# Patient Record
Sex: Female | Born: 1960 | Race: White | Hispanic: No | Marital: Married | Smoking: Never smoker
Health system: Southern US, Community
[De-identification: ages and names within clinical notes are randomized; demographics above are authoritative.]

## PROBLEM LIST (undated history)

## (undated) DIAGNOSIS — C801 Malignant (primary) neoplasm, unspecified: Secondary | ICD-10-CM

## (undated) DIAGNOSIS — H35349 Macular cyst, hole, or pseudohole, unspecified eye: Secondary | ICD-10-CM

## (undated) DIAGNOSIS — Z9889 Other specified postprocedural states: Secondary | ICD-10-CM

## (undated) DIAGNOSIS — E039 Hypothyroidism, unspecified: Secondary | ICD-10-CM

## (undated) DIAGNOSIS — K8689 Other specified diseases of pancreas: Secondary | ICD-10-CM

## (undated) DIAGNOSIS — R112 Nausea with vomiting, unspecified: Secondary | ICD-10-CM

## (undated) DIAGNOSIS — H269 Unspecified cataract: Secondary | ICD-10-CM

## (undated) DIAGNOSIS — Z800 Family history of malignant neoplasm of digestive organs: Secondary | ICD-10-CM

## (undated) DIAGNOSIS — K5790 Diverticulosis of intestine, part unspecified, without perforation or abscess without bleeding: Secondary | ICD-10-CM

## (undated) HISTORY — DX: Family history of malignant neoplasm of digestive organs: Z80.0

## (undated) HISTORY — DX: Macular cyst, hole, or pseudohole, unspecified eye: H35.349

## (undated) HISTORY — PX: CATARACT EXTRACTION, BILATERAL: SHX1313

## (undated) HISTORY — PX: TONSILLECTOMY: SHX5217

## (undated) HISTORY — DX: Hypothyroidism, unspecified: E03.9

## (undated) HISTORY — PX: EYE SURGERY: SHX253

## (undated) HISTORY — PX: COLONOSCOPY: SHX174

## (undated) HISTORY — DX: Diverticulosis of intestine, part unspecified, without perforation or abscess without bleeding: K57.90

## (undated) HISTORY — PX: OTHER SURGICAL HISTORY: SHX169

## (undated) HISTORY — DX: Unspecified cataract: H26.9

## (undated) HISTORY — PX: TONSILLECTOMY: SUR1361

---

## 1997-11-25 ENCOUNTER — Ambulatory Visit (HOSPITAL_COMMUNITY): Admission: RE | Admit: 1997-11-25 | Discharge: 1997-11-25 | Payer: Self-pay | Admitting: *Deleted

## 2001-08-15 ENCOUNTER — Encounter: Payer: Self-pay | Admitting: Family Medicine

## 2001-08-15 ENCOUNTER — Encounter: Admission: RE | Admit: 2001-08-15 | Discharge: 2001-08-15 | Payer: Self-pay | Admitting: Family Medicine

## 2001-12-07 DIAGNOSIS — K5790 Diverticulosis of intestine, part unspecified, without perforation or abscess without bleeding: Secondary | ICD-10-CM

## 2001-12-07 HISTORY — DX: Diverticulosis of intestine, part unspecified, without perforation or abscess without bleeding: K57.90

## 2002-08-16 ENCOUNTER — Encounter: Payer: Self-pay | Admitting: Family Medicine

## 2002-08-16 ENCOUNTER — Encounter: Admission: RE | Admit: 2002-08-16 | Discharge: 2002-08-16 | Payer: Self-pay | Admitting: Family Medicine

## 2005-10-12 ENCOUNTER — Other Ambulatory Visit: Admission: RE | Admit: 2005-10-12 | Discharge: 2005-10-12 | Payer: Self-pay | Admitting: *Deleted

## 2005-10-24 ENCOUNTER — Ambulatory Visit (HOSPITAL_COMMUNITY): Admission: RE | Admit: 2005-10-24 | Discharge: 2005-10-24 | Payer: Self-pay | Admitting: Family Medicine

## 2005-11-23 ENCOUNTER — Ambulatory Visit (HOSPITAL_COMMUNITY): Admission: RE | Admit: 2005-11-23 | Discharge: 2005-11-23 | Payer: Self-pay | Admitting: Family Medicine

## 2006-10-16 ENCOUNTER — Other Ambulatory Visit: Admission: RE | Admit: 2006-10-16 | Discharge: 2006-10-16 | Payer: Self-pay | Admitting: Family Medicine

## 2006-11-09 ENCOUNTER — Encounter: Admission: RE | Admit: 2006-11-09 | Discharge: 2006-11-09 | Payer: Self-pay | Admitting: Family Medicine

## 2007-10-01 DIAGNOSIS — E039 Hypothyroidism, unspecified: Secondary | ICD-10-CM

## 2007-10-01 HISTORY — DX: Hypothyroidism, unspecified: E03.9

## 2007-10-18 ENCOUNTER — Other Ambulatory Visit: Admission: RE | Admit: 2007-10-18 | Discharge: 2007-10-18 | Payer: Self-pay | Admitting: Gynecology

## 2007-11-12 ENCOUNTER — Encounter: Admission: RE | Admit: 2007-11-12 | Discharge: 2007-11-12 | Payer: Self-pay | Admitting: Gynecology

## 2008-04-22 ENCOUNTER — Ambulatory Visit: Payer: Self-pay | Admitting: Gynecology

## 2008-06-03 ENCOUNTER — Ambulatory Visit: Payer: Self-pay | Admitting: Gynecology

## 2008-06-11 ENCOUNTER — Ambulatory Visit (HOSPITAL_COMMUNITY): Admission: RE | Admit: 2008-06-11 | Discharge: 2008-06-11 | Payer: Self-pay | Admitting: Gynecology

## 2008-10-28 ENCOUNTER — Ambulatory Visit: Payer: Self-pay | Admitting: Gynecology

## 2008-10-28 ENCOUNTER — Other Ambulatory Visit: Admission: RE | Admit: 2008-10-28 | Discharge: 2008-10-28 | Payer: Self-pay | Admitting: Gynecology

## 2008-10-28 ENCOUNTER — Encounter: Payer: Self-pay | Admitting: Gynecology

## 2008-11-12 ENCOUNTER — Ambulatory Visit (HOSPITAL_COMMUNITY): Admission: RE | Admit: 2008-11-12 | Discharge: 2008-11-12 | Payer: Self-pay | Admitting: Gynecology

## 2009-04-27 ENCOUNTER — Ambulatory Visit: Payer: Self-pay | Admitting: Gynecology

## 2009-04-27 ENCOUNTER — Other Ambulatory Visit: Admission: RE | Admit: 2009-04-27 | Discharge: 2009-04-27 | Payer: Self-pay | Admitting: Gynecology

## 2009-10-29 ENCOUNTER — Other Ambulatory Visit: Admission: RE | Admit: 2009-10-29 | Discharge: 2009-10-29 | Payer: Self-pay | Admitting: Gynecology

## 2009-10-29 ENCOUNTER — Ambulatory Visit: Payer: Self-pay | Admitting: Gynecology

## 2009-11-13 ENCOUNTER — Ambulatory Visit (HOSPITAL_COMMUNITY): Admission: RE | Admit: 2009-11-13 | Discharge: 2009-11-13 | Payer: Self-pay | Admitting: Gynecology

## 2009-11-17 ENCOUNTER — Encounter: Admission: RE | Admit: 2009-11-17 | Discharge: 2009-11-17 | Payer: Self-pay | Admitting: Gynecology

## 2010-11-01 ENCOUNTER — Encounter: Payer: Self-pay | Admitting: Gynecology

## 2010-11-02 ENCOUNTER — Encounter: Payer: Self-pay | Admitting: Gynecology

## 2010-11-08 ENCOUNTER — Encounter (INDEPENDENT_AMBULATORY_CARE_PROVIDER_SITE_OTHER): Payer: BC Managed Care – PPO | Admitting: Gynecology

## 2010-11-08 ENCOUNTER — Other Ambulatory Visit: Payer: Self-pay | Admitting: Gynecology

## 2010-11-08 ENCOUNTER — Other Ambulatory Visit (HOSPITAL_COMMUNITY)
Admission: RE | Admit: 2010-11-08 | Discharge: 2010-11-08 | Disposition: A | Discharge status: Home / Self Care | Payer: BC Managed Care – PPO | Source: Ambulatory Visit | Attending: Gynecology | Admitting: Gynecology

## 2010-11-08 DIAGNOSIS — Z01419 Encounter for gynecological examination (general) (routine) without abnormal findings: Secondary | ICD-10-CM

## 2010-11-08 DIAGNOSIS — Z124 Encounter for screening for malignant neoplasm of cervix: Secondary | ICD-10-CM | POA: Insufficient documentation

## 2010-11-08 DIAGNOSIS — Z13220 Encounter for screening for lipoid disorders: Secondary | ICD-10-CM

## 2010-11-08 DIAGNOSIS — Z1231 Encounter for screening mammogram for malignant neoplasm of breast: Secondary | ICD-10-CM

## 2010-11-08 DIAGNOSIS — E039 Hypothyroidism, unspecified: Secondary | ICD-10-CM

## 2010-11-08 DIAGNOSIS — R82998 Other abnormal findings in urine: Secondary | ICD-10-CM

## 2010-11-19 ENCOUNTER — Ambulatory Visit (HOSPITAL_COMMUNITY): Payer: BC Managed Care – PPO

## 2010-11-30 ENCOUNTER — Ambulatory Visit (HOSPITAL_COMMUNITY): Payer: BC Managed Care – PPO

## 2010-12-01 ENCOUNTER — Ambulatory Visit (HOSPITAL_COMMUNITY)
Admission: RE | Admit: 2010-12-01 | Discharge: 2010-12-01 | Disposition: A | Discharge status: Home / Self Care | Payer: BC Managed Care – PPO | Source: Ambulatory Visit | Attending: Gynecology | Admitting: Gynecology

## 2010-12-01 DIAGNOSIS — Z1231 Encounter for screening mammogram for malignant neoplasm of breast: Secondary | ICD-10-CM | POA: Insufficient documentation

## 2011-07-28 ENCOUNTER — Other Ambulatory Visit: Payer: Self-pay | Admitting: Gynecology

## 2011-10-23 ENCOUNTER — Other Ambulatory Visit: Payer: Self-pay | Admitting: Gynecology

## 2011-10-31 ENCOUNTER — Other Ambulatory Visit: Payer: Self-pay | Admitting: Gynecology

## 2011-10-31 DIAGNOSIS — Z1231 Encounter for screening mammogram for malignant neoplasm of breast: Secondary | ICD-10-CM

## 2011-11-09 ENCOUNTER — Ambulatory Visit (INDEPENDENT_AMBULATORY_CARE_PROVIDER_SITE_OTHER): Payer: BC Managed Care – PPO | Admitting: Gynecology

## 2011-11-09 ENCOUNTER — Encounter: Payer: Self-pay | Admitting: Gynecology

## 2011-11-09 ENCOUNTER — Encounter: Payer: BC Managed Care – PPO | Admitting: Gynecology

## 2011-11-09 VITALS — BP 122/66 | Ht 66.0 in | Wt 140.0 lb

## 2011-11-09 DIAGNOSIS — Z309 Encounter for contraceptive management, unspecified: Secondary | ICD-10-CM

## 2011-11-09 DIAGNOSIS — E039 Hypothyroidism, unspecified: Secondary | ICD-10-CM

## 2011-11-09 DIAGNOSIS — Z01419 Encounter for gynecological examination (general) (routine) without abnormal findings: Secondary | ICD-10-CM

## 2011-11-09 LAB — CBC WITH DIFFERENTIAL/PLATELET
Basophils Absolute: 0 10*3/uL (ref 0.0–0.1)
Hemoglobin: 13.2 g/dL (ref 12.0–15.0)
Lymphocytes Relative: 28 % (ref 12–46)
MCV: 89.7 fL (ref 78.0–100.0)
Monocytes Absolute: 0.6 10*3/uL (ref 0.1–1.0)
Monocytes Relative: 9 % (ref 3–12)
Platelets: 251 10*3/uL (ref 150–400)
RBC: 4.36 MIL/uL (ref 3.87–5.11)
WBC: 6.4 10*3/uL (ref 4.0–10.5)

## 2011-11-09 LAB — FOLLICLE STIMULATING HORMONE: FSH: 22.7 m[IU]/mL

## 2011-11-09 MED ORDER — LEVOTHYROXINE SODIUM 50 MCG PO TABS: 50.0000 ug | ORAL_TABLET | Freq: Every day | ORAL | Status: DC

## 2011-11-09 MED ORDER — NORGESTIMATE-ETH ESTRADIOL 0.25-35 MG-MCG PO TABS: 1.0000 | ORAL_TABLET | Freq: Every day | ORAL | Status: DC

## 2011-11-09 NOTE — Progress Notes (Addendum)
Breanna Noble 14-Jun-1960 [id number]        51 y.o.  for annual exam.  Doing well without complaints.  Past medical history,surgical history, medications, allergies, family history and social history were all reviewed and documented in the EPIC chart. ROS:  Was performed and pertinent positives and negatives are included in the history.  Exam: Breanna Noble assistant Filed Vitals:   11/09/11 1131  BP: 122/66   General appearance  Normal Skin grossly normal Head/Neck normal with no cervical or supraclavicular adenopathy thyroid normal Lungs  clear Cardiac RR, without RMG Abdominal  soft, nontender, without masses, organomegaly or hernia Breasts  examined lying and sitting without masses, retractions, discharge or axillary adenopathy. Pelvic  Ext/BUS/vagina  normal   Cervix  normal   Uterus  anteverted, normal size, shape and contour, midline and mobile nontender   Adnexa  Without masses or tenderness    Anus and perineum  normal   Rectovaginal  normal sphincter tone without palpated masses or tenderness.    Assessment/Plan:  51 y.o. female for annual exam.    1. Contraceptive management. Patient continues to have regular menses with birth control pills. She's not having any menopausal symptoms to include no hot flashes/night sweats/sleep disturbances during her pill free week. Options for stopping oral contraceptives now using backup contraception and monitoring versus switching to a different contraception such as IUD versus continuing on oral contraceptives reviewed. Risks to include stroke, heart attack, DVT discussed. Patient wants to continue on her oral contraceptives now and I refilled her times a year. I am going to check an [facility] now, assuming normal plan discontinuing next year at age 34 and going from there. 2. Hypothyroidism. Patient's on Synthroid 50 mcg daily. We'll check TSH and refill her times a year. 3. Pap smear. Last Pap smear 2012. No Pap smear done today. Patient has no history  of abnormal Pap smears with multiple normal reports in her chart. Reviewed current screening guidelines we'll plan plan less frequent screening every 3-5 years. 4. Mammography. Patient has her mammogram scheduled next month. She'll continue with annual mammography. SBE monthly reviewed. 5. Colonoscopy. Patient turns 51 this year has never had colonoscopy. Recommended screening colonoscopy. Her husband sees Dr. Evette Cristal I recommended she call his office to arrange colonoscopy and she agrees to do so. 6. Health maintenance. Will check baseline CBC, urinalysis along with her FSH and TSH. A glucose and lipid profile were normal 2012 and I did not repeat those this year.    Breanna Lords MD, 12:11 PM 11/09/2011

## 2011-11-09 NOTE — Patient Instructions (Signed)
Follow up in one year for annual gynecologic exam. 

## 2011-11-10 LAB — URINALYSIS W MICROSCOPIC + REFLEX CULTURE
Bacteria, UA: NONE SEEN
Crystals: NONE SEEN
Glucose, UA: NEGATIVE mg/dL
Hgb urine dipstick: NEGATIVE
Ketones, ur: NEGATIVE mg/dL
Leukocytes, UA: NEGATIVE
Squamous Epithelial / LPF: NONE SEEN
pH: 7 (ref 5.0–8.0)

## 2011-12-02 ENCOUNTER — Ambulatory Visit (HOSPITAL_COMMUNITY)
Admission: RE | Admit: 2011-12-02 | Discharge: 2011-12-02 | Disposition: A | Discharge status: Home / Self Care | Payer: BC Managed Care – PPO | Source: Ambulatory Visit | Attending: Gynecology | Admitting: Gynecology

## 2011-12-02 DIAGNOSIS — Z1231 Encounter for screening mammogram for malignant neoplasm of breast: Secondary | ICD-10-CM | POA: Insufficient documentation

## 2012-06-08 ENCOUNTER — Encounter: Payer: Self-pay | Admitting: Family Medicine

## 2012-09-25 ENCOUNTER — Telehealth: Payer: Self-pay | Admitting: Family Medicine

## 2012-10-09 NOTE — Telephone Encounter (Signed)
Error

## 2012-10-17 ENCOUNTER — Other Ambulatory Visit: Payer: Self-pay | Admitting: Gynecology

## 2012-11-09 ENCOUNTER — Encounter: Payer: Self-pay | Admitting: Gynecology

## 2012-11-09 ENCOUNTER — Ambulatory Visit (INDEPENDENT_AMBULATORY_CARE_PROVIDER_SITE_OTHER): Payer: BC Managed Care – PPO | Admitting: Gynecology

## 2012-11-09 VITALS — BP 122/74 | Ht 65.0 in | Wt 137.0 lb

## 2012-11-09 DIAGNOSIS — Z01419 Encounter for gynecological examination (general) (routine) without abnormal findings: Secondary | ICD-10-CM

## 2012-11-09 DIAGNOSIS — Z309 Encounter for contraceptive management, unspecified: Secondary | ICD-10-CM

## 2012-11-09 MED ORDER — NORGESTIMATE-ETH ESTRADIOL 0.25-35 MG-MCG PO TABS: ORAL_TABLET | ORAL | Status: DC

## 2012-11-09 NOTE — Progress Notes (Signed)
Breanna Noble 1960/08/18 [id number]        52 y.o.  G2P2 for annual exam.  Doing well without complaints.  Past medical history,surgical history, medications, allergies, family history and social history were all reviewed and documented in the EPIC chart.  ROS:  Performed and pertinent positives and negatives are included in the history, assessment and plan .  Exam: Biomedical scientist Filed Vitals:   11/09/12 0942  BP: 122/74  Height: 5\' 5"  (1.651 m)  Weight: 137 lb (62.143 kg)   General appearance  Normal Skin grossly normal Head/Neck normal with no cervical or supraclavicular adenopathy thyroid normal Lungs  clear Cardiac RR, without RMG Abdominal  soft, nontender, without masses, organomegaly or hernia Breasts  examined lying and sitting without masses, retractions, discharge or axillary adenopathy. Pelvic  Ext/BUS/vagina  normal   Cervix  normal   Uterus  anteverted, normal size, shape and contour, midline and mobile nontender   Adnexa  Without masses or tenderness    Anus and perineum  normal   Rectovaginal  normal sphincter tone without palpated masses or tenderness.    Assessment/Plan:  52 y.o. G2P2 female for annual exam.   1. Contraceptive management. Patient continues on oral contraceptives with regular menses. No hot flushes night sweats or other menopausal symptoms during her pill free week. Options for management include stopping now and monitoring menses rechecking FSH. Rechecking FSH during pill free week. Continuing on BCPs one more year and then addressing next year. The risks of BCPs with advancing age to include stroke heart attack DVT discussed. She's not been followed for medical issues such as hypertension diabetes. Patient would prefer to continue for another year and then address at that time which I think is reasonable. I refilled her pills times a year. 2. Mammography due next month. Patient is to schedule. SBE monthly reviewed. 3. Pap smear 2012. No Pap smear  done today. No history of abnormal Pap smears previously. Plan repeat next year a 3 year interval. 4. Health maintenance. Patient recently saw her primary physician for an annual exam who has taken over her thyroid management. Recent TSH was normal. He also ordered routine blood work and no blood was done today. Followup in one year, sooner as needed.  Note: This document was prepared with digital dictation and possible smart phrase technology. Any transcriptional errors that result from this process are unintentional.   Dara Lords MD, 10:10 AM 11/09/2012

## 2012-11-09 NOTE — Patient Instructions (Signed)
Follow up in one year, sooner as needed. 

## 2012-11-12 ENCOUNTER — Other Ambulatory Visit: Payer: Self-pay | Admitting: Gynecology

## 2012-11-12 DIAGNOSIS — Z1231 Encounter for screening mammogram for malignant neoplasm of breast: Secondary | ICD-10-CM

## 2012-12-03 ENCOUNTER — Other Ambulatory Visit: Payer: Self-pay | Admitting: Gynecology

## 2012-12-05 ENCOUNTER — Ambulatory Visit (HOSPITAL_COMMUNITY)
Admission: RE | Admit: 2012-12-05 | Discharge: 2012-12-05 | Disposition: A | Discharge status: Home / Self Care | Payer: BC Managed Care – PPO | Source: Ambulatory Visit | Attending: Gynecology | Admitting: Gynecology

## 2012-12-05 DIAGNOSIS — Z1231 Encounter for screening mammogram for malignant neoplasm of breast: Secondary | ICD-10-CM | POA: Insufficient documentation

## 2012-12-10 ENCOUNTER — Ambulatory Visit (INDEPENDENT_AMBULATORY_CARE_PROVIDER_SITE_OTHER): Payer: BC Managed Care – PPO | Admitting: Family Medicine

## 2012-12-10 ENCOUNTER — Encounter: Payer: Self-pay | Admitting: Family Medicine

## 2012-12-10 VITALS — BP 115/65 | HR 56 | Temp 97.9°F | Ht 65.5 in | Wt 137.6 lb

## 2012-12-10 DIAGNOSIS — K5790 Diverticulosis of intestine, part unspecified, without perforation or abscess without bleeding: Secondary | ICD-10-CM | POA: Insufficient documentation

## 2012-12-10 DIAGNOSIS — E039 Hypothyroidism, unspecified: Secondary | ICD-10-CM

## 2012-12-10 DIAGNOSIS — Z0000 Encounter for general adult medical examination without abnormal findings: Secondary | ICD-10-CM

## 2012-12-10 DIAGNOSIS — K5730 Diverticulosis of large intestine without perforation or abscess without bleeding: Secondary | ICD-10-CM

## 2012-12-10 DIAGNOSIS — Z119 Encounter for screening for infectious and parasitic diseases, unspecified: Secondary | ICD-10-CM | POA: Insufficient documentation

## 2012-12-10 NOTE — Patient Instructions (Addendum)
HEALTH MAINTENANCE Immunizations: Tetanus-Diphtheria Booster [phone] Pertusis Booster [phone] Flu Shot Due: every Fall Pneumonia Vaccine: usually at 51 years of age unless there are certain risk situations. Herpes Zoster/Shingles Vaccine due: usually at 51 years of age HPV XBJ:YNWG age 89 to 37 years in males and females.  Healthy Life Habits: Exercise Goal: 5-6 days/week; start gradually(ie 30 minutes/3days per week) Nutrition: Balanced healthy meals including Vegetables and Fruits. Consider  Reading the following books: 1) Eat to Live by Dr Ottis Stain; 2) Prevent and Reverse Heart Disease by Dr Suzzette Righter.  Vitamins:okay Aspirin:81 mg Stop Tobacco Use:try to avoid second hand  Smoke. Seat Belt Use:+++ recommended Sunscreen Use:+++ recommended Osteoporosis Prevention: 1) Exercise 2) Calcium/Vitamin D requirements:he [employer] of the [employer] recommends:    Calcium:  800 mg/day for children 45-16 years of age          51 mg/day for children 87-61 years of age          51 mg/day for adults 71-44 years of age          51 mg/day for everyone more than 51 years of age     Vitamin D: 800 IU per day or as prescribed if you are deficient.  Recommended Screening Tests: Colon Cancer Screening: done 2013 next is 2023 Blood work: today Cholesterol Screening:today            HIV: n/a                   Hepatitis C(people born 1945-1965): today  Mammogram: last Wednesday DEXA/Bone Density: at 56 years with the Gynecologist GYN Exam:with Dr Audie Box Monthly Self Breast Exam:++++  Eye Exam: every 1 to 2 years recommended Dental Health: at least every 6 months  Others:    Living Will/Healthcare Power of Attorney: should have this in order with your personal estate planning

## 2012-12-10 NOTE — Progress Notes (Signed)
Patient ID: Breanna Noble, female   DOB: 02-06-1961, 52 y.o.   MRN: [id number] SUBJECTIVE: CC: Chief Complaint  Patient presents with  . Annual Exam    cpx no pap  never had cxr but around people who have smoked     HPI: Second hand smoke. Gyn Dr Audie Box in [place]. Had her GYN exam. Mammogram last Wednesday.  Past Medical History  Diagnosis Date  . Hypothyroid 10/2007  . Diverticulosis 12/07/2001    found on colonoscopy   Past Surgical History  Procedure Laterality Date  . Tonsillectomy     History   Social History  . Marital Status: Married    Spouse Name: N/A    Number of Children: N/A  . Years of Education: N/A   Occupational History  . Not on file.   Social History Main Topics  . Smoking status: Never Smoker   . Smokeless tobacco: Not on file  . Alcohol Use: No  . Drug Use: No  . Sexually Active: Yes    Birth Control/ Protection: Pill   Other Topics Concern  . Not on file   Social History Narrative  . No narrative on file   Family History  Problem Relation Age of Onset  . Hypertension Father   . Heart disease Father   . Cancer Maternal Grandmother     PANCREATIC  . Heart disease Maternal Grandfather   . Diabetes Paternal Grandfather   . Cancer Paternal Uncle     COLON CANCER IN HIS 50'S   Current Outpatient Prescriptions on File Prior to Visit  Medication Sig Dispense Refill  . Ascorbic Acid (VITAMIN C PO) Take by mouth. 1000MG  A DAY      . aspirin 81 MG tablet Take 81 mg by mouth daily.        . fish oil-omega-3 fatty acids 1000 MG capsule Take 2 g by mouth daily.      Marland Kitchen levothyroxine (SYNTHROID, LEVOTHROID) 50 MCG tablet Take 1 tablet (50 mcg total) by mouth daily.  90 tablet  4  . norgestimate-ethinyl estradiol (SPRINTEC 28) 0.25-35 MG-MCG tablet TAKE ONE TABLET BY MOUTH EVERY DAY  1 Package  11  . Cholecalciferol (VITAMIN D PO) Take by mouth. 2000 UNITS A DAY      . Multiple Vitamin (MULTIVITAMIN PO) Take by mouth.         No current  facility-administered medications on file prior to visit.   No Known Allergies Immunization History  Administered Date(s) Administered  . Tdap 01/29/2011   Prior to Admission medications   Medication Sig Start Date End Date Taking? Authorizing Provider  Ascorbic Acid (VITAMIN C PO) Take by mouth. 1000MG  A DAY   Yes Historical Provider, MD  aspirin 81 MG tablet Take 81 mg by mouth daily.     Yes Historical Provider, MD  fish oil-omega-3 fatty acids 1000 MG capsule Take 2 g by mouth daily.   Yes Historical Provider, MD  levothyroxine (SYNTHROID, LEVOTHROID) 50 MCG tablet Take 1 tablet (50 mcg total) by mouth daily. 11/09/11  Yes Dara Lords, MD  norgestimate-ethinyl estradiol (SPRINTEC 28) 0.25-35 MG-MCG tablet TAKE ONE TABLET BY MOUTH EVERY DAY 11/09/12  Yes Dara Lords, MD  Cholecalciferol (VITAMIN D PO) Take by mouth. 2000 UNITS A DAY    Historical Provider, MD  Multiple Vitamin (MULTIVITAMIN PO) Take by mouth.      Historical Provider, MD     ROS: As above in the HPI. All other systems are stable or  negative.  OBJECTIVE: APPEARANCE:  Patient in no acute distress.The patient appeared well nourished and normally developed. Acyanotic. Waist: VITAL SIGNS:BP 115/65  Pulse 56  Temp(Src) 97.9 F (36.6 C) (Other (Comment))  Ht 5' 5.5" (1.664 m)  Wt 137 lb 9.6 oz (62.415 kg)  BMI 22.54 kg/m2  LMP 11/23/2012  WF looks well  SKIN: warm and  Dry without overt rashes, tattoos and scars  HEAD and Neck: without JVD, Head and scalp: normal Eyes:No scleral icterus. Fundi normal, eye movements normal. Ears: Auricle normal, canal normal, Tympanic membranes normal, insufflation normal. Nose: normal Throat: normal Neck & thyroid: normal  CHEST & LUNGS: Chest wall: normal Lungs: Clear  CVS: Reveals the PMI to be normally located. Regular rhythm, First and Second Heart sounds are normal,  absence of murmurs, rubs or gallops. Peripheral vasculature: Radial pulses:  normal Dorsal pedis pulses: normal Posterior pulses: normal  ABDOMEN:  Appearance: normal Benign, no organomegaly, no masses, no Abdominal Aortic enlargement. No Guarding , no rebound. No Bruits. Bowel sounds: normal  RECTAL: N/A GU: N/A  EXTREMETIES: nonedematous. Both Femoral and Pedal pulses are normal.  MUSCULOSKELETAL:  Spine: normal Joints: intact  NEUROLOGIC: oriented to time,place and person; nonfocal. Strength is normal Sensory is normal Reflexes are normal Cranial Nerves are normal.  ASSESSMENT: Annual physical exam - Plan: CMP14+EGFR, Lipid panel, CBC With differential/Platelet  Hypothyroid - Plan: Thyroid Panel With TSH  Diverticulosis - history of  Screening examination for infectious disease - Plan: Hepatitis C antibody  In good state of health  PLAN:       HEALTH MAINTENANCE Immunizations: Tetanus-Diphtheria Booster [id number] Pertusis Booster [id number] Flu Shot Due: every Fall Pneumonia Vaccine: usually at 51 years of age unless there are certain risk situations. Herpes Zoster/Shingles Vaccine due: usually at 51 years of age HPV XBJ:YNWG age 22 to 62 years in males and females.  Healthy Life Habits: Exercise Goal: 5-6 days/week; start gradually(ie 30 minutes/3days per week) Nutrition: Balanced healthy meals including Vegetables and Fruits. Consider  Reading the following books: 1) Eat to Live by Dr Ottis Stain; 2) Prevent and Reverse Heart Disease by Dr Suzzette Righter.  Vitamins:okay Aspirin:81 mg Stop Tobacco Use:try to avoid second hand  Smoke. Seat Belt Use:+++ recommended Sunscreen Use:+++ recommended Osteoporosis Prevention: 1) Exercise 2) Calcium/Vitamin D requirements:he [employer] of the [employer] recommends:    Calcium:  800 mg/day for children 39-80 years of age          51 mg/day for children 62-66 years of age          51 mg/day for adults 44-39 years of age          51 mg/day for everyone  more than 51 years of age     Vitamin D: 800 IU per day or as prescribed if you are deficient.  Recommended Screening Tests: Colon Cancer Screening: done 2013 next is 2023 Blood work: today Cholesterol Screening:today            HIV: n/a                   Hepatitis C(people born 1945-1965): today  Mammogram: last Wednesday DEXA/Bone Density: at 56 years with the Gynecologist GYN Exam:with Dr Audie Box Monthly Self Breast Exam:++++  Eye Exam: every 1 to 2 years recommended Dental Health: at least every 6 months  Others:    Living Will/Healthcare Power of Attorney: should have this in order with your personal estate planning   Orders  Placed This Encounter  Procedures  . CMP14+EGFR  . Thyroid Panel With TSH  . Lipid panel  . CBC With differential/Platelet  . Hepatitis C antibody   Return in about 6 months (around 06/12/2013) for recheck thyroid.  Juliany Daughety P. [place] Charon, M.D.

## 2012-12-11 LAB — CBC WITH DIFFERENTIAL
Basophils Absolute: 0 10*3/uL (ref 0.0–0.2)
Basos: 0 % (ref 0–3)
Eos: 1 % (ref 0–5)
Eosinophils Absolute: 0.1 10*3/uL (ref 0.0–0.4)
HCT: 41.1 % (ref 34.0–46.6)
Hemoglobin: 14 g/dL (ref 11.1–15.9)
Immature Grans (Abs): 0 10*3/uL (ref 0.0–0.1)
Immature Granulocytes: 0 % (ref 0–2)
Lymphocytes Absolute: 2.1 10*3/uL (ref 0.7–3.1)
Lymphs: 31 % (ref 14–46)
MCH: 30.4 pg (ref 26.6–33.0)
MCHC: 34.1 g/dL (ref 31.5–35.7)
MCV: 89 fL (ref 79–97)
Monocytes Absolute: 0.6 10*3/uL (ref 0.1–0.9)
Monocytes: 8 % (ref 4–12)
Neutrophils Absolute: 4.2 10*3/uL (ref 1.4–7.0)
Neutrophils Relative %: 60 % (ref 40–74)
Platelets: 278 10*3/uL (ref 150–379)
RBC: 4.6 x10E6/uL (ref 3.77–5.28)
RDW: 14 % (ref 12.3–15.4)
WBC: 7 10*3/uL (ref 3.4–10.8)

## 2012-12-11 LAB — LIPID PANEL
Chol/HDL Ratio: 2.9 ratio units (ref 0.0–4.4)
Cholesterol, Total: 177 mg/dL (ref 100–199)
HDL: 62 mg/dL (ref >39)
LDL Calculated: 86 mg/dL (ref 0–99)
Triglycerides: 147 mg/dL (ref 0–149)
VLDL Cholesterol Cal: 29 mg/dL (ref 5–40)

## 2012-12-11 LAB — CMP14+EGFR
ALT: 18 IU/L (ref 0–32)
AST: 19 IU/L (ref 0–40)
Albumin/Globulin Ratio: 1.9 (ref 1.1–2.5)
Albumin: 4.5 g/dL (ref 3.5–5.5)
Alkaline Phosphatase: 41 IU/L (ref 39–117)
BUN/Creatinine Ratio: 9 (ref 9–23)
BUN: 6 mg/dL (ref 6–24)
CO2: 25 mmol/L (ref 18–29)
Calcium: 9.3 mg/dL (ref 8.7–10.2)
Chloride: 99 mmol/L (ref 97–108)
Creatinine, Ser: 0.66 mg/dL (ref 0.57–1.00)
GFR calc Af Amer: 118 mL/min/1.73
GFR calc non Af Amer: 103 mL/min/1.73
Globulin, Total: 2.4 g/dL (ref 1.5–4.5)
Glucose: 89 mg/dL (ref 65–99)
Potassium: 4.4 mmol/L (ref 3.5–5.2)
Sodium: 139 mmol/L (ref 134–144)
Total Bilirubin: 0.4 mg/dL (ref 0.0–1.2)
Total Protein: 6.9 g/dL (ref 6.0–8.5)

## 2012-12-11 LAB — THYROID PANEL WITH TSH
Free Thyroxine Index: 2.6 (ref 1.2–4.9)
T3 Uptake Ratio: 22 % — ABNORMAL LOW (ref 24–39)
T4, Total: 12 ug/dL (ref 4.5–12.0)
TSH: 3.47 u[IU]/mL (ref 0.450–4.500)

## 2012-12-11 LAB — HEPATITIS C ANTIBODY: Hep C Virus Ab: <0.1 s/co ratio (ref 0.0–0.9)

## 2012-12-12 NOTE — Progress Notes (Signed)
Quick Note:  Call patient. Labs normal. No change in plan. ______ 

## 2012-12-13 ENCOUNTER — Encounter: Payer: BC Managed Care – PPO | Admitting: Family Medicine

## 2013-01-01 ENCOUNTER — Other Ambulatory Visit: Payer: Self-pay | Admitting: Family Medicine

## 2013-01-01 DIAGNOSIS — E039 Hypothyroidism, unspecified: Secondary | ICD-10-CM

## 2013-01-01 MED ORDER — LEVOTHYROXINE SODIUM 50 MCG PO TABS: 50.0000 ug | ORAL_TABLET | Freq: Every day | ORAL | Status: DC

## 2013-03-07 ENCOUNTER — Other Ambulatory Visit: Payer: Self-pay

## 2013-06-13 ENCOUNTER — Encounter: Payer: Self-pay | Admitting: Family Medicine

## 2013-06-13 ENCOUNTER — Ambulatory Visit (INDEPENDENT_AMBULATORY_CARE_PROVIDER_SITE_OTHER): Payer: BC Managed Care – PPO | Admitting: Family Medicine

## 2013-06-13 VITALS — BP 98/64 | HR 67 | Temp 98.3°F | Ht 65.5 in | Wt 134.8 lb

## 2013-06-13 DIAGNOSIS — H6980 Other specified disorders of Eustachian tube, unspecified ear: Secondary | ICD-10-CM

## 2013-06-13 DIAGNOSIS — E039 Hypothyroidism, unspecified: Secondary | ICD-10-CM

## 2013-06-13 DIAGNOSIS — H6990 Unspecified Eustachian tube disorder, unspecified ear: Secondary | ICD-10-CM

## 2013-06-13 MED ORDER — LEVOTHYROXINE SODIUM 50 MCG PO TABS: 50.0000 ug | ORAL_TABLET | Freq: Every day | ORAL | Status: AC

## 2013-06-13 NOTE — Progress Notes (Signed)
Patient ID: Breanna Noble, female   DOB: 14-Jun-1960, 53 y.o.   MRN: [id number] SUBJECTIVE: CC: Chief Complaint  Patient presents with  . Follow-up    no complaints ck right ear refill thyroid med wants 90 day supply    HPI: Here to recheck her thyroid levels. Doing fine and feeling fine and is vegetarian. The right ear drum pops at times and is stuffed up.   Past Medical History  Diagnosis Date  . Hypothyroid 10/2007  . Diverticulosis 12/07/2001    found on colonoscopy   Past Surgical History  Procedure Laterality Date  . Tonsillectomy     History   Social History  . Marital Status: Married    Spouse Name: N/A    Number of Children: N/A  . Years of Education: N/A   Occupational History  . Not on file.   Social History Main Topics  . Smoking status: Never Smoker   . Smokeless tobacco: Not on file  . Alcohol Use: No  . Drug Use: No  . Sexual Activity: Yes    Birth Control/ Protection: Pill   Other Topics Concern  . Not on file   Social History Narrative  . No narrative on file   Family History  Problem Relation Age of Onset  . Hypertension Father   . Heart disease Father   . Cancer Maternal Grandmother     PANCREATIC  . Heart disease Maternal Grandfather   . Diabetes Paternal Grandfather   . Cancer Paternal Uncle     COLON CANCER IN HIS 50'S   Current Outpatient Prescriptions on File Prior to Visit  Medication Sig Dispense Refill  . Ascorbic Acid (VITAMIN C PO) Take by mouth. 1000MG  A DAY      . aspirin 81 MG tablet Take 81 mg by mouth daily.        . Cholecalciferol (VITAMIN D PO) Take by mouth. 2000 UNITS A DAY      . fish oil-omega-3 fatty acids 1000 MG capsule Take 2 g by mouth daily.      . Multiple Vitamin (MULTIVITAMIN PO) Take by mouth.        . norgestimate-ethinyl estradiol (SPRINTEC 28) 0.25-35 MG-MCG tablet TAKE ONE TABLET BY MOUTH EVERY DAY  1 Package  11   No current facility-administered medications on file prior to visit.   No Known  Allergies Immunization History  Administered Date(s) Administered  . Tdap 01/29/2011   Prior to Admission medications   Medication Sig Start Date End Date Taking? Authorizing Provider  Ascorbic Acid (VITAMIN C PO) Take by mouth. 1000MG  A DAY   Yes Historical Provider, MD  aspirin 81 MG tablet Take 81 mg by mouth daily.     Yes Historical Provider, MD  Cholecalciferol (VITAMIN D PO) Take by mouth. 2000 UNITS A DAY   Yes Historical Provider, MD  fish oil-omega-3 fatty acids 1000 MG capsule Take 2 g by mouth daily.   Yes Historical Provider, MD  levothyroxine (SYNTHROID, LEVOTHROID) 50 MCG tablet Take 1 tablet (50 mcg total) by mouth daily. 01/01/13  Yes Vernie Shanks, MD  Multiple Vitamin (MULTIVITAMIN PO) Take by mouth.     Yes Historical Provider, MD  norgestimate-ethinyl estradiol ([place] 28) 0.25-35 MG-MCG tablet TAKE ONE TABLET BY MOUTH EVERY DAY 11/09/12  Yes Timothy P Fontaine, MD     ROS: As above in the HPI. All other systems are stable or negative.  OBJECTIVE: APPEARANCE:  Patient in no acute distress.The patient appeared well nourished  and normally developed. Acyanotic. Waist: VITAL SIGNS:BP 98/64  Pulse 67  Temp(Src) 98.3 F (36.8 C) (Oral)  Ht 5' 5.5" (1.664 m)  Wt 134 lb 12.8 oz (61.145 kg)  BMI 22.08 kg/m2  LMP 06/07/2013  WF looks well  SKIN: warm and  Dry without overt rashes, tattoos and scars  HEAD and Neck: without JVD, Head and scalp: normal Eyes:No scleral icterus. Fundi normal, eye movements normal. Ears: Auricle normal, canal normal, Tympanic membranes normal, insufflation poor on the right. Nose: normal Throat: normal Neck & thyroid: normal  CHEST & LUNGS: Chest wall: normal Lungs: Clear  CVS: Reveals the PMI to be normally located. Regular rhythm, First and Second Heart sounds are normal,  absence of murmurs, rubs or gallops. Peripheral vasculature: Radial pulses: normal Dorsal pedis pulses: normal Posterior pulses: normal  ABDOMEN:   Appearance: normal Benign, no organomegaly, no masses, no Abdominal Aortic enlargement. No Guarding , no rebound. No Bruits. Bowel sounds: normal  RECTAL: N/A GU: N/A  EXTREMETIES: nonedematous.  MUSCULOSKELETAL:  Spine: normal Joints: intact  NEUROLOGIC: oriented to time,place and person; nonfocal. Strength is normal Sensory is normal Reflexes are normal Cranial Nerves are normal.  ASSESSMENT:  Hypothyroid - Plan: levothyroxine (SYNTHROID, LEVOTHROID) 50 MCG tablet, TSH  ETD (eustachian tube dysfunction)  PLAN: Handout on ET dysfunction. Try chewing gum, etc.to relieve ear pressure.  Orders Placed This Encounter  Procedures  . TSH   Meds ordered this encounter  Medications  . levothyroxine (SYNTHROID, LEVOTHROID) 50 MCG tablet    Sig: Take 1 tablet (50 mcg total) by mouth daily.    Dispense:  90 tablet    Refill:  4   Medications Discontinued During This Encounter  Medication Reason  . levothyroxine (SYNTHROID, LEVOTHROID) 50 MCG tablet Reorder   Return in about 6 months (around 12/11/2013) for Recheck medical problems.  Alsha Meland P. Jacelyn Grip, M.D.

## 2013-06-13 NOTE — Patient Instructions (Signed)
Barotitis Media  Barotitis media is inflammation of your middle ear. This occurs when the auditory tube (eustachian tube) leading from the back of your nose (nasopharynx) to your eardrum is blocked. This blockage may result from a cold, environmental allergies, or an upper respiratory infection. Unresolved barotitis media may lead to damage or hearing loss (barotrauma), which may become permanent.  HOME CARE INSTRUCTIONS   · Use medicines as recommended by your health care provider. Over-the-counter medicines will help unblock the canal and can help during times of air travel.  · Do not put anything into your ears to clean or unplug them. Eardrops will not be helpful.  · Do not swim, dive, or fly until your health care provider says it is all right to do so. If these activities are necessary, chewing gum with frequent, forceful swallowing may help. It is also helpful to hold your nose and gently blow to pop your ears for equalizing pressure changes. This forces air into the eustachian tube.  · Only take over-the-counter or prescription medicines for pain, discomfort, or fever as directed by your health care provider.  · A decongestant may be helpful in decongesting the middle ear and make pressure equalization easier.  SEEK MEDICAL CARE IF:  · You experience a serious form of dizziness in which you feel as if the room is spinning and you feel nauseated (vertigo).  · Your symptoms only involve one ear.  SEEK IMMEDIATE MEDICAL CARE IF:   · You develop a severe headache, dizziness, or severe ear pain.  · You have bloody or pus-like drainage from your ears.  · You develop a fever.  · Your problems do not improve or become worse.  MAKE SURE YOU:   · Understand these instructions.  · Will watch your condition.  · Will get help right away if you are not doing well or get worse.  Document Released: 04/15/2000 Document Revised: 02/06/2013 Document Reviewed: 11/13/2012  [employer]® Patient Information ©2014 [employer].

## 2013-06-14 LAB — TSH: TSH: 1.74 u[IU]/mL (ref 0.450–4.500)

## 2013-10-21 ENCOUNTER — Other Ambulatory Visit: Payer: Self-pay | Admitting: Gynecology

## 2013-11-07 ENCOUNTER — Other Ambulatory Visit: Payer: Self-pay | Admitting: Gynecology

## 2013-11-07 DIAGNOSIS — Z1231 Encounter for screening mammogram for malignant neoplasm of breast: Secondary | ICD-10-CM

## 2013-11-18 ENCOUNTER — Ambulatory Visit (INDEPENDENT_AMBULATORY_CARE_PROVIDER_SITE_OTHER): Payer: BC Managed Care – PPO | Admitting: Gynecology

## 2013-11-18 ENCOUNTER — Other Ambulatory Visit (HOSPITAL_COMMUNITY)
Admission: RE | Admit: 2013-11-18 | Discharge: 2013-11-18 | Disposition: A | Discharge status: Home / Self Care | Payer: BC Managed Care – PPO | Source: Ambulatory Visit | Attending: Gynecology | Admitting: Gynecology

## 2013-11-18 ENCOUNTER — Encounter: Payer: Self-pay | Admitting: Gynecology

## 2013-11-18 VITALS — BP 122/76 | Ht 65.75 in | Wt 138.2 lb

## 2013-11-18 DIAGNOSIS — Z3009 Encounter for other general counseling and advice on contraception: Secondary | ICD-10-CM

## 2013-11-18 DIAGNOSIS — Z01419 Encounter for gynecological examination (general) (routine) without abnormal findings: Secondary | ICD-10-CM | POA: Insufficient documentation

## 2013-11-18 DIAGNOSIS — Z1151 Encounter for screening for human papillomavirus (HPV): Secondary | ICD-10-CM | POA: Insufficient documentation

## 2013-11-18 NOTE — Progress Notes (Signed)
Breanna Noble Jun 04, 1960 [id number]        53 y.o.  G2P2 for annual exam.  Several issues noted below.  Past medical history,surgical history, problem list, medications, allergies, family history and social history were all reviewed and documented as reviewed in the EPIC chart.  ROS:  12 system ROS performed with pertinent positives and negatives included in the history, assessment and plan.   Additional significant findings :  None   Exam: Programmer, multimedia Vitals:   11/18/13 0918  BP: 122/76  Height: 5' 5.75" (1.67 m)  Weight: 138 lb 3.2 oz (62.687 kg)   General appearance:  Normal affect, orientation and appearance. Skin: Grossly normal HEENT: Without gross lesions.  No cervical or supraclavicular adenopathy. Thyroid normal.  Lungs:  Clear without wheezing, rales or rhonchi Cardiac: RR, without RMG Abdominal:  Soft, nontender, without masses, guarding, rebound, organomegaly or hernia Breasts:  Examined lying and sitting without masses, retractions, discharge or axillary adenopathy. Pelvic:  Ext/BUS/vagina normal  Cervix normal, Pap/HPV  Uterus anteverted, normal size, shape and contour, midline and mobile nontender   Adnexa  Without masses or tenderness    Anus and perineum  Normal   Rectovaginal  Normal sphincter tone without palpated masses or tenderness.    Assessment/Plan:  53 y.o. G2P2 female for annual exam with regular menses, oral contraceptives.   1. Contraceptive management. Recommended that patient stop her birth control pills now and return in one to 2 months to check an [facility]. Need for backup contraception stressed. If [place] is elevated then we'll continue with backup contraception and keep menstrual history/symptom calendar and will follow. If unacceptable symptoms or irregular bleeding then will represent for evaluation. If [place] is normal then will reinitiate BCPs for another year. Risks associated with BCPs again reviewed to include stroke heart attack DVT.  Possible increased risk with advancing age discussed. Not being followed for any vascular disease. Never smoked. 2. Pap smear 2012. Pap/HPV today. No history of abnormal Pap smears previously. Plan repeat in 3-5 year interval per current screening guidelines. 3. Mammography scheduled next month. SBE monthly reviewed. 4. Colonoscopy 2013. Repeat at their recommended interval. 5. Health maintenance. No routine blood work done as she reports this done through her primary physician's office. Followup one year, sooner as needed.   Note: This document was prepared with digital dictation and possible smart phrase technology. Any transcriptional errors that result from this process are unintentional.   [name], 9:38 AM 11/18/2013

## 2013-11-18 NOTE — Patient Instructions (Signed)
Stop the birth control pills now. Use backup contraception. Return in one to 2 months for blood work to see if we are not into menopause.  You may obtain a copy of any labs that were done today by logging onto MyChart as outlined in the instructions provided with your AVS (after visit summary). The office will not call with normal lab results but certainly if there are any significant abnormalities then we will contact you.   Health Maintenance, Female A healthy lifestyle and preventative care can promote health and wellness.  Maintain regular health, dental, and eye exams.  Eat a healthy diet. Foods like vegetables, fruits, whole grains, low-fat dairy products, and lean protein foods contain the nutrients you need without too many calories. Decrease your intake of foods high in solid fats, added sugars, and salt. Get information about a proper diet from your caregiver, if necessary.  Regular physical exercise is one of the most important things you can do for your health. Most adults should get at least 150 minutes of moderate-intensity exercise (any activity that increases your heart rate and causes you to sweat) each week. In addition, most adults need muscle-strengthening exercises on 2 or more days a week.   Maintain a healthy weight. The body mass index (BMI) is a screening tool to identify possible weight problems. It provides an estimate of body fat based on height and weight. Your caregiver can help determine your BMI, and can help you achieve or maintain a healthy weight. For adults 20 years and older:  A BMI below 18.5 is considered underweight.  A BMI of 18.5 to 24.9 is normal.  A BMI of 25 to 29.9 is considered overweight.  A BMI of 30 and above is considered obese.  Maintain normal blood lipids and cholesterol by exercising and minimizing your intake of saturated fat. Eat a balanced diet with plenty of fruits and vegetables. Blood tests for lipids and cholesterol should begin at  age 9 and be repeated every 5 years. If your lipid or cholesterol levels are high, you are over 50, or you are a high risk for heart disease, you may need your cholesterol levels checked more frequently.Ongoing high lipid and cholesterol levels should be treated with medicines if diet and exercise are not effective.  If you smoke, find out from your caregiver how to quit. If you do not use tobacco, do not start.  Lung cancer screening is recommended for adults aged 54 80 years who are at high risk for developing lung cancer because of a history of smoking. Yearly low-dose computed tomography (CT) is recommended for people who have at least a 30-pack-year history of smoking and are a current smoker or have quit within the past 15 years. A pack year of smoking is smoking an average of 1 pack of cigarettes a day for 1 year (for example: 1 pack a day for 30 years or 2 packs a day for 15 years). Yearly screening should continue until the smoker has stopped smoking for at least 15 years. Yearly screening should also be stopped for people who develop a health problem that would prevent them from having lung cancer treatment.  If you are pregnant, do not drink alcohol. If you are breastfeeding, be very cautious about drinking alcohol. If you are not pregnant and choose to drink alcohol, do not exceed 1 drink per day. One drink is considered to be 12 ounces (355 mL) of beer, 5 ounces (148 mL) of wine, or 1.5 ounces (  44 mL) of liquor.  Avoid use of street drugs. Do not share needles with anyone. Ask for help if you need support or instructions about stopping the use of drugs.  High blood pressure causes heart disease and increases the risk of stroke. Blood pressure should be checked at least every 1 to 2 years. Ongoing high blood pressure should be treated with medicines, if weight loss and exercise are not effective.  If you are 86 to 52 years old, ask your caregiver if you should take aspirin to prevent  strokes.  Diabetes screening involves taking a blood sample to check your fasting blood sugar level. This should be done once every 3 years, after age 57, if you are within normal weight and without risk factors for diabetes. Testing should be considered at a younger age or be carried out more frequently if you are overweight and have at least 1 risk factor for diabetes.  Breast cancer screening is essential preventative care for women. You should practice "breast self-awareness." This means understanding the normal appearance and feel of your breasts and may include breast self-examination. Any changes detected, no matter how small, should be reported to a caregiver. Women in their 18s and 30s should have a clinical breast exam (CBE) by a caregiver as part of a regular health exam every 1 to 3 years. After age 69, women should have a CBE every year. Starting at age 80, women should consider having a mammogram (breast X-ray) every year. Women who have a family history of breast cancer should talk to their caregiver about genetic screening. Women at a high risk of breast cancer should talk to their caregiver about having an MRI and a mammogram every year.  Breast cancer gene (BRCA)-related cancer risk assessment is recommended for women who have family members with BRCA-related cancers. BRCA-related cancers include breast, ovarian, tubal, and peritoneal cancers. Having family members with these cancers may be associated with an increased risk for harmful changes (mutations) in the breast cancer genes BRCA1 and BRCA2. Results of the assessment will determine the need for genetic counseling and BRCA1 and BRCA2 testing.  The Pap test is a screening test for cervical cancer. Women should have a Pap test starting at age 37. Between ages 20 and 18, Pap tests should be repeated every 2 years. Beginning at age 78, you should have a Pap test every 3 years as long as the past 3 Pap tests have been normal. If you had a  hysterectomy for a problem that was not cancer or a condition that could lead to cancer, then you no longer need Pap tests. If you are between ages 81 and 40, and you have had normal Pap tests going back 10 years, you no longer need Pap tests. If you have had past treatment for cervical cancer or a condition that could lead to cancer, you need Pap tests and screening for cancer for at least 20 years after your treatment. If Pap tests have been discontinued, risk factors (such as a new sexual partner) need to be reassessed to determine if screening should be resumed. Some women have medical problems that increase the chance of getting cervical cancer. In these cases, your caregiver may recommend more frequent screening and Pap tests.  The human papillomavirus (HPV) test is an additional test that may be used for cervical cancer screening. The HPV test looks for the virus that can cause the cell changes on the cervix. The cells collected during the Pap test can  be tested for HPV. The HPV test could be used to screen women aged 81 years and older, and should be used in women of any age who have unclear Pap test results. After the age of 79, women should have HPV testing at the same frequency as a Pap test.  Colorectal cancer can be detected and often prevented. Most routine colorectal cancer screening begins at the age of 28 and continues through age 26. However, your caregiver may recommend screening at an earlier age if you have risk factors for colon cancer. On a yearly basis, your caregiver may provide home test kits to check for hidden blood in the stool. Use of a small camera at the end of a tube, to directly examine the colon (sigmoidoscopy or colonoscopy), can detect the earliest forms of colorectal cancer. Talk to your caregiver about this at age 16, when routine screening begins. Direct examination of the colon should be repeated every 5 to 10 years through age 67, unless early forms of pre-cancerous  polyps or small growths are found.  Hepatitis C blood testing is recommended for all people born from [birth year] through 1965 and any individual with known risks for hepatitis C.  Practice safe sex. Use condoms and avoid high-risk sexual practices to reduce the spread of sexually transmitted infections (STIs). Sexually active women aged 30 and younger should be checked for Chlamydia, which is a common sexually transmitted infection. Older women with new or multiple partners should also be tested for Chlamydia. Testing for other STIs is recommended if you are sexually active and at increased risk.  Osteoporosis is a disease in which the bones lose minerals and strength with aging. This can result in serious bone fractures. The risk of osteoporosis can be identified using a bone density scan. Women ages 32 and over and women at risk for fractures or osteoporosis should discuss screening with their caregivers. Ask your caregiver whether you should be taking a calcium supplement or vitamin D to reduce the rate of osteoporosis.  Menopause can be associated with physical symptoms and risks. Hormone replacement therapy is available to decrease symptoms and risks. You should talk to your caregiver about whether hormone replacement therapy is right for you.  Use sunscreen. Apply sunscreen liberally and repeatedly throughout the day. You should seek shade when your shadow is shorter than you. Protect yourself by wearing long sleeves, pants, a wide-brimmed hat, and sunglasses year round, whenever you are outdoors.  Notify your caregiver of new moles or changes in moles, especially if there is a change in shape or color. Also notify your caregiver if a mole is larger than the size of a pencil eraser.  Stay current with your immunizations. Document Released: 11/01/2010 Document Revised: 08/13/2012 Document Reviewed: 11/01/2010 [facility] Patient Information 2014 [place].

## 2013-11-19 LAB — URINALYSIS W MICROSCOPIC + REFLEX CULTURE
BILIRUBIN URINE: NEGATIVE
Bacteria, UA: NONE SEEN
Casts: NONE SEEN
Crystals: NONE SEEN
Glucose, UA: NEGATIVE mg/dL
Hgb urine dipstick: NEGATIVE
Ketones, ur: NEGATIVE mg/dL
NITRITE: NEGATIVE
PROTEIN: NEGATIVE mg/dL
SQUAMOUS EPITHELIAL / LPF: NONE SEEN
UROBILINOGEN UA: 0.2 mg/dL (ref 0.0–1.0)
pH: 7.5 (ref 5.0–8.0)

## 2013-11-19 LAB — CYTOLOGY - PAP

## 2013-11-20 ENCOUNTER — Other Ambulatory Visit: Payer: Self-pay | Admitting: Gynecology

## 2013-11-20 MED ORDER — AMPICILLIN 500 MG PO CAPS: 500.0000 mg | ORAL_CAPSULE | Freq: Four times a day (QID) | ORAL | Status: DC

## 2013-11-21 LAB — URINE CULTURE: Colony Count: 60000

## 2013-12-06 ENCOUNTER — Ambulatory Visit (HOSPITAL_COMMUNITY)
Admission: RE | Admit: 2013-12-06 | Discharge: 2013-12-06 | Disposition: A | Discharge status: Home / Self Care | Payer: BC Managed Care – PPO | Source: Ambulatory Visit | Attending: Gynecology | Admitting: Gynecology

## 2013-12-06 DIAGNOSIS — Z1231 Encounter for screening mammogram for malignant neoplasm of breast: Secondary | ICD-10-CM | POA: Insufficient documentation

## 2013-12-23 ENCOUNTER — Other Ambulatory Visit: Payer: BC Managed Care – PPO

## 2013-12-23 DIAGNOSIS — Z3009 Encounter for other general counseling and advice on contraception: Secondary | ICD-10-CM

## 2013-12-24 ENCOUNTER — Telehealth: Payer: Self-pay

## 2013-12-24 LAB — FOLLICLE STIMULATING HORMONE: FSH: 112.1 m[IU]/mL

## 2013-12-24 NOTE — Telephone Encounter (Signed)
Patient wanted you to know that when she stopped the pill a month ago she had a 12 day period at end of pack.  She has not had any bleeding since and has passed when she thought her next period would be due.  She just wanted to be sure that you thought that bleeding was okay in light of finding out today that she is menopausal.

## 2013-12-24 NOTE — Telephone Encounter (Signed)
Message copied by Ramond Craver on Tue Dec 24, 2013 11:22 AM ------      Message from: Anastasio Auerbach      Created: Tue Dec 24, 2013 10:07 AM       Tell patient that her [facility] is elevated consistent with menopause. Normal up to 23. Her's is 112. Recommend keep menstrual calendar and report any bleeding. I would at least for now use backup contraception although pregnancy highly unlikely. ------

## 2013-12-24 NOTE — Telephone Encounter (Signed)
Patient advised.

## 2013-12-24 NOTE — Telephone Encounter (Signed)
That's okay since it was at the end of her pills. I would just keep a menstrual calendar for now we'll see what she does. If she has any bleeding just let me know going forward

## 2014-03-03 ENCOUNTER — Encounter: Payer: Self-pay | Admitting: Gynecology

## 2014-10-31 ENCOUNTER — Other Ambulatory Visit: Payer: Self-pay | Admitting: Gynecology

## 2014-10-31 DIAGNOSIS — Z1231 Encounter for screening mammogram for malignant neoplasm of breast: Secondary | ICD-10-CM

## 2014-11-26 ENCOUNTER — Encounter: Payer: Self-pay | Admitting: Gynecology

## 2014-11-26 ENCOUNTER — Ambulatory Visit (INDEPENDENT_AMBULATORY_CARE_PROVIDER_SITE_OTHER): Payer: BLUE CROSS/BLUE SHIELD | Admitting: Gynecology

## 2014-11-26 VITALS — BP 124/80 | Ht 66.0 in | Wt 146.0 lb

## 2014-11-26 DIAGNOSIS — Z01419 Encounter for gynecological examination (general) (routine) without abnormal findings: Secondary | ICD-10-CM | POA: Diagnosis not present

## 2014-11-26 NOTE — Progress Notes (Signed)
Breanna Noble March 20, 1961 [id number]        54 y.o.  G2P2 for annual exam.  Doing well without complaints.  Past medical history,surgical history, problem list, medications, allergies, family history and social history were all reviewed and documented as reviewed in the EPIC chart.  ROS:  Performed with pertinent positives and negatives included in the history, assessment and plan.   Additional significant findings :  none   Exam: Kim Counsellor Vitals:   11/26/14 0816  BP: 124/80  Height: 5\' 6"  (1.676 m)  Weight: 146 lb (66.225 kg)   General appearance:  Normal affect, orientation and appearance. Skin: Grossly normal HEENT: Without gross lesions.  No cervical or supraclavicular adenopathy. Thyroid normal.  Lungs:  Clear without wheezing, rales or rhonchi Cardiac: RR, without RMG Abdominal:  Soft, nontender, without masses, guarding, rebound, organomegaly or hernia Breasts:  Examined lying and sitting without masses, retractions, discharge or axillary adenopathy. Pelvic:  Ext/BUS/vagina normal  Cervix normal  Uterus anteverted, normal size, shape and contour, midline and mobile nontender   Adnexa  Without masses or tenderness    Anus and perineum  Normal   Rectovaginal  Normal sphincter tone without palpated masses or tenderness.    Assessment/Plan:  54 y.o. G2P2 female for annual exam.   1. Postmenopausal.  One year without menses. [place] 112 last year.  Having mild hot flushes but otherwise doing well. Not interested in HRT. Continue to monitor and report any vaginal bleeding. 2. Mammogram coming due in August and I reminded her to schedule this. SBE monthly reviewed. 3. Pap smear/HPV negative 2015. No Pap smear done today. No history of abnormal Pap smears previously.  Plan repeat at 3-5 year interval per current screening guidelines. 4. Colonoscopy 2014. Repeat at their recommended interval. 5. Health maintenance. No routine blood work done as this is done at her primary  physician's office. Follow up in one year, sooner as needed.   [name], 8:38 AM 11/26/2014

## 2014-11-26 NOTE — Patient Instructions (Signed)
You may obtain a copy of any labs that were done today by logging onto MyChart as outlined in the instructions provided with your AVS (after visit summary). The office will not call with normal lab results but certainly if there are any significant abnormalities then we will contact you.   Health Maintenance, Female A healthy lifestyle and preventative care can promote health and wellness.  Maintain regular health, dental, and eye exams.  Eat a healthy diet. Foods like vegetables, fruits, whole grains, low-fat dairy products, and lean protein foods contain the nutrients you need without too many calories. Decrease your intake of foods high in solid fats, added sugars, and salt. Get information about a proper diet from your caregiver, if necessary.  Regular physical exercise is one of the most important things you can do for your health. Most adults should get at least 150 minutes of moderate-intensity exercise (any activity that increases your heart rate and causes you to sweat) each week. In addition, most adults need muscle-strengthening exercises on 2 or more days a week.   Maintain a healthy weight. The body mass index (BMI) is a screening tool to identify possible weight problems. It provides an estimate of body fat based on height and weight. Your caregiver can help determine your BMI, and can help you achieve or maintain a healthy weight. For adults 20 years and older:  A BMI below 18.5 is considered underweight.  A BMI of 18.5 to 24.9 is normal.  A BMI of 25 to 29.9 is considered overweight.  A BMI of 30 and above is considered obese.  Maintain normal blood lipids and cholesterol by exercising and minimizing your intake of saturated fat. Eat a balanced diet with plenty of fruits and vegetables. Blood tests for lipids and cholesterol should begin at age 47 and be repeated every 5 years. If your lipid or cholesterol levels are high, you are over 50, or you are a high risk for heart  disease, you may need your cholesterol levels checked more frequently.Ongoing high lipid and cholesterol levels should be treated with medicines if diet and exercise are not effective.  If you smoke, find out from your caregiver how to quit. If you do not use tobacco, do not start.  Lung cancer screening is recommended for adults aged 33 80 years who are at high risk for developing lung cancer because of a history of smoking. Yearly low-dose computed tomography (CT) is recommended for people who have at least a 30-pack-year history of smoking and are a current smoker or have quit within the past 15 years. A pack year of smoking is smoking an average of 1 pack of cigarettes a day for 1 year (for example: 1 pack a day for 30 years or 2 packs a day for 15 years). Yearly screening should continue until the smoker has stopped smoking for at least 15 years. Yearly screening should also be stopped for people who develop a health problem that would prevent them from having lung cancer treatment.  If you are pregnant, do not drink alcohol. If you are breastfeeding, be very cautious about drinking alcohol. If you are not pregnant and choose to drink alcohol, do not exceed 1 drink per day. One drink is considered to be 12 ounces (355 mL) of beer, 5 ounces (148 mL) of wine, or 1.5 ounces (44 mL) of liquor.  Avoid use of street drugs. Do not share needles with anyone. Ask for help if you need support or instructions about stopping  the use of drugs.  High blood pressure causes heart disease and increases the risk of stroke. Blood pressure should be checked at least every 1 to 2 years. Ongoing high blood pressure should be treated with medicines, if weight loss and exercise are not effective.  If you are 59 to 53 years old, ask your caregiver if you should take aspirin to prevent strokes.  Diabetes screening involves taking a blood sample to check your fasting blood sugar level. This should be done once every 3  years, after age 91, if you are within normal weight and without risk factors for diabetes. Testing should be considered at a younger age or be carried out more frequently if you are overweight and have at least 1 risk factor for diabetes.  Breast cancer screening is essential preventative care for women. You should practice "breast self-awareness." This means understanding the normal appearance and feel of your breasts and may include breast self-examination. Any changes detected, no matter how small, should be reported to a caregiver. Women in their 66s and 30s should have a clinical breast exam (CBE) by a caregiver as part of a regular health exam every 1 to 3 years. After age 101, women should have a CBE every year. Starting at age 100, women should consider having a mammogram (breast X-ray) every year. Women who have a family history of breast cancer should talk to their caregiver about genetic screening. Women at a high risk of breast cancer should talk to their caregiver about having an MRI and a mammogram every year.  Breast cancer gene (BRCA)-related cancer risk assessment is recommended for women who have family members with BRCA-related cancers. BRCA-related cancers include breast, ovarian, tubal, and peritoneal cancers. Having family members with these cancers may be associated with an increased risk for harmful changes (mutations) in the breast cancer genes BRCA1 and BRCA2. Results of the assessment will determine the need for genetic counseling and BRCA1 and BRCA2 testing.  The Pap test is a screening test for cervical cancer. Women should have a Pap test starting at age 57. Between ages 25 and 35, Pap tests should be repeated every 2 years. Beginning at age 37, you should have a Pap test every 3 years as long as the past 3 Pap tests have been normal. If you had a hysterectomy for a problem that was not cancer or a condition that could lead to cancer, then you no longer need Pap tests. If you are  between ages 50 and 76, and you have had normal Pap tests going back 10 years, you no longer need Pap tests. If you have had past treatment for cervical cancer or a condition that could lead to cancer, you need Pap tests and screening for cancer for at least 20 years after your treatment. If Pap tests have been discontinued, risk factors (such as a new sexual partner) need to be reassessed to determine if screening should be resumed. Some women have medical problems that increase the chance of getting cervical cancer. In these cases, your caregiver may recommend more frequent screening and Pap tests.  The human papillomavirus (HPV) test is an additional test that may be used for cervical cancer screening. The HPV test looks for the virus that can cause the cell changes on the cervix. The cells collected during the Pap test can be tested for HPV. The HPV test could be used to screen women aged 44 years and older, and should be used in women of any age  who have unclear Pap test results. After the age of 69, women should have HPV testing at the same frequency as a Pap test.  Colorectal cancer can be detected and often prevented. Most routine colorectal cancer screening begins at the age of 39 and continues through age 77. However, your caregiver may recommend screening at an earlier age if you have risk factors for colon cancer. On a yearly basis, your caregiver may provide home test kits to check for hidden blood in the stool. Use of a small camera at the end of a tube, to directly examine the colon (sigmoidoscopy or colonoscopy), can detect the earliest forms of colorectal cancer. Talk to your caregiver about this at age 37, when routine screening begins. Direct examination of the colon should be repeated every 5 to 10 years through age 58, unless early forms of pre-cancerous polyps or small growths are found.  Hepatitis C blood testing is recommended for all people born from [birth year] through 1964 and any  individual with known risks for hepatitis C.  Practice safe sex. Use condoms and avoid high-risk sexual practices to reduce the spread of sexually transmitted infections (STIs). Sexually active women aged 53 and younger should be checked for Chlamydia, which is a common sexually transmitted infection. Older women with new or multiple partners should also be tested for Chlamydia. Testing for other STIs is recommended if you are sexually active and at increased risk.  Osteoporosis is a disease in which the bones lose minerals and strength with aging. This can result in serious bone fractures. The risk of osteoporosis can be identified using a bone density scan. Women ages 44 and over and women at risk for fractures or osteoporosis should discuss screening with their caregivers. Ask your caregiver whether you should be taking a calcium supplement or vitamin D to reduce the rate of osteoporosis.  Menopause can be associated with physical symptoms and risks. Hormone replacement therapy is available to decrease symptoms and risks. You should talk to your caregiver about whether hormone replacement therapy is right for you.  Use sunscreen. Apply sunscreen liberally and repeatedly throughout the day. You should seek shade when your shadow is shorter than you. Protect yourself by wearing long sleeves, pants, a wide-brimmed hat, and sunglasses year round, whenever you are outdoors.  Notify your caregiver of new moles or changes in moles, especially if there is a change in shape or color. Also notify your caregiver if a mole is larger than the size of a pencil eraser.  Stay current with your immunizations. Document Released: 11/01/2010 Document Revised: 08/13/2012 Document Reviewed: 11/01/2010 [facility] Patient Information 2014 [place].

## 2014-11-27 LAB — URINALYSIS W MICROSCOPIC + REFLEX CULTURE
BACTERIA UA: NONE SEEN [HPF]
BILIRUBIN URINE: NEGATIVE
Casts: NONE SEEN [LPF]
Crystals: NONE SEEN [HPF]
GLUCOSE, UA: NEGATIVE
HGB URINE DIPSTICK: NEGATIVE
KETONES UR: NEGATIVE
LEUKOCYTES UA: NEGATIVE
Nitrite: NEGATIVE
PROTEIN: NEGATIVE
RBC / HPF: NONE SEEN RBC/HPF (ref <=2)
SPECIFIC GRAVITY, URINE: 1.005 (ref 1.001–1.035)
SQUAMOUS EPITHELIAL / LPF: NONE SEEN [HPF] (ref <=5)
WBC, UA: NONE SEEN WBC/HPF (ref <=5)
Yeast: NONE SEEN [HPF]
pH: 7 (ref 5.0–8.0)

## 2014-12-08 ENCOUNTER — Ambulatory Visit (HOSPITAL_COMMUNITY)
Admission: RE | Admit: 2014-12-08 | Discharge: 2014-12-08 | Disposition: A | Discharge status: Home / Self Care | Payer: BLUE CROSS/BLUE SHIELD | Source: Ambulatory Visit | Attending: Gynecology | Admitting: Gynecology

## 2014-12-08 DIAGNOSIS — Z1231 Encounter for screening mammogram for malignant neoplasm of breast: Secondary | ICD-10-CM | POA: Insufficient documentation

## 2015-11-10 ENCOUNTER — Other Ambulatory Visit: Payer: Self-pay | Admitting: Gynecology

## 2015-11-10 DIAGNOSIS — Z1231 Encounter for screening mammogram for malignant neoplasm of breast: Secondary | ICD-10-CM

## 2016-01-19 ENCOUNTER — Ambulatory Visit: Payer: BLUE CROSS/BLUE SHIELD

## 2016-01-21 ENCOUNTER — Ambulatory Visit (INDEPENDENT_AMBULATORY_CARE_PROVIDER_SITE_OTHER): Payer: BLUE CROSS/BLUE SHIELD | Admitting: Gynecology

## 2016-01-21 ENCOUNTER — Ambulatory Visit
Admission: RE | Admit: 2016-01-21 | Discharge: 2016-01-21 | Disposition: A | Discharge status: Home / Self Care | Payer: BLUE CROSS/BLUE SHIELD | Source: Ambulatory Visit | Attending: Gynecology | Admitting: Gynecology

## 2016-01-21 ENCOUNTER — Ambulatory Visit: Payer: BLUE CROSS/BLUE SHIELD

## 2016-01-21 ENCOUNTER — Encounter: Payer: Self-pay | Admitting: Gynecology

## 2016-01-21 VITALS — BP 116/76 | Ht 65.0 in | Wt 145.0 lb

## 2016-01-21 DIAGNOSIS — Z1231 Encounter for screening mammogram for malignant neoplasm of breast: Secondary | ICD-10-CM | POA: Diagnosis not present

## 2016-01-21 DIAGNOSIS — Z01419 Encounter for gynecological examination (general) (routine) without abnormal findings: Secondary | ICD-10-CM | POA: Diagnosis not present

## 2016-01-21 DIAGNOSIS — Z23 Encounter for immunization: Secondary | ICD-10-CM | POA: Diagnosis not present

## 2016-01-21 DIAGNOSIS — N952 Postmenopausal atrophic vaginitis: Secondary | ICD-10-CM | POA: Diagnosis not present

## 2016-01-21 DIAGNOSIS — Z789 Other specified health status: Secondary | ICD-10-CM | POA: Diagnosis not present

## 2016-01-21 DIAGNOSIS — Z0000 Encounter for general adult medical examination without abnormal findings: Secondary | ICD-10-CM | POA: Diagnosis not present

## 2016-01-21 DIAGNOSIS — E039 Hypothyroidism, unspecified: Secondary | ICD-10-CM | POA: Diagnosis not present

## 2016-01-21 DIAGNOSIS — E611 Iron deficiency: Secondary | ICD-10-CM | POA: Diagnosis not present

## 2016-01-21 NOTE — Patient Instructions (Signed)

## 2016-01-21 NOTE — Progress Notes (Signed)
    Breanna Noble 1961-03-31 [id number]        55 y.o.  G2P2  for annual exam.  Doing well without complaints  Past medical history,surgical history, problem list, medications, allergies, family history and social history were all reviewed and documented as reviewed in the EPIC chart.  ROS:  Performed with pertinent positives and negatives included in the history, assessment and plan.   Additional significant findings :  None   Exam: Caryn Bee assistant Vitals:   01/21/16 1019  BP: 116/76  Weight: 145 lb (65.8 kg)  Height: 5\' 5"  (1.651 m)   Body mass index is 24.13 kg/m.  General appearance:  Normal affect, orientation and appearance. Skin: Grossly normal HEENT: Without gross lesions.  No cervical or supraclavicular adenopathy. Thyroid normal.  Lungs:  Clear without wheezing, rales or rhonchi Cardiac: RR, without RMG Abdominal:  Soft, nontender, without masses, guarding, rebound, organomegaly or hernia Breasts:  Examined lying and sitting without masses, retractions, discharge or axillary adenopathy. Pelvic:  Ext, BUS, Vagina with atrophic changes  Cervix with atrophic changes  Uterus anteverted, normal size, shape and contour, midline and mobile nontender   Adnexa without masses or tenderness    Anus and perineum normal   Rectovaginal normal sphincter tone without palpated masses or tenderness.    Assessment/Plan:  55 y.o. G2P2 female for annual exam.   1. Postmenopausal/atrophic genital changes. Doing well without significant hot flushes, night sweats, vaginal dryness or any vaginal bleeding. Continue to monitor report any issues or bleeding. 2. Mammography 01/2016. Continue with annual mammography when due. SBE monthly reviewed. 3. Pap smear/HPV 2015 negative. No Pap smear done today. No history of abnormal Pap smears previously. Plan repeat Pap smear at 5 year interval per current screening guidelines area 4. Colonoscopy 2014. Repeat at their recommended  interval. 5. Health maintenance. No routine lab work done as patient does this elsewhere. Follow up 1 year, sooner as needed.   [name], 10:48 AM 01/21/2016

## 2016-07-21 DIAGNOSIS — E039 Hypothyroidism, unspecified: Secondary | ICD-10-CM | POA: Diagnosis not present

## 2016-07-21 DIAGNOSIS — E611 Iron deficiency: Secondary | ICD-10-CM | POA: Diagnosis not present

## 2016-07-21 DIAGNOSIS — Z789 Other specified health status: Secondary | ICD-10-CM | POA: Diagnosis not present

## 2016-07-21 DIAGNOSIS — Z52008 Unspecified donor, other blood: Secondary | ICD-10-CM | POA: Diagnosis not present

## 2016-08-05 ENCOUNTER — Ambulatory Visit (INDEPENDENT_AMBULATORY_CARE_PROVIDER_SITE_OTHER): Payer: BLUE CROSS/BLUE SHIELD | Admitting: Gynecology

## 2016-08-05 ENCOUNTER — Encounter: Payer: Self-pay | Admitting: Gynecology

## 2016-08-05 VITALS — BP 116/76

## 2016-08-05 DIAGNOSIS — R102 Pelvic and perineal pain: Secondary | ICD-10-CM | POA: Diagnosis not present

## 2016-08-05 LAB — URINALYSIS W MICROSCOPIC + REFLEX CULTURE
BACTERIA UA: NONE SEEN [HPF]
Bilirubin Urine: NEGATIVE
Casts: NONE SEEN [LPF]
Crystals: NONE SEEN [HPF]
Glucose, UA: NEGATIVE
Hgb urine dipstick: NEGATIVE
Ketones, ur: NEGATIVE
LEUKOCYTES UA: NEGATIVE
Nitrite: NEGATIVE
PROTEIN: NEGATIVE
RBC / HPF: NONE SEEN RBC/HPF (ref <=2)
WBC UA: NONE SEEN WBC/HPF (ref <=5)
YEAST: NONE SEEN [HPF]
pH: 7 (ref 5.0–8.0)

## 2016-08-05 NOTE — Addendum Note (Signed)
Addended by: Nelva Nay on: 08/05/2016 10:06 AM   Modules accepted: Orders

## 2016-08-05 NOTE — Progress Notes (Signed)
    BURNETT LIEBER 1961-03-20 [id number]        56 y.o.  G2P2 presents with 2 weeks of pelvic cramping. Patient feels like her menses are going to begin. No bleeding. No breast tenderness. LMP 3 years ago. No constipation diarrhea. No urinary symptoms such as frequency dysuria or urgency low back pain fever or chills.  Past medical history,surgical history, problem list, medications, allergies, family history and social history were all reviewed and documented in the EPIC chart.  Directed ROS with pertinent positives and negatives documented in the history of present illness/assessment and plan.  Exam: Caryn Bee assistant Vitals:   08/05/16 0951  BP: 116/76   General appearance:  Normal Spine straight without CVA tenderness Abdomen soft nontender without masses guarding rebound Pelvic external BUS vagina with atrophic changes. Cervix with atrophic changes. Uterus normal size midline mobile nontender. Adnexa without masses or tenderness.  Assessment/Plan:  56 y.o. G2P2 with 2 weeks of pelvic cramping feeling like her menses is going to begin. Physical exam is normal. Urinalysis is negative. No moliminal symptoms such as breast tenderness. Discussed differential to include GYN versus non-GYN. We'll start with pelvic ultrasound to rule out nonpalpable abnormalities. We'll monitor pain for now. Assuming symptoms resolved and ultrasound negative them will follow. If otherwise then will triage based upon results.    [name], 9:59 AM 08/05/2016

## 2016-08-05 NOTE — Patient Instructions (Signed)
Follow up for ultrasound as scheduled 

## 2016-08-11 ENCOUNTER — Encounter: Payer: Self-pay | Admitting: Gynecology

## 2016-08-11 ENCOUNTER — Ambulatory Visit (INDEPENDENT_AMBULATORY_CARE_PROVIDER_SITE_OTHER): Payer: BLUE CROSS/BLUE SHIELD

## 2016-08-11 ENCOUNTER — Other Ambulatory Visit: Payer: Self-pay | Admitting: Gynecology

## 2016-08-11 ENCOUNTER — Ambulatory Visit (INDEPENDENT_AMBULATORY_CARE_PROVIDER_SITE_OTHER): Payer: BLUE CROSS/BLUE SHIELD | Admitting: Gynecology

## 2016-08-11 VITALS — BP 120/76

## 2016-08-11 DIAGNOSIS — N838 Other noninflammatory disorders of ovary, fallopian tube and broad ligament: Secondary | ICD-10-CM

## 2016-08-11 DIAGNOSIS — N83201 Unspecified ovarian cyst, right side: Secondary | ICD-10-CM | POA: Diagnosis not present

## 2016-08-11 DIAGNOSIS — R102 Pelvic and perineal pain: Secondary | ICD-10-CM | POA: Diagnosis not present

## 2016-08-11 DIAGNOSIS — N839 Noninflammatory disorder of ovary, fallopian tube and broad ligament, unspecified: Secondary | ICD-10-CM

## 2016-08-11 NOTE — Patient Instructions (Signed)
Follow up for the utrasound in 3 months

## 2016-08-11 NOTE — Progress Notes (Addendum)
    Breanna Noble Jun 07, 1960 [id number]        56 y.o.  G2P2 presents for ultrasound. Has a several week history of pelvic cramping feeling like her menses is about to begin. No bleeding or other moliminal symptoms such as breast tenderness. Last menstrual period 3 years ago. No constipation diarrhea nausea vomiting fever or chills. No urinary symptoms such as frequency dysuria or urgency. She does note that they had been on a vegan type diet but she has "fallen off the wagon" several times.  Past medical history,surgical history, problem list, medications, allergies, family history and social history were all reviewed and documented in the EPIC chart.  Directed ROS with pertinent positives and negatives documented in the history of present illness/assessment and plan.  Exam: Vitals:   08/11/16 1430  BP: 120/76   General appearance:  Normal  Ultrasound transvaginal shows uterus normal size and echotexture. Endometrial echo 2.9 mm. Right ovary with thick walled small cyst 9 x 12 x 10 mm with peripheral flow. Small calcification 6 x 6 mm adjacent to this. A separate small cyst 11 x 14 mm. Left ovary normal. Cul-de-sac negative  Assessment/Plan:  56 y.o. G2P2 with several week history of pelvic cramping. I think the ultrasound overall was normal. I suspected her cramping is more GI and may be related to introducing different foods into her diet. She does have 2 small cysts in the right ovary with a small calcification. I reviewed this with her in the differential to include physiologic/benign versus malignant. Will check baseline CA-125 for her reassurance and I did recommend repeat of the ultrasound in 3 months just to make sure this is a stable finding and she agrees with this plan.  Greater than 50% of my time was spent in direct face to face counseling and coordination of care with the patient.     [name], 2:45 PM 08/11/2016

## 2016-08-12 LAB — CA 125: CA 125: 8 U/mL (ref <35)

## 2016-11-10 ENCOUNTER — Other Ambulatory Visit: Payer: BLUE CROSS/BLUE SHIELD

## 2016-11-10 ENCOUNTER — Ambulatory Visit: Payer: BLUE CROSS/BLUE SHIELD | Admitting: Gynecology

## 2016-12-01 ENCOUNTER — Ambulatory Visit (INDEPENDENT_AMBULATORY_CARE_PROVIDER_SITE_OTHER): Payer: BLUE CROSS/BLUE SHIELD

## 2016-12-01 ENCOUNTER — Encounter: Payer: Self-pay | Admitting: Gynecology

## 2016-12-01 ENCOUNTER — Other Ambulatory Visit: Payer: Self-pay | Admitting: Gynecology

## 2016-12-01 ENCOUNTER — Ambulatory Visit (INDEPENDENT_AMBULATORY_CARE_PROVIDER_SITE_OTHER): Payer: BLUE CROSS/BLUE SHIELD | Admitting: Gynecology

## 2016-12-01 VITALS — BP 118/78

## 2016-12-01 DIAGNOSIS — N839 Noninflammatory disorder of ovary, fallopian tube and broad ligament, unspecified: Secondary | ICD-10-CM

## 2016-12-01 DIAGNOSIS — N83201 Unspecified ovarian cyst, right side: Secondary | ICD-10-CM | POA: Diagnosis not present

## 2016-12-01 DIAGNOSIS — N838 Other noninflammatory disorders of ovary, fallopian tube and broad ligament: Secondary | ICD-10-CM

## 2016-12-01 NOTE — Patient Instructions (Signed)
Follow up ultrasound January 2019

## 2016-12-01 NOTE — Progress Notes (Signed)
    Breanna Noble 1960-11-26 [id number]        56 y.o.  G2P2 presents for follow up ultrasound.  Had ultrasound in April 2018 due to pelvic cramping. Showed a thick-walled cyst 9 x 12 x 10 mm with peripheral flow. Small 6 x 6 mm calcification adjacent to this. Separate 11 x 14 mm cyst. CA-125 was 8. Was recommended to repeat her ultrasound now  Past medical history,surgical history, problem list, medications, allergies, family history and social history were all reviewed and documented in the EPIC chart.  Directed ROS with pertinent positives and negatives documented in the history of present illness/assessment and plan.  Exam: Vitals:   12/01/16 1542  BP: 118/78   General appearance:  Normal  Ultrasound shows uterus normal size and echotexture. Endometrial echo 2.8 mm. Right ovary with irregular echo-free cyst 17 x 15 x 16 mm with negative color-flow. Cystic solid area 16 x 13 x 14 mm with calcification 8 mm it wall with peripheral flow. Left ovary normal. Cul-de-sac negative.  Assessment/Plan:  56 y.o. G2P2 with cystic changes right ovary. Does not appear to have significant change since prior ultrasound approaching 4 months ago. In review of her records ultrasound 2014 showed a cystic right ovarian area with solid peripheral mass. In general everything overall looks stable. Reviewed with patient in detail the issues of cannot rule out malignancy and options to include laparoscopic oophorectomy/BSO now versus continued observation. She's having no symptoms such as cramping or discomfort. I reviewed what is involved with laparoscopic surgery to include multiple port sites, BSO and subsequent recovery. At this point she feels more comfortable just monitoring. I did recommend follow up ultrasound January 2019. If continues to be stable then will follow. If there is any evidence of enlargement that I recommended laparoscopic BSO.  Patient also is planning follow up annual exam in September and will  follow up at that time.  Greater than 50% of my time was spent in direct face to face counseling and coordination of care with the patient.     [name], 4:15 PM 12/01/2016

## 2016-12-02 ENCOUNTER — Other Ambulatory Visit: Payer: Self-pay | Admitting: Gynecology

## 2016-12-02 DIAGNOSIS — Z1231 Encounter for screening mammogram for malignant neoplasm of breast: Secondary | ICD-10-CM

## 2017-01-23 ENCOUNTER — Encounter: Payer: Self-pay | Admitting: Gynecology

## 2017-01-23 ENCOUNTER — Ambulatory Visit (INDEPENDENT_AMBULATORY_CARE_PROVIDER_SITE_OTHER): Payer: BLUE CROSS/BLUE SHIELD | Admitting: Gynecology

## 2017-01-23 ENCOUNTER — Encounter: Payer: BLUE CROSS/BLUE SHIELD | Admitting: Gynecology

## 2017-01-23 ENCOUNTER — Ambulatory Visit
Admission: RE | Admit: 2017-01-23 | Discharge: 2017-01-23 | Disposition: A | Discharge status: Home / Self Care | Payer: BLUE CROSS/BLUE SHIELD | Source: Ambulatory Visit | Attending: Gynecology | Admitting: Gynecology

## 2017-01-23 VITALS — BP 118/76 | Ht 66.0 in | Wt 140.0 lb

## 2017-01-23 DIAGNOSIS — Z01411 Encounter for gynecological examination (general) (routine) with abnormal findings: Secondary | ICD-10-CM

## 2017-01-23 DIAGNOSIS — N952 Postmenopausal atrophic vaginitis: Secondary | ICD-10-CM

## 2017-01-23 DIAGNOSIS — N83201 Unspecified ovarian cyst, right side: Secondary | ICD-10-CM | POA: Diagnosis not present

## 2017-01-23 DIAGNOSIS — Z1231 Encounter for screening mammogram for malignant neoplasm of breast: Secondary | ICD-10-CM

## 2017-01-23 NOTE — Patient Instructions (Signed)
Follow up in January for ultrasound as scheduled.  Follow up in one year for annual exam

## 2017-01-23 NOTE — Progress Notes (Signed)
    Breanna Noble 19-Apr-1961 [id number]        56 y.o.  G2P2 for annual gynecologic exam.  Doing well without complaints. Being followed for right ovarian cyst as noted below.  Past medical history,surgical history, problem list, medications, allergies, family history and social history were all reviewed and documented as reviewed in the EPIC chart.  ROS:  Performed with pertinent positives and negatives included in the history, assessment and plan.   Additional significant findings :  None   Exam: Caryn Bee assistant Vitals:   01/23/17 1211  BP: 118/76  Weight: 140 lb (63.5 kg)  Height: 5\' 6"  (1.676 m)   Body mass index is 22.6 kg/m.  General appearance:  Normal affect, orientation and appearance. Skin: Grossly normal HEENT: Without gross lesions.  No cervical or supraclavicular adenopathy. Thyroid normal.  Lungs:  Clear without wheezing, rales or rhonchi Cardiac: RR, without RMG Abdominal:  Soft, nontender, without masses, guarding, rebound, organomegaly or hernia Breasts:  Examined lying and sitting without masses, retractions, discharge or axillary adenopathy. Pelvic:  Ext, BUS, Vagina: With atrophic changes  Cervix: With atrophic changes  Uterus: Anteverted, normal size, shape and contour, midline and mobile nontender   Adnexa: Without masses or tenderness    Anus and perineum: Normal   Rectovaginal: Normal sphincter tone without palpated masses or tenderness.    Assessment/Plan:  56 y.o. G2P2 female for annual gynecologic exam.   1. Right ovarian cyst. Ultrasound April 2018 due to pelvic cramping showed a thick-walled cyst 9 x 12 x 10 mm with peripheral flow. Small 6 x 6 mm calcification adjacent noted. Separate 11 x 14 cyst. CA-125 8. Follow up ultrasound August showed right ovary with echo-free cyst 17 x 15 x 16 mm with negative color-flow. Cystic solid area 16 x 13 x 14 mm with calcification 8 mm in wall with peripheral flow. Did not appear to have significant change  from prior study. Endometrial echo noted to be 2.8 mm also. Options for management to include interventional with laparoscopy to consider BSO versus ongoing observation with repeat ultrasound discussed. Patient having no symptoms attributable to this and no family history of ovarian cancer and preferred observation. She has a follow up ultrasound scheduled in January. She'll follow up for this. Exam today shows no evidence of adnexal pathology on palpation. 2. Mammography today. Breast exam normal. 3. Pap smear/HPV 2015. No Pap smear done today. No history of significant abnormal Pap smears. Plan follow up Pap smear at 5 year interval per current screening guidelines. 4. Colonoscopy 2014. Repeat at their recommended interval. 5. Bone density. Will plan further into menopause. 6. Health maintenance. No routine lab work done as patient does this elsewhere. Follow up January for repeat ultrasound as scheduled otherwise annual exam in one year.   [name], 12:49 PM 01/23/2017

## 2017-01-24 DIAGNOSIS — E611 Iron deficiency: Secondary | ICD-10-CM | POA: Diagnosis not present

## 2017-01-24 DIAGNOSIS — Z13220 Encounter for screening for lipoid disorders: Secondary | ICD-10-CM | POA: Diagnosis not present

## 2017-01-24 DIAGNOSIS — Z23 Encounter for immunization: Secondary | ICD-10-CM | POA: Diagnosis not present

## 2017-01-24 DIAGNOSIS — E039 Hypothyroidism, unspecified: Secondary | ICD-10-CM | POA: Diagnosis not present

## 2017-01-24 DIAGNOSIS — Z0000 Encounter for general adult medical examination without abnormal findings: Secondary | ICD-10-CM | POA: Diagnosis not present

## 2017-03-10 DIAGNOSIS — H33321 Round hole, right eye: Secondary | ICD-10-CM | POA: Diagnosis not present

## 2017-03-10 DIAGNOSIS — H35341 Macular cyst, hole, or pseudohole, right eye: Secondary | ICD-10-CM | POA: Diagnosis not present

## 2017-03-10 DIAGNOSIS — H43813 Vitreous degeneration, bilateral: Secondary | ICD-10-CM | POA: Diagnosis not present

## 2017-03-10 DIAGNOSIS — H35412 Lattice degeneration of retina, left eye: Secondary | ICD-10-CM | POA: Diagnosis not present

## 2017-03-31 DIAGNOSIS — H35341 Macular cyst, hole, or pseudohole, right eye: Secondary | ICD-10-CM | POA: Diagnosis not present

## 2017-05-04 DIAGNOSIS — H35341 Macular cyst, hole, or pseudohole, right eye: Secondary | ICD-10-CM | POA: Diagnosis not present

## 2017-05-05 DIAGNOSIS — H35341 Macular cyst, hole, or pseudohole, right eye: Secondary | ICD-10-CM | POA: Diagnosis not present

## 2017-05-08 ENCOUNTER — Ambulatory Visit: Payer: BLUE CROSS/BLUE SHIELD | Admitting: Gynecology

## 2017-05-08 ENCOUNTER — Other Ambulatory Visit: Payer: BLUE CROSS/BLUE SHIELD

## 2017-05-12 DIAGNOSIS — H35341 Macular cyst, hole, or pseudohole, right eye: Secondary | ICD-10-CM | POA: Diagnosis not present

## 2017-06-02 DIAGNOSIS — H35341 Macular cyst, hole, or pseudohole, right eye: Secondary | ICD-10-CM | POA: Diagnosis not present

## 2017-06-14 ENCOUNTER — Other Ambulatory Visit: Payer: Self-pay | Admitting: Gynecology

## 2017-06-14 ENCOUNTER — Ambulatory Visit (INDEPENDENT_AMBULATORY_CARE_PROVIDER_SITE_OTHER): Payer: BLUE CROSS/BLUE SHIELD

## 2017-06-14 ENCOUNTER — Ambulatory Visit: Payer: BLUE CROSS/BLUE SHIELD | Admitting: Gynecology

## 2017-06-14 ENCOUNTER — Encounter: Payer: Self-pay | Admitting: Gynecology

## 2017-06-14 VITALS — BP 116/74

## 2017-06-14 DIAGNOSIS — N83201 Unspecified ovarian cyst, right side: Secondary | ICD-10-CM | POA: Diagnosis not present

## 2017-06-14 DIAGNOSIS — R350 Frequency of micturition: Secondary | ICD-10-CM

## 2017-06-14 DIAGNOSIS — N838 Other noninflammatory disorders of ovary, fallopian tube and broad ligament: Secondary | ICD-10-CM

## 2017-06-14 DIAGNOSIS — N839 Noninflammatory disorder of ovary, fallopian tube and broad ligament, unspecified: Secondary | ICD-10-CM | POA: Diagnosis not present

## 2017-06-14 NOTE — Patient Instructions (Signed)
Follow-up in August for annual exam and we will repeat the ultrasound at that time.

## 2017-06-14 NOTE — Addendum Note (Signed)
Addended by: Nelva Nay on: 06/14/2017 04:08 PM   Modules accepted: Orders

## 2017-06-14 NOTE — Progress Notes (Signed)
    Breanna Noble [birth year] [id number]        57 y.o.  G2P2 presents for follow-up ultrasound.  History of right small peripherally vascular ovarian cyst that would been following her for approaching 1 year.  Ultrasound initially ordered for pelvic cramping which has resolved.  Past medical history,surgical history, problem list, medications, allergies, family history and social history were all reviewed and documented in the EPIC chart.  Directed ROS with pertinent positives and negatives documented in the history of present illness/assessment and plan.  Exam: Vitals:   06/14/17 1444  BP: 116/74   General appearance:  Normal  Ultrasound shows uterus normal size and echotexture.  Endometrial echo 1.3 mm.  Left ovary normal.  Right ovary with echo-free 13 x 11 mm thin-walled cyst.  Separate echogenic cystic solid area 14 x 10 x 13 mm with increased peripheral flow.  Cul-de-sac negative.  Assessment/Plan:  57 y.o. G2P2 with persistent small right peripherally vascular cyst.  Stable from prior ultrasounds.  The previous CA 125 8.  Patient without discomfort or other symptoms.  Options for management were reviewed with the patient to include continued observation with follow-up ultrasound in the fall when she is due for her annual exam versus intervention now with laparoscopic salpingo-oophorectomy.  Disclaimer that I cannot guarantee this is not a malignancy discussed although unlikely given the stability of the cyst and negative CA 125.  Patient is comfortable with observation and will follow-up in the fall for her annual exam and repeat ultrasound.   Greater than 50% of my time was spent in direct face to face counseling and coordination of care with the patient.     [name], 3:06 PM 06/14/2017

## 2017-06-18 LAB — URINALYSIS, COMPLETE W/RFL CULTURE
BILIRUBIN URINE: NEGATIVE
Bacteria, UA: NONE SEEN /HPF
GLUCOSE, UA: NEGATIVE
Hgb urine dipstick: NEGATIVE
Hyaline Cast: NONE SEEN /LPF
Ketones, ur: NEGATIVE
LEUKOCYTE ESTERASE: NEGATIVE
NITRITES URINE, INITIAL: NEGATIVE
PROTEIN: NEGATIVE
RBC / HPF: NONE SEEN /HPF (ref 0–2)
Specific Gravity, Urine: 1.01 (ref 1.001–1.03)
WBC, UA: NONE SEEN /HPF (ref 0–5)
pH: 7 (ref 5.0–8.0)

## 2017-06-18 LAB — NO CULTURE INDICATED

## 2017-07-24 DIAGNOSIS — E039 Hypothyroidism, unspecified: Secondary | ICD-10-CM | POA: Diagnosis not present

## 2017-07-24 DIAGNOSIS — E611 Iron deficiency: Secondary | ICD-10-CM | POA: Diagnosis not present

## 2017-08-15 DIAGNOSIS — H25013 Cortical age-related cataract, bilateral: Secondary | ICD-10-CM | POA: Diagnosis not present

## 2017-08-15 DIAGNOSIS — H2513 Age-related nuclear cataract, bilateral: Secondary | ICD-10-CM | POA: Diagnosis not present

## 2017-08-15 DIAGNOSIS — H25043 Posterior subcapsular polar age-related cataract, bilateral: Secondary | ICD-10-CM | POA: Diagnosis not present

## 2017-08-15 DIAGNOSIS — H02839 Dermatochalasis of unspecified eye, unspecified eyelid: Secondary | ICD-10-CM | POA: Diagnosis not present

## 2017-08-15 DIAGNOSIS — H2511 Age-related nuclear cataract, right eye: Secondary | ICD-10-CM | POA: Diagnosis not present

## 2017-09-15 DIAGNOSIS — H2512 Age-related nuclear cataract, left eye: Secondary | ICD-10-CM | POA: Diagnosis not present

## 2017-09-15 DIAGNOSIS — H2511 Age-related nuclear cataract, right eye: Secondary | ICD-10-CM | POA: Diagnosis not present

## 2017-09-29 DIAGNOSIS — H2512 Age-related nuclear cataract, left eye: Secondary | ICD-10-CM | POA: Diagnosis not present

## 2017-10-31 DIAGNOSIS — H35341 Macular cyst, hole, or pseudohole, right eye: Secondary | ICD-10-CM | POA: Diagnosis not present

## 2017-11-22 ENCOUNTER — Other Ambulatory Visit: Payer: Self-pay | Admitting: *Deleted

## 2017-11-22 DIAGNOSIS — N83201 Unspecified ovarian cyst, right side: Secondary | ICD-10-CM

## 2017-11-24 ENCOUNTER — Other Ambulatory Visit: Payer: Self-pay | Admitting: Gynecology

## 2017-11-24 DIAGNOSIS — Z1231 Encounter for screening mammogram for malignant neoplasm of breast: Secondary | ICD-10-CM

## 2018-01-24 ENCOUNTER — Other Ambulatory Visit: Payer: Self-pay | Admitting: Gynecology

## 2018-01-24 ENCOUNTER — Encounter: Payer: Self-pay | Admitting: Gynecology

## 2018-01-24 ENCOUNTER — Ambulatory Visit: Payer: BLUE CROSS/BLUE SHIELD | Admitting: Gynecology

## 2018-01-24 ENCOUNTER — Encounter: Payer: BLUE CROSS/BLUE SHIELD | Admitting: Gynecology

## 2018-01-24 ENCOUNTER — Ambulatory Visit (INDEPENDENT_AMBULATORY_CARE_PROVIDER_SITE_OTHER): Payer: BLUE CROSS/BLUE SHIELD

## 2018-01-24 ENCOUNTER — Ambulatory Visit
Admission: RE | Admit: 2018-01-24 | Discharge: 2018-01-24 | Disposition: A | Discharge status: Home / Self Care | Payer: BLUE CROSS/BLUE SHIELD | Source: Ambulatory Visit | Attending: Gynecology | Admitting: Gynecology

## 2018-01-24 VITALS — BP 116/74 | Ht 66.0 in | Wt 142.0 lb

## 2018-01-24 DIAGNOSIS — Z1151 Encounter for screening for human papillomavirus (HPV): Secondary | ICD-10-CM

## 2018-01-24 DIAGNOSIS — N83201 Unspecified ovarian cyst, right side: Secondary | ICD-10-CM

## 2018-01-24 DIAGNOSIS — N952 Postmenopausal atrophic vaginitis: Secondary | ICD-10-CM | POA: Diagnosis not present

## 2018-01-24 DIAGNOSIS — N838 Other noninflammatory disorders of ovary, fallopian tube and broad ligament: Secondary | ICD-10-CM

## 2018-01-24 DIAGNOSIS — Z01419 Encounter for gynecological examination (general) (routine) without abnormal findings: Secondary | ICD-10-CM

## 2018-01-24 DIAGNOSIS — Z1231 Encounter for screening mammogram for malignant neoplasm of breast: Secondary | ICD-10-CM

## 2018-01-24 DIAGNOSIS — N839 Noninflammatory disorder of ovary, fallopian tube and broad ligament, unspecified: Secondary | ICD-10-CM

## 2018-01-24 NOTE — Progress Notes (Signed)
    Breanna Noble 30-Dec-1960 [id number]        57 y.o.  G2P2 for annual gynecologic exam.  Also for follow-up ultrasound.  Being followed for a vascular small cystic solid area on her right ovary initially discovered April 2018 for ultrasound for pelvic cramping.  Initially measured 16 x 13 x 14 mm with small calcification.  Follow-up ultrasounds have been stable.  Past medical history,surgical history, problem list, medications, allergies, family history and social history were all reviewed and documented as reviewed in the EPIC chart.  ROS:  Performed with pertinent positives and negatives included in the history, assessment and plan.   Additional significant findings : None   Exam: Caryn Bee assistant Vitals:   01/24/18 1102  BP: 116/74  Weight: 142 lb (64.4 kg)  Height: 5\' 6"  (1.676 m)   Body mass index is 22.92 kg/m.  General appearance:  Normal affect, orientation and appearance. Skin: Grossly normal HEENT: Without gross lesions.  No cervical or supraclavicular adenopathy. Thyroid normal.  Lungs:  Clear without wheezing, rales or rhonchi Cardiac: RR, without RMG Abdominal:  Soft, nontender, without masses, guarding, rebound, organomegaly or hernia Breasts:  Examined lying and sitting without masses, retractions, discharge or axillary adenopathy. Pelvic:  Ext, BUS, Vagina: With atrophic changes  Cervix: With atrophic changes.  Pap smear/HPV  Uterus: Anteverted, normal size, shape and contour, midline and mobile nontender   Adnexa: Without masses or tenderness    Anus and perineum: Normal   Rectovaginal: Normal sphincter tone without palpated masses or tenderness.   Ultrasound transvaginal shows uterus normal size and echotexture.  Endometrial echo 3.2 mm.  Left ovary normal.  Right ovary with continued presence of solid cystic area 15 x 13 x 15 mm with 4 mm calcification.  Positive peripheral flow.  Small 16 x 13 mm simple cyst.  Separate 7 x 7 mm cyst with small echogenic  foci.  Cul-de-sac negative  Assessment/Plan:  57 y.o. G2P2 female for annual gynecologic exam.   1. Postmenopausal/atrophic genital changes.  No significant menopausal symptoms or any vaginal bleeding. 2. 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  Remains unchanged from initial measurement.  We again discussed the differential up to and including cancer.  Patient understands I cannot guarantee this is not cancer but unlikely given the stability over 1-1/2 years observation.  Options for laparoscopic oophorectomy versus continued observation discussed.  Patient prefers observation at this time understanding the issues.  Will go ahead and recheck a CA 125 today and plan follow-up ultrasound in 6 months.  We discussed at some point we will have to decide when to stop following ultrasonographically if it remains stable.  If she changes her mind as far as surgery she will call. 3. Mammography 12/2017.  Continue with annual mammography next year.  Breast exam normal today. 4. Pap smear/HPV 10/2013.  Pap smear/HPV today.  We will continue screening per current screening guidelines with Pap smear/HPV every 5 years. 5. Colonoscopy 2014.  Repeat at their recommended interval. 6. Bone density never.  Will plan further into the menopause. 7. Health maintenance.  No routine lab work done as patient reports this done elsewhere.  Follow-up in 6 months for ultrasound.  Follow-up in 1 year for annual exam.   [name], 11:23 AM 01/24/2018

## 2018-01-24 NOTE — Addendum Note (Signed)
Addended by: Nelva Nay on: 01/24/2018 11:45 AM   Modules accepted: Orders

## 2018-01-24 NOTE — Patient Instructions (Addendum)
Follow-up for the scheduled ultrasound in 6 months.  Office will call you with the blood test results drawn today.

## 2018-01-25 LAB — PAP IG AND HPV HIGH-RISK: HPV DNA HIGH RISK: NOT DETECTED

## 2018-01-26 ENCOUNTER — Encounter: Payer: Self-pay | Admitting: Gynecology

## 2018-01-26 DIAGNOSIS — Z13220 Encounter for screening for lipoid disorders: Secondary | ICD-10-CM | POA: Diagnosis not present

## 2018-01-26 DIAGNOSIS — E039 Hypothyroidism, unspecified: Secondary | ICD-10-CM | POA: Diagnosis not present

## 2018-01-26 DIAGNOSIS — E611 Iron deficiency: Secondary | ICD-10-CM | POA: Diagnosis not present

## 2018-01-26 DIAGNOSIS — Z52008 Unspecified donor, other blood: Secondary | ICD-10-CM | POA: Diagnosis not present

## 2018-01-26 DIAGNOSIS — Z23 Encounter for immunization: Secondary | ICD-10-CM | POA: Diagnosis not present

## 2018-01-26 DIAGNOSIS — Z0000 Encounter for general adult medical examination without abnormal findings: Secondary | ICD-10-CM | POA: Diagnosis not present

## 2018-01-26 DIAGNOSIS — Z789 Other specified health status: Secondary | ICD-10-CM | POA: Diagnosis not present

## 2018-01-26 LAB — CA 125: CA 125: 10 U/mL (ref <35)

## 2018-06-01 DIAGNOSIS — H35431 Paving stone degeneration of retina, right eye: Secondary | ICD-10-CM | POA: Diagnosis not present

## 2018-06-01 DIAGNOSIS — H35412 Lattice degeneration of retina, left eye: Secondary | ICD-10-CM | POA: Diagnosis not present

## 2018-06-01 DIAGNOSIS — H43812 Vitreous degeneration, left eye: Secondary | ICD-10-CM | POA: Diagnosis not present

## 2018-07-11 ENCOUNTER — Other Ambulatory Visit: Payer: Self-pay

## 2018-07-11 ENCOUNTER — Encounter: Payer: Self-pay | Admitting: Gynecology

## 2018-07-11 ENCOUNTER — Ambulatory Visit (INDEPENDENT_AMBULATORY_CARE_PROVIDER_SITE_OTHER): Payer: BLUE CROSS/BLUE SHIELD

## 2018-07-11 ENCOUNTER — Ambulatory Visit: Payer: BLUE CROSS/BLUE SHIELD | Admitting: Gynecology

## 2018-07-11 VITALS — BP 118/76

## 2018-07-11 DIAGNOSIS — N83201 Unspecified ovarian cyst, right side: Secondary | ICD-10-CM

## 2018-07-11 NOTE — Progress Notes (Signed)
    Breanna Noble May 01, 1961 [id number]        58 y.o.  G2P2 presents for follow-up ultrasound.  Has a history of persistent right ovarian mass with positive flow that is been followed for a number of years unchanged.  Most recently in September had a CA 125 of 10 and an ultrasound showing a right ovarian cystic solid area at 15 mm with a 4 mm calcification.  Positive peripheral flow.  Several other small cystic changes.  Left ovary was normal.  Is having no pain or other symptoms.  Past medical history,surgical history, problem list, medications, allergies, family history and social history were all reviewed and documented in the EPIC chart.  Directed ROS with pertinent positives and negatives documented in the history of present illness/assessment and plan.  Exam: Vitals:   07/11/18 1425  BP: 118/76   General appearance:  Normal  Ultrasound transvaginal shows uterus normal size and echotexture.  Endometrial echo 0.7 mm.  Left ovary normal.  Right ovary with continued presence of a solid cystic mass 15 x 16 x 14 mm with increased color flow Doppler.  6 mm calcification lateral to the mass noted.  Small echo-free thin-walled cyst 20 x 19 mm and separate 8 mm cyst.  Cul-de-sac negative.    Assessment/Plan:  58 y.o. G2P2 with persistent right ovarian cystic solid mass.  Right ovarian volume was 5.7 cc today.  Right ovarian volume in 2019 was 7.9 cc.  We again discussed the issues of missed pathology to include ovarian cancer and the options for laparoscopic BSO now versus continued observation.  The risks versus benefits of both choices were reviewed.  The patient at this point prefers expectant management and will follow-up in the fall when she is due for her annual exam.  We will discuss whether we want to continue with ultrasounds at that time recognizing that she is been followed for several years and this is remained unchanged.    Breanna Auerbach MD, 2:59 PM 07/11/2018

## 2018-07-11 NOTE — Patient Instructions (Addendum)
Follow-up this coming fall for annual exam.  We will decide about ultrasound at that time.

## 2018-07-25 ENCOUNTER — Other Ambulatory Visit: Payer: BLUE CROSS/BLUE SHIELD

## 2018-07-25 ENCOUNTER — Ambulatory Visit: Payer: BLUE CROSS/BLUE SHIELD | Admitting: Gynecology

## 2019-01-03 ENCOUNTER — Other Ambulatory Visit: Payer: Self-pay | Admitting: Gynecology

## 2019-01-03 DIAGNOSIS — Z1231 Encounter for screening mammogram for malignant neoplasm of breast: Secondary | ICD-10-CM

## 2019-01-29 ENCOUNTER — Encounter: Payer: Self-pay | Admitting: Gynecology

## 2019-01-31 DIAGNOSIS — E039 Hypothyroidism, unspecified: Secondary | ICD-10-CM | POA: Diagnosis not present

## 2019-01-31 DIAGNOSIS — E611 Iron deficiency: Secondary | ICD-10-CM | POA: Diagnosis not present

## 2019-01-31 DIAGNOSIS — Z789 Other specified health status: Secondary | ICD-10-CM | POA: Diagnosis not present

## 2019-01-31 DIAGNOSIS — Z52008 Unspecified donor, other blood: Secondary | ICD-10-CM | POA: Diagnosis not present

## 2019-02-11 ENCOUNTER — Other Ambulatory Visit: Payer: Self-pay

## 2019-02-11 ENCOUNTER — Encounter: Payer: Self-pay | Admitting: Gynecology

## 2019-02-11 ENCOUNTER — Ambulatory Visit (INDEPENDENT_AMBULATORY_CARE_PROVIDER_SITE_OTHER): Payer: BC Managed Care – PPO | Admitting: Gynecology

## 2019-02-11 VITALS — BP 116/76 | Ht 66.0 in | Wt 145.0 lb

## 2019-02-11 DIAGNOSIS — Z01419 Encounter for gynecological examination (general) (routine) without abnormal findings: Secondary | ICD-10-CM | POA: Diagnosis not present

## 2019-02-11 DIAGNOSIS — N952 Postmenopausal atrophic vaginitis: Secondary | ICD-10-CM

## 2019-02-11 DIAGNOSIS — Z23 Encounter for immunization: Secondary | ICD-10-CM | POA: Diagnosis not present

## 2019-02-11 DIAGNOSIS — N83201 Unspecified ovarian cyst, right side: Secondary | ICD-10-CM

## 2019-02-11 NOTE — Progress Notes (Signed)
    Breanna Noble 1960/09/22 [id number]        58 y.o.  G2P2 for annual gynecologic exam.  Without gynecologic complaints.  History of a right ovarian cystic area as discussed below.  Past medical history,surgical history, problem list, medications, allergies, family history and social history were all reviewed and documented as reviewed in the EPIC chart.  ROS:  Performed with pertinent positives and negatives included in the history, assessment and plan.   Additional significant findings : None   Exam: Caryn Bee assistant Vitals:   02/11/19 1448  BP: 116/76  Weight: 145 lb (65.8 kg)  Height: 5\' 6"  (1.676 m)   Body mass index is 23.4 kg/m.  General appearance:  Normal affect, orientation and appearance. Skin: Grossly normal HEENT: Without gross lesions.  No cervical or supraclavicular adenopathy. Thyroid normal.  Lungs:  Clear without wheezing, rales or rhonchi Cardiac: RR, without RMG Abdominal:  Soft, nontender, without masses, guarding, rebound, organomegaly or hernia Breasts:  Examined lying and sitting without masses, retractions, discharge or axillary adenopathy. Pelvic:  Ext, BUS, Vagina: With atrophic changes  Cervix: With atrophic changes  Uterus: Anteverted, normal size, shape and contour, midline and mobile nontender   Adnexa: Without masses or tenderness    Anus and perineum: Normal   Rectovaginal: Normal sphincter tone without palpated masses or tenderness.    Assessment/Plan:  58 y.o. G2P2 female for annual gynecologic exam.   1. Postmenopausal.  No significant menopausal symptoms or any vaginal bleeding. 2. 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  Has remained unchanged in the 15 mm range over several years observation.  Normal CA 125 in the past in the 8-10 range.  Exam today is normal.  No symptoms.  Options for management to include expectant management, ultrasound, laparoscopic oophorectomy all discussed.  At this  point the patient is very comfortable with no further intervention understanding and accepting the risks of missed pathology to include ovarian cancer. 3. Mammography scheduled next month.  Breast exam normal today. 4. Pap smear/HPV 2019.  No Pap smear done today.  No history of significant abnormal Pap smears.  Plan repeat Pap smear/HPV at 5-year interval per current screening guidelines. 5. Colonoscopy 2014.  Repeat at their recommended interval. 6. DEXA never.  Will plan at age 20. 91. Health maintenance.  No routine lab work done as patient does this elsewhere.  Follow-up 1 year, sooner as needed.   [name], 3:07 PM 02/11/2019

## 2019-02-11 NOTE — Patient Instructions (Signed)
Follow-up in 1 year for annual exam, sooner if any issues. 

## 2019-02-18 ENCOUNTER — Ambulatory Visit: Payer: BLUE CROSS/BLUE SHIELD

## 2019-03-13 ENCOUNTER — Ambulatory Visit
Admission: RE | Admit: 2019-03-13 | Discharge: 2019-03-13 | Disposition: A | Discharge status: Home / Self Care | Payer: BLUE CROSS/BLUE SHIELD | Source: Ambulatory Visit | Attending: Gynecology | Admitting: Gynecology

## 2019-03-13 ENCOUNTER — Other Ambulatory Visit: Payer: Self-pay

## 2019-03-13 DIAGNOSIS — Z1231 Encounter for screening mammogram for malignant neoplasm of breast: Secondary | ICD-10-CM

## 2019-08-31 DIAGNOSIS — C801 Malignant (primary) neoplasm, unspecified: Secondary | ICD-10-CM

## 2019-08-31 HISTORY — DX: Malignant (primary) neoplasm, unspecified: C80.1

## 2019-09-30 DIAGNOSIS — R17 Unspecified jaundice: Secondary | ICD-10-CM | POA: Diagnosis not present

## 2019-10-02 ENCOUNTER — Other Ambulatory Visit (HOSPITAL_BASED_OUTPATIENT_CLINIC_OR_DEPARTMENT_OTHER): Payer: Self-pay | Admitting: Family Medicine

## 2019-10-02 DIAGNOSIS — R17 Unspecified jaundice: Secondary | ICD-10-CM

## 2019-10-02 DIAGNOSIS — R7309 Other abnormal glucose: Secondary | ICD-10-CM | POA: Diagnosis not present

## 2019-10-03 ENCOUNTER — Other Ambulatory Visit: Payer: Self-pay

## 2019-10-03 ENCOUNTER — Ambulatory Visit (HOSPITAL_BASED_OUTPATIENT_CLINIC_OR_DEPARTMENT_OTHER)
Admission: RE | Admit: 2019-10-03 | Discharge: 2019-10-03 | Disposition: A | Discharge status: Home / Self Care | Payer: BC Managed Care – PPO | Source: Ambulatory Visit | Attending: Family Medicine | Admitting: Family Medicine

## 2019-10-03 DIAGNOSIS — K828 Other specified diseases of gallbladder: Secondary | ICD-10-CM | POA: Diagnosis not present

## 2019-10-03 DIAGNOSIS — R17 Unspecified jaundice: Secondary | ICD-10-CM | POA: Diagnosis not present

## 2019-10-04 ENCOUNTER — Other Ambulatory Visit: Payer: Self-pay | Admitting: Family Medicine

## 2019-10-04 DIAGNOSIS — K8689 Other specified diseases of pancreas: Secondary | ICD-10-CM

## 2019-10-07 ENCOUNTER — Other Ambulatory Visit: Payer: Self-pay

## 2019-10-07 ENCOUNTER — Ambulatory Visit: Payer: BC Managed Care – PPO

## 2019-10-07 DIAGNOSIS — K869 Disease of pancreas, unspecified: Secondary | ICD-10-CM | POA: Diagnosis not present

## 2019-10-07 DIAGNOSIS — K8689 Other specified diseases of pancreas: Secondary | ICD-10-CM

## 2019-10-07 MED ORDER — GADOBUTROL 1 MMOL/ML IV SOLN
6.5000 mL | Freq: Once | INTRAVENOUS | Status: AC | PRN
  Administered 2019-10-07: 6.5 mL via INTRAVENOUS

## 2019-10-08 ENCOUNTER — Other Ambulatory Visit (HOSPITAL_COMMUNITY)
Admission: RE | Admit: 2019-10-08 | Discharge: 2019-10-08 | Disposition: A | Discharge status: Home / Self Care | Payer: BC Managed Care – PPO | Source: Ambulatory Visit | Attending: Gastroenterology | Admitting: Gastroenterology

## 2019-10-08 ENCOUNTER — Other Ambulatory Visit: Payer: Self-pay | Admitting: Gastroenterology

## 2019-10-08 DIAGNOSIS — Z01812 Encounter for preprocedural laboratory examination: Secondary | ICD-10-CM | POA: Insufficient documentation

## 2019-10-08 DIAGNOSIS — Z20822 Contact with and (suspected) exposure to covid-19: Secondary | ICD-10-CM | POA: Insufficient documentation

## 2019-10-08 LAB — SARS CORONAVIRUS 2 (TAT 6-24 HRS): SARS Coronavirus 2: NEGATIVE

## 2019-10-09 ENCOUNTER — Encounter (HOSPITAL_COMMUNITY): Payer: Self-pay | Admitting: Gastroenterology

## 2019-10-09 ENCOUNTER — Other Ambulatory Visit: Payer: Self-pay

## 2019-10-09 NOTE — Progress Notes (Signed)
Pt denies SOB, chest pain, and being under the care of a cardiologist. Pt stated that PCP is Dr. Ulanda Edison. Pt denies having a stress test, echo and cardiac cath. Pt made aware to stop taking  Aspirin (unless otherwise advised by surgeon), vitamins, fish oil and herbal medications. Do not take any NSAIDs ie: Ibuprofen, Advil, Naproxen (Aleve), Motrin, BC and Goody Powder. Pt reminded to quarantine. Pt verbalized understanding of all pre-op instructions.

## 2019-10-10 ENCOUNTER — Encounter (HOSPITAL_COMMUNITY): Payer: Self-pay | Admitting: Gastroenterology

## 2019-10-10 ENCOUNTER — Ambulatory Visit (HOSPITAL_COMMUNITY): Payer: BC Managed Care – PPO | Admitting: Anesthesiology

## 2019-10-10 ENCOUNTER — Encounter (HOSPITAL_COMMUNITY)
Admission: RE | Disposition: A | Discharge status: Home / Self Care | Payer: Self-pay | Source: Home / Self Care | Attending: Gastroenterology

## 2019-10-10 ENCOUNTER — Ambulatory Visit (HOSPITAL_COMMUNITY)
Admission: RE | Admit: 2019-10-10 | Discharge: 2019-10-10 | Disposition: A | Discharge status: Home / Self Care | Payer: BC Managed Care – PPO | Attending: Gastroenterology | Admitting: Gastroenterology

## 2019-10-10 ENCOUNTER — Ambulatory Visit (HOSPITAL_COMMUNITY): Payer: BC Managed Care – PPO

## 2019-10-10 ENCOUNTER — Other Ambulatory Visit: Payer: Self-pay

## 2019-10-10 DIAGNOSIS — C250 Malignant neoplasm of head of pancreas: Secondary | ICD-10-CM | POA: Insufficient documentation

## 2019-10-10 DIAGNOSIS — Z833 Family history of diabetes mellitus: Secondary | ICD-10-CM | POA: Insufficient documentation

## 2019-10-10 DIAGNOSIS — R634 Abnormal weight loss: Secondary | ICD-10-CM | POA: Diagnosis not present

## 2019-10-10 DIAGNOSIS — Z8249 Family history of ischemic heart disease and other diseases of the circulatory system: Secondary | ICD-10-CM | POA: Insufficient documentation

## 2019-10-10 DIAGNOSIS — K838 Other specified diseases of biliary tract: Secondary | ICD-10-CM | POA: Diagnosis not present

## 2019-10-10 DIAGNOSIS — Z800 Family history of malignant neoplasm of digestive organs: Secondary | ICD-10-CM | POA: Diagnosis not present

## 2019-10-10 DIAGNOSIS — Z9842 Cataract extraction status, left eye: Secondary | ICD-10-CM | POA: Insufficient documentation

## 2019-10-10 DIAGNOSIS — Z808 Family history of malignant neoplasm of other organs or systems: Secondary | ICD-10-CM | POA: Insufficient documentation

## 2019-10-10 DIAGNOSIS — E039 Hypothyroidism, unspecified: Secondary | ICD-10-CM | POA: Insufficient documentation

## 2019-10-10 DIAGNOSIS — I899 Noninfective disorder of lymphatic vessels and lymph nodes, unspecified: Secondary | ICD-10-CM | POA: Diagnosis not present

## 2019-10-10 DIAGNOSIS — K831 Obstruction of bile duct: Secondary | ICD-10-CM | POA: Insufficient documentation

## 2019-10-10 DIAGNOSIS — Z79899 Other long term (current) drug therapy: Secondary | ICD-10-CM | POA: Diagnosis not present

## 2019-10-10 DIAGNOSIS — R748 Abnormal levels of other serum enzymes: Secondary | ICD-10-CM | POA: Diagnosis not present

## 2019-10-10 DIAGNOSIS — K8689 Other specified diseases of pancreas: Secondary | ICD-10-CM | POA: Diagnosis not present

## 2019-10-10 DIAGNOSIS — Z9841 Cataract extraction status, right eye: Secondary | ICD-10-CM | POA: Diagnosis not present

## 2019-10-10 HISTORY — DX: Other specified postprocedural states: Z98.890

## 2019-10-10 HISTORY — PX: SPHINCTEROTOMY: SHX5544

## 2019-10-10 HISTORY — PX: EUS: SHX5427

## 2019-10-10 HISTORY — PX: PANCREATIC STENT PLACEMENT: SHX5539

## 2019-10-10 HISTORY — PX: BILIARY STENT PLACEMENT: SHX5538

## 2019-10-10 HISTORY — PX: ERCP: SHX5425

## 2019-10-10 HISTORY — DX: Other specified diseases of pancreas: K86.89

## 2019-10-10 HISTORY — DX: Other specified postprocedural states: R11.2

## 2019-10-10 HISTORY — PX: FINE NEEDLE ASPIRATION: SHX5430

## 2019-10-10 HISTORY — PX: ESOPHAGOGASTRODUODENOSCOPY: SHX5428

## 2019-10-10 SURGERY — UPPER ENDOSCOPIC ULTRASOUND (EUS) LINEAR
Anesthesia: Monitor Anesthesia Care

## 2019-10-10 MED ORDER — MIDAZOLAM HCL 5 MG/5ML IJ SOLN
INTRAMUSCULAR | Status: DC | PRN
  Administered 2019-10-10: 2 mg via INTRAVENOUS

## 2019-10-10 MED ORDER — SODIUM CHLORIDE 0.9 % IV SOLN
INTRAVENOUS | Status: DC | PRN
  Administered 2019-10-10: 40 mL

## 2019-10-10 MED ORDER — CIPROFLOXACIN IN D5W 400 MG/200ML IV SOLN
INTRAVENOUS | Status: AC
  Filled 2019-10-10: qty 200

## 2019-10-10 MED ORDER — PROPOFOL 10 MG/ML IV BOLUS
INTRAVENOUS | Status: DC | PRN
  Administered 2019-10-10: 120 mg via INTRAVENOUS

## 2019-10-10 MED ORDER — INDOMETHACIN 50 MG RE SUPP
RECTAL | Status: DC | PRN
  Administered 2019-10-10: 100 mg via RECTAL

## 2019-10-10 MED ORDER — FENTANYL CITRATE (PF) 100 MCG/2ML IJ SOLN
INTRAMUSCULAR | Status: DC | PRN
  Administered 2019-10-10: 100 ug via INTRAVENOUS
  Administered 2019-10-10 (×2): 50 ug via INTRAVENOUS

## 2019-10-10 MED ORDER — LACTATED RINGERS IV SOLN
INTRAVENOUS | Status: DC
  Administered 2019-10-10: 1000 mL via INTRAVENOUS

## 2019-10-10 MED ORDER — INDOMETHACIN 50 MG RE SUPP
RECTAL | Status: AC
  Filled 2019-10-10: qty 2

## 2019-10-10 MED ORDER — ONDANSETRON 4 MG PO TBDP
4.0000 mg | ORAL_TABLET | Freq: Once | ORAL | Status: AC
  Administered 2019-10-10: 4 mg via ORAL
  Filled 2019-10-10 (×2): qty 1

## 2019-10-10 MED ORDER — ONDANSETRON HCL 4 MG/2ML IJ SOLN
INTRAMUSCULAR | Status: DC | PRN
  Administered 2019-10-10: 4 mg via INTRAVENOUS

## 2019-10-10 MED ORDER — DEXAMETHASONE SODIUM PHOSPHATE 10 MG/ML IJ SOLN
INTRAMUSCULAR | Status: DC | PRN
Start: 2019-10-10 — End: 2019-10-10
  Administered 2019-10-10: 10 mg via INTRAVENOUS

## 2019-10-10 MED ORDER — ROCURONIUM BROMIDE 10 MG/ML (PF) SYRINGE
PREFILLED_SYRINGE | INTRAVENOUS | Status: DC | PRN
  Administered 2019-10-10: 20 mg via INTRAVENOUS
  Administered 2019-10-10: 50 mg via INTRAVENOUS

## 2019-10-10 MED ORDER — CIPROFLOXACIN IN D5W 400 MG/200ML IV SOLN
400.0000 mg | Freq: Once | INTRAVENOUS | Status: AC
  Administered 2019-10-10: 400 mg via INTRAVENOUS

## 2019-10-10 MED ORDER — GLUCAGON HCL RDNA (DIAGNOSTIC) 1 MG IJ SOLR
INTRAMUSCULAR | Status: AC
  Filled 2019-10-10: qty 1

## 2019-10-10 MED ORDER — LIDOCAINE 2% (20 MG/ML) 5 ML SYRINGE
INTRAMUSCULAR | Status: DC | PRN
  Administered 2019-10-10: 60 mg via INTRAVENOUS

## 2019-10-10 MED ORDER — PHENYLEPHRINE 40 MCG/ML (10ML) SYRINGE FOR IV PUSH (FOR BLOOD PRESSURE SUPPORT)
PREFILLED_SYRINGE | INTRAVENOUS | Status: DC | PRN
  Administered 2019-10-10 (×2): 80 ug via INTRAVENOUS

## 2019-10-10 MED ORDER — SUGAMMADEX SODIUM 200 MG/2ML IV SOLN
INTRAVENOUS | Status: DC | PRN
Start: 2019-10-10 — End: 2019-10-10
  Administered 2019-10-10: 100 mg via INTRAVENOUS

## 2019-10-10 NOTE — Transfer of Care (Signed)
Immediate Anesthesia Transfer of Care Note  Patient: Breanna Noble  Procedure(s) Performed: UPPER ENDOSCOPIC ULTRASOUND (EUS) LINEAR (N/A ) ENDOSCOPIC RETROGRADE CHOLANGIOPANCREATOGRAPHY (ERCP) (N/A ) ESOPHAGOGASTRODUODENOSCOPY (EGD) (N/A ) FINE NEEDLE ASPIRATION (FNA) LINEAR PANCREATIC STENT PLACEMENT SPHINCTEROTOMY BILIARY STENT PLACEMENT  Patient Location: Endoscopy Unit  Anesthesia Type:General  Level of Consciousness: awake, alert  and oriented  Airway & Oxygen Therapy: Patient Spontanous Breathing and Patient connected to face mask oxygen  Post-op Assessment: Report given to RN and Post -op Vital signs reviewed and stable  Post vital signs: Reviewed and stable  Last Vitals:  Vitals Value Taken Time  BP 140/71 10/10/19 1614  Temp    Pulse 73 10/10/19 1615  Resp 18 10/10/19 1615  SpO2 100 % 10/10/19 1615  Vitals shown include unvalidated device data.  Last Pain:  Vitals:   10/10/19 1215  TempSrc: Oral  PainSc: 0-No pain         Complications: No complications documented.

## 2019-10-10 NOTE — Anesthesia Preprocedure Evaluation (Addendum)
Anesthesia Evaluation  Patient identified by MRN, date of birth, ID band Patient awake    Reviewed: Allergy & Precautions, NPO status , Patient's Chart, lab work & pertinent test results  History of Anesthesia Complications (+) PONV and history of anesthetic complications  Airway Mallampati: II  TM Distance: >3 FB Neck ROM: Full    Dental no notable dental hx. (+) Teeth Intact   Pulmonary neg pulmonary ROS,    Pulmonary exam normal breath sounds clear to auscultation       Cardiovascular negative cardio ROS Normal cardiovascular exam Rhythm:Regular Rate:Normal     Neuro/Psych negative neurological ROS  negative psych ROS   GI/Hepatic Jaundice Abnormal X-rays Pancreatic mass   Endo/Other  Hypothyroidism   Renal/GU negative Renal ROS  negative genitourinary   Musculoskeletal negative musculoskeletal ROS (+)   Abdominal   Peds  Hematology negative hematology ROS (+)   Anesthesia Other Findings   Reproductive/Obstetrics                             Anesthesia Physical Anesthesia Plan  ASA: II  Anesthesia Plan: General   Post-op Pain Management:    Induction: Intravenous  PONV Risk Score and Plan: 4 or greater and Ondansetron, Propofol infusion, Treatment may vary due to age or medical condition and Dexamethasone  Airway Management Planned: Oral ETT  Additional Equipment:   Intra-op Plan:   Post-operative Plan: Extubation in OR  Informed Consent: I have reviewed the patients History and Physical, chart, labs and discussed the procedure including the risks, benefits and alternatives for the proposed anesthesia with the patient or authorized representative who has indicated his/her understanding and acceptance.     Dental advisory given  Plan Discussed with: CRNA and Anesthesiologist  Anesthesia Plan Comments:        Anesthesia Quick Evaluation

## 2019-10-10 NOTE — Op Note (Signed)
[facility] Patient Name: Breanna Noble Procedure Date : 10/10/2019 MRN: [id number] Attending MD: Arta Silence , MD Date of Birth: Feb 27, 1961 CSN: [id number] Age: 59 Admit Type: Outpatient Procedure:                ERCP Indications:              Abnormal MRCP, Biliary dilation on Ultrasound,                            Elevated liver enzymes Providers:                Arta Silence, MD, Benay Pillow, RN, Vista Lawman, RN, Cletis Athens, Technician, Rhae Lerner,                            CRNA Referring MD:             Dr. Yaakov Guthrie Medicines:                General Anesthesia, Cipro 400 mg IV, Indomethacin                            397 mg PR Complications:            No immediate complications. Estimated Blood Loss:     Estimated blood loss was minimal. Procedure:                Pre-Anesthesia Assessment:                           - Prior to the procedure, a History and Physical                            was performed, and patient medications and                            allergies were reviewed. The patient's tolerance of                            previous anesthesia was also reviewed. The risks                            and benefits of the procedure and the sedation                            options and risks were discussed with the patient.                            All questions were answered, and informed consent                            was obtained. Prior Anticoagulants: The patient has                            taken  no previous anticoagulant or antiplatelet                            agents. ASA Grade Assessment: II - A patient with                            mild systemic disease. After reviewing the risks                            and benefits, the patient was deemed in                            satisfactory condition to undergo the procedure.                           - Prior to the procedure, a History and Physical                             was performed, and patient medications and                            allergies were reviewed. The patient's tolerance of                            previous anesthesia was also reviewed. The risks                            and benefits of the procedure and the sedation                            options and risks were discussed with the patient.                            All questions were answered, and informed consent                            was obtained. Prior Anticoagulants: The patient has                            taken no previous anticoagulant or antiplatelet                            agents. ASA Grade Assessment: II - A patient with                            mild systemic disease. After reviewing the risks                            and benefits, the patient was deemed in                            satisfactory condition to undergo the procedure.  After obtaining informed consent, the scope was                            passed under direct vision. Throughout the                            procedure, the patient's blood pressure, pulse, and                            oxygen saturations were monitored continuously. The                            [id number]-[id number] ([id number] Olympus Duodensocope was                            introduced through the mouth, and used to inject                            contrast into and used to inject contrast into the                            bile duct and ventral pancreatic duct. The ERCP was                            accomplished without difficulty. The patient                            tolerated the procedure well. Scope In: Scope Out: Findings:      The scout film was normal. The major papilla was normal. Preferential       cannulation of ventral pancreatic duct was noted. A wire was passed into       the ventral pancreatic duct. Small amount of dye injected to confirm       positioning in  ventral pancreatic duct. One 5 Fr by 5 cm temporary stent       with a single external pigtail and no internal flaps was placed 5 cm       into the ventral pancreatic duct. Clear fluid flowed through the stent.       The stent was in good position. Subsequently, the biliary tree was       readily deeply cannulated. 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 To facilitate stent       access, a 4 mm biliary sphincterotomy was made with a traction       (standard) sphincterotome using blended current. There was no       post-sphincterotomy bleeding. One 10 Fr by 6 cm covered metal stent with       no external flaps and no internal flaps was placed 5.5 cm into the       common bile duct. Dark bile readily flowed through the stent. The stent       was in good position. Impression:               - The major  papilla appeared normal.                           - One temporary stent was placed into the ventral                            pancreatic duct.                           - A biliary sphincterotomy was performed.                           - One covered metal stent was placed into the                            common bile duct. Moderate Sedation:      None Recommendation:           - Avoid aspirin and nonsteroidal anti-inflammatory                            medicines for 3 days.                           - Await cytology results.                           - Watch for pancreatitis, bleeding, perforation,                            and cholangitis.                           - Observe patient's clinical course.                           - Refer to a surgeon at appointment to be scheduled.                           - Refer to an oncologist at appointment to be                            scheduled.                           - Return to GI clinic PRN.                            - Return to referring physician as previously                            scheduled. Procedure Code(s):        --- Professional ---                           [phone], Endoscopic retrograde  cholangiopancreatography (ERCP); with placement of                            endoscopic stent into biliary or pancreatic duct,                            including pre- and post-dilation and guide wire                            passage, when performed, including sphincterotomy,                            when performed, each stent                           11216, 71, Endoscopic retrograde                            cholangiopancreatography (ERCP); with placement of                            endoscopic stent into biliary or pancreatic duct,                            including pre- and post-dilation and guide wire                            passage, when performed, including sphincterotomy,                            when performed, each stent Diagnosis Code(s):        --- Professional ---                           R74.8, Abnormal levels of other serum enzymes                           K83.8, Other specified diseases of biliary tract                           R93.2, Abnormal findings on diagnostic imaging of                            liver and biliary tract CPT copyright 2019 American Medical Association. All rights reserved. The codes documented in this report are preliminary and upon coder review may  be revised to meet current compliance requirements. Arta Silence, MD 10/10/2019 4:27:18 PM This report has been signed electronically. Number of Addenda: 0

## 2019-10-10 NOTE — Discharge Instructions (Signed)

## 2019-10-10 NOTE — Anesthesia Postprocedure Evaluation (Signed)
Anesthesia Post Note  Patient: FARON WHITELOCK  Procedure(s) Performed: UPPER ENDOSCOPIC ULTRASOUND (EUS) LINEAR (N/A ) ENDOSCOPIC RETROGRADE CHOLANGIOPANCREATOGRAPHY (ERCP) (N/A ) ESOPHAGOGASTRODUODENOSCOPY (EGD) (N/A ) FINE NEEDLE ASPIRATION (FNA) LINEAR PANCREATIC [place]     Patient location during evaluation: Endoscopy Anesthesia Type: General Level of consciousness: awake and alert Pain management: pain level controlled Vital Signs Assessment: post-procedure vital signs reviewed and stable Respiratory status: spontaneous breathing, nonlabored ventilation, respiratory function stable and patient connected to nasal cannula oxygen Cardiovascular status: blood pressure returned to baseline and stable Postop Assessment: no apparent nausea or vomiting Anesthetic complications: no   No complications documented.  Last Vitals:  Vitals:   10/10/19 1643 10/10/19 1645  BP:  136/68  Pulse: 80 81  Resp: 18 20  Temp:    SpO2: 98% 100%    Last Pain:  Vitals:   10/10/19 1645  TempSrc:   PainSc: 0-No pain                 Catalina Gravel

## 2019-10-10 NOTE — H&P (Signed)
[facility] Admission Note  Chief Complaint: Obstructive jaundice  HPI: Breanna Noble is an 59 y.o. female whom we've been asked to see for obstructive jaundice.  15 lb weight loss.  Some abdominal soreness, but no abdominal pain.  Became jaundiced about 1-2 weeks ago.  Imaging shows dilated bile duct and LFTs elevated.  MRI showed suspected mass in uncinate pancreas.  Past Medical History:  Diagnosis Date   Cataracts, bilateral    Diverticulosis 12/07/2001   found on colonoscopy   Hypothyroid 10/2007   Macular hole    Pancreatic mass    PONV (postoperative nausea and vomiting)     Past Surgical History:  Procedure Laterality Date   CATARACT EXTRACTION, BILATERAL     COLONOSCOPY     Macular hole surg     TONSILLECTOMY      Medications Prior to Admission  Medication Sig Dispense Refill   Ascorbic Acid (VITAMIN C PO) Take 1,000 mg by mouth daily.      B Complex Vitamins (B-COMPLEX/B-12 PO) Take by mouth.     Calcium Carb-Cholecalciferol (CALCIUM 1000 + D PO) Take by mouth.     diphenhydrAMINE (BENADRYL) 25 MG tablet Take 25 mg by mouth at bedtime.     ferrous sulfate 325 (65 FE) MG tablet Take 325 mg by mouth daily with breakfast.     levothyroxine (SYNTHROID, LEVOTHROID) 50 MCG tablet Take 1 tablet (50 mcg total) by mouth daily. 90 tablet 4   Multiple Vitamin (MULTIVITAMIN PO) Take by mouth.       mupirocin ointment (BACTROBAN) 2 % Place 1 application into the nose daily as needed (sore in nose).      Allergies: No Known Allergies  Family History  Problem Relation Age of Onset   Hypertension Father    Heart disease Father    Cancer Maternal Grandmother        PANCREATIC   Heart disease Maternal Grandfather    Diabetes Paternal Grandfather    Cancer Maternal Uncle        Colon   Other Mother        Brain tumor benign   Breast cancer Neg Hx     Social History:  reports that she has never smoked. She has never used smokeless tobacco.  She reports that she does not drink alcohol and does not use drugs.   ROS: As per HPI, all others negative   Blood pressure (!) 149/62, temperature 98.4 F (36.9 C), temperature source Oral, resp. rate (!) 25, height 5' 5.75" (1.67 m), weight 57.2 kg, last menstrual period 11/22/2013, SpO2 100 %. General appearance: NAD HEENT:  Scleral icterus SKIN:  Jaundiced ABD:  Soft, non-tender  Results for orders placed or performed during the hospital encounter of 10/08/19 (from the past 48 hour(s))  SARS CORONAVIRUS 2 (TAT 6-24 HRS) Nasopharyngeal Nasopharyngeal Swab     Status: None   Collection Time: 10/08/19  3:11 PM   Specimen: Nasopharyngeal Swab  Result Value Ref Range   SARS Coronavirus 2 NEGATIVE NEGATIVE    Comment: (NOTE) SARS-CoV-2 target nucleic acids are NOT DETECTED. The SARS-CoV-2 RNA is generally detectable in upper and lower respiratory specimens during the acute phase of infection. Negative results do not preclude SARS-CoV-2 infection, do not rule out co-infections with other pathogens, and should not be used as the sole basis for treatment or other patient management decisions. Negative results must be combined with clinical observations, patient history, and epidemiological information. The expected result is Negative. Fact Sheet  for Patients: SugarRoll.be Fact Sheet for Healthcare Providers: https://www.woods-mathews.com/ This test is not yet approved or cleared by the [place] FDA and  has been authorized for detection and/or diagnosis of SARS-CoV-2 by FDA under an Emergency Use Authorization (EUA). This EUA will remain  in effect (meaning this test can be used) for the duration of the COVID-19 declaration under Section 56 4(b)(1) of the Act, 21 U.S.C. section 360bbb-3(b)(1), unless the authorization is terminated or revoked sooner. Performed at [facility], [place] [address] [place], [place] [address]    No  results found.  Assessment/Plan  1.  Obstructive jaundice. 2.  Pancreatic mass. 3.  Endoscopic ultrasound with suspected fine needle aspiration biopsies. 4.  Risks (bleeding, infection, bowel perforation that could require surgery, sedation-related changes in cardiopulmonary systems), benefits (identification and possible treatment of source of symptoms, exclusion of certain causes of symptoms), and alternatives (watchful waiting, radiographic imaging studies, empiric medical treatment) of upper endoscopy with ultrasound and possible fine needle aspiration biopsies (EUS +/- FNA) were explained to patient/family in detail and patient wishes to proceed. 5.  Endoscopic retrograde cholangiopancreatography with possible biliary sphincterotomy and possible biliary stent placement. 6.  Risks (up to and including bleeding, infection, perforation, pancreatitis that can be complicated by infected necrosis and death), benefits (removal of stones, alleviating blockage, decreasing risk of cholangitis or choledocholithiasis-related pancreatitis), and alternatives (watchful waiting, percutaneous transhepatic cholangiography) of ERCP were explained to patient/family in detail and patient elects to proceed.  Landry Dyke 10/10/2019, 12:57 PM

## 2019-10-10 NOTE — Op Note (Signed)
[facility] Patient Name: Breanna Noble Procedure Date : 10/10/2019 MRN: [id number] Attending MD: Arta Silence , MD Date of Birth: 09-May-1960 CSN: [id number] Age: 59 Admit Type: Outpatient Procedure:                Upper EUS Indications:              Suspected mass in pancreas on MRI, Obstruction of                            bile duct on MRI, Elevated liver enzymes Providers:                Arta Silence, MD, Benay Pillow, RN, Vista Lawman, RN, Cletis Athens, Technician, Rhae Lerner,                            CRNA Referring MD:             Dr. Yaakov Guthrie Medicines:                General Anesthesia Complications:            No immediate complications. Estimated Blood Loss:     Estimated blood loss was minimal. Procedure:                Pre-Anesthesia Assessment:                           - Prior to the procedure, a History and Physical                            was performed, and patient medications and                            allergies were reviewed. The patient's tolerance of                            previous anesthesia was also reviewed. The risks                            and benefits of the procedure and the sedation                            options and risks were discussed with the patient.                            All questions were answered, and informed consent                            was obtained. Prior Anticoagulants: The patient has                            taken no previous anticoagulant or antiplatelet  agents. ASA Grade Assessment: II - A patient with                            mild systemic disease. After reviewing the risks                            and benefits, the patient was deemed in                            satisfactory condition to undergo the procedure.                           After obtaining informed consent, the endoscope was                            passed under  direct vision. Throughout the                            procedure, the patient's blood pressure, pulse, and                            oxygen saturations were monitored continuously. The                            [id number] ([id number]) Olympus Linear EUS scope was                            introduced through the mouth, and advanced to the                            second part of duodenum. The upper EUS was                            accomplished without difficulty. The patient                            tolerated the procedure well. Scope In: Scope Out: Findings:      ENDOSONOGRAPHIC FINDING: :      An irregular mass was identified at junction of head/uncinate process of       the pancreas. The mass was hypoechoic. The mass measured approximately       56mm x 21 mm maximally in cross-sectional diameter. The endosonographic       borders were poorly-defined. An intact interface was seen between the       mass and the superior mesenteric artery, celiac trunk, portal vein and       superior mesenteric vein suggesting a lack of invasion. Fine needle       aspiration for cytology was performed. Color Doppler imaging was       utilized prior to needle puncture to confirm a lack of significant       vascular structures within the needle path. Four passes were made with       the 25 gauge needle using a transduodenal approach. A stylet was used. A       cytotechnologist was present to evaluate  the adequacy of the specimen.       Final cytology results are pending.      A few abnormal lymph nodes were visualized in the peripancreatic region.       The nodes were irregular, hypoechoic and had poorly defined margins.      There was dilation in the common bile duct which measured up to 14 mm.      Moderate hyperechoic material consistent with sludge was visualized       endosonographically in the gallbladder. Impression:               - A mass was identified at junction of                             head/uncinate process of the pancreas. This was                            staged T2 N1 Mx by endosonographic criteria. Fine                            needle aspiration performed.                           - A few abnormal lymph nodes were visualized in the                            peripancreatic region.                           - There was dilation in the common bile duct which                            measured up to 14 mm.                           - Hyperechoic material consistent with sludge was                            visualized endosonographically in the gallbladder. Moderate Sedation:      None Recommendation:           - Await cytology results.                           - Perform an ERCP today. Procedure Code(s):        --- Professional ---                           [phone], Esophagogastroduodenoscopy, flexible,                            transoral; with transendoscopic ultrasound-guided                            intramural or transmural fine needle                            aspiration/biopsy(s), (includes endoscopic  ultrasound examination limited to the esophagus,                            stomach or duodenum, and adjacent structures) Diagnosis Code(s):        --- Professional ---                           K86.89, Other specified diseases of pancreas                           I89.9, Noninfective disorder of lymphatic vessels                            and lymph nodes, unspecified                           K83.8, Other specified diseases of biliary tract                           K83.1, Obstruction of bile duct                           R74.8, Abnormal levels of other serum enzymes                           R93.3, Abnormal findings on diagnostic imaging of                            other parts of digestive tract CPT copyright 2019 American Medical Association. All rights reserved. The codes documented in this report are preliminary and  upon coder review may  be revised to meet current compliance requirements. Arta Silence, MD 10/10/2019 4:16:14 PM This report has been signed electronically. Number of Addenda: 0

## 2019-10-10 NOTE — Anesthesia Procedure Notes (Signed)
Procedure Name: Intubation Date/Time: 10/10/2019 1:20 PM Performed by: Kyung Rudd, CRNA Pre-anesthesia Checklist: Patient identified, Emergency Drugs available, Suction available and Patient being monitored Patient Re-evaluated:Patient Re-evaluated prior to induction Oxygen Delivery Method: Circle system utilized Preoxygenation: Pre-oxygenation with 100% oxygen Induction Type: IV induction Ventilation: Mask ventilation without difficulty and Oral airway inserted - appropriate to patient size Laryngoscope Size: Mac and 3 Grade View: Grade I Tube type: Oral Tube size: 7.0 mm Number of attempts: 1 Airway Equipment and Method: Stylet Placement Confirmation: ETT inserted through vocal cords under direct vision,  positive ETCO2 and breath sounds checked- equal and bilateral Secured at: 21 cm Tube secured with: Tape Dental Injury: Teeth and Oropharynx as per pre-operative assessment

## 2019-10-11 LAB — CYTOLOGY - NON PAP

## 2019-10-13 ENCOUNTER — Encounter (HOSPITAL_COMMUNITY): Payer: Self-pay | Admitting: Gastroenterology

## 2019-10-14 NOTE — Progress Notes (Signed)
[place]   [phone] [phone] Note   Patient Care Team: Vernie Shanks, MD as PCP - General (Family Medicine) Fontaine, Belinda Block, MD (Inactive) as Consulting Physician (Gynecology) Truitt Merle, MD as Consulting Physician (Oncology) Jonnie Finner, RN as Registered Nurse Arta Silence, MD as Consulting Physician (Gastroenterology)  Date of Service:  10/16/2019   CHIEF COMPLAINTS/PURPOSE OF CONSULTATION:  Newly Diagnosed Pancreatic Cancer   REFERRING PHYSICIAN:  Dr Paulita Fujita  Oncology History Overview Note  Cancer Staging Pancreatic cancer [facility]) Staging form: Exocrine Pancreas, AJCC 8th Edition - Clinical stage from 10/16/2019: Stage IIB (cT2, cN1, cM0) - Signed by Truitt Merle, MD on 10/16/2019    Pancreatic cancer ([place])  10/03/2019 Imaging   US Abdomen 10/03/19  IMPRESSION: Markedly dilated bile ducts. Dilated gallbladder with sludge but no gallstones.   2.9 cm mass in the uncinate process most likely pancreatic neoplasm causing biliary obstruction. Recommend MRI of the pancreas without with contrast for further evaluation. If the patient cannot have MRI, CT of the pancreas with contrast recommended.   10/07/2019 Imaging   MRI Abdomen 10/07/19  IMPRESSION: 1. There is a hypoenhancing mass of the pancreatic uncinate with abrupt obstruction of the central common bile duct, measuring approximately 2.9 cm, consistent with pancreatic adenocarcinoma. 2. Gross intra and extrahepatic biliary ductal dilatation, common bile duct measuring 1.4 cm. Gallbladder hydrops. 3. The pancreatic duct is nondilated. 4. Pancreatic mass lies closely adjacent to the most central portions of the superior mesenteric vein and central portal vein, due to motion artifact it is difficult to clearly discern whether there is a preserved fat plane. Multiphasic contrast enhanced pancreatic protocol CT may be less motion sensitive and  better delineate vascular structures for the purposes of surgical planning if necessary. 5. No evidence of lymphadenopathy or metastatic disease in the abdomen.   10/10/2019 Procedure   EUS by Dr Paulita Fujita 10/10/19  IMPRESSION - A mass was identified at junction of head/uncinate process of the pancreas. This was staged T2 N1 Mx by endosonographic criteria. Fine needle aspiration performed. - A few abnormal lymph nodes were visualized in the peripancreatic region. - There was dilation in the common bile duct which measured up to 14 mm. - Hyperechoic material consistent with sludge was visualized endosonographically in the gallbladder.   10/10/2019 Procedure   ERCP by Dr Paulita Fujita 10/10/19  IMPRESSION - The major papilla appeared normal. - One temporary stent was placed into the ventral pancreatic duct. - A biliary sphincterotomy was performed. - One covered metal stent was placed into the common bile duct.   10/10/2019 Initial Biopsy   FINAL MICROSCOPIC DIAGNOSIS: 10/10/19  A. PANCREAS, HEAD, FINE NEEDLE ASPIRATION:  - Malignant cells present, consistent with adenocarcinoma    10/16/2019 Initial Diagnosis   Pancreatic cancer ([place])   10/16/2019 Cancer Staging   Staging form: Exocrine Pancreas, AJCC 8th Edition - Clinical stage from 10/16/2019: Stage IIB (cT2, cN1, cM0) - Signed by Truitt Merle, MD on 10/16/2019      HISTORY OF PRESENTING ILLNESS:  Breanna Noble 59 y.o. female is a here because of newly diagnosed pancreatic cancer. The patient was referred by Dr Paulita Fujita. The patient presents to the clinic today accompanied by her husband.  She was having jaundice of urine and eyes since 1 week before Memorial day (for 3 weeks). She notes having skin itching and abdominal pain. She had mild nausea 1 week before her jaundice started but resolved after her EUS.  She notes her eating was poor around EUS but much improved now. She is not sure how much weight was lost, but not only mild looseness in her  clothes. She notes since her EUS she has black stool with bright red blood on the outside. She denies labs since her procedure.   Today she notes her jaundice has improved but still has itching. She takes benadryl at night which only gives 2 hours of relief. She denies headaches or vision change. She does note pain with deep breath in lower chest. She denies fever, cough or night sweats or LE edema. She notes her energy is increasing. She is able to do all her routine activities but slowly. She is mostly sitting or laying down more than 50% of the day. She was very active prior to diagnosis. She notes since her husband's prior heart issues they have not been eating meat overall. She is willing to add some meat in her diet and wants to keep sugar intake low. She is open to seeing dietician.   Socially she is married and lives in [place]. She has 2 adult children. She is retired and her husband works in [employer]. She never smoked, used alcohol or recreational drugs.  She has a PMHx of hypothyroidism, on synthroid. She is s/p tonsillectomy and eye surgeries. Her mother passed from benign brian tumor. Her maternal uncle had colon cancer and MGM had pancreatic cancer. Her MGF had lung cancer from smoking.    REVIEW OF SYSTEMS:    Constitutional: Denies fevers, chills or abnormal night sweats Eyes: Denies blurriness of vision, double vision or watery eyes Ears, nose, mouth, throat, and face: Denies mucositis or sore throat Respiratory: Denies cough, dyspnea or wheezes Cardiovascular: Denies palpitation, chest discomfort or lower extremity swelling Gastrointestinal:  Denies nausea, heartburn or change in bowel habits (+) Hematochezia with dark stool and red blood  Skin: Denies abnormal skin rashes (+) Jaundice of eyes and skin with itching  Lymphatics: Denies new lymphadenopathy or easy bruising Neurological:Denies numbness, tingling or new weaknesses Behavioral/Psych: Mood is stable, no new  changes  All other systems were reviewed with the patient and are negative.   MEDICAL HISTORY:  Past Medical History:  Diagnosis Date  . Cataracts, bilateral   . Diverticulosis 12/07/2001   found on colonoscopy  . Hypothyroid 10/2007  . Macular hole   . Pancreatic mass   . PONV (postoperative nausea and vomiting)     SURGICAL HISTORY: Past Surgical History:  Procedure Laterality Date  . BILIARY STENT PLACEMENT  10/10/2019   Procedure: BILIARY STENT PLACEMENT;  Surgeon: Arta Silence, MD;  Location: [place];  Service: Endoscopy;;  . CATARACT EXTRACTION, BILATERAL    . COLONOSCOPY    . ERCP N/A 10/10/2019   Procedure: ENDOSCOPIC RETROGRADE CHOLANGIOPANCREATOGRAPHY (ERCP);  Surgeon: Arta Silence, MD;  Location[facility] ENDOSCOPY;  Service: Endoscopy;  Laterality: N/A;  . ESOPHAGOGASTRODUODENOSCOPY N/A 10/10/2019   Procedure: ESOPHAGOGASTRODUODENOSCOPY (EGD);  Surgeon: Arta Silence, MD;  Location: [facility] ENDOSCOPY;  Service: Endoscopy;  Laterality: N/A;  . EUS N/A 10/10/2019   Procedure: UPPER ENDOSCOPIC ULTRASOUND (EUS) LINEAR;  Surgeon: Arta Silence, MD;  Location: [place];  Service: Endoscopy;  Laterality: N/A;  . FINE NEEDLE ASPIRATION  10/10/2019   Procedure: FINE NEEDLE ASPIRATION (FNA) LINEAR;  Surgeon: Arta Silence, MD;  Location: [place] ENDOSCOPY;  Service: Endoscopy;;  . Macular hole surg    . PANCREATIC STENT PLACEMENT  10/10/2019   Procedure: PANCREATIC STENT PLACEMENT;  Surgeon: Arta Silence, MD;  Location: [place] ENDOSCOPY;  Service: Endoscopy;;  . SPHINCTEROTOMY  10/10/2019   Procedure: SPHINCTEROTOMY;  Surgeon: Arta Silence, MD;  Location: [facility] ENDOSCOPY;  Service: Endoscopy;;  . TONSILLECTOMY      SOCIAL HISTORY: Social History   Socioeconomic History  . Marital status: Married    Spouse name: Not on file  . Number of children: 2  . Years of education: Not on file  . Highest education level: Not on file  Occupational History  . Not on file  Tobacco  Use  . Smoking status: Never Smoker  . Smokeless tobacco: Never Used  Vaping Use  . Vaping Use: Never used  Substance and Sexual Activity  . Alcohol use: No    Alcohol/week: 0.0 standard drinks  . Drug use: No  . Sexual activity: Yes    Birth control/protection: Post-menopausal    Comment: 1st intercourse 72 yo-2 partners  Other Topics Concern  . Not on file  Social History Narrative  . Not on file   Social Determinants of Health   Financial Resource Strain:   . Difficulty of Paying Living Expenses:   Food Insecurity:   . Worried About Charity fundraiser in the Last Year:   . Arboriculturist in the Last Year:   Transportation Needs:   . Film/video editor (Medical):   Marland Kitchen Lack of Transportation (Non-Medical):   Physical Activity:   . Days of Exercise per Week:   . Minutes of Exercise per Session:   Stress:   . Feeling of Stress :   Social Connections:   . Frequency of Communication with Friends and Family:   . Frequency of Social Gatherings with Friends and Family:   . Attends Religious Services:   . Active Member of Clubs or Organizations:   . Attends Archivist Meetings:   Marland Kitchen Marital Status:   Intimate Partner Violence:   . Fear of Current or Ex-Partner:   . Emotionally Abused:   Marland Kitchen Physically Abused:   . Sexually Abused:     FAMILY HISTORY: Family History  Problem Relation Age of Onset  . Hypertension Father   . Heart disease Father   . Cancer Maternal Grandmother        PANCREATIC  . Heart disease Maternal Grandfather   . Cancer Maternal Grandfather        lung cancer  . Diabetes Paternal Grandfather   . Cancer Maternal Uncle        Colon  . Other Mother        Brain tumor benign  . Breast cancer Neg Hx     ALLERGIES:  has No Known Allergies.  MEDICATIONS:  Current Outpatient Medications  Medication Sig Dispense Refill  . Ascorbic Acid (VITAMIN C PO) Take 1,000 mg by mouth daily.     . B Complex Vitamins (B-COMPLEX/B-12 PO) Take by  mouth.    . Calcium Carb-Cholecalciferol (CALCIUM 1000 + D PO) Take by mouth.    . diphenhydrAMINE (BENADRYL) 25 MG tablet Take 25 mg by mouth at bedtime.    . ferrous sulfate 325 (65 FE) MG tablet Take 325 mg by mouth daily with breakfast.    . hydrOXYzine (ATARAX/VISTARIL) 25 MG tablet Take 1 tablet (25 mg total) by mouth 3 (three) times daily as needed for itching. 60 tablet 1  . levothyroxine (SYNTHROID, LEVOTHROID) 50 MCG tablet Take 1 tablet (50 mcg total) by mouth daily. 90 tablet 4  . Multiple Vitamin (MULTIVITAMIN PO) Take by mouth.      Marland Kitchen  mupirocin ointment (BACTROBAN) 2 % Place 1 application into the nose daily as needed (sore in nose).     No current facility-administered medications for this visit.    PHYSICAL EXAMINATION: ECOG PERFORMANCE STATUS: 2 - Symptomatic, <50% confined to bed  Vitals:   10/16/19 1102  BP: 133/68  Pulse: 95  Resp: 18  Temp: 98.1 F (36.7 C)  SpO2: 100%   Filed Weights   10/16/19 1102  Weight: 131 lb 1.6 oz (59.5 kg)    GENERAL:alert, no distress and comfortable SKIN: skin color, texture, turgor are normal, no rashes or significant lesions (+) Jaundice EYES: normal, Conjunctiva are pink and non-injected (+) Jaundice of eyes   NECK: supple, thyroid normal size, non-tender, without nodularity LYMPH:  no palpable lymphadenopathy in the cervical, axillary  LUNGS: clear to auscultation and percussion with normal breathing effort HEART: regular rate and no lower extremity edema (+) Mild heart murmur  ABDOMEN:abdomen soft, non-tender and normal bowel sounds No hepatomegaly (+) Right abdominal tenderness  Musculoskeletal:no cyanosis of digits and no clubbing  NEURO: alert & oriented x 3 with fluent speech, no focal motor/sensory deficits  LABORATORY DATA:  I have reviewed the data as listed CBC Latest Ref Rng & Units 10/16/2019 12/10/2012 11/09/2011  WBC 4.0 - 10.5 K/uL 10.6(H) 7.0 6.4  Hemoglobin 12.0 - 15.0 g/dL 11.8(L) 14.0 13.2  Hematocrit 36  - 46 % 35.5(L) 41.1 39.1  Platelets 150 - 400 K/uL 439(H) 278 251    CMP Latest Ref Rng & Units 10/16/2019 12/10/2012  Glucose 70 - 99 mg/dL 118(H) 89  BUN 6 - 20 mg/dL 7 6  Creatinine 0.44 - 1.00 mg/dL 0.67 0.66  Sodium 135 - 145 mmol/L 137 139  Potassium 3.5 - 5.1 mmol/L 3.7 4.4  Chloride 98 - 111 mmol/L 100 99  CO2 22 - 32 mmol/L 28 25  Calcium 8.9 - 10.3 mg/dL 9.1 9.3  Total Protein 6.5 - 8.1 g/dL 6.4(L) 6.9  Total Bilirubin 0.3 - 1.2 mg/dL 8.8(HH) 0.4  Alkaline Phos 38 - 126 U/L 247(H) 41  AST 15 - 41 U/L 80(H) 19  ALT 0 - 44 U/L 73(H) 18     RADIOGRAPHIC STUDIES: I have personally reviewed the radiological images as listed and agreed with the findings in the report. MR ABDOMEN WWO CONTRAST  Result Date: 10/07/2019 CLINICAL DATA:  Pancreatic mass, jaundice EXAM: MRI ABDOMEN WITHOUT AND WITH CONTRAST TECHNIQUE: Multiplanar multisequence MR imaging of the abdomen was performed both before and after the administration of intravenous contrast. CONTRAST:  6.69mL GADAVIST GADOBUTROL 1 MMOL/ML IV SOLN COMPARISON:  Abdominal ultrasound, 10/03/2019 FINDINGS: Lower chest: No acute findings. Hepatobiliary: No hepatic mass or other parenchymal abnormality identified. Gross intra and extrahepatic biliary ductal dilatation, common bile duct measuring 1.4 cm. Gallbladder hydrops. Pancreas: There is a hypoenhancing mass of the pancreatic uncinate with abrupt obstruction of the central common bile duct, mass measuring approximately 2.9 x 1.8 x 2.7 cm (series 16, image 41). The pancreatic duct is nondilated. Spleen:  Within normal limits in size and appearance. Adrenals/Urinary Tract: No masses identified. No evidence of hydronephrosis. Stomach/Bowel: Visualized portions within the abdomen are unremarkable. Vascular/Lymphatic: No pathologically enlarged lymph nodes identified. No abdominal aortic aneurysm demonstrated. Pancreatic mass lies closely adjacent to the most central portions of the superior  mesenteric vein and central portal vein, due to motion artifact it is difficult to clearly discern whether there is a preserved fat plane (e.g. series 18, image 39). Other:  None. Musculoskeletal: No suspicious bone  lesions identified. IMPRESSION: 1. There is a hypoenhancing mass of the pancreatic uncinate with abrupt obstruction of the central common bile duct, measuring approximately 2.9 cm, consistent with pancreatic adenocarcinoma. 2. Gross intra and extrahepatic biliary ductal dilatation, common bile duct measuring 1.4 cm. Gallbladder hydrops. 3. The pancreatic duct is nondilated. 4. Pancreatic mass lies closely adjacent to the most central portions of the superior mesenteric vein and central portal vein, due to motion artifact it is difficult to clearly discern whether there is a preserved fat plane. Multiphasic contrast enhanced pancreatic protocol CT may be less motion sensitive and better delineate vascular structures for the purposes of surgical planning if necessary. 5. No evidence of lymphadenopathy or metastatic disease in the abdomen. These results will be called to the ordering clinician or representative by the Radiologist Assistant, and communication documented in the PACS or [employer]. Electronically Signed   By: Eddie Candle M.D.   On: 10/07/2019 16:09   US Abdomen Complete  Result Date: 10/03/2019 CLINICAL DATA:  Jaundice EXAM: ABDOMEN ULTRASOUND COMPLETE COMPARISON:  None. FINDINGS: Gallbladder: Distended gallbladder containing sludge. No gallstones or gallbladder wall thickening. Negative sonographic Murphy sign. Common bile duct: Diameter: Markedly dilated at 17 mm. Diffuse intrahepatic biliary dilatation also present. Liver: No focal liver mass lesion. Normal hepatic echogenicity. Portal vein is patent on color Doppler imaging with normal direction of blood flow towards the liver. IVC: No abnormality visualized. Pancreas: Hypoechoic mass uncinate process measuring 2.9 cm. Probable  neoplasm causing obstruction of the bile ducts. Spleen: Size and appearance within normal limits. Right Kidney: Length: 9.8 cm. Echogenicity within normal limits. No mass or hydronephrosis visualized. Left Kidney: Length: 10.8 cm. Echogenicity within normal limits. No mass or hydronephrosis visualized. Abdominal aorta: No aneurysm visualized. Other findings: None. IMPRESSION: Markedly dilated bile ducts. Dilated gallbladder with sludge but no gallstones. 2.9 cm mass in the uncinate process most likely pancreatic neoplasm causing biliary obstruction. Recommend MRI of the pancreas without with contrast for further evaluation. If the patient cannot have MRI, CT of the pancreas with contrast recommended. Electronically Signed   By: Franchot Gallo M.D.   On: 10/03/2019 16:55   DG ERCP BILIARY & PANCREATIC DUCTS  Result Date: 10/10/2019 CLINICAL DATA:  ERCP for obstructive jaundice. EXAM: ERCP TECHNIQUE: Multiple spot images obtained with the fluoroscopic device and submitted for interpretation post-procedure. FLUOROSCOPY TIME:  Fluoroscopy Time:  11 minutes and 6 seconds Radiation Exposure Index (if provided by the fluoroscopic device): 117.78 mGy Number of Acquired Spot Images: 6 COMPARISON:  MRI dated October 07, 2019 FINDINGS: There has been cannulation of the common bile duct and pancreatic duct. There was placement of a plastic pancreatic stent. There is placement of a metallic common bile duct stent. There is narrowing of the distal common bile duct corresponding to the finding seen on the patient's recent MRI. There is intrahepatic biliary ductal dilatation. IMPRESSION: Status post ERCP with stent placement as detailed above. These images were submitted for radiologic interpretation only. Please see the procedural report for the amount of contrast and the fluoroscopy time utilized. Electronically Signed   By: Constance Holster M.D.   On: 10/10/2019 19:11    ASSESSMENT & PLAN:  Breanna Noble is a 59 y.o.  caucasian female with a history of Cataracts, Diverticulosis, Hypothyroidism  1. Pancreatic Cancer, Stage IIB, T2N0 -I discussed her image findings and pathology report with patient and her husband in great detail. Her US showed bile duct and gallbladder dilation due to 2-3cm mass at  head of pancreas/Uncinate. Her MRI showed her mass is laying close to SMV but not involving it. There is no evidence of lymphadenopathy or metastatic disease in the abdomen on MRI. -She underwent EUS and ERCP on 10/10/19 and had 2 stents placed by Dr Paulita Fujita. Her pancreatic mass biopsy showed Adenocarcinoma of Pancreas. Endoscopic staging T2N1, no evidence of vascular involvement. -I recommend CT pelvis and CT chest to complete staging to evaluate for distant metastasis.  -I discussed pancreatic cancer is aggressive disease that has very high risk of recurrence. Based on her current work up her tumor is likely resectable. I discussed standard Whipple surgery is curative. I discussed this is a large surgery.  Due to the tumor location and positive nodes, I recommend neoadjuvant chemotherapy to shrink her tumor and reduce her high risk of recurrence. She plans to consult with Dr Sofie Rower on 6/22 about surgical plan. Given her age and good health I suspect she will tolerate treatment well.  -I recommend standard neoadjuvant chemo with FOLFIRINOX every 2 weeks for 8 cycles over 4 months. Will monitor her response with scan after 3 months chemo. If she responds well enough she will proceed with surgery.   --Chemotherapy consent: Side effects including but does not limited to, fatigue, nausea, vomiting, diarrhea, hair loss, neuropathy, fluid retention, renal and kidney dysfunction, neutropenic fever, needed for blood transfusion, bleeding, were discussed with patient in great detail. She agrees to proceed. -The goal of therapy is curative -Based on surgical pathology she will proceed with cancer surveillance after surgery. If she has  residual disease or her cancer is found unresectable, more chemo would be recommended to control her disease. -She will proceed with chemo education class before start of chemo. I discussed the option of PAC placement, she agrees -I will obtain new baseline labs today, she is still jaundiced. Will plan to start chemo in 2-3weeks to give time to recover.  -F/u in 2 weeks    2. Obstructive Jaundice  -She had 3 weeks of Jaundice of eyes, skin and urine. She underwent ERCP with Dr Paulita Fujita for CBD and temporary ventral pancreatic duct stents placement on 10/10/19.   -Her Jaundice has improved and has skin itching. I will call in hydroxyzine today (10/16/19) -I will obtain LFTs today (10/16/19).    3. Upper Abdominal Pain, Mild weight loss  -Presented with initial jaundice but since stent placement her pain has much improved. She does note pain with deep breath in lower chest which she related to tube use during her procedure. She notes she does not need pain medication at this time.  -Due to initial poor eating she had mild weight loss in the past few weeks. She is eating much better now.  -I encouraged her to eat high calorie, high protein diet with well cooked food. She is fine to reduce simple sugars and red meat in diet. I will send Dietician referral, she is agreeable.   4. Genetic Testing -She has maternal family history of pancreatic and colon cancer. I discussed she is eligible of genetic testing. She is interested, I will send referral.    PLAN:  -I called in hydroxyzine today  -CT abd/Pelvis with pancreatic protocol and Chest in 1-2 weeks  -Send genetic referral  -Send dietician referral.  -Chemo education class in 2 week  -Lab and f/u in 2 weeks  -Copy note to Dr Paulita Fujita and her PCP. Her TBIL is 8.8, I sent a message to Dr. Paulita Fujita to see if she needs  to be evaluated for her stent position. Will also ask Dr. Barry Dienes to repeat her lab on her visit with her next week 6/22    Orders Placed  This Encounter  Procedures  . CT Chest W Contrast    Pancreatic CT protocol    Standing Status:   Future    Standing Expiration Date:   10/15/2020    Order Specific Question:   If indicated for the ordered procedure, I authorize the administration of contrast media per Radiology protocol    Answer:   Yes    Order Specific Question:   Is patient pregnant?    Answer:   No    Order Specific Question:   Preferred imaging location?    Answer:   [facility]    Order Specific Question:   Release to patient    Answer:   Immediate    Order Specific Question:   Radiology Contrast Protocol - do NOT remove file path    Answer:   \\[email or web address]  . CT Abdomen Pelvis W Contrast    CT pancreatic protocol    Standing Status:   Future    Standing Expiration Date:   10/15/2020    Order Specific Question:   If indicated for the ordered procedure, I authorize the administration of contrast media per Radiology protocol    Answer:   Yes    Order Specific Question:   Is patient pregnant?    Answer:   No    Order Specific Question:   Preferred imaging location?    Answer:   [facility]    Order Specific Question:   Release to patient    Answer:   Immediate    Order Specific Question:   Is Oral Contrast requested for this exam?    Answer:   Yes, Per Radiology protocol    Order Specific Question:   Radiology Contrast Protocol - do NOT remove file path    Answer:   \\[email or web address]  . CBC with Differential ([place] Only)    Standing Status:   Standing    Number of Occurrences:   100    Standing Expiration Date:   10/15/2020  . CMP ([place] only)    Standing Status:   Standing    Number of Occurrences:   100    Standing Expiration Date:   10/15/2020  . Ferritin    Standing Status:   Future    Number of Occurrences:   1    Standing Expiration Date:   10/15/2020  . CA 19.9    Standing Status:   Standing    Number of  Occurrences:   100    Standing Expiration Date:   10/15/2020  . Ambulatory referral to Genetics    Referral Priority:   Routine    Referral Type:   Consultation    Referral Reason:   Specialty Services Required    Number of Visits Requested:   1    All questions were answered. The patient knows to call the clinic with any problems, questions or concerns. The total time spent in the appointment was 60 minutes.     Truitt Merle, MD 10/16/2019   I, Joslyn Devon, am acting as scribe for Truitt Merle, MD.   I have reviewed the above documentation for accuracy and completeness, and I agree with the above.

## 2019-10-14 NOTE — Progress Notes (Signed)
Spoke with patient regarding referral we received from [facility] for new diagnosis of pancreatic cancer.  I explained my role as nurse navigator.  She was given an appointment with Dr. Burr Medico for this Wednesday 6/16 at 11 am to arrive by 10:45.  She was made aware of our location and that [place] parking is available.  She knows that her husband can come with her.  She verbalized an understanding and was very appreciative of the soon appointment.

## 2019-10-16 ENCOUNTER — Encounter: Payer: Self-pay | Admitting: Hematology

## 2019-10-16 ENCOUNTER — Other Ambulatory Visit: Payer: Self-pay

## 2019-10-16 ENCOUNTER — Inpatient Hospital Stay: Payer: BC Managed Care – PPO | Attending: Hematology | Admitting: Hematology

## 2019-10-16 ENCOUNTER — Inpatient Hospital Stay: Payer: BC Managed Care – PPO

## 2019-10-16 ENCOUNTER — Encounter: Payer: Self-pay | Admitting: *Deleted

## 2019-10-16 DIAGNOSIS — L299 Pruritus, unspecified: Secondary | ICD-10-CM | POA: Diagnosis not present

## 2019-10-16 DIAGNOSIS — R1010 Upper abdominal pain, unspecified: Secondary | ICD-10-CM | POA: Insufficient documentation

## 2019-10-16 DIAGNOSIS — Z801 Family history of malignant neoplasm of trachea, bronchus and lung: Secondary | ICD-10-CM | POA: Diagnosis not present

## 2019-10-16 DIAGNOSIS — Z780 Asymptomatic menopausal state: Secondary | ICD-10-CM | POA: Insufficient documentation

## 2019-10-16 DIAGNOSIS — C250 Malignant neoplasm of head of pancreas: Secondary | ICD-10-CM

## 2019-10-16 DIAGNOSIS — C257 Malignant neoplasm of other parts of pancreas: Secondary | ICD-10-CM | POA: Diagnosis not present

## 2019-10-16 DIAGNOSIS — K831 Obstruction of bile duct: Secondary | ICD-10-CM | POA: Insufficient documentation

## 2019-10-16 DIAGNOSIS — Z800 Family history of malignant neoplasm of digestive organs: Secondary | ICD-10-CM

## 2019-10-16 DIAGNOSIS — E039 Hypothyroidism, unspecified: Secondary | ICD-10-CM | POA: Diagnosis not present

## 2019-10-16 DIAGNOSIS — Z79899 Other long term (current) drug therapy: Secondary | ICD-10-CM | POA: Insufficient documentation

## 2019-10-16 DIAGNOSIS — C259 Malignant neoplasm of pancreas, unspecified: Secondary | ICD-10-CM | POA: Insufficient documentation

## 2019-10-16 LAB — CMP (CANCER CENTER ONLY)
ALT: 73 U/L — ABNORMAL HIGH (ref 0–44)
AST: 80 U/L — ABNORMAL HIGH (ref 15–41)
Albumin: 3 g/dL — ABNORMAL LOW (ref 3.5–5.0)
Alkaline Phosphatase: 247 U/L — ABNORMAL HIGH (ref 38–126)
Anion gap: 9 (ref 5–15)
BUN: 7 mg/dL (ref 6–20)
CO2: 28 mmol/L (ref 22–32)
Calcium: 9.1 mg/dL (ref 8.9–10.3)
Chloride: 100 mmol/L (ref 98–111)
Creatinine: 0.67 mg/dL (ref 0.44–1.00)
GFR, Est AFR Am: >60 mL/min (ref >60)
GFR, Estimated: >60 mL/min (ref >60)
Glucose, Bld: 118 mg/dL — ABNORMAL HIGH (ref 70–99)
Potassium: 3.7 mmol/L (ref 3.5–5.1)
Sodium: 137 mmol/L (ref 135–145)
Total Bilirubin: 8.8 mg/dL (ref 0.3–1.2)
Total Protein: 6.4 g/dL — ABNORMAL LOW (ref 6.5–8.1)

## 2019-10-16 LAB — CBC WITH DIFFERENTIAL (CANCER CENTER ONLY)
Abs Immature Granulocytes: 0.27 10*3/uL — ABNORMAL HIGH (ref 0.00–0.07)
Basophils Absolute: 0.1 10*3/uL (ref 0.0–0.1)
Basophils Relative: 1 %
Eosinophils Absolute: 0.2 10*3/uL (ref 0.0–0.5)
Eosinophils Relative: 2 %
HCT: 35.5 % — ABNORMAL LOW (ref 36.0–46.0)
Hemoglobin: 11.8 g/dL — ABNORMAL LOW (ref 12.0–15.0)
Immature Granulocytes: 3 %
Lymphocytes Relative: 36 %
Lymphs Abs: 3.8 10*3/uL (ref 0.7–4.0)
MCH: 29.2 pg (ref 26.0–34.0)
MCHC: 33.2 g/dL (ref 30.0–36.0)
MCV: 87.9 fL (ref 80.0–100.0)
Monocytes Absolute: 1 10*3/uL (ref 0.1–1.0)
Monocytes Relative: 10 %
Neutro Abs: 5.2 10*3/uL (ref 1.7–7.7)
Neutrophils Relative %: 48 %
Platelet Count: 439 10*3/uL — ABNORMAL HIGH (ref 150–400)
RBC: 4.04 MIL/uL (ref 3.87–5.11)
RDW: 17.2 % — ABNORMAL HIGH (ref 11.5–15.5)
WBC Count: 10.6 10*3/uL — ABNORMAL HIGH (ref 4.0–10.5)
nRBC: 0 % (ref 0.0–0.2)

## 2019-10-16 LAB — FERRITIN: Ferritin: 399 ng/mL — ABNORMAL HIGH (ref 11–307)

## 2019-10-16 MED ORDER — HYDROXYZINE HCL 25 MG PO TABS: 25.0000 mg | ORAL_TABLET | Freq: Three times a day (TID) | ORAL | 1 refills | Status: DC | PRN

## 2019-10-16 NOTE — Progress Notes (Signed)
CRITICAL VALUE ALERT  Critical Value:  Total Bili 8.8  Date & Time Notied:  10/16/19 at 1330  Provider Notified: Tollie Pizza, RN with Dr. Burr Medico  Orders Received/Actions taken: RN verbalized understanding and will notify MD for further evaluation.

## 2019-10-16 NOTE — Progress Notes (Signed)
Critical Value: T. Bili 8.8 Dr. Burr Medico notified

## 2019-10-16 NOTE — Progress Notes (Signed)
Met with patient and her husband Breanna Noble today at initial medical oncology appointment with Dr. Burr Medico.  I explained my role as nurse navigator and they were both given my card with my direct contact information.  She was given handouts of nurse navigation, our [facility] and a book on Pancreatic Cancer (by [facility]).  She also was given a business card for social work.    She has an upcoming appointment with Dr. Barry Dienes on 6/21.  I escorted them to the lab for added on lab work today.  They verbalized an understanding of they will be contacted with appointments for CT chest and pelvis as well as starting neoadjuvant chemotherapy.

## 2019-10-17 ENCOUNTER — Telehealth: Payer: Self-pay | Admitting: Hematology

## 2019-10-17 ENCOUNTER — Other Ambulatory Visit: Payer: Self-pay

## 2019-10-17 DIAGNOSIS — C250 Malignant neoplasm of head of pancreas: Secondary | ICD-10-CM

## 2019-10-17 LAB — CANCER ANTIGEN 19-9: CA 19-9: 883 U/mL — ABNORMAL HIGH (ref 0–35)

## 2019-10-17 NOTE — Addendum Note (Signed)
Addended by: Truitt Merle on: 10/17/2019 05:15 PM   Modules accepted: Orders

## 2019-10-17 NOTE — Telephone Encounter (Signed)
Called to inform pt that I added a lab and genetics appt.  Spoke with pt and she is aware of the appt date and time.

## 2019-10-17 NOTE — Telephone Encounter (Signed)
Scheduled appt per 6/16 los.  Spoke with pt and she is aware of her appt date and time.

## 2019-10-21 ENCOUNTER — Telehealth: Payer: Self-pay | Admitting: Emergency Medicine

## 2019-10-21 NOTE — Telephone Encounter (Addendum)
Lab results given to patient. Lab appt made for 6/22 @ 11am before pt sees Dr.Byerly d/t pt not seeing Dr.Byerly until late afternoon on 6/22 and labs need to be completed before scan on 6/23. Pt verbalized understanding of this.   ----- Message from Truitt Merle, MD sent at 10/20/2019  7:03 AM EDT ----- Please let pt know her lab results, tbil is still very high. She is scheduled to see Dr. Barry Dienes on 6/22 and she may repeat lab. If not, please schedule her lab before her CT scan on 6/23. Please fax her lab result to Dr. Paulita Fujita. If tbil does not come down next week, she needs to see Dr.Outlaw urgently.  Thanks  Truitt Merle  10/20/2019

## 2019-10-22 ENCOUNTER — Inpatient Hospital Stay: Payer: BC Managed Care – PPO

## 2019-10-22 ENCOUNTER — Other Ambulatory Visit: Payer: Self-pay

## 2019-10-22 ENCOUNTER — Telehealth: Payer: Self-pay | Admitting: Emergency Medicine

## 2019-10-22 DIAGNOSIS — E039 Hypothyroidism, unspecified: Secondary | ICD-10-CM | POA: Diagnosis not present

## 2019-10-22 DIAGNOSIS — K831 Obstruction of bile duct: Secondary | ICD-10-CM | POA: Diagnosis not present

## 2019-10-22 DIAGNOSIS — C250 Malignant neoplasm of head of pancreas: Secondary | ICD-10-CM | POA: Diagnosis not present

## 2019-10-22 DIAGNOSIS — Z780 Asymptomatic menopausal state: Secondary | ICD-10-CM | POA: Diagnosis not present

## 2019-10-22 DIAGNOSIS — Z79899 Other long term (current) drug therapy: Secondary | ICD-10-CM | POA: Diagnosis not present

## 2019-10-22 DIAGNOSIS — Z800 Family history of malignant neoplasm of digestive organs: Secondary | ICD-10-CM | POA: Diagnosis not present

## 2019-10-22 DIAGNOSIS — C257 Malignant neoplasm of other parts of pancreas: Secondary | ICD-10-CM | POA: Diagnosis not present

## 2019-10-22 DIAGNOSIS — Z801 Family history of malignant neoplasm of trachea, bronchus and lung: Secondary | ICD-10-CM | POA: Diagnosis not present

## 2019-10-22 DIAGNOSIS — L299 Pruritus, unspecified: Secondary | ICD-10-CM | POA: Diagnosis not present

## 2019-10-22 DIAGNOSIS — R1010 Upper abdominal pain, unspecified: Secondary | ICD-10-CM | POA: Diagnosis not present

## 2019-10-22 LAB — CMP (CANCER CENTER ONLY)
ALT: 72 U/L — ABNORMAL HIGH (ref 0–44)
AST: 72 U/L — ABNORMAL HIGH (ref 15–41)
Albumin: 2.8 g/dL — ABNORMAL LOW (ref 3.5–5.0)
Alkaline Phosphatase: 219 U/L — ABNORMAL HIGH (ref 38–126)
Anion gap: 8 (ref 5–15)
BUN: 7 mg/dL (ref 6–20)
CO2: 29 mmol/L (ref 22–32)
Calcium: 8.7 mg/dL — ABNORMAL LOW (ref 8.9–10.3)
Chloride: 103 mmol/L (ref 98–111)
Creatinine: 0.68 mg/dL (ref 0.44–1.00)
GFR, Est AFR Am: >60 mL/min (ref >60)
GFR, Estimated: >60 mL/min (ref >60)
Glucose, Bld: 108 mg/dL — ABNORMAL HIGH (ref 70–99)
Potassium: 3.5 mmol/L (ref 3.5–5.1)
Sodium: 140 mmol/L (ref 135–145)
Total Bilirubin: 5.3 mg/dL (ref 0.3–1.2)
Total Protein: 6 g/dL — ABNORMAL LOW (ref 6.5–8.1)

## 2019-10-22 LAB — CBC WITH DIFFERENTIAL (CANCER CENTER ONLY)
Abs Immature Granulocytes: 0.03 10*3/uL (ref 0.00–0.07)
Basophils Absolute: 0 10*3/uL (ref 0.0–0.1)
Basophils Relative: 1 %
Eosinophils Absolute: 0.1 10*3/uL (ref 0.0–0.5)
Eosinophils Relative: 2 %
HCT: 32.9 % — ABNORMAL LOW (ref 36.0–46.0)
Hemoglobin: 10.6 g/dL — ABNORMAL LOW (ref 12.0–15.0)
Immature Granulocytes: 1 %
Lymphocytes Relative: 37 %
Lymphs Abs: 2.4 10*3/uL (ref 0.7–4.0)
MCH: 29.7 pg (ref 26.0–34.0)
MCHC: 32.2 g/dL (ref 30.0–36.0)
MCV: 92.2 fL (ref 80.0–100.0)
Monocytes Absolute: 0.8 10*3/uL (ref 0.1–1.0)
Monocytes Relative: 12 %
Neutro Abs: 3.2 10*3/uL (ref 1.7–7.7)
Neutrophils Relative %: 47 %
Platelet Count: 358 10*3/uL (ref 150–400)
RBC: 3.57 MIL/uL — ABNORMAL LOW (ref 3.87–5.11)
RDW: 20.4 % — ABNORMAL HIGH (ref 11.5–15.5)
WBC Count: 6.5 10*3/uL (ref 4.0–10.5)
nRBC: 0 % (ref 0.0–0.2)

## 2019-10-22 NOTE — Telephone Encounter (Signed)
Total billi 5.6 today. Lab results faxed to Dr. Paulita Fujita and staff message sent to Dr.Feng.

## 2019-10-23 ENCOUNTER — Encounter (HOSPITAL_COMMUNITY): Payer: Self-pay

## 2019-10-23 ENCOUNTER — Ambulatory Visit (HOSPITAL_COMMUNITY)
Admission: RE | Admit: 2019-10-23 | Discharge: 2019-10-23 | Disposition: A | Discharge status: Home / Self Care | Payer: BC Managed Care – PPO | Source: Ambulatory Visit | Attending: Hematology | Admitting: Hematology

## 2019-10-23 DIAGNOSIS — C259 Malignant neoplasm of pancreas, unspecified: Secondary | ICD-10-CM | POA: Diagnosis not present

## 2019-10-23 DIAGNOSIS — C250 Malignant neoplasm of head of pancreas: Secondary | ICD-10-CM | POA: Diagnosis not present

## 2019-10-23 LAB — CANCER ANTIGEN 19-9: CA 19-9: 710 U/mL — ABNORMAL HIGH (ref 0–35)

## 2019-10-23 MED ORDER — SODIUM CHLORIDE (PF) 0.9 % IJ SOLN
INTRAMUSCULAR | Status: AC
  Filled 2019-10-23: qty 50

## 2019-10-23 MED ORDER — IOHEXOL 300 MG/ML  SOLN
100.0000 mL | Freq: Once | INTRAMUSCULAR | Status: AC | PRN
  Administered 2019-10-23: 100 mL via INTRAVENOUS

## 2019-10-24 ENCOUNTER — Other Ambulatory Visit: Payer: Self-pay | Admitting: Radiology

## 2019-10-25 ENCOUNTER — Other Ambulatory Visit: Payer: Self-pay

## 2019-10-25 ENCOUNTER — Ambulatory Visit (HOSPITAL_COMMUNITY)
Admission: RE | Admit: 2019-10-25 | Discharge: 2019-10-25 | Disposition: A | Discharge status: Home / Self Care | Payer: BC Managed Care – PPO | Source: Ambulatory Visit | Attending: Hematology | Admitting: Hematology

## 2019-10-25 ENCOUNTER — Encounter (HOSPITAL_COMMUNITY): Payer: Self-pay

## 2019-10-25 DIAGNOSIS — K5790 Diverticulosis of intestine, part unspecified, without perforation or abscess without bleeding: Secondary | ICD-10-CM | POA: Insufficient documentation

## 2019-10-25 DIAGNOSIS — Z79890 Hormone replacement therapy (postmenopausal): Secondary | ICD-10-CM | POA: Insufficient documentation

## 2019-10-25 DIAGNOSIS — Z5111 Encounter for antineoplastic chemotherapy: Secondary | ICD-10-CM | POA: Diagnosis not present

## 2019-10-25 DIAGNOSIS — C250 Malignant neoplasm of head of pancreas: Secondary | ICD-10-CM | POA: Diagnosis not present

## 2019-10-25 DIAGNOSIS — E039 Hypothyroidism, unspecified: Secondary | ICD-10-CM | POA: Diagnosis not present

## 2019-10-25 DIAGNOSIS — Z79899 Other long term (current) drug therapy: Secondary | ICD-10-CM | POA: Diagnosis not present

## 2019-10-25 DIAGNOSIS — C259 Malignant neoplasm of pancreas, unspecified: Secondary | ICD-10-CM | POA: Diagnosis not present

## 2019-10-25 HISTORY — PX: IR IMAGING GUIDED PORT INSERTION: IMG5740

## 2019-10-25 MED ORDER — DIPHENHYDRAMINE HCL 50 MG/ML IJ SOLN
INTRAMUSCULAR | Status: AC
  Filled 2019-10-25: qty 1

## 2019-10-25 MED ORDER — CEFAZOLIN SODIUM-DEXTROSE 2-4 GM/100ML-% IV SOLN
INTRAVENOUS | Status: AC
  Administered 2019-10-25: 2 g via INTRAVENOUS
  Filled 2019-10-25: qty 100

## 2019-10-25 MED ORDER — ONDANSETRON HCL 4 MG/2ML IJ SOLN
INTRAMUSCULAR | Status: AC
  Filled 2019-10-25: qty 2

## 2019-10-25 MED ORDER — MIDAZOLAM HCL 2 MG/2ML IJ SOLN
INTRAMUSCULAR | Status: AC
  Filled 2019-10-25: qty 4

## 2019-10-25 MED ORDER — DIPHENHYDRAMINE HCL 50 MG/ML IJ SOLN
INTRAMUSCULAR | Status: AC | PRN
  Administered 2019-10-25: 25 mg via INTRAVENOUS

## 2019-10-25 MED ORDER — SODIUM CHLORIDE 0.9 % IV SOLN: INTRAVENOUS | Status: DC

## 2019-10-25 MED ORDER — HEPARIN SOD (PORK) LOCK FLUSH 100 UNIT/ML IV SOLN
INTRAVENOUS | Status: AC | PRN
  Administered 2019-10-25: 500 [IU] via INTRAVENOUS

## 2019-10-25 MED ORDER — FENTANYL CITRATE (PF) 100 MCG/2ML IJ SOLN
INTRAMUSCULAR | Status: AC
  Filled 2019-10-25: qty 2

## 2019-10-25 MED ORDER — HEPARIN SOD (PORK) LOCK FLUSH 100 UNIT/ML IV SOLN
INTRAVENOUS | Status: AC
  Filled 2019-10-25: qty 5

## 2019-10-25 MED ORDER — CEFAZOLIN SODIUM-DEXTROSE 2-4 GM/100ML-% IV SOLN: 2.0000 g | INTRAVENOUS | Status: AC

## 2019-10-25 MED ORDER — MIDAZOLAM HCL 2 MG/2ML IJ SOLN
INTRAMUSCULAR | Status: AC | PRN
  Administered 2019-10-25 (×4): 1 mg via INTRAVENOUS

## 2019-10-25 MED ORDER — FENTANYL CITRATE (PF) 100 MCG/2ML IJ SOLN
INTRAMUSCULAR | Status: AC | PRN
  Administered 2019-10-25 (×2): 50 ug via INTRAVENOUS

## 2019-10-25 MED ORDER — LIDOCAINE-EPINEPHRINE 1 %-1:100000 IJ SOLN
INTRAMUSCULAR | Status: AC | PRN
  Administered 2019-10-25: 10 mL

## 2019-10-25 MED ORDER — LIDOCAINE-EPINEPHRINE 1 %-1:100000 IJ SOLN
INTRAMUSCULAR | Status: AC
  Filled 2019-10-25: qty 1

## 2019-10-25 NOTE — Discharge Instructions (Signed)
Moderate Conscious Sedation, Adult, Care After These instructions provide you with information about caring for yourself after your procedure. Your health care provider may also give you more specific instructions. Your treatment has been planned according to current medical practices, but problems sometimes occur. Call your health care provider if you have any problems or questions after your procedure. What can I expect after the procedure? After your procedure, it is common:  To feel sleepy for several hours.  To feel clumsy and have poor balance for several hours.  To have poor judgment for several hours.  To vomit if you eat too soon. Follow these instructions at home: For at least 24 hours after the procedure:   Do not: ? Participate in activities where you could fall or become injured. ? Drive. ? Use heavy machinery. ? Drink alcohol. ? Take sleeping pills or medicines that cause drowsiness. ? Make important decisions or sign legal documents. ? Take care of children on your own.  Rest. Eating and drinking  Follow the diet recommended by your health care provider.  If you vomit: ? Drink water, juice, or soup when you can drink without vomiting. ? Make sure you have little or no nausea before eating solid foods. General instructions  Have a responsible adult stay with you until you are awake and alert.  Take over-the-counter and prescription medicines only as told by your health care provider.  If you smoke, do not smoke without supervision.  Keep all follow-up visits as told by your health care provider. This is important. Contact a health care provider if:  You keep feeling nauseous or you keep vomiting.  You feel light-headed.  You develop a rash.  You have a fever. Get help right away if:  You have trouble breathing. This information is not intended to replace advice given to you by your health care provider. Make sure you discuss any questions you have  with your health care provider. Document Revised: 03/31/2017 Document Reviewed: 08/08/2015 Elsevier Patient Education  2020 [employer] Implanted Port Home Guide An implanted port is a device that is placed under the skin. It is usually placed in the chest. The device can be used to give IV medicine, to take blood, or for dialysis. You may have an implanted port if:  You need IV medicine that would be irritating to the small veins in your hands or arms.  You need IV medicines, such as antibiotics, for a long period of time.  You need IV nutrition for a long period of time.  You need dialysis. Having a port means that your health care provider will not need to use the veins in your arms for these procedures. You may have fewer limitations when using a port than you would if you used other types of long-term IVs, and you will likely be able to return to normal activities after your incision heals. An implanted port has two main parts:  Reservoir. The reservoir is the part where a needle is inserted to give medicines or draw blood. The reservoir is round. After it is placed, it appears as a small, raised area under your skin.  Catheter. The catheter is a thin, flexible tube that connects the reservoir to a vein. Medicine that is inserted into the reservoir goes into the catheter and then into the vein. How is my port accessed? To access your port:  A numbing cream may be placed on the skin over the port site.  Your health care provider   will put on a mask and sterile gloves.  The skin over your port will be cleaned carefully with a germ-killing soap and allowed to dry.  Your health care provider will gently pinch the port and insert a needle into it.  Your health care provider will check for a blood return to make sure the port is in the vein and is not clogged.  If your port needs to remain accessed to get medicine continuously (constant infusion), your health care provider will place  a clear bandage (dressing) over the needle site. The dressing and needle will need to be changed every week, or as told by your health care provider. What is flushing? Flushing helps keep the port from getting clogged. Follow instructions from your health care provider about how and when to flush the port. Ports are usually flushed with saline solution or a medicine called heparin. The need for flushing will depend on how the port is used:  If the port is only used from time to time to give medicines or draw blood, the port may need to be flushed: ? Before and after medicines have been given. ? Before and after blood has been drawn. ? As part of routine maintenance. Flushing may be recommended every 4-6 weeks.  If a constant infusion is running, the port may not need to be flushed.  Throw away any syringes in a disposal container that is meant for sharp items (sharps container). You can buy a sharps container from a pharmacy, or you can make one by using an empty hard plastic bottle with a cover. How long will my port stay implanted? The port can stay in for as long as your health care provider thinks it is needed. When it is time for the port to come out, a surgery will be done to remove it. The surgery will be similar to the procedure that was done to put the port in. Follow these instructions at home:   Flush your port as told by your health care provider.  If you need an infusion over several days, follow instructions from your health care provider about how to take care of your port site. Make sure you: ? Wash your hands with soap and water before you change your dressing. If soap and water are not available, use alcohol-based hand sanitizer. ? Change your dressing as told by your health care provider. ? Place any used dressings or infusion bags into a plastic bag. Throw that bag in the trash. ? Keep the dressing that covers the needle clean and dry. Do not get it wet. ? Do not use  scissors or sharp objects near the tube. ? Keep the tube clamped, unless it is being used.  Check your port site every day for signs of infection. Check for: ? Redness, swelling, or pain. ? Fluid or blood. ? Pus or a bad smell.  Protect the skin around the port site. ? Avoid wearing bra straps that rub or irritate the site. ? Protect the skin around your port from seat belts. Place a soft pad over your chest if needed.  Bathe or shower as told by your health care provider. The site may get wet as long as you are not actively receiving an infusion.  Return to your normal activities as told by your health care provider. Ask your health care provider what activities are safe for you.  Carry a medical alert card or wear a medical alert bracelet at all times. This will   let health care providers know that you have an implanted port in case of an emergency. Get help right away if:  You have redness, swelling, or pain at the port site.  You have fluid or blood coming from your port site.  You have pus or a bad smell coming from the port site.  You have a fever. Summary  Implanted ports are usually placed in the chest for long-term IV access.  Follow instructions from your health care provider about flushing the port and changing bandages (dressings).  Take care of the area around your port by avoiding clothing that puts pressure on the area, and by watching for signs of infection.  Protect the skin around your port from seat belts. Place a soft pad over your chest if needed.  Get help right away if you have a fever or you have redness, swelling, pain, drainage, or a bad smell at the port site. This information is not intended to replace advice given to you by your health care provider. Make sure you discuss any questions you have with your health care provider. Document Revised: 08/10/2018 Document Reviewed: 05/21/2016 Elsevier Patient Education  2020 [employer] Inc[employer]  

## 2019-10-25 NOTE — Procedures (Signed)
Pre Procedure Dx: Poor venous access Post Procedural Dx: Same  Successful placement of right IJ approach port-a-cath with tip at the superior caval atrial junction. The catheter is ready for immediate use.  Estimated Blood Loss: Minimal  Complications: None immediate.  Jay Zhanae Proffit, MD Pager #: [phone]   

## 2019-10-25 NOTE — H&P (Signed)
Chief Complaint: Patient was seen in consultation today for pancreatic cancer  Referring Physician(s): Feng,Yan  Supervising Physician: Sandi Mariscal  Patient Status: [name] - Out-pt  History of Present Illness: Breanna Noble is a 59 y.o. female with past medical history of diverticulosis, hypothyroidism who presented to her PCP in May 2021 with jaudice and 15 lbs weight loss.  Patient underwent endscopy 10/10/19 with Dr. Paulita Fujita which showed a mass at the junction of the head of the pancreas.  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  A metal stent was also placed for obstruction/stricture identified.  She is now referred to IR by Dr. Burr Medico for Port-A-Cath placement for the initiation of chemotherapy.   Patient presents to [facility] today in her usual state of health. She denies fever, chills, nausea, vomiting, abdominal pain, dysuria.  She has been NPO.  Her Tbili has trended down from 8.8  5.5. She is hopeful to start chemotherapy once her Tbili has further decreased.  Past Medical History:  Diagnosis Date  . Cataracts, bilateral   . Diverticulosis 12/07/2001   found on colonoscopy  . Hypothyroid 10/2007  . Macular hole   . Pancreatic mass   . PONV (postoperative nausea and vomiting)     Past Surgical History:  Procedure Laterality Date  . BILIARY STENT PLACEMENT  10/10/2019   Procedure: BILIARY STENT PLACEMENT;  Surgeon: Arta Silence, MD;  Location: [place];  Service: Endoscopy;;  . CATARACT EXTRACTION, BILATERAL    . COLONOSCOPY    . ERCP N/A 10/10/2019   Procedure: ENDOSCOPIC RETROGRADE CHOLANGIOPANCREATOGRAPHY (ERCP);  Surgeon: Arta Silence, MD;  Location: [facility] ENDOSCOPY;  Service: Endoscopy;  Laterality: N/A;  . ESOPHAGOGASTRODUODENOSCOPY N/A 10/10/2019   Procedure: ESOPHAGOGASTRODUODENOSCOPY (EGD);  Surgeon: Arta Silence, MD;  Location: [facility] ENDOSCOPY;  Service: Endoscopy;  Laterality: N/A;  . EUS N/A 10/10/2019   Procedure: UPPER  ENDOSCOPIC ULTRASOUND (EUS) LINEAR;  Surgeon: Arta Silence, MD;  Location: [place];  Service: Endoscopy;  Laterality: N/A;  . FINE NEEDLE ASPIRATION  10/10/2019   Procedure: FINE NEEDLE ASPIRATION (FNA) LINEAR;  Surgeon: Arta Silence, MD;  Location: [place] ENDOSCOPY;  Service: Endoscopy;;  . Macular hole surg    . PANCREATIC STENT PLACEMENT  10/10/2019   Procedure: PANCREATIC STENT PLACEMENT;  Surgeon: Arta Silence, MD;  Location: [facility] ENDOSCOPY;  Service: Endoscopy;;  . SPHINCTEROTOMY  10/10/2019   Procedure: SPHINCTEROTOMY;  Surgeon: Arta Silence, MD;  Location: [facility] ENDOSCOPY;  Service: Endoscopy;;  . TONSILLECTOMY      Allergies: Patient has no known allergies.  Medications: Prior to Admission medications   Medication Sig Start Date End Date Taking? Authorizing Provider  Ascorbic Acid (VITAMIN C PO) Take 1,000 mg by mouth daily.     [provider]  B Complex Vitamins (B-COMPLEX/B-12 PO) Take by mouth.    [provider]  Calcium Carb-Cholecalciferol (CALCIUM 1000 + D PO) Take by mouth.    [provider]  diphenhydrAMINE (BENADRYL) 25 MG tablet Take 25 mg by mouth at bedtime.    [provider]  ferrous sulfate 325 (65 FE) MG tablet Take 325 mg by mouth daily with breakfast.    [provider]  hydrOXYzine (ATARAX/VISTARIL) 25 MG tablet Take 1 tablet (25 mg total) by mouth 3 (three) times daily as needed for itching. 10/16/19   Truitt Merle, MD  levothyroxine (SYNTHROID, LEVOTHROID) 50 MCG tablet Take 1 tablet (50 mcg total) by mouth daily. 06/13/13   Vernie Shanks, MD  Multiple Vitamin (MULTIVITAMIN  PO) Take by mouth.      [provider]  mupirocin ointment (BACTROBAN) 2 % Place 1 application into the nose daily as needed (sore in nose).    [provider]     Family History  Problem Relation Age of Onset  . Hypertension Father   . Heart disease Father   . Cancer Maternal Grandmother        PANCREATIC  . Heart  disease Maternal Grandfather   . Cancer Maternal Grandfather        lung cancer  . Diabetes Paternal Grandfather   . Cancer Maternal Uncle        Colon  . Other Mother        Brain tumor benign  . Breast cancer Neg Hx     Social History   Socioeconomic History  . Marital status: Married    Spouse name: Not on file  . Number of children: 2  . Years of education: Not on file  . Highest education level: Not on file  Occupational History  . Not on file  Tobacco Use  . Smoking status: Never Smoker  . Smokeless tobacco: Never Used  Vaping Use  . Vaping Use: Never used  Substance and Sexual Activity  . Alcohol use: No    Alcohol/week: 0.0 standard drinks  . Drug use: No  . Sexual activity: Yes    Birth control/protection: Post-menopausal    Comment: 1st intercourse 70 yo-2 partners  Other Topics Concern  . Not on file  Social History Narrative  . Not on file   Social Determinants of Health   Financial Resource Strain:   . Difficulty of Paying Living Expenses:   Food Insecurity:   . Worried About Charity fundraiser in the Last Year:   . Arboriculturist in the Last Year:   Transportation Needs:   . Film/video editor (Medical):   Marland Kitchen Lack of Transportation (Non-Medical):   Physical Activity:   . Days of Exercise per Week:   . Minutes of Exercise per Session:   Stress:   . Feeling of Stress :   Social Connections:   . Frequency of Communication with Friends and Family:   . Frequency of Social Gatherings with Friends and Family:   . Attends Religious Services:   . Active Member of Clubs or Organizations:   . Attends Archivist Meetings:   Marland Kitchen Marital Status:      Review of Systems: A 12 point ROS discussed and pertinent positives are indicated in the HPI above.  All other systems are negative.  Review of Systems  Constitutional: Negative for fatigue and fever.  Respiratory: Negative for cough and shortness of breath.   Cardiovascular: Negative for  chest pain.  Gastrointestinal: Negative for abdominal pain, diarrhea, nausea and vomiting.  Genitourinary: Negative for dysuria.  Musculoskeletal: Negative for back pain.  Psychiatric/Behavioral: Negative for behavioral problems and confusion.    Vital Signs: BP 111/60   Pulse 72   Temp 98.1 F (36.7 C) (Oral)   Resp 16   LMP 11/22/2013   SpO2 100%   Physical Exam Vitals and nursing note reviewed.  Constitutional:      General: She is not in acute distress.    Appearance: Normal appearance. She is not ill-appearing.  HENT:     Mouth/Throat:     Mouth: Mucous membranes are moist.     Pharynx: Oropharynx is clear.  Cardiovascular:     Rate and Rhythm:  Normal rate and regular rhythm.  Pulmonary:     Effort: Pulmonary effort is normal. No respiratory distress.     Breath sounds: Normal breath sounds.  Abdominal:     General: Abdomen is flat.     Palpations: Abdomen is soft.  Musculoskeletal:     Cervical back: Normal range of motion and neck supple.  Skin:    General: Skin is warm and dry.  Neurological:     General: No focal deficit present.     Mental Status: She is alert and oriented to person, place, and time. Mental status is at baseline.  Psychiatric:        Mood and Affect: Mood normal.        Behavior: Behavior normal.        Thought Content: Thought content normal.        Judgment: Judgment normal.      MD Evaluation Airway: WNL Heart: WNL Abdomen: WNL Chest/ Lungs: WNL ASA  Classification: 3 Mallampati/Airway Score: Two   Imaging: CT Chest W Contrast  Result Date: 10/23/2019 CLINICAL DATA:  Pancreatic cancer. Elevated bilirubin. Mass seen on ultrasound and MRI. EXAM: CT CHEST, ABDOMEN, AND PELVIS WITH CONTRAST TECHNIQUE: Multidetector CT imaging of the chest, abdomen and pelvis was performed following the standard protocol during bolus administration of intravenous contrast. CONTRAST:  130mL OMNIPAQUE IOHEXOL 300 MG/ML  SOLN COMPARISON:  10/07/2019  FINDINGS: CT CHEST FINDINGS Cardiovascular: Normal appearance of thoracic aorta. Heart size normal. Central pulmonary vasculature on venous phase assessment normal caliber. Mediastinum/Nodes: No thoracic inlet adenopathy. No axillary lymphadenopathy. No mediastinal or hilar lymphadenopathy. Esophagus mildly patulous otherwise unremarkable. Lungs/Pleura: No suspicious mass, nodule or consolidative change. No pleural effusion. Airways are patent. Musculoskeletal: No acute or destructive bone process associated with the bony thorax. Spinal degenerative changes. CT ABDOMEN PELVIS FINDINGS Hepatobiliary: Pneumobilia post stenting. Stent the common bile duct. No focal hepatic lesion. Contrast from previous ERCP and stent placement within the gallbladder. Pancreas: Mass in the head of the pancreas extends posteriorly towards the high intra-aortocaval groove below the LEFT renal vein. Mass measures approximately 3.0 x 1.3 cm. Pancreatic ductal dilation as before. Some stranding about the celiac axis. SMA and portal vein are clear. Stranding not seen on previous MRI, potentially related to recent biopsy in this area. Small peripancreatic lymph nodes with RIGHT juxta crural lymph node (image 102, series 2) 8 mm. There are likely LEFT retroperitoneal collaterals but also small lymph nodes track along the LEFT periaortic chain in addition of venous collaterals also small lymph nodes in the intra-aortocaval groove (image 116, series 2) 7 mm. LEFT periaortic lymph nodes as large as 1 cm with numerous small nodes along the LEFT periaortic chain (image 81, series 11) Spleen: Spleen normal size and contour.  No focal splenic lesion. Adrenals/Urinary Tract: Adrenal glands are normal. Symmetric renal enhancement. No hydronephrosis. Urinary bladder is unremarkable. Stomach/Bowel: No sign of bowel obstruction or gastric outlet obstruction. Stomach is under distended limiting assessment. Appendix is normal. Colon is stool filled.  Vascular/Lymphatic: As above. No aneurysmal dilation. Numerous small lymph nodes in the retroperitoneum amongst retroperitoneal collaterals some arising directly from the LEFT renal vein. No pelvic adenopathy. Borderline enlarged RIGHT external iliac lymph node (image 155, series 11), nonspecific. Is Reproductive: Uterus and adnexa grossly normal by CT. Other: No ascites.  No peritoneal nodularity. Musculoskeletal: Spinal degenerative changes without acute or destructive bone process. IMPRESSION: 1. Pancreatic mass with local adenopathy and potential retroperitoneal adenopathy. Given retroperitoneal lymph nodes and presence  of venous collaterals PET scan may be useful for staging purposes. 2. No definite vascular involvement or signs of upper abdominal venous collaterals. Some stranding around the celiac may be due to recent biopsy. 3. Borderline enlarged RIGHT external iliac lymph node. Attention on follow-up. 4. No evidence of metastatic disease in the chest. 5. Signs of recent ERCP and stent placement. Electronically Signed   By: Zetta Bills M.D.   On: 10/23/2019 17:16   MR ABDOMEN WWO CONTRAST  Result Date: 10/07/2019 CLINICAL DATA:  Pancreatic mass, jaundice EXAM: MRI ABDOMEN WITHOUT AND WITH CONTRAST TECHNIQUE: Multiplanar multisequence MR imaging of the abdomen was performed both before and after the administration of intravenous contrast. CONTRAST:  6.67mL GADAVIST GADOBUTROL 1 MMOL/ML IV SOLN COMPARISON:  Abdominal ultrasound, 10/03/2019 FINDINGS: Lower chest: No acute findings. Hepatobiliary: No hepatic mass or other parenchymal abnormality identified. Gross intra and extrahepatic biliary ductal dilatation, common bile duct measuring 1.4 cm. Gallbladder hydrops. Pancreas: There is a hypoenhancing mass of the pancreatic uncinate with abrupt obstruction of the central common bile duct, mass measuring approximately 2.9 x 1.8 x 2.7 cm (series 16, image 41). The pancreatic duct is nondilated. Spleen:   Within normal limits in size and appearance. Adrenals/Urinary Tract: No masses identified. No evidence of hydronephrosis. Stomach/Bowel: Visualized portions within the abdomen are unremarkable. Vascular/Lymphatic: No pathologically enlarged lymph nodes identified. No abdominal aortic aneurysm demonstrated. Pancreatic mass lies closely adjacent to the most central portions of the superior mesenteric vein and central portal vein, due to motion artifact it is difficult to clearly discern whether there is a preserved fat plane (e.g. series 18, image 39). Other:  None. Musculoskeletal: No suspicious bone lesions identified. IMPRESSION: 1. There is a hypoenhancing mass of the pancreatic uncinate with abrupt obstruction of the central common bile duct, measuring approximately 2.9 cm, consistent with pancreatic adenocarcinoma. 2. Gross intra and extrahepatic biliary ductal dilatation, common bile duct measuring 1.4 cm. Gallbladder hydrops. 3. The pancreatic duct is nondilated. 4. Pancreatic mass lies closely adjacent to the most central portions of the superior mesenteric vein and central portal vein, due to motion artifact it is difficult to clearly discern whether there is a preserved fat plane. Multiphasic contrast enhanced pancreatic protocol CT may be less motion sensitive and better delineate vascular structures for the purposes of surgical planning if necessary. 5. No evidence of lymphadenopathy or metastatic disease in the abdomen. These results will be called to the ordering clinician or representative by the Radiologist Assistant, and communication documented in the PACS or [employer]. Electronically Signed   By: Eddie Candle M.D.   On: 10/07/2019 16:09   US Abdomen Complete  Result Date: 10/03/2019 CLINICAL DATA:  Jaundice EXAM: ABDOMEN ULTRASOUND COMPLETE COMPARISON:  None. FINDINGS: Gallbladder: Distended gallbladder containing sludge. No gallstones or gallbladder wall thickening. Negative sonographic  Murphy sign. Common bile duct: Diameter: Markedly dilated at 17 mm. Diffuse intrahepatic biliary dilatation also present. Liver: No focal liver mass lesion. Normal hepatic echogenicity. Portal vein is patent on color Doppler imaging with normal direction of blood flow towards the liver. IVC: No abnormality visualized. Pancreas: Hypoechoic mass uncinate process measuring 2.9 cm. Probable neoplasm causing obstruction of the bile ducts. Spleen: Size and appearance within normal limits. Right Kidney: Length: 9.8 cm. Echogenicity within normal limits. No mass or hydronephrosis visualized. Left Kidney: Length: 10.8 cm. Echogenicity within normal limits. No mass or hydronephrosis visualized. Abdominal aorta: No aneurysm visualized. Other findings: None. IMPRESSION: Markedly dilated bile ducts. Dilated gallbladder with sludge but no  gallstones. 2.9 cm mass in the uncinate process most likely pancreatic neoplasm causing biliary obstruction. Recommend MRI of the pancreas without with contrast for further evaluation. If the patient cannot have MRI, CT of the pancreas with contrast recommended. Electronically Signed   By: Franchot Gallo M.D.   On: 10/03/2019 16:55   CT Abdomen Pelvis W Contrast  Result Date: 10/23/2019 CLINICAL DATA:  Pancreatic cancer. Elevated bilirubin. Mass seen on ultrasound and MRI. EXAM: CT CHEST, ABDOMEN, AND PELVIS WITH CONTRAST TECHNIQUE: Multidetector CT imaging of the chest, abdomen and pelvis was performed following the standard protocol during bolus administration of intravenous contrast. CONTRAST:  173mL OMNIPAQUE IOHEXOL 300 MG/ML  SOLN COMPARISON:  10/07/2019 FINDINGS: CT CHEST FINDINGS Cardiovascular: Normal appearance of thoracic aorta. Heart size normal. Central pulmonary vasculature on venous phase assessment normal caliber. Mediastinum/Nodes: No thoracic inlet adenopathy. No axillary lymphadenopathy. No mediastinal or hilar lymphadenopathy. Esophagus mildly patulous otherwise  unremarkable. Lungs/Pleura: No suspicious mass, nodule or consolidative change. No pleural effusion. Airways are patent. Musculoskeletal: No acute or destructive bone process associated with the bony thorax. Spinal degenerative changes. CT ABDOMEN PELVIS FINDINGS Hepatobiliary: Pneumobilia post stenting. Stent the common bile duct. No focal hepatic lesion. Contrast from previous ERCP and stent placement within the gallbladder. Pancreas: Mass in the head of the pancreas extends posteriorly towards the high intra-aortocaval groove below the LEFT renal vein. Mass measures approximately 3.0 x 1.3 cm. Pancreatic ductal dilation as before. Some stranding about the celiac axis. SMA and portal vein are clear. Stranding not seen on previous MRI, potentially related to recent biopsy in this area. Small peripancreatic lymph nodes with RIGHT juxta crural lymph node (image 102, series 2) 8 mm. There are likely LEFT retroperitoneal collaterals but also small lymph nodes track along the LEFT periaortic chain in addition of venous collaterals also small lymph nodes in the intra-aortocaval groove (image 116, series 2) 7 mm. LEFT periaortic lymph nodes as large as 1 cm with numerous small nodes along the LEFT periaortic chain (image 81, series 11) Spleen: Spleen normal size and contour.  No focal splenic lesion. Adrenals/Urinary Tract: Adrenal glands are normal. Symmetric renal enhancement. No hydronephrosis. Urinary bladder is unremarkable. Stomach/Bowel: No sign of bowel obstruction or gastric outlet obstruction. Stomach is under distended limiting assessment. Appendix is normal. Colon is stool filled. Vascular/Lymphatic: As above. No aneurysmal dilation. Numerous small lymph nodes in the retroperitoneum amongst retroperitoneal collaterals some arising directly from the LEFT renal vein. No pelvic adenopathy. Borderline enlarged RIGHT external iliac lymph node (image 155, series 11), nonspecific. Is Reproductive: Uterus and adnexa  grossly normal by CT. Other: No ascites.  No peritoneal nodularity. Musculoskeletal: Spinal degenerative changes without acute or destructive bone process. IMPRESSION: 1. Pancreatic mass with local adenopathy and potential retroperitoneal adenopathy. Given retroperitoneal lymph nodes and presence of venous collaterals PET scan may be useful for staging purposes. 2. No definite vascular involvement or signs of upper abdominal venous collaterals. Some stranding around the celiac may be due to recent biopsy. 3. Borderline enlarged RIGHT external iliac lymph node. Attention on follow-up. 4. No evidence of metastatic disease in the chest. 5. Signs of recent ERCP and stent placement. Electronically Signed   By: Zetta Bills M.D.   On: 10/23/2019 17:16   DG ERCP BILIARY & PANCREATIC DUCTS  Result Date: 10/10/2019 CLINICAL DATA:  ERCP for obstructive jaundice. EXAM: ERCP TECHNIQUE: Multiple spot images obtained with the fluoroscopic device and submitted for interpretation post-procedure. FLUOROSCOPY TIME:  Fluoroscopy Time:  11 minutes and  6 seconds Radiation Exposure Index (if provided by the fluoroscopic device): 117.78 mGy Number of Acquired Spot Images: 6 COMPARISON:  MRI dated October 07, 2019 FINDINGS: There has been cannulation of the common bile duct and pancreatic duct. There was placement of a plastic pancreatic stent. There is placement of a metallic common bile duct stent. There is narrowing of the distal common bile duct corresponding to the finding seen on the patient's recent MRI. There is intrahepatic biliary ductal dilatation. IMPRESSION: Status post ERCP with stent placement as detailed above. These images were submitted for radiologic interpretation only. Please see the procedural report for the amount of contrast and the fluoroscopy time utilized. Electronically Signed   By: Constance Holster M.D.   On: 10/10/2019 19:11    Labs:  CBC: Recent Labs    10/16/19 1215 10/22/19 1059  WBC 10.6* 6.5   HGB 11.8* 10.6*  HCT 35.5* 32.9*  PLT 439* 358    COAGS: No results for input(s): INR, APTT in the last 8760 hours.  BMP: Recent Labs    10/16/19 1215 10/22/19 1059  NA 137 140  K 3.7 3.5  CL 100 103  CO2 28 29  GLUCOSE 118* 108*  BUN 7 7  CALCIUM 9.1 8.7*  CREATININE 0.67 0.68  GFRNONAA >60 >60  GFRAA >60 >60    LIVER FUNCTION TESTS: Recent Labs    10/16/19 1215 10/22/19 1059  BILITOT 8.8* 5.3*  AST 80* 72*  ALT 73* 72*  ALKPHOS 247* 219*  PROT 6.4* 6.0*  ALBUMIN 3.0* 2.8*    TUMOR MARKERS: No results for input(s): AFPTM, CEA, CA199, CHROMGRNA in the last 8760 hours.  Assessment and Plan: Patient with past medical history of hypothyroidism presents with complaint of newly diagnosed pancreatic cancer.  In need of durable venous access for chemotherapy initiation.  IR consulted for Port-A-Cath placement at the request of Dr. Burr Medico. Case reviewed by Dr. Pascal Lux who approves patient for procedure.  Patient presents today in their usual state of health.  She has been NPO and is not currently on blood thinners.  Mrs. Suchan requests no sedation for the procedure today.  She reports a history of severe PONV with both moderate and deep sedation.  Discussed the use of IV benadryl and fentanyl for the procedure today.  She does have a ride home with her husband today.  Will discuss with IR MD.   Risks and benefits of image guided port-a-catheter placement was discussed with the patient including, but not limited to bleeding, infection, pneumothorax, or fibrin sheath development and need for additional procedures.  All of the patient's questions were answered, patient is agreeable to proceed. Consent signed and in chart.  Thank you for this interesting consult.  I greatly enjoyed meeting Breanna Noble and look forward to participating in their care.  A copy of this report was sent to the requesting provider on this date.  Electronically Signed: Docia Barrier,  PA 10/25/2019, 9:47 AM   I spent a total of  30 Minutes   in face to face in clinical consultation, greater than 50% of which was counseling/coordinating care for pancreatic cancer.

## 2019-10-29 NOTE — Progress Notes (Addendum)
[place]   Telephone:(336) [phone] Fax:(336) [phone]   Clinic Follow up Note   Patient Care Team: Vernie Shanks, MD as PCP - General (Family Medicine) Fontaine, Belinda Block, MD (Inactive) as Consulting Physician (Gynecology) Truitt Merle, MD as Consulting Physician (Oncology) Jonnie Finner, RN as Registered Nurse Arta Silence, MD as Consulting Physician (Gastroenterology) Stark Klein, MD as Consulting Physician (General Surgery) 10/30/2019  CHIEF COMPLAINT: F/u pancreas cancer   SUMMARY OF ONCOLOGIC HISTORY: Oncology History Overview Note  Cancer Staging Pancreatic cancer [facility]) Staging form: Exocrine Pancreas, AJCC 8th Edition - Clinical stage from 10/16/2019: Stage IIB (cT2, cN1, cM0) - Signed by Truitt Merle, MD on 10/16/2019    Pancreatic cancer ([place])  10/03/2019 Imaging   US Abdomen 10/03/19  IMPRESSION: Markedly dilated bile ducts. Dilated gallbladder with sludge but no gallstones.   2.9 cm mass in the uncinate process most likely pancreatic neoplasm causing biliary obstruction. Recommend MRI of the pancreas without with contrast for further evaluation. If the patient cannot have MRI, CT of the pancreas with contrast recommended.   10/07/2019 Imaging   MRI Abdomen 10/07/19  IMPRESSION: 1. There is a hypoenhancing mass of the pancreatic uncinate with abrupt obstruction of the central common bile duct, measuring approximately 2.9 cm, consistent with pancreatic adenocarcinoma. 2. Gross intra and extrahepatic biliary ductal dilatation, common bile duct measuring 1.4 cm. Gallbladder hydrops. 3. The pancreatic duct is nondilated. 4. Pancreatic mass lies closely adjacent to the most central portions of the superior mesenteric vein and central portal vein, due to motion artifact it is difficult to clearly discern whether there is a preserved fat plane. Multiphasic contrast enhanced pancreatic protocol CT may be less motion sensitive and better delineate  vascular structures for the purposes of surgical planning if necessary. 5. No evidence of lymphadenopathy or metastatic disease in the abdomen.   10/10/2019 Procedure   EUS by Dr Paulita Fujita 10/10/19  IMPRESSION - A mass was identified at junction of head/uncinate process of the pancreas. This was staged T2 N1 Mx by endosonographic criteria. Fine needle aspiration performed. - A few abnormal lymph nodes were visualized in the peripancreatic region. - There was dilation in the common bile duct which measured up to 14 mm. - Hyperechoic material consistent with sludge was visualized endosonographically in the gallbladder.   10/10/2019 Procedure   ERCP by Dr Paulita Fujita 10/10/19  IMPRESSION - The major papilla appeared normal. - One temporary stent was placed into the ventral pancreatic duct. - A biliary sphincterotomy was performed. - One covered metal stent was placed into the common bile duct.   10/10/2019 Initial Biopsy   FINAL MICROSCOPIC DIAGNOSIS: 10/10/19  A. PANCREAS, HEAD, FINE NEEDLE ASPIRATION:  - Malignant cells present, consistent with adenocarcinoma    10/16/2019 Initial Diagnosis   Pancreatic cancer ([place])   10/16/2019 Cancer Staging   Staging form: Exocrine Pancreas, AJCC 8th Edition - Clinical stage from 10/16/2019: Stage IIB (cT2, cN1, cM0) - Signed by Truitt Merle, MD on 10/16/2019     CURRENT THERAPY: PENDING Neoadjuvant FOLFIRINOX, to start week of 11/04/19   INTERVAL HISTORY: Breanna Noble returns for f/u as scheduled. Since last visit she was seen by Dr. Barry Dienes who feels she is a surgical candidate after neoadjuvant chemo. She underwent CT CAP pancreatic protocol and port placement by IR. Port is little sore and "pulls" when she lies flat. Since last visit "everything feels good except itching" which continues intermittently, improving gradually. Benadryl did not help much. Urine is lighter. Denies abdominal  pain. Denies n/v/c/d. Her energy and appetite have improved. She is able to do  her normal activities without difficulty, she is 90% back to baseline. She spoke with dietician who recommended premier protein.   Otherwise, denies fever, chills, chest pain, dyspnea, leg edema. She has dry cough since endoscopy which is stable.   MEDICAL HISTORY:  Past Medical History:  Diagnosis Date  . Cataracts, bilateral   . Diverticulosis 12/07/2001   found on colonoscopy  . Hypothyroid 10/2007  . Macular hole   . Pancreatic mass   . PONV (postoperative nausea and vomiting)     SURGICAL HISTORY: Past Surgical History:  Procedure Laterality Date  . BILIARY STENT PLACEMENT  10/10/2019   Procedure: BILIARY STENT PLACEMENT;  Surgeon: Arta Silence, MD;  Location: [place];  Service: Endoscopy;;  . CATARACT EXTRACTION, BILATERAL    . COLONOSCOPY    . ERCP N/A 10/10/2019   Procedure: ENDOSCOPIC RETROGRADE CHOLANGIOPANCREATOGRAPHY (ERCP);  Surgeon: Arta Silence, MD;  Location: [facility] ENDOSCOPY;  Service: Endoscopy;  Laterality: N/A;  . ESOPHAGOGASTRODUODENOSCOPY N/A 10/10/2019   Procedure: ESOPHAGOGASTRODUODENOSCOPY (EGD);  Surgeon: Arta Silence, MD;  Location: [facility] ENDOSCOPY;  Service: Endoscopy;  Laterality: N/A;  . EUS N/A 10/10/2019   Procedure: UPPER ENDOSCOPIC ULTRASOUND (EUS) LINEAR;  Surgeon: Arta Silence, MD;  Location: [place];  Service: Endoscopy;  Laterality: N/A;  . FINE NEEDLE ASPIRATION  10/10/2019   Procedure: FINE NEEDLE ASPIRATION (FNA) LINEAR;  Surgeon: Arta Silence, MD;  Location: [place];  Service: Endoscopy;;  . IR IMAGING GUIDED PORT INSERTION  10/25/2019  . Macular hole surg    . PANCREATIC STENT PLACEMENT  10/10/2019   Procedure: PANCREATIC STENT PLACEMENT;  Surgeon: Arta Silence, MD;  Location: [facility] ENDOSCOPY;  Service: Endoscopy;;  . SPHINCTEROTOMY  10/10/2019   Procedure: SPHINCTEROTOMY;  Surgeon: Arta Silence, MD;  Location: [facility] ENDOSCOPY;  Service: Endoscopy;;  . TONSILLECTOMY      I have reviewed the social history and family  history with the patient and they are unchanged from previous note.  ALLERGIES:  has No Known Allergies.  MEDICATIONS:  Current Outpatient Medications  Medication Sig Dispense Refill  . Ascorbic Acid (VITAMIN C PO) Take 1,000 mg by mouth daily.     . B Complex Vitamins (B-COMPLEX/B-12 PO) Take by mouth.    . Calcium Carb-Cholecalciferol (CALCIUM 1000 + D PO) Take by mouth.    . ferrous sulfate 325 (65 FE) MG tablet Take 325 mg by mouth daily with breakfast.    . levothyroxine (SYNTHROID, LEVOTHROID) 50 MCG tablet Take 1 tablet (50 mcg total) by mouth daily. 90 tablet 4  . Multiple Vitamin (MULTIVITAMIN PO) Take by mouth.      . mupirocin ointment (BACTROBAN) 2 % Place 1 application into the nose daily as needed (sore in nose).    Marland Kitchen diphenhydrAMINE (BENADRYL) 25 MG tablet Take 25 mg by mouth at bedtime. (Patient not taking: Reported on 10/30/2019)    . hydrOXYzine (ATARAX/VISTARIL) 25 MG tablet Take 1 tablet (25 mg total) by mouth 3 (three) times daily as needed for itching. (Patient not taking: Reported on 10/30/2019) 60 tablet 1   No current facility-administered medications for this visit.    PHYSICAL EXAMINATION: ECOG PERFORMANCE STATUS: 1 - Symptomatic but completely ambulatory  Vitals:   10/30/19 1107  BP: 124/69  Pulse: 90  Resp: 17  Temp: 98.7 F (37.1 C)  SpO2: 100%   Filed Weights   10/30/19 1107  Weight: 131 lb 4.8 oz (59.6 kg)  GENERAL:alert, no distress and comfortable SKIN: mild juandice, no rash  EYES: mild icterus  NECK: Without mass LUNGS: clear with normal breathing effort HEART: regular rate & rhythm, no lower extremity edema ABDOMEN: abdomen soft, non-tender and normal bowel sounds NEURO: alert & oriented x 3 with fluent speech, no focal motor/sensory deficits PAC incision covered with Steri-Strips, mild ecchymosis at the site  LABORATORY DATA:  I have reviewed the data as listed CBC Latest Ref Rng & Units 10/30/2019 10/22/2019 10/16/2019  WBC 4.0 -  10.5 K/uL 5.9 6.5 10.6(H)  Hemoglobin 12.0 - 15.0 g/dL 11.9(L) 10.6(L) 11.8(L)  Hematocrit 36 - 46 % 37.0 32.9(L) 35.5(L)  Platelets 150 - 400 K/uL 246 358 439(H)     CMP Latest Ref Rng & Units 10/30/2019 10/22/2019 10/16/2019  Glucose 70 - 99 mg/dL 118(H) 108(H) 118(H)  BUN 6 - 20 mg/dL 8 7 7   Creatinine 0.44 - 1.00 mg/dL 0.69 0.68 0.67  Sodium 135 - 145 mmol/L 139 140 137  Potassium 3.5 - 5.1 mmol/L 3.9 3.5 3.7  Chloride 98 - 111 mmol/L 103 103 100  CO2 22 - 32 mmol/L 29 29 28   Calcium 8.9 - 10.3 mg/dL 9.1 8.7(L) 9.1  Total Protein 6.5 - 8.1 g/dL 6.7 6.0(L) 6.4(L)  Total Bilirubin 0.3 - 1.2 mg/dL 2.7(H) 5.3(HH) 8.8(HH)  Alkaline Phos 38 - 126 U/L 165(H) 219(H) 247(H)  AST 15 - 41 U/L 53(H) 72(H) 80(H)  ALT 0 - 44 U/L 65(H) 72(H) 73(H)      RADIOGRAPHIC STUDIES: I have personally reviewed the radiological images as listed and agreed with the findings in the report. No results found.   ASSESSMENT & PLAN: Breanna Noble is a 59 y.o. caucasian female with a history of Cataracts, Diverticulosis, Hypothyroidism  1. Pancreatic Cancer, Stage IIB, T2N1M0 -Her US showed bile duct and gallbladder dilation due to 2-3cm mass at head of pancreas/Uncinate. Her MRI showed her mass is laying close to SMV but not involving it. There is no evidence of lymphadenopathy or metastatic disease in the abdomen on MRI. -She underwent EUS and ERCP on 10/10/19 and had 2 stents placed by Dr Paulita Fujita. Her pancreatic mass biopsy showed Adenocarcinoma of Pancreas. Endoscopic staging T2N1, no evidence of vascular involvement. -She was seen by Dr. Barry Dienes who felt she is resectable after a course of neoadjuvant chemo -She underwent CT CAP with pancreatic protocol showing the pancreatic mass with local adenopathy and borderline potential adenopathy in the periaortic region which will be followed on restaging scans.  There is no definite vascular involvement or distant metastasis. We reviewed this with the patient and her  husband today -Her case was reviewed in GI tumor conference on 10/30/19 -Again the plan is for neoadjuvant chemo followed by restaging scans and Whipple surgery -Ms. Krauser appears stable.  Her energy and appetite have improved.  She denies pain.  Her clinical signs of jaundice are improving.   -Given her young age and good performance status Dr. Burr Medico recommends intensive neoadjuvant chemo with FOLFIRINOX q2 weeks for 8 cycles  -Chemotherapy consent: Side effects including but not limited to fatigue, nausea, vomiting, diarrhea, hair loss, neuropathy, cold sensitivity, hand/foot syndrome, fluid retention, renal and kidney dysfunction, neutropenic fever, need for blood transfusion, bleeding, were discussed with patient in great detail. She agrees to proceed. -The goal of neoadjuvant chemo is curative -We will call in antiemetics for her and EMLA cream.  She attended chemo teaching session.  She tolerated port placement. -Labs reviewed, CBC is stable, CMP  improving, T bili 2.7.  Labs adequate to proceed with cycle 1 FOLFIRINOX next week. -We will do a phone vs office follow-up in 2 weeks for toxicity check.  She will then return in 3 weeks for follow-up with cycle 2. -The patient and her husband were given opportunities to ask questions.  They agree with the plan  2. Obstructive Jaundice  -She had 3 weeks of Jaundice of eyes, skin and urine. She underwent ERCP with Dr Paulita Fujita for CBD and temporary ventral pancreatic duct stents placement on 10/10/19.   -Her case was reviewed in tumor board, stent is in good position -LFTs and hyperbilirubinemia continue to improve T bili 2.7 today, clinical jaundice is improving -We will monitor on chemo  3. Upper Abdominal Pain, Mild weight loss  -Presented with initial jaundice but since stent placement her pain has much improved.  -Pain resolved -Weight is stable, she has met with our dietitian.  Continue supplements  4. Genetic Testing -She has maternal family  history of pancreatic and colon cancer. I discussed she is eligible of genetic testing. She is interested -Consult scheduled 7/19  PLAN: -Labs, CT reviewed -Begin C1 FOLFIRINOX next week -Toxicity check in 2 weeks -F/u and cycle 2 in 3 weeks -Will send Rx for anti-emetics and EMLA -Genetics 7/19  All questions were answered. The patient knows to call the clinic with any problems, questions or concerns. No barriers to learning was detected.     Alla Feeling, NP 10/30/19   Addendum  I have seen the patient, examined her. I agree with the assessment and and plan and have edited the notes.   Pt is clinically stable, hyperbilirubinemia is improving.  We have gust her case and reviewed her scans in our GI tumor board this morning.  Giving the probable SMV invasion, and positive regional lymph nodes, Dr. Barry Dienes and I recommends neoadjuvant chemotherapy. She has borderline enlarged peritoneal lymph node, will monitor on future scans.  Given her young age and good performance status, I recommended neoadjuvant chemotherapy with FOLFIRINOX, and to start next week.  Benefit and potential side effects discussed with her and her husband in details.  She agrees to proceed.  She had a chemo class today.  We will see her back in 2 weeks for toxicity checkup  I spent a total of 40 minutes for her care today, including her care coordination.   Truitt Merle  10/30/2019

## 2019-10-30 ENCOUNTER — Other Ambulatory Visit: Payer: Self-pay

## 2019-10-30 ENCOUNTER — Inpatient Hospital Stay: Payer: BC Managed Care – PPO | Admitting: Nutrition

## 2019-10-30 ENCOUNTER — Inpatient Hospital Stay: Payer: BC Managed Care – PPO | Admitting: Nurse Practitioner

## 2019-10-30 ENCOUNTER — Inpatient Hospital Stay: Payer: BC Managed Care – PPO

## 2019-10-30 ENCOUNTER — Other Ambulatory Visit: Payer: Self-pay | Admitting: Hematology

## 2019-10-30 ENCOUNTER — Encounter: Payer: Self-pay | Admitting: Nurse Practitioner

## 2019-10-30 VITALS — BP 124/69 | HR 90 | Temp 98.7°F | Resp 17 | Ht 65.0 in | Wt 131.3 lb

## 2019-10-30 DIAGNOSIS — C250 Malignant neoplasm of head of pancreas: Secondary | ICD-10-CM

## 2019-10-30 DIAGNOSIS — K831 Obstruction of bile duct: Secondary | ICD-10-CM | POA: Diagnosis not present

## 2019-10-30 DIAGNOSIS — Z79899 Other long term (current) drug therapy: Secondary | ICD-10-CM | POA: Diagnosis not present

## 2019-10-30 DIAGNOSIS — L299 Pruritus, unspecified: Secondary | ICD-10-CM | POA: Diagnosis not present

## 2019-10-30 DIAGNOSIS — C257 Malignant neoplasm of other parts of pancreas: Secondary | ICD-10-CM | POA: Diagnosis not present

## 2019-10-30 DIAGNOSIS — E039 Hypothyroidism, unspecified: Secondary | ICD-10-CM | POA: Diagnosis not present

## 2019-10-30 DIAGNOSIS — Z801 Family history of malignant neoplasm of trachea, bronchus and lung: Secondary | ICD-10-CM | POA: Diagnosis not present

## 2019-10-30 DIAGNOSIS — Z780 Asymptomatic menopausal state: Secondary | ICD-10-CM | POA: Diagnosis not present

## 2019-10-30 DIAGNOSIS — Z800 Family history of malignant neoplasm of digestive organs: Secondary | ICD-10-CM | POA: Diagnosis not present

## 2019-10-30 DIAGNOSIS — R1010 Upper abdominal pain, unspecified: Secondary | ICD-10-CM | POA: Diagnosis not present

## 2019-10-30 LAB — CBC WITH DIFFERENTIAL (CANCER CENTER ONLY)
Abs Immature Granulocytes: 0.03 10*3/uL (ref 0.00–0.07)
Basophils Absolute: 0 10*3/uL (ref 0.0–0.1)
Basophils Relative: 1 %
Eosinophils Absolute: 0.1 10*3/uL (ref 0.0–0.5)
Eosinophils Relative: 1 %
HCT: 37 % (ref 36.0–46.0)
Hemoglobin: 11.9 g/dL — ABNORMAL LOW (ref 12.0–15.0)
Immature Granulocytes: 1 %
Lymphocytes Relative: 24 %
Lymphs Abs: 1.4 10*3/uL (ref 0.7–4.0)
MCH: 31 pg (ref 26.0–34.0)
MCHC: 32.2 g/dL (ref 30.0–36.0)
MCV: 96.4 fL (ref 80.0–100.0)
Monocytes Absolute: 0.9 10*3/uL (ref 0.1–1.0)
Monocytes Relative: 16 %
Neutro Abs: 3.4 10*3/uL (ref 1.7–7.7)
Neutrophils Relative %: 57 %
Platelet Count: 246 10*3/uL (ref 150–400)
RBC: 3.84 MIL/uL — ABNORMAL LOW (ref 3.87–5.11)
RDW: 19.6 % — ABNORMAL HIGH (ref 11.5–15.5)
WBC Count: 5.9 10*3/uL (ref 4.0–10.5)
nRBC: 0 % (ref 0.0–0.2)

## 2019-10-30 LAB — CMP (CANCER CENTER ONLY)
ALT: 65 U/L — ABNORMAL HIGH (ref 0–44)
AST: 53 U/L — ABNORMAL HIGH (ref 15–41)
Albumin: 3.1 g/dL — ABNORMAL LOW (ref 3.5–5.0)
Alkaline Phosphatase: 165 U/L — ABNORMAL HIGH (ref 38–126)
Anion gap: 7 (ref 5–15)
BUN: 8 mg/dL (ref 6–20)
CO2: 29 mmol/L (ref 22–32)
Calcium: 9.1 mg/dL (ref 8.9–10.3)
Chloride: 103 mmol/L (ref 98–111)
Creatinine: 0.69 mg/dL (ref 0.44–1.00)
GFR, Est AFR Am: >60 mL/min (ref >60)
GFR, Estimated: >60 mL/min (ref >60)
Glucose, Bld: 118 mg/dL — ABNORMAL HIGH (ref 70–99)
Potassium: 3.9 mmol/L (ref 3.5–5.1)
Sodium: 139 mmol/L (ref 135–145)
Total Bilirubin: 2.7 mg/dL — ABNORMAL HIGH (ref 0.3–1.2)
Total Protein: 6.7 g/dL (ref 6.5–8.1)

## 2019-10-30 MED ORDER — PROCHLORPERAZINE MALEATE 10 MG PO TABS: 10.0000 mg | ORAL_TABLET | Freq: Four times a day (QID) | ORAL | 1 refills | Status: DC | PRN

## 2019-10-30 MED ORDER — LIDOCAINE-PRILOCAINE 2.5-2.5 % EX CREA: TOPICAL_CREAM | CUTANEOUS | 3 refills | Status: DC

## 2019-10-30 MED ORDER — ONDANSETRON HCL 8 MG PO TABS: 8.0000 mg | ORAL_TABLET | Freq: Two times a day (BID) | ORAL | 1 refills | Status: DC | PRN

## 2019-10-30 NOTE — Progress Notes (Signed)
START ON PATHWAY REGIMEN - Pancreatic Adenocarcinoma     A cycle is every 14 days:     Oxaliplatin      Leucovorin      Irinotecan      Fluorouracil   **Always confirm dose/schedule in your pharmacy ordering system**  Patient Characteristics: Preoperative (Clinical Staging), Borderline Resectable, PS = 0,1 Therapeutic Status: Preoperative (Clinical Staging) AJCC T Category: cT2 AJCC N Category: cN1 Resectability Status: Borderline Resectable AJCC M Category: cM0 AJCC 8 Stage Grouping: IIB ECOG Performance Status: 1 Intent of Therapy: Curative Intent, Discussed with Patient

## 2019-10-30 NOTE — Patient Instructions (Signed)
[facility] °Discharge Instructions for Patients receiving Home Portable Chemo Pump  ° ° °**The bag should finish at 46 hours, 96 hours or 7 days. For example, if your pump is scheduled for 46 hours and it was put on at 4pm, it should finish at 2 pm the day it is scheduled to come off regardless of your appointment time.   ° °Estimated time to finish   _________________________ °(Have your nurse fill in)   °  °** if the display on your pump reads "Low Volume" and it is beeping, take the batteries out of the pump and come to the cancer center for it to be taken off.  ° °**If the pump alarms go off prior to the pump reading "Low Volume" then call the [phone] and someone can assist you. ° °**If the plunger comes out and the bag fluid is running out, please use your chemo spill kit to clean up the spill. Do not use paper towels or other house hold products. ° °** If you have problems or questions regarding your pump, please call either the [phone] or the cancer center Monday-Friday 8:00am-4:30pm at [phone] and we will assist you.  If you are unable to get assistance then go to [facility] Emergency Room, ask the staff to contact the IV team for assistance.   ° °

## 2019-10-30 NOTE — Progress Notes (Signed)
59 year old female diagnosed with pancreas cancer followed by Dr. Burr Medico.  Plan is for FOLFIRINOX and then Whipple procedure.  Past medical history includes hypothyroidism, diverticulosis, and macular hole.  Medications include Synthroid.  Labs include glucose 108 and albumin 2.8 on June 22.  Height: 5 feet 5 inches. Weight: 131 pounds. Usual body weight: 145 pounds in October 2020. BMI: 21.80.  Patient reports she has been vegetarian but is willing to add meat back into her diet if needed.  She mainly prepares most of her food at home and protein is focused on dried beans and peas.  She also enjoys dairy products.  She has been consuming 1 high-protein oral nutrition supplement a day. Patient has lost approximately 10% of her body weight over 8 months which is concerning. She is here for nutrition education prior to beginning treatment.  Nutrition diagnosis:  Food and nutrition related knowledge deficit related to new diagnosis of cancer as evidenced by no prior need for nutrition related information.  Intervention: Educated patient to continue healthy plant-based diet and reviewed plant-based proteins. Encourage patient to consume adequate calories to minimize weight loss. Encourage patient to continue drinking Premier protein or similar oral nutrition supplement which provides approximately 30 g of protein. Provided nutrition fact sheets along with my contact information.  Questions were answered.  Teach back method used.  Monitoring, evaluation, goals: Patient will tolerate adequate calories and protein to minimize weight loss throughout treatment.  Next visit: To be scheduled with upcoming treatment.  **Disclaimer: This note was dictated with voice recognition software. Similar sounding words can inadvertently be transcribed and this note may contain transcription errors which may not have been corrected upon publication of note.**

## 2019-10-31 ENCOUNTER — Telehealth: Payer: Self-pay | Admitting: Nurse Practitioner

## 2019-10-31 LAB — CANCER ANTIGEN 19-9: CA 19-9: 688 U/mL — ABNORMAL HIGH (ref 0–35)

## 2019-10-31 NOTE — Telephone Encounter (Signed)
Scheduled per 6/30 los. Pt is aware of appt times and date. Noted to give pt updated calendar.

## 2019-11-06 NOTE — Progress Notes (Signed)
.   Pharmacist Chemotherapy Monitoring - Initial Assessment    Anticipated start date: 11/09/19   Regimen:  . Are orders appropriate based on the patient's diagnosis, regimen, and cycle? Yes . Does the plan date match the patient's scheduled date? Yes . Is the sequencing of drugs appropriate? Yes . Are the premedications appropriate for the patient's regimen? Yes . Prior Authorization for treatment is: Approved o If applicable, is the correct biosimilar selected based on the patient's insurance? not applicable  Organ Function and Labs: Marland Kitchen Are dose adjustments needed based on the patient's renal function, hepatic function, or hematologic function? No . Are appropriate labs ordered prior to the start of patient's treatment? Yes . Other organ system assessment, if indicated: N/A . The following baseline labs, if indicated, have been ordered: N/A  Dose Assessment: . Are the drug doses appropriate? Yes . Are the following correct: o Drug concentrations Yes o IV fluid compatible with drug Yes o Administration routes Yes o Timing of therapy Yes . If applicable, does the patient have documented access for treatment and/or plans for port-a-cath placement? yes . If applicable, have lifetime cumulative doses been properly documented and assessed? not applicable Lifetime Dose Tracking  No doses have been documented on this patient for the following tracked chemicals: Doxorubicin, Epirubicin, Idarubicin, Daunorubicin, Mitoxantrone, Bleomycin, Oxaliplatin, Carboplatin, Liposomal Doxorubicin  o   Toxicity Monitoring/Prevention: . The patient has the following take home antiemetics prescribed: Prochlorperazine . The patient has the following take home medications prescribed: N/A . Medication allergies and previous infusion related reactions, if applicable, have been reviewed and addressed. Yes . The patient's current medication list has been assessed for drug-drug interactions with their  chemotherapy regimen. no significant drug-drug interactions were identified on review.  Order Review: . Are the treatment plan orders signed? Yes . Is the patient scheduled to see a provider prior to their treatment? No  I verify that I have reviewed each item in the above checklist and answered each question accordingly.  Breanna Noble 11/06/2019 10:12 AM

## 2019-11-08 ENCOUNTER — Inpatient Hospital Stay: Payer: BC Managed Care – PPO

## 2019-11-08 ENCOUNTER — Inpatient Hospital Stay: Payer: BC Managed Care – PPO | Attending: Hematology

## 2019-11-08 ENCOUNTER — Other Ambulatory Visit: Payer: Self-pay

## 2019-11-08 DIAGNOSIS — C257 Malignant neoplasm of other parts of pancreas: Secondary | ICD-10-CM | POA: Diagnosis not present

## 2019-11-08 DIAGNOSIS — Z452 Encounter for adjustment and management of vascular access device: Secondary | ICD-10-CM | POA: Diagnosis not present

## 2019-11-08 DIAGNOSIS — K831 Obstruction of bile duct: Secondary | ICD-10-CM | POA: Diagnosis not present

## 2019-11-08 DIAGNOSIS — Z5111 Encounter for antineoplastic chemotherapy: Secondary | ICD-10-CM | POA: Diagnosis not present

## 2019-11-08 DIAGNOSIS — Z5189 Encounter for other specified aftercare: Secondary | ICD-10-CM | POA: Diagnosis not present

## 2019-11-08 DIAGNOSIS — Z95828 Presence of other vascular implants and grafts: Secondary | ICD-10-CM

## 2019-11-08 DIAGNOSIS — R1010 Upper abdominal pain, unspecified: Secondary | ICD-10-CM | POA: Insufficient documentation

## 2019-11-08 DIAGNOSIS — C250 Malignant neoplasm of head of pancreas: Secondary | ICD-10-CM

## 2019-11-08 LAB — CMP (CANCER CENTER ONLY)
ALT: 62 U/L — ABNORMAL HIGH (ref 0–44)
AST: 54 U/L — ABNORMAL HIGH (ref 15–41)
Albumin: 3.2 g/dL — ABNORMAL LOW (ref 3.5–5.0)
Alkaline Phosphatase: 128 U/L — ABNORMAL HIGH (ref 38–126)
Anion gap: 9 (ref 5–15)
BUN: 10 mg/dL (ref 6–20)
CO2: 26 mmol/L (ref 22–32)
Calcium: 9.3 mg/dL (ref 8.9–10.3)
Chloride: 105 mmol/L (ref 98–111)
Creatinine: 0.64 mg/dL (ref 0.44–1.00)
GFR, Est AFR Am: >60 mL/min (ref >60)
GFR, Estimated: >60 mL/min (ref >60)
Glucose, Bld: 105 mg/dL — ABNORMAL HIGH (ref 70–99)
Potassium: 4.1 mmol/L (ref 3.5–5.1)
Sodium: 140 mmol/L (ref 135–145)
Total Bilirubin: 1.7 mg/dL — ABNORMAL HIGH (ref 0.3–1.2)
Total Protein: 6.5 g/dL (ref 6.5–8.1)

## 2019-11-08 LAB — CBC WITH DIFFERENTIAL (CANCER CENTER ONLY)
Abs Immature Granulocytes: 0.03 10*3/uL (ref 0.00–0.07)
Basophils Absolute: 0 10*3/uL (ref 0.0–0.1)
Basophils Relative: 1 %
Eosinophils Absolute: 0.1 10*3/uL (ref 0.0–0.5)
Eosinophils Relative: 3 %
HCT: 35.9 % — ABNORMAL LOW (ref 36.0–46.0)
Hemoglobin: 11.5 g/dL — ABNORMAL LOW (ref 12.0–15.0)
Immature Granulocytes: 1 %
Lymphocytes Relative: 34 %
Lymphs Abs: 1.7 10*3/uL (ref 0.7–4.0)
MCH: 30.3 pg (ref 26.0–34.0)
MCHC: 32 g/dL (ref 30.0–36.0)
MCV: 94.5 fL (ref 80.0–100.0)
Monocytes Absolute: 0.8 10*3/uL (ref 0.1–1.0)
Monocytes Relative: 16 %
Neutro Abs: 2.4 10*3/uL (ref 1.7–7.7)
Neutrophils Relative %: 45 %
Platelet Count: 250 10*3/uL (ref 150–400)
RBC: 3.8 MIL/uL — ABNORMAL LOW (ref 3.87–5.11)
RDW: 17.8 % — ABNORMAL HIGH (ref 11.5–15.5)
WBC Count: 5.2 10*3/uL (ref 4.0–10.5)
nRBC: 0 % (ref 0.0–0.2)

## 2019-11-08 MED ORDER — HEPARIN SOD (PORK) LOCK FLUSH 100 UNIT/ML IV SOLN
500.0000 [IU] | Freq: Once | INTRAVENOUS | Status: AC
  Administered 2019-11-08: 500 [IU] via INTRAVENOUS
  Filled 2019-11-08: qty 5

## 2019-11-08 MED ORDER — SODIUM CHLORIDE 0.9% FLUSH
10.0000 mL | INTRAVENOUS | Status: DC | PRN
  Administered 2019-11-08: 10 mL via INTRAVENOUS
  Filled 2019-11-08: qty 10

## 2019-11-08 NOTE — Patient Instructions (Signed)

## 2019-11-08 NOTE — Progress Notes (Signed)
OK to treat with bilirubin today 1.7 per MD Burr Medico. Patient will receive chemo on 11/09/19.

## 2019-11-09 ENCOUNTER — Inpatient Hospital Stay: Payer: BC Managed Care – PPO

## 2019-11-09 ENCOUNTER — Other Ambulatory Visit: Payer: Self-pay | Admitting: Hematology

## 2019-11-09 VITALS — BP 112/60 | HR 84 | Temp 98.6°F | Resp 18

## 2019-11-09 DIAGNOSIS — C250 Malignant neoplasm of head of pancreas: Secondary | ICD-10-CM

## 2019-11-09 DIAGNOSIS — R1010 Upper abdominal pain, unspecified: Secondary | ICD-10-CM | POA: Diagnosis not present

## 2019-11-09 DIAGNOSIS — K831 Obstruction of bile duct: Secondary | ICD-10-CM | POA: Diagnosis not present

## 2019-11-09 DIAGNOSIS — Z5111 Encounter for antineoplastic chemotherapy: Secondary | ICD-10-CM | POA: Diagnosis not present

## 2019-11-09 DIAGNOSIS — C257 Malignant neoplasm of other parts of pancreas: Secondary | ICD-10-CM | POA: Diagnosis not present

## 2019-11-09 DIAGNOSIS — Z5189 Encounter for other specified aftercare: Secondary | ICD-10-CM | POA: Diagnosis not present

## 2019-11-09 DIAGNOSIS — Z452 Encounter for adjustment and management of vascular access device: Secondary | ICD-10-CM | POA: Diagnosis not present

## 2019-11-09 LAB — CANCER ANTIGEN 19-9: CA 19-9: 578 U/mL — ABNORMAL HIGH (ref 0–35)

## 2019-11-09 MED ORDER — SODIUM CHLORIDE 0.9 % IV SOLN
2400.0000 mg/m2 | INTRAVENOUS | Status: DC
  Administered 2019-11-09: 3950 mg via INTRAVENOUS
  Filled 2019-11-09: qty 79

## 2019-11-09 MED ORDER — SODIUM CHLORIDE 0.9 % IV SOLN
400.0000 mg/m2 | Freq: Once | INTRAVENOUS | Status: AC
  Administered 2019-11-09: 660 mg via INTRAVENOUS
  Filled 2019-11-09: qty 33

## 2019-11-09 MED ORDER — SODIUM CHLORIDE 0.9 % IV SOLN
150.0000 mg/m2 | Freq: Once | INTRAVENOUS | Status: AC
  Administered 2019-11-09: 240 mg via INTRAVENOUS
  Filled 2019-11-09: qty 12

## 2019-11-09 MED ORDER — PALONOSETRON HCL INJECTION 0.25 MG/5ML
0.2500 mg | Freq: Once | INTRAVENOUS | Status: AC
  Administered 2019-11-09: 0.25 mg via INTRAVENOUS

## 2019-11-09 MED ORDER — SODIUM CHLORIDE 0.9 % IV SOLN
150.0000 mg | Freq: Once | INTRAVENOUS | Status: AC
  Administered 2019-11-09: 150 mg via INTRAVENOUS
  Filled 2019-11-09: qty 150

## 2019-11-09 MED ORDER — DEXTROSE 5 % IV SOLN
Freq: Once | INTRAVENOUS | Status: AC
  Filled 2019-11-09: qty 250

## 2019-11-09 MED ORDER — ATROPINE SULFATE 1 MG/ML IJ SOLN
INTRAMUSCULAR | Status: AC
  Filled 2019-11-09: qty 1

## 2019-11-09 MED ORDER — SODIUM CHLORIDE 0.9% FLUSH
10.0000 mL | INTRAVENOUS | Status: DC | PRN
  Filled 2019-11-09: qty 10

## 2019-11-09 MED ORDER — OXALIPLATIN CHEMO INJECTION 100 MG/20ML
85.0000 mg/m2 | Freq: Once | INTRAVENOUS | Status: AC
  Administered 2019-11-09: 140 mg via INTRAVENOUS
  Filled 2019-11-09: qty 20

## 2019-11-09 MED ORDER — PALONOSETRON HCL INJECTION 0.25 MG/5ML
INTRAVENOUS | Status: AC
  Filled 2019-11-09: qty 5

## 2019-11-09 MED ORDER — SODIUM CHLORIDE 0.9 % IV SOLN
10.0000 mg | Freq: Once | INTRAVENOUS | Status: AC
  Administered 2019-11-09: 10 mg via INTRAVENOUS
  Filled 2019-11-09: qty 10

## 2019-11-09 NOTE — Patient Instructions (Signed)
[facility] Discharge Instructions for Patients receiving Home Portable Chemo Pump    **The bag should finish at 46 hours, 96 hours or 7 days. For example, if your pump is scheduled for 46 hours and it was put on at 4pm, it should finish at 2 pm the day it is scheduled to come off regardless of your appointment time.    Estimated time to finish   _________________________ (Have your nurse fill in)     ** if the display on your pump reads "Low Volume" and it is beeping, take the batteries out of the pump and come to the cancer center for it to be taken off.   **If the pump alarms go off prior to the pump reading "Low Volume" then call the [phone] and someone can assist you.  **If the plunger comes out and the bag fluid is running out, please use your chemo spill kit to clean up the spill. Do not use paper towels or other house hold products.  ** If you have problems or questions regarding your pump, please call either the [phone] or the cancer center Monday-Friday 8:00am-4:30pm at [phone] and we will assist you.  If you are unable to get assistance then go to [facility] Emergency Room, ask the staff to contact the IV team for assistance.   Chemotherapy Chemotherapy is a cancer treatment. It uses medicines to slow down or stop the growth of cancer. You may have chemotherapy to:  Cure your cancer.  Prevent the cancer from growing or spreading (metastasizing).  Ease symptoms and improve your quality of life (palliative care).  Improve the effects of radiation treatment.  Shrink a tumor before surgery.  Rid the body of cancer cells that remain after having a tumor surgically removed. The length of chemotherapy treatment depends on many factors, including:  The type and stage of your cancer.  How you respond to the chemotherapy.  Your side effects. What are the risks? Generally, this is a safe treatment. However, problems may occur,  including:  Infection.  Bleeding at the IV site.  Allergic reactions to medicines. You may have side effects from chemotherapy. What side effects you have depends on a variety of factors, including:  The type of chemotherapy medicine used.  Your dosage.  How long the medicine is used for.  Your overall health. What happens before treatment?  You will meet with your cancer care team to discuss: ? How your chemotherapy medicine will be given. ? Common side effects and how to manage them. ? Your treatment schedule.  You may have blood tests.  You may be given medicine to help prevent common side effects. What happens during treatment? Chemotherapy may be given continuously over time, or it may be given in cycles. Some common ways chemotherapy may be given include:  As a pill or capsule.  As an injection.  As a skin (topical) cream.  As a special wafer that is put in your body where the cancer is.  As an injection into the cerebrospinal fluid (CSF) in the brain or spinal cord (intraventricular or intrathecal chemotherapy).  Through a small, thin tube (catheter). There are different kinds of catheters. You might have one that: ? Goes into a vein (intravenous catheter). ? Connects to a device (port) that is inserted under the skin of your chest (port catheter). A port catheter connects the port to a large vein in your chest or upper arm. The port may stay in place for  many weeks or months. ? Goes into a vein near your elbow (PICC line). This may be used for weeks or months. ? Goes into a vein in your neck that leads to your heart (non-tunneled catheter). This catheter has a risk of infection, so it is used for only a short time. ? Goes through the skin of your chest and into a large vein that leads to your heart (tunneled catheter). This catheter can stay in your body for months or years. While you are receiving your medicine, your cancer care team will monitor your blood  pressure, heart rate, breathing rate, and blood oxygen level (vital signs) and watch for any problems. Some types of chemotherapy medicine are given only one time. Others are given for months, years, or for life. What can I expect after treatment? After chemotherapy, you may have side effects, such as:  Nausea and vomiting.  Appetite loss.  Constipation or diarrhea.  Fatigue.  Increased risk of infections, bruising, or bleeding.  Hair loss.  Mouth or throat sores.  Tingling, pain, or numbness in the hands and feet.  Dry, sensitive, itchy, or sore skin.  Memory changes. Follow these instructions at home: General instructions   If you get chemotherapy through an IV, PICC line, or port, check the site every day for signs of infection. Check for redness, swelling, pain, fluid, or warmth.  Wash your hands frequently with soap and water. If soap and water are not available, use hand sanitizer. Have other members of your household wash their hands often.  Chemotherapy medicines leave the body through urine or stool (feces), but they can also be present in other body fluids including vomit, tears, vaginal fluids, and semen. You must carefully follow some safety precautions to prevent harm to others while you are taking these medicines: ? Wash any clothes, towels, and linens that may have your bodily fluids on them twice in a washing machine. ? Use a condom during vaginal, anal, and oral sex while you are taking chemotherapy medicines and for 48 hours after your last dose. ? Practice good bathroom hygiene:  Always sit when using the toilet. Close the toilet seat lid before you flush.  Wash your hands thoroughly with soap and water after each time you use the toilet.  Keep all follow-up visits as told by your cancer care team. This is important. Eating and drinking  Talk with a dietitian about what you should eat and drink during cancer treatment.  Always wash fresh fruits and  vegetables well before eating them.  Drink enough fluid to keep urine pale yellow. Medicines  Take over-the-counter and prescription medicines only as told by your health care provider.  Talk with your health care provider before taking vitamins and supplements. Some can interfere with chemotherapy. Activity  Get plenty of rest.  Get regular exercise such as walking, gentle yoga, or tai chi.  Return to your normal activities as told by your health care provider. Ask your health care provider what activities are safe for you. Contact a health care provider if:  You have: ? A skin rash that does not go away. ? A headache. ? A stiff neck. ? A cough. ? Cold or flu symptoms. ? A burning feeling when urinating. ? Urine that smells bad. ? Diarrhea. ? Nausea. ? Vomiting. ? Blood in your urine or stool.  You bleed or bruise often.  You urinate more frequently than usual.  You cannot eat because of mouth or throat pain. Get help  right away if:  You have: ? A fever. ? Redness, swelling, pain, fluid, or warmth near your IV site. ? Bleeding that does not stop. ? A seizure. ? Chest pain. ? Difficulty breathing.  You cannot swallow. Summary  Chemotherapy is a way to treat cancer. It uses medicines to slow down or stop the growth of cancer.  Before treatment, you and your cancer care team will discuss common side effects and how to manage them.  The way that you will get chemotherapy medicines depends on your condition.  Take over-the-counter and prescription medicines only as told by your health care provider. This information is not intended to replace advice given to you by your health care provider. Make sure you discuss any questions you have with your health care provider. Document Revised: 08/08/2018 Document Reviewed: 02/02/2017 Elsevier Patient Education  [place].

## 2019-11-11 ENCOUNTER — Telehealth: Payer: Self-pay | Admitting: *Deleted

## 2019-11-11 ENCOUNTER — Inpatient Hospital Stay: Payer: BC Managed Care – PPO

## 2019-11-11 ENCOUNTER — Other Ambulatory Visit: Payer: Self-pay

## 2019-11-11 VITALS — BP 122/56 | HR 73 | Temp 98.3°F | Resp 18

## 2019-11-11 DIAGNOSIS — K831 Obstruction of bile duct: Secondary | ICD-10-CM | POA: Diagnosis not present

## 2019-11-11 DIAGNOSIS — C250 Malignant neoplasm of head of pancreas: Secondary | ICD-10-CM

## 2019-11-11 DIAGNOSIS — Z452 Encounter for adjustment and management of vascular access device: Secondary | ICD-10-CM | POA: Diagnosis not present

## 2019-11-11 DIAGNOSIS — R1010 Upper abdominal pain, unspecified: Secondary | ICD-10-CM | POA: Diagnosis not present

## 2019-11-11 DIAGNOSIS — Z5111 Encounter for antineoplastic chemotherapy: Secondary | ICD-10-CM | POA: Diagnosis not present

## 2019-11-11 DIAGNOSIS — C257 Malignant neoplasm of other parts of pancreas: Secondary | ICD-10-CM | POA: Diagnosis not present

## 2019-11-11 DIAGNOSIS — Z5189 Encounter for other specified aftercare: Secondary | ICD-10-CM | POA: Diagnosis not present

## 2019-11-11 MED ORDER — SODIUM CHLORIDE 0.9% FLUSH
10.0000 mL | INTRAVENOUS | Status: DC | PRN
  Administered 2019-11-11: 10 mL
  Filled 2019-11-11: qty 10

## 2019-11-11 MED ORDER — PEGFILGRASTIM-CBQV 6 MG/0.6ML ~~LOC~~ SOSY
6.0000 mg | PREFILLED_SYRINGE | Freq: Once | SUBCUTANEOUS | Status: AC
  Administered 2019-11-11: 6 mg via SUBCUTANEOUS

## 2019-11-11 MED ORDER — PEGFILGRASTIM-CBQV 6 MG/0.6ML ~~LOC~~ SOSY
PREFILLED_SYRINGE | SUBCUTANEOUS | Status: AC
  Filled 2019-11-11: qty 0.6

## 2019-11-11 MED ORDER — HEPARIN SOD (PORK) LOCK FLUSH 100 UNIT/ML IV SOLN
500.0000 [IU] | Freq: Once | INTRAVENOUS | Status: AC | PRN
  Administered 2019-11-11: 500 [IU]
  Filled 2019-11-11: qty 5

## 2019-11-12 ENCOUNTER — Telehealth: Payer: Self-pay | Admitting: Nurse Practitioner

## 2019-11-12 NOTE — Progress Notes (Signed)
[facility]   [phone] [phone]   Clinic Follow up Note   Patient Care Team: Ileana Ladd, MD as PCP - General (Family Medicine) Fontaine, Nadyne Coombes, MD (Inactive) as Consulting Physician (Gynecology) Malachy Mood, MD as Consulting Physician (Oncology) Radonna Ricker, RN as Registered Nurse Willis Modena, MD as Consulting Physician (Gastroenterology) Almond Lint, MD as Consulting Physician (General Surgery) 11/13/2019  I connected with Breanna Noble on 11/13/19 at 9:25 AM EDT by telephone visit and verified that I am speaking with the correct person using two identifiers.   I discussed the limitations, risks, security and privacy concerns of performing an evaluation and management service by telemedicine and the availability of in-person appointments. I also discussed with the patient that there may be a patient responsible charge related to this service. The patient expressed understanding and agreed to proceed.   Other persons participating in the visit and their role in the encounter: grandchildren at home but not participating in the call  Patient's location: Home Provider's location: [facility] office   CHIEF COMPLAINT: Follow-up pancreas cancer, toxicity check   SUMMARY OF ONCOLOGIC HISTORY: Oncology History Overview Note  Cancer Staging Pancreatic cancer [facility]) Staging form: Exocrine Pancreas, AJCC 8th Edition - Clinical stage from 10/16/2019: Stage IIB (cT2, cN1, cM0) - Signed by Malachy Mood, MD on 10/16/2019    Pancreatic cancer (HCC)  10/03/2019 Imaging   US Abdomen 10/03/19  IMPRESSION: Markedly dilated bile ducts. Dilated gallbladder with sludge but no gallstones.   2.9 cm mass in the uncinate process most likely pancreatic neoplasm causing biliary obstruction. Recommend MRI of the pancreas without with contrast for further evaluation. If the patient cannot have MRI, CT of the pancreas with contrast recommended.   10/07/2019  Imaging   MRI Abdomen 10/07/19  IMPRESSION: 1. There is a hypoenhancing mass of the pancreatic uncinate with abrupt obstruction of the central common bile duct, measuring approximately 2.9 cm, consistent with pancreatic adenocarcinoma. 2. Gross intra and extrahepatic biliary ductal dilatation, common bile duct measuring 1.4 cm. Gallbladder hydrops. 3. The pancreatic duct is nondilated. 4. Pancreatic mass lies closely adjacent to the most central portions of the superior mesenteric vein and central portal vein, due to motion artifact it is difficult to clearly discern whether there is a preserved fat plane. Multiphasic contrast enhanced pancreatic protocol CT may be less motion sensitive and better delineate vascular structures for the purposes of surgical planning if necessary. 5. No evidence of lymphadenopathy or metastatic disease in the abdomen.   10/10/2019 Procedure   EUS by Dr Dulce Sellar 10/10/19  IMPRESSION - A mass was identified at junction of head/uncinate process of the pancreas. This was staged T2 N1 Mx by endosonographic criteria. Fine needle aspiration performed. - A few abnormal lymph nodes were visualized in the peripancreatic region. - There was dilation in the common bile duct which measured up to 14 mm. - Hyperechoic material consistent with sludge was visualized endosonographically in the gallbladder.   10/10/2019 Procedure   ERCP by Dr Dulce Sellar 10/10/19  IMPRESSION - The major papilla appeared normal. - One temporary stent was placed into the ventral pancreatic duct. - A biliary sphincterotomy was performed. - One covered metal stent was placed into the common bile duct.   10/10/2019 Initial Biopsy   FINAL MICROSCOPIC DIAGNOSIS: 10/10/19  A. PANCREAS, HEAD, FINE NEEDLE ASPIRATION:  - Malignant cells present, consistent with adenocarcinoma    10/16/2019 Initial Diagnosis   Pancreatic cancer (HCC)   10/16/2019 Cancer Staging  Staging form: Exocrine Pancreas, AJCC 8th  Edition - Clinical stage from 10/16/2019: Stage IIB (cT2, cN1, cM0) - Signed by Truitt Merle, MD on 10/16/2019   11/09/2019 -  Chemotherapy   The patient had dexamethasone (DECADRON) 4 MG tablet, 8 mg, Oral, Daily, 1 of 1 cycle, Start date: --, End date: -- palonosetron (ALOXI) injection 0.25 mg, 0.25 mg, Intravenous,  Once, 1 of 4 cycles Administration: 0.25 mg (11/09/2019) pegfilgrastim-cbqv (UDENYCA) injection 6 mg, 6 mg, Subcutaneous, Once, 1 of 4 cycles Administration: 6 mg (11/11/2019) irinotecan (CAMPTOSAR) 240 mg in sodium chloride 0.9 % 500 mL chemo infusion, 150 mg/m2 = 240 mg, Intravenous,  Once, 1 of 4 cycles Administration: 240 mg (11/09/2019) oxaliplatin (ELOXATIN) 140 mg in dextrose 5 % 500 mL chemo infusion, 85 mg/m2 = 140 mg, Intravenous,  Once, 1 of 4 cycles Administration: 140 mg (11/09/2019) fosaprepitant (EMEND) 150 mg in sodium chloride 0.9 % 145 mL IVPB, 150 mg, Intravenous,  Once, 1 of 4 cycles Administration: 150 mg (11/09/2019) fluorouracil (ADRUCIL) 3,950 mg in sodium chloride 0.9 % 71 mL chemo infusion, 2,400 mg/m2 = 3,950 mg, Intravenous, 1 Day/Dose, 1 of 4 cycles Administration: 3,950 mg (11/09/2019) leucovorin 660 mg in sodium chloride 0.9 % 250 mL infusion, 400 mg/m2 = 660 mg, Intravenous,  Once, 1 of 4 cycles Administration: 660 mg (11/09/2019)  for chemotherapy treatment.      CURRENT THERAPY: Neoadjuvant FOLFIRINOX with GCSF starting 11/09/19   INTERVAL HISTORY: Breanna Noble presents for phone visit toxicity check as scheduled.  She began cycle 1 on 7/10, pump DC and injection on 7/12.  On day 4 she had mild low appetite and mild back and stomach pain from the injection.  Claritin is helping.  She has mild nausea without vomiting, and a "touch of diarrhea this morning."  She has not started Imodium.  She is able to eat and drink normally.  Urine is clear.  Her energy is pretty good, able to carry on normal function.  She had mild cold sensitivity on day 4, no residual  neuropathy.  Itching has mostly resolved.  She has a mild dry cough since her EUS which is stable.  Otherwise she denies mucositis, fever, chills, chest pain, dyspnea.     MEDICAL HISTORY:  Past Medical History:  Diagnosis Date  . Cataracts, bilateral   . Diverticulosis 12/07/2001   found on colonoscopy  . Hypothyroid 10/2007  . Macular hole   . Pancreatic mass   . PONV (postoperative nausea and vomiting)     SURGICAL HISTORY: Past Surgical History:  Procedure Laterality Date  . BILIARY STENT PLACEMENT  10/10/2019   Procedure: BILIARY STENT PLACEMENT;  Surgeon: Arta Silence, MD;  Location: [place];  Service: Endoscopy;;  . CATARACT EXTRACTION, BILATERAL    . COLONOSCOPY    . ERCP N/A 10/10/2019   Procedure: ENDOSCOPIC RETROGRADE CHOLANGIOPANCREATOGRAPHY (ERCP);  Surgeon: Arta Silence, MD;  Location: [facility] ENDOSCOPY;  Service: Endoscopy;  Laterality: N/A;  . ESOPHAGOGASTRODUODENOSCOPY N/A 10/10/2019   Procedure: ESOPHAGOGASTRODUODENOSCOPY (EGD);  Surgeon: Arta Silence, MD;  Location: [facility] ENDOSCOPY;  Service: Endoscopy;  Laterality: N/A;  . EUS N/A 10/10/2019   Procedure: UPPER ENDOSCOPIC ULTRASOUND (EUS) LINEAR;  Surgeon: Arta Silence, MD;  Location: [place];  Service: Endoscopy;  Laterality: N/A;  . FINE NEEDLE ASPIRATION  10/10/2019   Procedure: FINE NEEDLE ASPIRATION (FNA) LINEAR;  Surgeon: Arta Silence, MD;  Location: [place];  Service: Endoscopy;;  . IR IMAGING GUIDED PORT INSERTION  10/25/2019  . Macular hole surg    .  PANCREATIC STENT PLACEMENT  10/10/2019   Procedure: PANCREATIC STENT PLACEMENT;  Surgeon: Arta Silence, MD;  Location: [facility] ENDOSCOPY;  Service: Endoscopy;;  . SPHINCTEROTOMY  10/10/2019   Procedure: SPHINCTEROTOMY;  Surgeon: Arta Silence, MD;  Location: [facility] ENDOSCOPY;  Service: Endoscopy;;  . TONSILLECTOMY      I have reviewed the social history and family history with the patient and they are unchanged from previous  note.  ALLERGIES:  has No Known Allergies.  MEDICATIONS:  Current Outpatient Medications  Medication Sig Dispense Refill  . Ascorbic Acid (VITAMIN C PO) Take 1,000 mg by mouth daily.     . B Complex Vitamins (B-COMPLEX/B-12 PO) Take by mouth.    . Calcium Carb-Cholecalciferol (CALCIUM 1000 + D PO) Take by mouth.    . diphenhydrAMINE (BENADRYL) 25 MG tablet Take 25 mg by mouth at bedtime.    . ferrous sulfate 325 (65 FE) MG tablet Take 325 mg by mouth daily with breakfast.    . hydrOXYzine (ATARAX/VISTARIL) 25 MG tablet Take 1 tablet (25 mg total) by mouth 3 (three) times daily as needed for itching. 60 tablet 1  . levothyroxine (SYNTHROID, LEVOTHROID) 50 MCG tablet Take 1 tablet (50 mcg total) by mouth daily. 90 tablet 4  . lidocaine-prilocaine (EMLA) cream Apply to affected area once 30 g 3  . Multiple Vitamin (MULTIVITAMIN PO) Take by mouth.      . ondansetron (ZOFRAN) 8 MG tablet Take 1 tablet (8 mg total) by mouth 2 (two) times daily as needed. Start on day 3 after chemotherapy. 30 tablet 1  . prochlorperazine (COMPAZINE) 10 MG tablet Take 1 tablet (10 mg total) by mouth every 6 (six) hours as needed (Nausea or vomiting). 30 tablet 1  . mupirocin ointment (BACTROBAN) 2 % Place 1 application into the nose daily as needed (sore in nose).     No current facility-administered medications for this visit.    PHYSICAL EXAMINATION: ECOG PERFORMANCE STATUS: 1 - Symptomatic but completely ambulatory  There were no vitals filed for this visit. There were no vitals filed for this visit.  Patient appears well over the phone.  Speech is clear and intact.  No cough or conversational dyspnea.  Mood and affect appear appropriate for situation.   LABORATORY DATA:  No labs for this visit   RADIOGRAPHIC STUDIES: I have personally reviewed the radiological images as listed and agreed with the findings in the report. No results found.   ASSESSMENT & PLAN: Breanna Stammer Brimis a 59  y.o.caucasianfemalewith a history of Cataracts, Diverticulosis, Hypothyroidism  1. Pancreatic Cancer, Stage IIB, T2N1M0 -Her US showed bile duct and gallbladder dilation due to 2-3cm mass at head of pancreas/Uncinate. Her MRI showed her mass is laying close to SMV but not involving it. There is no evidence of lymphadenopathy or metastatic disease in the abdomenon MRI. -She underwent EUS and ERCP on 10/10/19 and had 2 stents placed by Dr Paulita Fujita. Herpancreatic mass biopsyshowed Adenocarcinoma of Pancreas.Endoscopic staging T2N1, noevidence of vascular involvement. -She was seen by Dr. Barry Dienes who felt she is resectable after a course of neoadjuvant chemo -She underwent CT CAP with pancreatic protocol showing the pancreatic mass with local adenopathy and borderline potential adenopathy in the periaortic region which will be followed on restaging scans.  There is no definite vascular involvement or distant metastasis. We reviewed this with the patient and her husband today -Her case was reviewed in GI tumor conference on 10/30/19 -Again the plan is for neoadjuvant chemo followed by restaging scans  and Whipple surgery -Given her young age and good performance status Dr. Burr Medico recommends intensive neoadjuvant chemo with FOLFIRINOX q2 weeks for 8 cycles  -began FOLFIRINOX with GCSF on 11/09/19   2. Obstructive Jaundice  -She had 3 weeks of Jaundice of eyes, skin and urine. She underwent ERCP with Dr Paulita Fujita for CBD and temporaryventral pancreatic ductstents placement on 10/10/19.  -Her case was reviewed in tumor board, stent is in good position -LFTs and hyperbilirubinemia continue to improve, clinically, her juandice resolved  -We will monitor on chemo  3. Upper Abdominal Pain, Mild weight loss  -Presented with initial jaundice but since stent placement her pain has much improved.  -Pain resolved -Weight is stable, she met with our dietitian.  Continue supplements  4. Genetic Testing -She  has maternal family history of pancreatic and colon cancer. I discussed she is eligible of genetic testing. She is interested -Consult scheduled 7/19  Disposition:  Breanna Noble appears stable.  She is s/p FOLFIRINOX C1 day 5, she is tolerating treatment well so far with mild decreased appetite, nausea, diarrhea and cold sensitivity.  Symptoms are adequately managed with supportive care at home.  I recommend for her to begin Imodium for early diarrhea and alternate Zofran and Compazine for nausea.  She appears able to remain adequately nourished and hydrated.  She remains functional and has been able to recover well.  We reviewed her upcoming appointments, genetics and labs on 7/19.  She knows to let us know if she is having issues that day and she can be seen for symptom management. She knows to call if her symptoms are no longer managed well at home. Otherwise she will return for follow-up and cycle 2 on 7/26.  All questions were answered. The patient knows to call the clinic with any problems, questions or concerns. No barriers to learning were detected. Total of 10 minutes spent on today's call.     Alla Feeling, NP 11/13/19

## 2019-11-13 ENCOUNTER — Inpatient Hospital Stay (HOSPITAL_BASED_OUTPATIENT_CLINIC_OR_DEPARTMENT_OTHER): Payer: BC Managed Care – PPO | Admitting: Nurse Practitioner

## 2019-11-13 ENCOUNTER — Encounter: Payer: Self-pay | Admitting: Nurse Practitioner

## 2019-11-13 DIAGNOSIS — C250 Malignant neoplasm of head of pancreas: Secondary | ICD-10-CM | POA: Diagnosis not present

## 2019-11-14 ENCOUNTER — Telehealth: Payer: Self-pay | Admitting: Nurse Practitioner

## 2019-11-14 NOTE — Telephone Encounter (Signed)
No 7/13 los 

## 2019-11-18 ENCOUNTER — Inpatient Hospital Stay: Payer: BC Managed Care – PPO

## 2019-11-18 ENCOUNTER — Other Ambulatory Visit: Payer: Self-pay

## 2019-11-18 ENCOUNTER — Inpatient Hospital Stay (HOSPITAL_BASED_OUTPATIENT_CLINIC_OR_DEPARTMENT_OTHER): Payer: BC Managed Care – PPO | Admitting: Genetic Counselor

## 2019-11-18 ENCOUNTER — Other Ambulatory Visit: Payer: Self-pay | Admitting: Genetic Counselor

## 2019-11-18 ENCOUNTER — Encounter: Payer: Self-pay | Admitting: Genetic Counselor

## 2019-11-18 DIAGNOSIS — C250 Malignant neoplasm of head of pancreas: Secondary | ICD-10-CM | POA: Diagnosis not present

## 2019-11-18 DIAGNOSIS — Z800 Family history of malignant neoplasm of digestive organs: Secondary | ICD-10-CM | POA: Insufficient documentation

## 2019-11-18 NOTE — Progress Notes (Signed)
REFERRING PROVIDER: Truitt Merle, MD [address] [place],  [place] [address]  PRIMARY PROVIDER:  Vernie Shanks, MD  PRIMARY REASON FOR VISIT:  1. Malignant neoplasm of head of pancreas ([place])   2. Family history of pancreatic cancer   3. Family history of colon cancer      HISTORY OF PRESENT ILLNESS:   Breanna Noble, a 59 y.o. female, was seen for a [place] cancer genetics consultation at the request of Dr. Burr Medico due to a personal and family history of cancer.  Breanna Noble presents to clinic today to discuss the possibility of a hereditary predisposition to cancer, genetic testing, and to further clarify her future cancer risks, as well as potential cancer risks for family members.   In June 2021, at the age of 69, Breanna Noble was diagnosed with cancer of the head of the pancreas. The treatment plan surgery and chemotherapy.     CANCER HISTORY:  Oncology History Overview Note  Cancer Staging Pancreatic cancer [facility]) Staging form: Exocrine Pancreas, AJCC 8th Edition - Clinical stage from 10/16/2019: Stage IIB (cT2, cN1, cM0) - Signed by Truitt Merle, MD on 10/16/2019    Pancreatic cancer ([place])  10/03/2019 Imaging   US Abdomen 10/03/19  IMPRESSION: Markedly dilated bile ducts. Dilated gallbladder with sludge but no gallstones.   2.9 cm mass in the uncinate process most likely pancreatic neoplasm causing biliary obstruction. Recommend MRI of the pancreas without with contrast for further evaluation. If the patient cannot have MRI, CT of the pancreas with contrast recommended.   10/07/2019 Imaging   MRI Abdomen 10/07/19  IMPRESSION: 1. There is a hypoenhancing mass of the pancreatic uncinate with abrupt obstruction of the central common bile duct, measuring approximately 2.9 cm, consistent with pancreatic adenocarcinoma. 2. Gross intra and extrahepatic biliary ductal dilatation, common bile duct measuring 1.4 cm. Gallbladder hydrops. 3. The pancreatic duct is nondilated. 4. Pancreatic  mass lies closely adjacent to the most central portions of the superior mesenteric vein and central portal vein, due to motion artifact it is difficult to clearly discern whether there is a preserved fat plane. Multiphasic contrast enhanced pancreatic protocol CT may be less motion sensitive and better delineate vascular structures for the purposes of surgical planning if necessary. 5. No evidence of lymphadenopathy or metastatic disease in the abdomen.   10/10/2019 Procedure   EUS by Dr Paulita Fujita 10/10/19  IMPRESSION - A mass was identified at junction of head/uncinate process of the pancreas. This was staged T2 N1 Mx by endosonographic criteria. Fine needle aspiration performed. - A few abnormal lymph nodes were visualized in the peripancreatic region. - There was dilation in the common bile duct which measured up to 14 mm. - Hyperechoic material consistent with sludge was visualized endosonographically in the gallbladder.   10/10/2019 Procedure   ERCP by Dr Paulita Fujita 10/10/19  IMPRESSION - The major papilla appeared normal. - One temporary stent was placed into the ventral pancreatic duct. - A biliary sphincterotomy was performed. - One covered metal stent was placed into the common bile duct.   10/10/2019 Initial Biopsy   FINAL MICROSCOPIC DIAGNOSIS: 10/10/19  A. PANCREAS, HEAD, FINE NEEDLE ASPIRATION:  - Malignant cells present, consistent with adenocarcinoma    10/16/2019 Initial Diagnosis   Pancreatic cancer ([place])   10/16/2019 Cancer Staging   Staging form: Exocrine Pancreas, AJCC 8th Edition - Clinical stage from 10/16/2019: Stage IIB (cT2, cN1, cM0) - Signed by Truitt Merle, MD on 10/16/2019   11/09/2019 -  Chemotherapy  The patient had dexamethasone (DECADRON) 4 MG tablet, 8 mg, Oral, Daily, 1 of 1 cycle, Start date: --, End date: -- palonosetron (ALOXI) injection 0.25 mg, 0.25 mg, Intravenous,  Once, 1 of 4 cycles Administration: 0.25 mg (11/09/2019) pegfilgrastim-cbqv (UDENYCA)  injection 6 mg, 6 mg, Subcutaneous, Once, 1 of 4 cycles Administration: 6 mg (11/11/2019) irinotecan (CAMPTOSAR) 240 mg in sodium chloride 0.9 % 500 mL chemo infusion, 150 mg/m2 = 240 mg, Intravenous,  Once, 1 of 4 cycles Administration: 240 mg (11/09/2019) oxaliplatin (ELOXATIN) 140 mg in dextrose 5 % 500 mL chemo infusion, 85 mg/m2 = 140 mg, Intravenous,  Once, 1 of 4 cycles Administration: 140 mg (11/09/2019) fosaprepitant (EMEND) 150 mg in sodium chloride 0.9 % 145 mL IVPB, 150 mg, Intravenous,  Once, 1 of 4 cycles Administration: 150 mg (11/09/2019) fluorouracil (ADRUCIL) 3,950 mg in sodium chloride 0.9 % 71 mL chemo infusion, 2,400 mg/m2 = 3,950 mg, Intravenous, 1 Day/Dose, 1 of 4 cycles Administration: 3,950 mg (11/09/2019) leucovorin 660 mg in sodium chloride 0.9 % 250 mL infusion, 400 mg/m2 = 660 mg, Intravenous,  Once, 1 of 4 cycles Administration: 660 mg (11/09/2019)  for chemotherapy treatment.       RISK FACTORS:  Menarche was at age 49.  First live birth at age 63.  OCP use for approximately 6 years.  Ovaries intact: yes.  Hysterectomy: no.  Menopausal status: postmenopausal.  HRT use: 0 years. Colonoscopy: yes; 1-2. Mammogram within the last year: yes. Number of breast biopsies: 0. Up to date with pelvic exams: yes. Any excessive radiation exposure in the past: no  Past Medical History:  Diagnosis Date  . Cataracts, bilateral   . Diverticulosis 12/07/2001   found on colonoscopy  . Family history of colon cancer   . Family history of pancreatic cancer   . Hypothyroid 10/2007  . Macular hole   . Pancreatic mass   . PONV (postoperative nausea and vomiting)     Past Surgical History:  Procedure Laterality Date  . BILIARY STENT PLACEMENT  10/10/2019   Procedure: BILIARY STENT PLACEMENT;  Surgeon: Arta Silence, MD;  Location: [facility];  Service: Endoscopy;;  . CATARACT EXTRACTION, BILATERAL    . COLONOSCOPY    . ERCP N/A 10/10/2019   Procedure: ENDOSCOPIC  RETROGRADE CHOLANGIOPANCREATOGRAPHY (ERCP);  Surgeon: Arta Silence, MD;  Location[facility]COPY;  Service: Endoscopy;  Laterality: N/A;  . ESOPHAGOGASTRODUODENOSCOPY N/A 10/10/2019   Procedure: ESOPHAGOGASTRODUODENOSCOPY (EGD);  Surgeon: Arta Silence, MD;  Location: [facility] ENDOSCOPY;  Service: Endoscopy;  Laterality: N/A;  . EUS N/A 10/10/2019   Procedure: UPPER ENDOSCOPIC ULTRASOUND (EUS) LINEAR;  Surgeon: Arta Silence, MD;  Location: [facility];  Service: Endoscopy;  Laterality: N/A;  . FINE NEEDLE ASPIRATION  10/10/2019   Procedure: FINE NEEDLE ASPIRATION (FNA) LINEAR;  Surgeon: Arta Silence, MD;  Location: [facility];  Service: Endoscopy;;  . IR IMAGING GUIDED PORT INSERTION  10/25/2019  . Macular hole surg    . PANCREATIC STENT PLACEMENT  10/10/2019   Procedure: PANCREATIC STENT PLACEMENT;  Surgeon: Arta Silence, MD;  Location[facility] ENDOSCOPY;  Service: Endoscopy;;  . SPHINCTEROTOMY  10/10/2019   Procedure: Joan Mayans;  Surgeon: Arta Silence, MD;  Location[facility] ENDOSCOPY;  Service: Endoscopy;;  . TONSILLECTOMY      Social History   Socioeconomic History  . Marital status: Married    Spouse name: Not on file  . Number of children: 2  . Years of education: Not on file  . Highest education level: Not on file  Occupational  History  . Not on file  Tobacco Use  . Smoking status: Never Smoker  . Smokeless tobacco: Never Used  Vaping Use  . Vaping Use: Never used  Substance and Sexual Activity  . Alcohol use: No    Alcohol/week: 0.0 standard drinks  . Drug use: No  . Sexual activity: Yes    Birth control/protection: Post-menopausal    Comment: 1st intercourse 11 yo-2 partners  Other Topics Concern  . Not on file  Social History Narrative  . Not on file   Social Determinants of Health   Financial Resource Strain:   . Difficulty of Paying Living Expenses:   Food Insecurity:   . Worried About Charity fundraiser in the Last Year:   . Arboriculturist in the Last  Year:   Transportation Needs:   . Film/video editor (Medical):   Marland Kitchen Lack of Transportation (Non-Medical):   Physical Activity:   . Days of Exercise per Week:   . Minutes of Exercise per Session:   Stress:   . Feeling of Stress :   Social Connections:   . Frequency of Communication with Friends and Family:   . Frequency of Social Gatherings with Friends and Family:   . Attends Religious Services:   . Active Member of Clubs or Organizations:   . Attends Archivist Meetings:   Marland Kitchen Marital Status:      FAMILY HISTORY:  We obtained a detailed, 4-generation family history.  Significant diagnoses are listed below: Family History  Problem Relation Age of Onset  . Hypertension Father   . Heart disease Father   . Cancer Maternal Grandmother        PANCREATIC  . Heart disease Maternal Grandfather   . Cancer Maternal Grandfather        lung cancer  . Diabetes Paternal Grandfather   . Cancer Maternal Uncle 60       Colon  . Other Mother        Brain tumor benign  . Dementia Maternal Uncle   . Dementia Paternal Grandmother   . Stroke Paternal Uncle   . Breast cancer Neg Hx     The patient has a son and daughter who are cancer free.  She has two brothers who are cancer free.  Her mother is deceased and her father is living.    Her mother had a brain tumor, that reportedly was benign, but continued to grow.  This caused her death. She has two maternal uncles and a maternal aunt.  One uncle had colon cancer.  The maternal grandparents are deceased.  The grandmother had pancreatic cancer and the grandfather had lung cancer from smoking.  The patient's father is living and has heart disease.  He had three brothers, one who died of a stroke.  His parents are deceased from reportedly non-cancer related issues.  Breanna Noble is unaware of previous family history of genetic testing for hereditary cancer risks. Patient's maternal ancestors are of Caucasian descent, and paternal  ancestors are of Caucasian descent. There is no reported Ashkenazi Jewish ancestry. There is no known consanguinity.  GENETIC COUNSELING ASSESSMENT: Breanna Noble is a 59 y.o. female with a personal and family history of cancer which is somewhat suggestive of a hereditary cancer syndrome and predisposition to cancer given her diagnosis of pancreatic cancer and the family history of pancreatic cancer. We, therefore, discussed and recommended the following at today's visit.   DISCUSSION: We discussed that 15 - 20% of  pancreatic cancer is hereditary, with most cases associated with BRCA mutations.  There are other genes that can be associated with hereditary pancreatic cancer syndromes.  These include PALB2, ATM, CDKN2A and the Lynch syndrome genes.  We discussed that testing is beneficial for several reasons including knowing how to follow individuals after completing their treatment, identifying whether potential treatment options such as PARP inhibitors would be beneficial, and understand if other family members could be at risk for cancer and allow them to undergo genetic testing.   We reviewed the characteristics, features and inheritance patterns of hereditary cancer syndromes. We also discussed genetic testing, including the appropriate family members to test, the process of testing, insurance coverage and turn-around-time for results. We discussed the implications of a negative, positive, carrier and/or variant of uncertain significant result. We recommended Breanna Noble pursue genetic testing for the multi cancer gene panel. The Multi-Gene Panel offered by [facility] includes sequencing and/or deletion duplication testing of the following 85 genes: AIP, ALK, APC, ATM, AXIN2,BAP1,  BARD1, BLM, BMPR1A, BRCA1, BRCA2, BRIP1, CASR, CDC73, CDH1, CDK4, CDKN1B, CDKN1C, CDKN2A (p14ARF), CDKN2A (p16INK4a), CEBPA, CHEK2, CTNNA1, DICER1, DIS3L2, EGFR (c.2369C>T, p.Thr790Met variant only), EPCAM (Deletion/duplication testing  only), FH, FLCN, GATA2, GPC3, GREM1 (Promoter region deletion/duplication testing only), HOXB13 (c.251G>A, p.Gly84Glu), HRAS, KIT, MAX, MEN1, MET, MITF (c.952G>A, p.Glu318Lys variant only), MLH1, MSH2, MSH3, MSH6, MUTYH, NBN, NF1, NF2, NTHL1, PALB2, PDGFRA, PHOX2B, PMS2, POLD1, POLE, POT1, PRKAR1A, PTCH1, PTEN, RAD50, RAD51C, RAD51D, RB1, RECQL4, RET, RNF43, RUNX1, SDHAF2, SDHA (sequence changes only), SDHB, SDHC, SDHD, SMAD4, SMARCA4, SMARCB1, SMARCE1, STK11, SUFU, TERC, TERT, TMEM127, TP53, TSC1, TSC2, VHL, WRN and WT1.  Based on Breanna Noble personal and family history of cancer, she meets medical criteria for genetic testing. Despite that she meets criteria, she may still have an out of pocket cost. We discussed that if her out of pocket cost for testing is over $100, the laboratory will call and confirm whether she wants to proceed with testing.  If the out of pocket cost of testing is less than $100 she will be billed by the genetic testing laboratory.   PLAN: After considering the risks, benefits, and limitations, Breanna Noble provided informed consent to pursue genetic testing and the blood sample was sent to [facility] for analysis of the multi-cancer gene panel. Results should be available within approximately 2-3 weeks' time, at which point they will be disclosed by telephone to Breanna Noble, as will any additional recommendations warranted by these results. Breanna Noble will receive a summary of her genetic counseling visit and a copy of her results once available. This information will also be available in Epic.   Lastly, we encouraged Breanna Noble to remain in contact with cancer genetics annually so that we can continuously update the family history and inform her of any changes in cancer genetics and testing that may be of benefit for this family.   Breanna Noble were answered to her satisfaction today. Our contact information was provided should additional Noble or concerns arise. Thank  you for the referral and allowing [place] to share in the care of your patient.   Lynel Forester P. Florene Glen, [place], [facility] Licensed, Insurance risk surveyor [email or web address] phone: [phone]  The patient was seen for a total of 35 minutes in face-to-face genetic counseling.  This patient was discussed with Drs. Magrinat, Lindi Adie and/or Burr Medico who agrees with the above.    _______________________________________________________________________ For Office Staff:  Number of people involved in session: 1 Was an Intern/ student involved with  case: no

## 2019-11-19 LAB — GENETIC SCREENING ORDER

## 2019-11-20 NOTE — Progress Notes (Signed)
[facility]   [phone] [phone]   Clinic Follow up Note   Patient Care Team: Ileana Ladd, MD as PCP - General (Family Medicine) Fontaine, Nadyne Coombes, MD (Inactive) as Consulting Physician (Gynecology) Malachy Mood, MD as Consulting Physician (Oncology) Radonna Ricker, RN as Registered Nurse Willis Modena, MD as Consulting Physician (Gastroenterology) Almond Lint, MD as Consulting Physician (General Surgery)  Date of Service:  11/25/2019  CHIEF COMPLAINT: F/u of Pancreatic Cancer   SUMMARY OF ONCOLOGIC HISTORY: Oncology History Overview Note  Cancer Staging Pancreatic cancer [facility]) Staging form: Exocrine Pancreas, AJCC 8th Edition - Clinical stage from 10/16/2019: Stage IIB (cT2, cN1, cM0) - Signed by Malachy Mood, MD on 10/16/2019    Pancreatic cancer (HCC)  10/03/2019 Imaging   US Abdomen 10/03/19  IMPRESSION: Markedly dilated bile ducts. Dilated gallbladder with sludge but no gallstones.   2.9 cm mass in the uncinate process most likely pancreatic neoplasm causing biliary obstruction. Recommend MRI of the pancreas without with contrast for further evaluation. If the patient cannot have MRI, CT of the pancreas with contrast recommended.   10/07/2019 Imaging   MRI Abdomen 10/07/19  IMPRESSION: 1. There is a hypoenhancing mass of the pancreatic uncinate with abrupt obstruction of the central common bile duct, measuring approximately 2.9 cm, consistent with pancreatic adenocarcinoma. 2. Gross intra and extrahepatic biliary ductal dilatation, common bile duct measuring 1.4 cm. Gallbladder hydrops. 3. The pancreatic duct is nondilated. 4. Pancreatic mass lies closely adjacent to the most central portions of the superior mesenteric vein and central portal vein, due to motion artifact it is difficult to clearly discern whether there is a preserved fat plane. Multiphasic contrast enhanced pancreatic protocol CT may be less motion sensitive  and better delineate vascular structures for the purposes of surgical planning if necessary. 5. No evidence of lymphadenopathy or metastatic disease in the abdomen.   10/10/2019 Procedure   EUS by Dr Dulce Sellar 10/10/19  IMPRESSION - A mass was identified at junction of head/uncinate process of the pancreas. This was staged T2 N1 Mx by endosonographic criteria. Fine needle aspiration performed. - A few abnormal lymph nodes were visualized in the peripancreatic region. - There was dilation in the common bile duct which measured up to 14 mm. - Hyperechoic material consistent with sludge was visualized endosonographically in the gallbladder.   10/10/2019 Procedure   ERCP by Dr Dulce Sellar 10/10/19  IMPRESSION - The major papilla appeared normal. - One temporary stent was placed into the ventral pancreatic duct. - A biliary sphincterotomy was performed. - One covered metal stent was placed into the common bile duct.   10/10/2019 Initial Biopsy   FINAL MICROSCOPIC DIAGNOSIS: 10/10/19  A. PANCREAS, HEAD, FINE NEEDLE ASPIRATION:  - Malignant cells present, consistent with adenocarcinoma    10/16/2019 Initial Diagnosis   Pancreatic cancer (HCC)   10/16/2019 Cancer Staging   Staging form: Exocrine Pancreas, AJCC 8th Edition - Clinical stage from 10/16/2019: Stage IIB (cT2, cN1, cM0) - Signed by Malachy Mood, MD on 10/16/2019   10/23/2019 Imaging   CT CAP W contrast  IMPRESSION: 1. Pancreatic mass with local adenopathy and potential retroperitoneal adenopathy. Given retroperitoneal lymph nodes and presence of venous collaterals PET scan may be useful for staging purposes. 2. No definite vascular involvement or signs of upper abdominal venous collaterals. Some stranding around the celiac may be due to recent biopsy. 3. Borderline enlarged RIGHT external iliac lymph node. Attention on follow-up. 4. No evidence of metastatic disease in the chest.  5. Signs of recent ERCP and stent placement.     10/25/2019 Procedure   PAC placed    11/09/2019 -  Chemotherapy   Neoadjuvant FOLFIRINOX q2weeks starting 11/09/19 for 8 cycles      CURRENT THERAPY:  Neoadjuvant FOLFIRINOX q2weeks starting 11/09/19 for 8 cycles  INTERVAL HISTORY:  Breanna Noble is here for a follow up. She presents to the clinic alone. She notes after her first cycle chemo she had mild nausea, diarrhea and fatigue which lasted 3 days. She notes her week off she recovered well. She also notes b/l mid back pain after her GCSF pain that last a few days. She denies fever or bleeding during this time. I reviewed her medication list with her.    REVIEW OF SYSTEMS:   Constitutional: Denies fevers, chills or abnormal weight loss Eyes: Denies blurriness of vision Ears, nose, mouth, throat, and face: Denies mucositis or sore throat Respiratory: Denies cough, dyspnea or wheezes Cardiovascular: Denies palpitation, chest discomfort or lower extremity swelling Gastrointestinal:  Denies nausea, heartburn or change in bowel habits Skin: Denies abnormal skin rashes Lymphatics: Denies new lymphadenopathy or easy bruising Neurological:Denies numbness, tingling or new weaknesses Behavioral/Psych: Mood is stable, no new changes  All other systems were reviewed with the patient and are negative.  MEDICAL HISTORY:  Past Medical History:  Diagnosis Date  . Cataracts, bilateral   . Diverticulosis 12/07/2001   found on colonoscopy  . Family history of colon cancer   . Family history of pancreatic cancer   . Hypothyroid 10/2007  . Macular hole   . Pancreatic mass   . PONV (postoperative nausea and vomiting)     SURGICAL HISTORY: Past Surgical History:  Procedure Laterality Date  . BILIARY STENT PLACEMENT  10/10/2019   Procedure: BILIARY STENT PLACEMENT;  Surgeon: Arta Silence, MD;  Location: [place];  Service: Endoscopy;;  . CATARACT EXTRACTION, BILATERAL    . COLONOSCOPY    . ERCP N/A 10/10/2019   Procedure: ENDOSCOPIC  RETROGRADE CHOLANGIOPANCREATOGRAPHY (ERCP);  Surgeon: Arta Silence, MD;  Location: [facility] ENDOSCOPY;  Service: Endoscopy;  Laterality: N/A;  . ESOPHAGOGASTRODUODENOSCOPY N/A 10/10/2019   Procedure: ESOPHAGOGASTRODUODENOSCOPY (EGD);  Surgeon: Arta Silence, MD;  Location: [facility] ENDOSCOPY;  Service: Endoscopy;  Laterality: N/A;  . EUS N/A 10/10/2019   Procedure: UPPER ENDOSCOPIC ULTRASOUND (EUS) LINEAR;  Surgeon: Arta Silence, MD;  Location: [place];  Service: Endoscopy;  Laterality: N/A;  . FINE NEEDLE ASPIRATION  10/10/2019   Procedure: FINE NEEDLE ASPIRATION (FNA) LINEAR;  Surgeon: Arta Silence, MD;  Location: [place];  Service: Endoscopy;;  . IR IMAGING GUIDED PORT INSERTION  10/25/2019  . Macular hole surg    . PANCREATIC STENT PLACEMENT  10/10/2019   Procedure: PANCREATIC STENT PLACEMENT;  Surgeon: Arta Silence, MD;  Location: [facility] ENDOSCOPY;  Service: Endoscopy;;  . SPHINCTEROTOMY  10/10/2019   Procedure: SPHINCTEROTOMY;  Surgeon: Arta Silence, MD;  Location: [facility] ENDOSCOPY;  Service: Endoscopy;;  . TONSILLECTOMY      I have reviewed the social history and family history with the patient and they are unchanged from previous note.  ALLERGIES:  has No Known Allergies.  MEDICATIONS:  Current Outpatient Medications  Medication Sig Dispense Refill  . Ascorbic Acid (VITAMIN C PO) Take 1,000 mg by mouth daily.     . B Complex Vitamins (B-COMPLEX/B-12 PO) Take by mouth.    . Calcium Carb-Cholecalciferol (CALCIUM 1000 + D PO) Take by mouth.    . ferrous sulfate 325 (65 FE) MG tablet Take 325  mg by mouth daily with breakfast.    . levothyroxine (SYNTHROID, LEVOTHROID) 50 MCG tablet Take 1 tablet (50 mcg total) by mouth daily. 90 tablet 4  . lidocaine-prilocaine (EMLA) cream Apply to affected area once 30 g 3  . Multiple Vitamin (MULTIVITAMIN PO) Take by mouth.      . mupirocin ointment (BACTROBAN) 2 % Place 1 application into the nose daily as needed (sore in nose).    .  ondansetron (ZOFRAN) 8 MG tablet Take 1 tablet (8 mg total) by mouth 2 (two) times daily as needed. Start on day 3 after chemotherapy. 30 tablet 1  . prochlorperazine (COMPAZINE) 10 MG tablet Take 1 tablet (10 mg total) by mouth every 6 (six) hours as needed (Nausea or vomiting). 30 tablet 1  . diphenhydrAMINE (BENADRYL) 25 MG tablet Take 25 mg by mouth at bedtime.    . ondansetron (ZOFRAN) 4 MG tablet Take 4 mg by mouth every 8 (eight) hours as needed.     No current facility-administered medications for this visit.   Facility-Administered Medications Ordered in Other Visits  Medication Dose Route Frequency Provider Last Rate Last Admin  . atropine injection 0.5 mg  0.5 mg Intravenous Once PRN Truitt Merle, MD      . dexamethasone (DECADRON) 10 mg in sodium chloride 0.9 % 50 mL IVPB  10 mg Intravenous Once Truitt Merle, MD      . fluorouracil (ADRUCIL) 3,950 mg in sodium chloride 0.9 % 71 mL chemo infusion  2,400 mg/m2 (Treatment Plan Recorded) Intravenous 1 day or 1 dose Truitt Merle, MD      . fosaprepitant (EMEND) 150 mg in sodium chloride 0.9 % 145 mL IVPB  150 mg Intravenous Once Truitt Merle, MD      . irinotecan (CAMPTOSAR) 240 mg in sodium chloride 0.9 % 500 mL chemo infusion  150 mg/m2 (Treatment Plan Recorded) Intravenous Once Truitt Merle, MD      . leucovorin 660 mg in sodium chloride 0.9 % 250 mL infusion  400 mg/m2 (Treatment Plan Recorded) Intravenous Once Truitt Merle, MD      . oxaliplatin (ELOXATIN) 140 mg in dextrose 5 % 500 mL chemo infusion  85 mg/m2 (Treatment Plan Recorded) Intravenous Once Truitt Merle, MD      . palonosetron (ALOXI) injection 0.25 mg  0.25 mg Intravenous Once Truitt Merle, MD        PHYSICAL EXAMINATION: ECOG PERFORMANCE STATUS: 1 - Symptomatic but completely ambulatory  Vitals:   11/25/19 0828  BP: 126/67  Pulse: 85  Resp: 18  Temp: 98.7 F (37.1 C)  SpO2: 100%   Filed Weights   11/25/19 0828  Weight: 132 lb 1.6 oz (59.9 kg)    Due to COVID19 we will limit  examination to appearance. Patient had no complaints.  GENERAL:alert, no distress and comfortable SKIN: skin color normal, no rashes or significant lesions EYES: normal, Conjunctiva are pink and non-injected, sclera clear  NEURO: alert & oriented x 3 with fluent speech   LABORATORY DATA:  I have reviewed the data as listed CBC Latest Ref Rng & Units 11/25/2019 11/08/2019 10/30/2019  WBC 4.0 - 10.5 K/uL 11.3(H) 5.2 5.9  Hemoglobin 12.0 - 15.0 g/dL 12.0 11.5(L) 11.9(L)  Hematocrit 36 - 46 % 37.1 35.9(L) 37.0  Platelets 150 - 400 K/uL 184 250 246     CMP Latest Ref Rng & Units 11/25/2019 11/08/2019 10/30/2019  Glucose 70 - 99 mg/dL 102(H) 105(H) 118(H)  BUN 6 - 20 mg/dL 5(L) 10 8  Creatinine 0.44 - 1.00 mg/dL 0.63 0.64 0.69  Sodium 135 - 145 mmol/L 141 140 139  Potassium 3.5 - 5.1 mmol/L 4.1 4.1 3.9  Chloride 98 - 111 mmol/L 105 105 103  CO2 22 - 32 mmol/L 26 26 29   Calcium 8.9 - 10.3 mg/dL 9.8 9.3 9.1  Total Protein 6.5 - 8.1 g/dL 6.3(L) 6.5 6.7  Total Bilirubin 0.3 - 1.2 mg/dL 0.7 1.7(H) 2.7(H)  Alkaline Phos 38 - 126 U/L 114 128(H) 165(H)  AST 15 - 41 U/L 31 54(H) 53(H)  ALT 0 - 44 U/L 28 62(H) 65(H)      RADIOGRAPHIC STUDIES: I have personally reviewed the radiological images as listed and agreed with the findings in the report. No results found.   ASSESSMENT & PLAN:  Breanna Noble is a 59 y.o. female with    1. Pancreatic Cancer, Stage IIB, T2N0 -She was recently diagnosed in 10/2019. Her initial US showed bile duct and gallbladder dilation due to 2-3cm mass at head of pancreas/Uncinate. Her MRI showed her mass is laying close to SMV but not involving it. Her CT CAP from 10/23/19 showed borderline potential adenopathy in the periaortic region which will be followed on restaging scans.  There is no definite vascular involvement or distant metastasis.  -She underwent EUS and ERCP on 10/10/19 and had 2 stents placed by Dr Paulita Fujita. Her pancreatic mass biopsy showed Adenocarcinoma of  Pancreas. Endoscopic staging T2N1, no evidence of vascular involvement. -I discussed pancreatic cancer is aggressive disease that has very high risk of recurrence. Her cancer is resectable. I discussed standard Whipple surgery is curative. I discussed this is a large surgery. Due to the tumor location and positive nodes, I recommend neoadjuvant chemotherapy to shrink her tumor and reduce her high risk of recurrence.  -I started her on neoadjuvant chemo with FOLFIRINOX every 2 weeks for 8 cycles over 4 months beginning 11/09/19. Plan for 4 months. If she responds well enough she will proceed with surgery.  -S/p C1 she tolerated mostly well with mild nausea, diarrhea and fatigue and mid b/l back pain from GCSF injection which lasted 3 days. She was able to fully recover by her week off.  -Labs reviewed, overall adequate to proceed with C2 FOLFIRINOX today. -I discussed watching for increasing side effects. She may expect more hair shedding the more chemo she gets. I encouraged her to contact clinic for significant side effects, dehydration or weight concerns or for signs of infection. She voiced good understanding.  -f/u in 2 weeks before cycle 3    2. Obstructive Jaundice, s/p stent placement  -resolved now  3. Upper Abdominal Pain, Mild weight loss  -Presented with initial jaundice but since stent placement her pain has much improved.Her pain has resolved.  -Weight is stable, she met with our dietitian. Continue supplements -Will monitor her weight on chemo treatment. She will continue to f/u with dietician.   4. Genetic Testing -She has maternal family history of pancreatic and colon cancer. I discussed she is eligible of genetic testing. She proceeded with Genetic testing on 11/18/19 and results are pending.    PLAN:  -Labs reviewed and adequate to proceed with C2 FOLFIRINOX today at same dose, with GCSF on day 3  -Lab, flush, f/u and chem in 2 weeks    No problem-specific Assessment  & Plan notes found for this encounter.   No orders of the defined types were placed in this encounter.  All questions were answered. The patient knows to  call the clinic with any problems, questions or concerns. No barriers to learning was detected. The total time spent in the appointment was 30 minutes.     Truitt Merle, MD 11/25/2019   I, Joslyn Devon, am acting as scribe for Truitt Merle, MD.   I have reviewed the above documentation for accuracy and completeness, and I agree with the above.

## 2019-11-25 ENCOUNTER — Inpatient Hospital Stay: Payer: BC Managed Care – PPO | Admitting: Nutrition

## 2019-11-25 ENCOUNTER — Inpatient Hospital Stay: Payer: BC Managed Care – PPO

## 2019-11-25 ENCOUNTER — Inpatient Hospital Stay: Payer: BC Managed Care – PPO | Admitting: Hematology

## 2019-11-25 ENCOUNTER — Other Ambulatory Visit: Payer: Self-pay

## 2019-11-25 ENCOUNTER — Encounter: Payer: Self-pay | Admitting: Hematology

## 2019-11-25 VITALS — BP 126/67 | HR 85 | Temp 98.7°F | Resp 18 | Wt 132.1 lb

## 2019-11-25 DIAGNOSIS — R1010 Upper abdominal pain, unspecified: Secondary | ICD-10-CM | POA: Diagnosis not present

## 2019-11-25 DIAGNOSIS — C250 Malignant neoplasm of head of pancreas: Secondary | ICD-10-CM | POA: Diagnosis not present

## 2019-11-25 DIAGNOSIS — C189 Malignant neoplasm of colon, unspecified: Secondary | ICD-10-CM | POA: Diagnosis not present

## 2019-11-25 DIAGNOSIS — Z95828 Presence of other vascular implants and grafts: Secondary | ICD-10-CM

## 2019-11-25 DIAGNOSIS — C257 Malignant neoplasm of other parts of pancreas: Secondary | ICD-10-CM | POA: Diagnosis not present

## 2019-11-25 DIAGNOSIS — Z5111 Encounter for antineoplastic chemotherapy: Secondary | ICD-10-CM | POA: Diagnosis not present

## 2019-11-25 DIAGNOSIS — K831 Obstruction of bile duct: Secondary | ICD-10-CM | POA: Diagnosis not present

## 2019-11-25 DIAGNOSIS — Z5189 Encounter for other specified aftercare: Secondary | ICD-10-CM | POA: Diagnosis not present

## 2019-11-25 DIAGNOSIS — Z452 Encounter for adjustment and management of vascular access device: Secondary | ICD-10-CM | POA: Diagnosis not present

## 2019-11-25 LAB — CBC WITH DIFFERENTIAL (CANCER CENTER ONLY)
Abs Immature Granulocytes: 0.23 10*3/uL — ABNORMAL HIGH (ref 0.00–0.07)
Basophils Absolute: 0.1 10*3/uL (ref 0.0–0.1)
Basophils Relative: 1 %
Eosinophils Absolute: 0.3 10*3/uL (ref 0.0–0.5)
Eosinophils Relative: 2 %
HCT: 37.1 % (ref 36.0–46.0)
Hemoglobin: 12 g/dL (ref 12.0–15.0)
Immature Granulocytes: 2 %
Lymphocytes Relative: 23 %
Lymphs Abs: 2.6 10*3/uL (ref 0.7–4.0)
MCH: 30.5 pg (ref 26.0–34.0)
MCHC: 32.3 g/dL (ref 30.0–36.0)
MCV: 94.4 fL (ref 80.0–100.0)
Monocytes Absolute: 1.1 10*3/uL — ABNORMAL HIGH (ref 0.1–1.0)
Monocytes Relative: 9 %
Neutro Abs: 7.1 10*3/uL (ref 1.7–7.7)
Neutrophils Relative %: 63 %
Platelet Count: 184 10*3/uL (ref 150–400)
RBC: 3.93 MIL/uL (ref 3.87–5.11)
RDW: 15.9 % — ABNORMAL HIGH (ref 11.5–15.5)
WBC Count: 11.3 10*3/uL — ABNORMAL HIGH (ref 4.0–10.5)
nRBC: 0 % (ref 0.0–0.2)

## 2019-11-25 LAB — CMP (CANCER CENTER ONLY)
ALT: 28 U/L (ref 0–44)
AST: 31 U/L (ref 15–41)
Albumin: 3.5 g/dL (ref 3.5–5.0)
Alkaline Phosphatase: 114 U/L (ref 38–126)
Anion gap: 10 (ref 5–15)
BUN: 5 mg/dL — ABNORMAL LOW (ref 6–20)
CO2: 26 mmol/L (ref 22–32)
Calcium: 9.8 mg/dL (ref 8.9–10.3)
Chloride: 105 mmol/L (ref 98–111)
Creatinine: 0.63 mg/dL (ref 0.44–1.00)
GFR, Est AFR Am: >60 mL/min (ref >60)
GFR, Estimated: >60 mL/min (ref >60)
Glucose, Bld: 102 mg/dL — ABNORMAL HIGH (ref 70–99)
Potassium: 4.1 mmol/L (ref 3.5–5.1)
Sodium: 141 mmol/L (ref 135–145)
Total Bilirubin: 0.7 mg/dL (ref 0.3–1.2)
Total Protein: 6.3 g/dL — ABNORMAL LOW (ref 6.5–8.1)

## 2019-11-25 MED ORDER — DEXTROSE 5 % IV SOLN
Freq: Once | INTRAVENOUS | Status: AC
  Filled 2019-11-25: qty 250

## 2019-11-25 MED ORDER — SODIUM CHLORIDE 0.9 % IV SOLN
150.0000 mg | Freq: Once | INTRAVENOUS | Status: AC
  Administered 2019-11-25: 150 mg via INTRAVENOUS
  Filled 2019-11-25: qty 150

## 2019-11-25 MED ORDER — ATROPINE SULFATE 1 MG/ML IJ SOLN
INTRAMUSCULAR | Status: AC
  Filled 2019-11-25: qty 1

## 2019-11-25 MED ORDER — SODIUM CHLORIDE 0.9 % IV SOLN
400.0000 mg/m2 | Freq: Once | INTRAVENOUS | Status: AC
  Administered 2019-11-25: 660 mg via INTRAVENOUS
  Filled 2019-11-25: qty 33

## 2019-11-25 MED ORDER — SODIUM CHLORIDE 0.9 % IV SOLN
150.0000 mg/m2 | Freq: Once | INTRAVENOUS | Status: AC
  Administered 2019-11-25: 240 mg via INTRAVENOUS
  Filled 2019-11-25: qty 12

## 2019-11-25 MED ORDER — SODIUM CHLORIDE 0.9% FLUSH
10.0000 mL | INTRAVENOUS | Status: DC | PRN
  Administered 2019-11-25: 10 mL via INTRAVENOUS
  Filled 2019-11-25: qty 10

## 2019-11-25 MED ORDER — PALONOSETRON HCL INJECTION 0.25 MG/5ML
INTRAVENOUS | Status: AC
  Filled 2019-11-25: qty 5

## 2019-11-25 MED ORDER — OXALIPLATIN CHEMO INJECTION 100 MG/20ML
85.0000 mg/m2 | Freq: Once | INTRAVENOUS | Status: AC
  Administered 2019-11-25: 140 mg via INTRAVENOUS
  Filled 2019-11-25: qty 20

## 2019-11-25 MED ORDER — PALONOSETRON HCL INJECTION 0.25 MG/5ML
0.2500 mg | Freq: Once | INTRAVENOUS | Status: AC
  Administered 2019-11-25: 0.25 mg via INTRAVENOUS

## 2019-11-25 MED ORDER — SODIUM CHLORIDE 0.9 % IV SOLN
2400.0000 mg/m2 | INTRAVENOUS | Status: DC
  Administered 2019-11-25: 3950 mg via INTRAVENOUS
  Filled 2019-11-25: qty 79

## 2019-11-25 MED ORDER — SODIUM CHLORIDE 0.9 % IV SOLN
10.0000 mg | Freq: Once | INTRAVENOUS | Status: AC
  Administered 2019-11-25: 10 mg via INTRAVENOUS
  Filled 2019-11-25: qty 10

## 2019-11-25 MED ORDER — ATROPINE SULFATE 1 MG/ML IJ SOLN
0.5000 mg | Freq: Once | INTRAMUSCULAR | Status: AC | PRN
  Administered 2019-11-25: 0.5 mg via INTRAVENOUS

## 2019-11-25 NOTE — Patient Instructions (Signed)
[facility] Discharge Instructions for Patients Receiving Chemotherapy  Today you received the following chemotherapy agents Oxaliplatin, Irrinotecan, Leucovorin, 5FU  To help prevent nausea and vomiting after your treatment, we encourage you to take your nausea medication DO NOT TAKE ZOFRAN FOR THREE DAYS AFTER TREATMENT.   If you develop nausea and vomiting that is not controlled by your nausea medication, call the clinic.   BELOW ARE SYMPTOMS THAT SHOULD BE REPORTED IMMEDIATELY:  *FEVER GREATER THAN 100.5 F  *CHILLS WITH OR WITHOUT FEVER  NAUSEA AND VOMITING THAT IS NOT CONTROLLED WITH YOUR NAUSEA MEDICATION  *UNUSUAL SHORTNESS OF BREATH  *UNUSUAL BRUISING OR BLEEDING  TENDERNESS IN MOUTH AND THROAT WITH OR WITHOUT PRESENCE OF ULCERS  *URINARY PROBLEMS  *BOWEL PROBLEMS  UNUSUAL RASH Items with * indicate a potential emergency and should be followed up as soon as possible.  Feel free to call the clinic should you have any questions or concerns. The clinic phone number is [phone].  Please show the [place] at check-in to the Emergency Department and triage nurse.

## 2019-11-25 NOTE — Progress Notes (Signed)
Nutrition follow-up with patient during infusion for pancreas cancer. Patient reports she has had some nausea but no vomiting. Reports increased fatigue. She is added some meat in her diet and is enjoying it. Weight improved and documented as 132.1 pounds July 26.  Nutrition diagnosis: Food and nutrition related knowledge deficit has improved.  Intervention: Educated patient to continue strategies for healthy plant-based diet with small frequent meals and snacks. Educated patient to continue strategies for weight stabilization. Questions were answered.  Teach back method used.  Monitoring, evaluation, goals: Patient will tolerate adequate calories and protein to minimize weight loss.  Next visit: Monday, August 9 during infusion.  **Disclaimer: This note was dictated with voice recognition software. Similar sounding words can inadvertently be transcribed and this note may contain transcription errors which may not have been corrected upon publication of note.**

## 2019-11-25 NOTE — Patient Instructions (Signed)

## 2019-11-26 ENCOUNTER — Telehealth: Payer: Self-pay | Admitting: Hematology

## 2019-11-26 NOTE — Telephone Encounter (Signed)
Scheduled per 7/26 los. Noted to give pt appt calendar on next visit.

## 2019-11-27 ENCOUNTER — Other Ambulatory Visit: Payer: Self-pay

## 2019-11-27 ENCOUNTER — Inpatient Hospital Stay: Payer: BC Managed Care – PPO

## 2019-11-27 VITALS — BP 125/57 | HR 65 | Temp 98.4°F | Resp 16

## 2019-11-27 DIAGNOSIS — K831 Obstruction of bile duct: Secondary | ICD-10-CM | POA: Diagnosis not present

## 2019-11-27 DIAGNOSIS — Z452 Encounter for adjustment and management of vascular access device: Secondary | ICD-10-CM | POA: Diagnosis not present

## 2019-11-27 DIAGNOSIS — Z5111 Encounter for antineoplastic chemotherapy: Secondary | ICD-10-CM | POA: Diagnosis not present

## 2019-11-27 DIAGNOSIS — Z5189 Encounter for other specified aftercare: Secondary | ICD-10-CM | POA: Diagnosis not present

## 2019-11-27 DIAGNOSIS — C250 Malignant neoplasm of head of pancreas: Secondary | ICD-10-CM

## 2019-11-27 DIAGNOSIS — C257 Malignant neoplasm of other parts of pancreas: Secondary | ICD-10-CM | POA: Diagnosis not present

## 2019-11-27 DIAGNOSIS — R1010 Upper abdominal pain, unspecified: Secondary | ICD-10-CM | POA: Diagnosis not present

## 2019-11-27 MED ORDER — PEGFILGRASTIM-CBQV 6 MG/0.6ML ~~LOC~~ SOSY
PREFILLED_SYRINGE | SUBCUTANEOUS | Status: AC
  Filled 2019-11-27: qty 0.6

## 2019-11-27 MED ORDER — SODIUM CHLORIDE 0.9% FLUSH
10.0000 mL | INTRAVENOUS | Status: DC | PRN
  Administered 2019-11-27: 10 mL
  Filled 2019-11-27: qty 10

## 2019-11-27 MED ORDER — PEGFILGRASTIM-CBQV 6 MG/0.6ML ~~LOC~~ SOSY
6.0000 mg | PREFILLED_SYRINGE | Freq: Once | SUBCUTANEOUS | Status: AC
  Administered 2019-11-27: 6 mg via SUBCUTANEOUS

## 2019-11-27 MED ORDER — HEPARIN SOD (PORK) LOCK FLUSH 100 UNIT/ML IV SOLN
500.0000 [IU] | Freq: Once | INTRAVENOUS | Status: AC | PRN
  Administered 2019-11-27: 500 [IU]
  Filled 2019-11-27: qty 5

## 2019-11-27 NOTE — Patient Instructions (Signed)

## 2019-11-28 ENCOUNTER — Ambulatory Visit: Payer: Self-pay | Admitting: Genetic Counselor

## 2019-11-28 ENCOUNTER — Encounter: Payer: Self-pay | Admitting: Genetic Counselor

## 2019-11-28 ENCOUNTER — Telehealth: Payer: Self-pay | Admitting: Genetic Counselor

## 2019-11-28 DIAGNOSIS — C250 Malignant neoplasm of head of pancreas: Secondary | ICD-10-CM

## 2019-11-28 DIAGNOSIS — Z1379 Encounter for other screening for genetic and chromosomal anomalies: Secondary | ICD-10-CM

## 2019-11-28 NOTE — Progress Notes (Signed)
HPI:  Ms. Lotz was previously seen in the [facility] due to a personal and family history of cancer and concerns regarding a hereditary predisposition to cancer. Please refer to our prior cancer genetics clinic note for more information regarding our discussion, assessment and recommendations, at the time. Ms. Onken recent genetic test results were disclosed to her, as were recommendations warranted by these results. These results and recommendations are discussed in more detail below.  CANCER HISTORY:  Oncology History Overview Note  Cancer Staging Pancreatic cancer [facility]) Staging form: Exocrine Pancreas, AJCC 8th Edition - Clinical stage from 10/16/2019: Stage IIB (cT2, cN1, cM0) - Signed by Truitt Merle, MD on 10/16/2019    Pancreatic cancer ([place])  10/03/2019 Imaging   US Abdomen 10/03/19  IMPRESSION: Markedly dilated bile ducts. Dilated gallbladder with sludge but no gallstones.   2.9 cm mass in the uncinate process most likely pancreatic neoplasm causing biliary obstruction. Recommend MRI of the pancreas without with contrast for further evaluation. If the patient cannot have MRI, CT of the pancreas with contrast recommended.   10/07/2019 Imaging   MRI Abdomen 10/07/19  IMPRESSION: 1. There is a hypoenhancing mass of the pancreatic uncinate with abrupt obstruction of the central common bile duct, measuring approximately 2.9 cm, consistent with pancreatic adenocarcinoma. 2. Gross intra and extrahepatic biliary ductal dilatation, common bile duct measuring 1.4 cm. Gallbladder hydrops. 3. The pancreatic duct is nondilated. 4. Pancreatic mass lies closely adjacent to the most central portions of the superior mesenteric vein and central portal vein, due to motion artifact it is difficult to clearly discern whether there is a preserved fat plane. Multiphasic contrast enhanced pancreatic protocol CT may be less motion sensitive and better delineate vascular structures for  the purposes of surgical planning if necessary. 5. No evidence of lymphadenopathy or metastatic disease in the abdomen.   10/10/2019 Procedure   EUS by Dr Paulita Fujita 10/10/19  IMPRESSION - A mass was identified at junction of head/uncinate process of the pancreas. This was staged T2 N1 Mx by endosonographic criteria. Fine needle aspiration performed. - A few abnormal lymph nodes were visualized in the peripancreatic region. - There was dilation in the common bile duct which measured up to 14 mm. - Hyperechoic material consistent with sludge was visualized endosonographically in the gallbladder.   10/10/2019 Procedure   ERCP by Dr Paulita Fujita 10/10/19  IMPRESSION - The major papilla appeared normal. - One temporary stent was placed into the ventral pancreatic duct. - A biliary sphincterotomy was performed. - One covered metal stent was placed into the common bile duct.   10/10/2019 Initial Biopsy   FINAL MICROSCOPIC DIAGNOSIS: 10/10/19  A. PANCREAS, HEAD, FINE NEEDLE ASPIRATION:  - Malignant cells present, consistent with adenocarcinoma    10/16/2019 Initial Diagnosis   Pancreatic cancer ([place])   10/16/2019 Cancer Staging   Staging form: Exocrine Pancreas, AJCC 8th Edition - Clinical stage from 10/16/2019: Stage IIB (cT2, cN1, cM0) - Signed by Truitt Merle, MD on 10/16/2019   10/23/2019 Imaging   CT CAP W contrast  IMPRESSION: 1. Pancreatic mass with local adenopathy and potential retroperitoneal adenopathy. Given retroperitoneal lymph nodes and presence of venous collaterals PET scan may be useful for staging purposes. 2. No definite vascular involvement or signs of upper abdominal venous collaterals. Some stranding around the celiac may be due to recent biopsy. 3. Borderline enlarged RIGHT external iliac lymph node. Attention on follow-up. 4. No evidence of metastatic disease in the chest. 5. Signs of recent ERCP  and stent placement.   10/25/2019 Procedure   PAC placed    11/09/2019 -   Chemotherapy   Neoadjuvant FOLFIRINOX q2weeks starting 11/09/19 for 8 cycles   11/28/2019 Genetic Testing   Negative genetic testing on the common hereditary cancer panel, pancreatic cancer panel, preliminary evidence pancreatic cancer panel and chronic pancreatitis panel.  The Common Hereditary Gene Panel offered by [facility] includes sequencing and/or deletion duplication testing of the following 55 genes: APC*, ATM*, AXIN2, BARD1, BMPR1A, BRCA1, BRCA2, BRIP1, CASR, CDH1, CDK4, CDKN2A (p14ARF), CDKN2A (p16INK4a), CFTR*, CHEK2, CPA1, CTNNA1, CTRC, DICER1*, EPCAM*, FANCC, GREM1*, HOXB13, KIT, MEN1*, MLH1*, MSH2*, MSH3*, MSH6*, MUTYH, NBN, NF1*, NTHL1, PALB2, PALLD, PDGFRA, PMS2*, POLD1*, POLE, PRSS1*, PTEN*, RAD50, RAD51C, RAD51D, SDHA*, SDHB, SDHC*, SDHD, SMAD4, SMARCA4, SPINK1, STK11, TP53, TSC1*, TSC2, and VHL .  The report date is November 28, 2019.     FAMILY HISTORY:  We obtained a detailed, 4-generation family history.  Significant diagnoses are listed below: Family History  Problem Relation Age of Onset  . Hypertension Father   . Heart disease Father   . Cancer Maternal Grandmother        PANCREATIC  . Heart disease Maternal Grandfather   . Cancer Maternal Grandfather        lung cancer  . Diabetes Paternal Grandfather   . Cancer Maternal Uncle 60       Colon  . Other Mother        Brain tumor benign  . Dementia Maternal Uncle   . Dementia Paternal Grandmother   . Stroke Paternal Uncle   . Breast cancer Neg Hx     The patient has a son and daughter who are cancer free.  She has two brothers who are cancer free.  Her mother is deceased and her father is living.    Her mother had a brain tumor, that reportedly was benign, but continued to grow.  This caused her death. She has two maternal uncles and a maternal aunt.  One uncle had colon cancer.  The maternal grandparents are deceased.  The grandmother had pancreatic cancer and the grandfather had lung cancer from smoking.  The  patient's father is living and has heart disease.  He had three brothers, one who died of a stroke.  His parents are deceased from reportedly non-cancer related issues.  Ms. Palomino is unaware of previous family history of genetic testing for hereditary cancer risks. Patient's maternal ancestors are of Caucasian descent, and paternal ancestors are of Caucasian descent. There is no reported Ashkenazi Jewish ancestry. There is no known consanguinity.    GENETIC TEST RESULTS: Genetic testing reported out on November 28, 2019 through the custom cancer panel found no pathogenic mutations. The Common Hereditary Gene Panel offered by [facility] includes sequencing and/or deletion duplication testing of the following 55 genes: APC*, ATM*, AXIN2, BARD1, BMPR1A, BRCA1, BRCA2, BRIP1, CASR, CDH1, CDK4, CDKN2A (p14ARF), CDKN2A (p16INK4a), CFTR*, CHEK2, CPA1, CTNNA1, CTRC, DICER1*, EPCAM*, FANCC, GREM1*, HOXB13, KIT, MEN1*, MLH1*, MSH2*, MSH3*, MSH6*, MUTYH, NBN, NF1*, NTHL1, PALB2, PALLD, PDGFRA, PMS2*, POLD1*, POLE, PRSS1*, PTEN*, RAD50, RAD51C, RAD51D, SDHA*, SDHB, SDHC*, SDHD, SMAD4, SMARCA4, SPINK1, STK11, TP53, TSC1*, TSC2, and VHL  The test report has been scanned into EPIC and is located under the Molecular Pathology section of the Results Review tab.  A portion of the result report is included below for reference.     We discussed with Ms. Cutbirth that because current genetic testing is not perfect, it is possible there may be a gene  mutation in one of these genes that current testing cannot detect, but that chance is small.  We also discussed, that there could be another gene that has not yet been discovered, or that we have not yet tested, that is responsible for the cancer diagnoses in the family. It is also possible there is a hereditary cause for the cancer in the family that Ms. Klimaszewski did not inherit and therefore was not identified in her testing.  Therefore, it is important to remain in touch with cancer genetics in  the future so that we can continue to offer Ms. Odonoghue the most up to date genetic testing.   ADDITIONAL GENETIC TESTING: We discussed with Ms. Traynham that there are other genes that are associated with increased cancer risk that can be analyzed. Should Ms. Popper wish to pursue additional genetic testing, we are happy to discuss and coordinate this testing, at any time.    CANCER SCREENING RECOMMENDATIONS: Ms. Paske test result is considered negative (normal).  This means that we have not identified a hereditary cause for her personal and family history of cancer at this time. Most cancers happen by chance and this negative test suggests that her cancer may fall into this category.    While reassuring, this does not definitively rule out a hereditary predisposition to cancer. It is still possible that there could be genetic mutations that are undetectable by current technology. There could be genetic mutations in genes that have not been tested or identified to increase cancer risk.  Therefore, it is recommended she continue to follow the cancer management and screening guidelines provided by her oncology and primary healthcare provider.   An individual's cancer risk and medical management are not determined by genetic test results alone. Overall cancer risk assessment incorporates additional factors, including personal medical history, family history, and any available genetic information that may result in a personalized plan for cancer prevention and surveillance  RECOMMENDATIONS FOR FAMILY MEMBERS:  Individuals in this family might be at some increased risk of developing cancer, over the general population risk, simply due to the family history of cancer.  We recommended women in this family have a yearly mammogram beginning at age 30, or 45 years younger than the earliest onset of cancer, an annual clinical breast exam, and perform monthly breast self-exams. Women in this family should also have a  gynecological exam as recommended by their primary provider. All family members should be referred for colonoscopy starting at age 9.  FOLLOW-UP: Lastly, we discussed with Ms. Cureton that cancer genetics is a rapidly advancing field and it is possible that new genetic tests will be appropriate for her and/or her family members in the future. We encouraged her to remain in contact with cancer genetics on an annual basis so we can update her personal and family histories and let her know of advances in cancer genetics that may benefit this family.   Our contact number was provided. Ms. Wiehe questions were answered to her satisfaction, and she knows she is welcome to call us at anytime with additional questions or concerns.   Roma Kayser, [place], [facility] Licensed, Certified Genetic Counselor [email or web address]

## 2019-11-28 NOTE — Telephone Encounter (Signed)
LM on VM that results are back and to please call. 

## 2019-11-28 NOTE — Telephone Encounter (Signed)
Revealed negative genetic testing.  Discussed that we do not know why she has breast cancer or why there is cancer in the family. It could be due to a different gene that we are not testing, or maybe our current technology may not be able to pick something up.  It will be important for her to keep in contact with genetics to keep up with whether additional testing may be needed. 

## 2019-12-04 NOTE — Progress Notes (Signed)
Breanna Noble   [phone]) [phone] [phone]) [phone]   Clinic Follow up Note   Patient Care Team: Vernie Shanks, MD as PCP - General (Family Medicine) Fontaine, Belinda Block, MD (Inactive) as Consulting Physician (Gynecology) Truitt Merle, MD as Consulting Physician (Oncology) Jonnie Finner, RN as Registered Nurse Arta Silence, MD as Consulting Physician (Gastroenterology) Stark Klein, MD as Consulting Physician (General Surgery) Karie Mainland, RD as Dietitian (Nutrition)  Date of Service:  12/09/2019  CHIEF COMPLAINT: F/u of Pancreatic Cancer   SUMMARY OF ONCOLOGIC HISTORY: Oncology History Overview Note  Cancer Staging Pancreatic cancer [facility]) Staging form: Exocrine Pancreas, AJCC 8th Edition - Clinical stage from 10/16/2019: Stage IIB (cT2, cN1, cM0) - Signed by Truitt Merle, MD on 10/16/2019    Pancreatic cancer ([place])  10/03/2019 Imaging   US Abdomen 10/03/19  IMPRESSION: Markedly dilated bile ducts. Dilated gallbladder with sludge but no gallstones.   2.9 cm mass in the uncinate process most likely pancreatic neoplasm causing biliary obstruction. Recommend MRI of the pancreas without with contrast for further evaluation. If the patient cannot have MRI, CT of the pancreas with contrast recommended.   10/07/2019 Imaging   MRI Abdomen 10/07/19  IMPRESSION: 1. There is a hypoenhancing mass of the pancreatic uncinate with abrupt obstruction of the central common bile duct, measuring approximately 2.9 cm, consistent with pancreatic adenocarcinoma. 2. Gross intra and extrahepatic biliary ductal dilatation, common bile duct measuring 1.4 cm. Gallbladder hydrops. 3. The pancreatic duct is nondilated. 4. Pancreatic mass lies closely adjacent to the most central portions of the superior mesenteric vein and central portal vein, due to motion artifact it is difficult to clearly discern whether there is a preserved fat plane. Multiphasic contrast  enhanced pancreatic protocol CT may be less motion sensitive and better delineate vascular structures for the purposes of surgical planning if necessary. 5. No evidence of lymphadenopathy or metastatic disease in the abdomen.   10/10/2019 Procedure   EUS by Dr Paulita Fujita 10/10/19  IMPRESSION - A mass was identified at junction of head/uncinate process of the pancreas. This was staged T2 N1 Mx by endosonographic criteria. Fine needle aspiration performed. - A few abnormal lymph nodes were visualized in the peripancreatic region. - There was dilation in the common bile duct which measured up to 14 mm. - Hyperechoic material consistent with sludge was visualized endosonographically in the gallbladder.   10/10/2019 Procedure   ERCP by Dr Paulita Fujita 10/10/19  IMPRESSION - The major papilla appeared normal. - One temporary stent was placed into the ventral pancreatic duct. - A biliary sphincterotomy was performed. - One covered metal stent was placed into the common bile duct.   10/10/2019 Initial Biopsy   FINAL MICROSCOPIC DIAGNOSIS: 10/10/19  A. PANCREAS, HEAD, FINE NEEDLE ASPIRATION:  - Malignant cells present, consistent with adenocarcinoma    10/16/2019 Initial Diagnosis   Pancreatic cancer ([place])   10/16/2019 Cancer Staging   Staging form: Exocrine Pancreas, AJCC 8th Edition - Clinical stage from 10/16/2019: Stage IIB (cT2, cN1, cM0) - Signed by Truitt Merle, MD on 10/16/2019   10/23/2019 Imaging   CT CAP W contrast  IMPRESSION: 1. Pancreatic mass with local adenopathy and potential retroperitoneal adenopathy. Given retroperitoneal lymph nodes and presence of venous collaterals PET scan may be useful for staging purposes. 2. No definite vascular involvement or signs of upper abdominal venous collaterals. Some stranding around the celiac may be due to recent biopsy. 3. Borderline enlarged RIGHT external iliac lymph node. Attention on follow-up. 4. No  evidence of metastatic disease in the  chest. 5. Signs of recent ERCP and stent placement.   10/25/2019 Procedure   PAC placed    11/09/2019 -  Chemotherapy   Neoadjuvant FOLFIRINOX q2weeks starting 11/09/19 for 8 cycles   11/28/2019 Genetic Testing   Negative genetic testing on the common hereditary cancer panel, pancreatic cancer panel, preliminary evidence pancreatic cancer panel and chronic pancreatitis panel.  The Common Hereditary Gene Panel offered by [facility] includes sequencing and/or deletion duplication testing of the following 55 genes: APC*, ATM*, AXIN2, BARD1, BMPR1A, BRCA1, BRCA2, BRIP1, CASR, CDH1, CDK4, CDKN2A (p14ARF), CDKN2A (p16INK4a), CFTR*, CHEK2, CPA1, CTNNA1, CTRC, DICER1*, EPCAM*, FANCC, GREM1*, HOXB13, KIT, MEN1*, MLH1*, MSH2*, MSH3*, MSH6*, MUTYH, NBN, NF1*, NTHL1, PALB2, PALLD, PDGFRA, PMS2*, POLD1*, POLE, PRSS1*, PTEN*, RAD50, RAD51C, RAD51D, SDHA*, SDHB, SDHC*, SDHD, SMAD4, SMARCA4, SPINK1, STK11, TP53, TSC1*, TSC2, and VHL .  The report date is November 28, 2019.      CURRENT THERAPY:  Neoadjuvant FOLFIRINOX q2weeks starting 11/09/19 for 8 cycles  INTERVAL HISTORY:  Breanna Noble is here for a follow up and treatment. She presents to the clinic alone. She notes her 2nd cycle chemo went moderately well. She notes she experiences nausea without vomiting. This lasts only for the night of chemo and resolved day 2. She used compazine which helped. Her weight has decreased 4 pounds in 2 weeks. She notes her energy is decreased week of treatment and on week off her energy improves. She plans to start Protein shakes twice daily. She notes her RUQ pain is occasional and stable. She notes she is due for Pap Smear with her Gyn. Her last was last year. She is fine to proceed if she wants to.    REVIEW OF SYSTEMS:   Constitutional: Denies fevers, chills (+) Mild weight loss Eyes: Denies blurriness of vision Ears, nose, mouth, throat, and face: Denies mucositis or sore throat Respiratory: Denies cough, dyspnea or  wheezes Cardiovascular: Denies palpitation, chest discomfort or lower extremity swelling Gastrointestinal:  Denies nausea, heartburn or change in bowel habits (+) Occasional RUQ pain Skin: Denies abnormal skin rashes Lymphatics: Denies new lymphadenopathy or easy bruising Neurological:Denies numbness, tingling or new weaknesses Behavioral/Psych: Mood is stable, no new changes  All other systems were reviewed with the patient and are negative.  MEDICAL HISTORY:  Past Medical History:  Diagnosis Date  . Cataracts, bilateral   . Diverticulosis 12/07/2001   found on colonoscopy  . Family history of colon cancer   . Family history of pancreatic cancer   . Hypothyroid 10/2007  . Macular hole   . Pancreatic mass   . PONV (postoperative nausea and vomiting)     SURGICAL HISTORY: Past Surgical History:  Procedure Laterality Date  . BILIARY STENT PLACEMENT  10/10/2019   Procedure: BILIARY STENT PLACEMENT;  Surgeon: Arta Silence, MD;  Location: [place];  Service: Endoscopy;;  . CATARACT EXTRACTION, BILATERAL    . COLONOSCOPY    . ERCP N/A 10/10/2019   Procedure: ENDOSCOPIC RETROGRADE CHOLANGIOPANCREATOGRAPHY (ERCP);  Surgeon: Arta Silence, MD;  Location: [facility] ENDOSCOPY;  Service: Endoscopy;  Laterality: N/A;  . ESOPHAGOGASTRODUODENOSCOPY N/A 10/10/2019   Procedure: ESOPHAGOGASTRODUODENOSCOPY (EGD);  Surgeon: Arta Silence, MD;  Location: [facility] ENDOSCOPY;  Service: Endoscopy;  Laterality: N/A;  . EUS N/A 10/10/2019   Procedure: UPPER ENDOSCOPIC ULTRASOUND (EUS) LINEAR;  Surgeon: Arta Silence, MD;  Location: [place];  Service: Endoscopy;  Laterality: N/A;  . FINE NEEDLE ASPIRATION  10/10/2019   Procedure: FINE NEEDLE ASPIRATION (FNA) LINEAR;  Surgeon: Arta Silence, MD;  Location: [facility] ENDOSCOPY;  Service: Endoscopy;;  . IR IMAGING GUIDED PORT INSERTION  10/25/2019  . Macular hole surg    . PANCREATIC STENT PLACEMENT  10/10/2019   Procedure: PANCREATIC STENT PLACEMENT;  Surgeon:  Arta Silence, MD;  Location: [facility] ENDOSCOPY;  Service: Endoscopy;;  . SPHINCTEROTOMY  10/10/2019   Procedure: SPHINCTEROTOMY;  Surgeon: Arta Silence, MD;  Location: [facility] ENDOSCOPY;  Service: Endoscopy;;  . TONSILLECTOMY      I have reviewed the social history and family history with the patient and they are unchanged from previous note.  ALLERGIES:  has No Known Allergies.  MEDICATIONS:  Current Outpatient Medications  Medication Sig Dispense Refill  . Ascorbic Acid (VITAMIN C PO) Take 1,000 mg by mouth daily.     . B Complex Vitamins (B-COMPLEX/B-12 PO) Take by mouth.    . Calcium Carb-Cholecalciferol (CALCIUM 1000 + D PO) Take by mouth.    . diphenhydrAMINE (BENADRYL) 25 MG tablet Take 25 mg by mouth at bedtime.    . ferrous sulfate 325 (65 FE) MG tablet Take 325 mg by mouth daily with breakfast.    . levothyroxine (SYNTHROID, LEVOTHROID) 50 MCG tablet Take 1 tablet (50 mcg total) by mouth daily. 90 tablet 4  . lidocaine-prilocaine (EMLA) cream Apply to affected area once 30 g 3  . Multiple Vitamin (MULTIVITAMIN PO) Take by mouth.      . mupirocin ointment (BACTROBAN) 2 % Place 1 application into the nose daily as needed (sore in nose).    . ondansetron (ZOFRAN) 4 MG tablet Take 4 mg by mouth every 8 (eight) hours as needed.    . ondansetron (ZOFRAN) 8 MG tablet Take 1 tablet (8 mg total) by mouth 2 (two) times daily as needed. Start on day 3 after chemotherapy. 30 tablet 1  . prochlorperazine (COMPAZINE) 10 MG tablet Take 1 tablet (10 mg total) by mouth every 6 (six) hours as needed (Nausea or vomiting). 30 tablet 1   No current facility-administered medications for this visit.    PHYSICAL EXAMINATION: ECOG PERFORMANCE STATUS: 1 - Symptomatic but completely ambulatory  Vitals:   12/09/19 0857  BP: (!) 113/96  Pulse: 93  Resp: 17  Temp: 98.3 F (36.8 C)  SpO2: 100%   Filed Weights   12/09/19 0857  Weight: 128 lb 3.2 oz (58.2 kg)    Due to COVID19 we will limit  examination to appearance. Patient had no complaints.  GENERAL:alert, no distress and comfortable SKIN: skin color normal, no rashes or significant lesions EYES: normal, Conjunctiva are pink and non-injected, sclera clear  NEURO: alert & oriented x 3 with fluent speech   LABORATORY DATA:  I have reviewed the data as listed CBC Latest Ref Rng & Units 12/09/2019 11/25/2019 11/08/2019  WBC 4.0 - 10.5 K/uL 12.3(H) 11.3(H) 5.2  Hemoglobin 12.0 - 15.0 g/dL 12.6 12.0 11.5(L)  Hematocrit 36 - 46 % 39.3 37.1 35.9(L)  Platelets 150 - 400 K/uL 143(L) 184 250     CMP Latest Ref Rng & Units 12/09/2019 11/25/2019 11/08/2019  Glucose 70 - 99 mg/dL 95 102(H) 105(H)  BUN 6 - 20 mg/dL 9 5(L) 10  Creatinine 0.44 - 1.00 mg/dL 0.63 0.63 0.64  Sodium 135 - 145 mmol/L 138 141 140  Potassium 3.5 - 5.1 mmol/L 4.2 4.1 4.1  Chloride 98 - 111 mmol/L 105 105 105  CO2 22 - 32 mmol/L _0 Calcium 8.9 - 10.3 mg/dL 10.0 9.8 9.3  Total Protein  6.5 - 8.1 g/dL 7.2 6.3(L) 6.5  Total Bilirubin 0.3 - 1.2 mg/dL 0.4 0.7 1.7(H)  Alkaline Phos 38 - 126 U/L 156(H) 114 128(H)  AST 15 - 41 U/L 28 31 54(H)  ALT 0 - 44 U/L 23 28 62(H)      RADIOGRAPHIC STUDIES: I have personally reviewed the radiological images as listed and agreed with the findings in the report. No results found.   ASSESSMENT & PLAN:  AYEZA THERRIAULT is a 59 y.o. female with    1. Pancreatic Cancer, Stage IIB, T2N0 -She was recently diagnosed in 10/2019. Her initial US showed bile duct and gallbladder dilation due to 2-3cm mass at head of pancreas/Uncinate. Her MRI showed her mass is laying close to SMV but not involving it. Her CT CAP from 10/23/19 showed borderline potential adenopathy in the periaortic region which will be followed on restaging scans. There is no definite vascular involvementor distant metastasis. -She underwent EUS and ERCP on 10/10/19 and had 2 stents placed by Dr Paulita Fujita. Herpancreatic mass biopsyshowed Adenocarcinoma of  Pancreas.Endoscopic staging T2N1, noevidence of vascular involvement. -I started her on neoadjuvant chemo with FOLFIRINOX every 2 weeks for 8 cycles over 4 months beginning 11/09/19. Plan for 4 months. If she responds well enough she will proceed with surgery.  -S/p C2 she continues to have mild weight loss and 1 night of nausea after chemo. She is able to recover mostly her week of treatment.  -Labs reviewed, CBC and CMP WNL except WBC 12.3, plt 143K, Alk Phos 156. Overall adequate to proceed with C3 FOLFIRINOX today at same dose.  -f/u in 2 weeks   2. Obstructive Jaundice, s/p stent placement  -resolved now  3. Upper Abdominal Pain, Mild weight loss, Nausea -Presented with initial jaundice but since stent placement her pain has much improved.Her pain has resolved and only occasional.  -Her weight is slowly trending down. I encouraged her to increase protein and calorie in her diet. She plans to take more protein shakes and I encouraged her to take Nutritional supplements in between meals.  -For her nausea she will continue compazine. She can take it earlier on day 1 treatment if needed.  -She will continue to f/u with dietician.    4. Genetic Testing -She has maternal family history of pancreatic and colon cancer. I discussed she is eligible of genetic testing. She proceeded with Genetic testing on 11/18/19 and results are pending.    PLAN: -Labs reviewed and adequate to proceed with C3 FOLFIRINOX today at same dose, with GCSF on day 3  -f/u with dietician  -Lab, flush, f/u and chem in 2 weeks    No problem-specific Assessment & Plan notes found for this encounter.   No orders of the defined types were placed in this encounter.  All questions were answered. The patient knows to call the clinic with any problems, questions or concerns. No barriers to learning was detected. The total time spent in the appointment was 30 minutes.     Truitt Merle, MD 12/09/2019   I, Joslyn Devon, am acting as scribe for Truitt Merle, MD.   I have reviewed the above documentation for accuracy and completeness, and I agree with the above.

## 2019-12-09 ENCOUNTER — Encounter: Payer: Self-pay | Admitting: Hematology

## 2019-12-09 ENCOUNTER — Inpatient Hospital Stay: Payer: BC Managed Care – PPO | Admitting: Nutrition

## 2019-12-09 ENCOUNTER — Inpatient Hospital Stay: Payer: BC Managed Care – PPO | Admitting: Hematology

## 2019-12-09 ENCOUNTER — Inpatient Hospital Stay: Payer: BC Managed Care – PPO

## 2019-12-09 ENCOUNTER — Other Ambulatory Visit: Payer: Self-pay

## 2019-12-09 ENCOUNTER — Inpatient Hospital Stay: Payer: BC Managed Care – PPO | Attending: Hematology

## 2019-12-09 VITALS — BP 119/65

## 2019-12-09 VITALS — BP 113/96 | HR 93 | Temp 98.3°F | Resp 17 | Ht 65.0 in | Wt 128.2 lb

## 2019-12-09 DIAGNOSIS — Z95828 Presence of other vascular implants and grafts: Secondary | ICD-10-CM

## 2019-12-09 DIAGNOSIS — Z5111 Encounter for antineoplastic chemotherapy: Secondary | ICD-10-CM | POA: Diagnosis not present

## 2019-12-09 DIAGNOSIS — Z5112 Encounter for antineoplastic immunotherapy: Secondary | ICD-10-CM | POA: Diagnosis not present

## 2019-12-09 DIAGNOSIS — C257 Malignant neoplasm of other parts of pancreas: Secondary | ICD-10-CM | POA: Insufficient documentation

## 2019-12-09 DIAGNOSIS — K831 Obstruction of bile duct: Secondary | ICD-10-CM | POA: Insufficient documentation

## 2019-12-09 DIAGNOSIS — C189 Malignant neoplasm of colon, unspecified: Secondary | ICD-10-CM | POA: Diagnosis not present

## 2019-12-09 DIAGNOSIS — Z452 Encounter for adjustment and management of vascular access device: Secondary | ICD-10-CM | POA: Insufficient documentation

## 2019-12-09 DIAGNOSIS — C250 Malignant neoplasm of head of pancreas: Secondary | ICD-10-CM | POA: Diagnosis not present

## 2019-12-09 DIAGNOSIS — Z5189 Encounter for other specified aftercare: Secondary | ICD-10-CM | POA: Diagnosis not present

## 2019-12-09 LAB — CBC WITH DIFFERENTIAL (CANCER CENTER ONLY)
Abs Immature Granulocytes: 0.49 10*3/uL — ABNORMAL HIGH (ref 0.00–0.07)
Basophils Absolute: 0.1 10*3/uL (ref 0.0–0.1)
Basophils Relative: 1 %
Eosinophils Absolute: 0.4 10*3/uL (ref 0.0–0.5)
Eosinophils Relative: 3 %
HCT: 39.3 % (ref 36.0–46.0)
Hemoglobin: 12.6 g/dL (ref 12.0–15.0)
Immature Granulocytes: 4 %
Lymphocytes Relative: 23 %
Lymphs Abs: 2.8 10*3/uL (ref 0.7–4.0)
MCH: 29.7 pg (ref 26.0–34.0)
MCHC: 32.1 g/dL (ref 30.0–36.0)
MCV: 92.7 fL (ref 80.0–100.0)
Monocytes Absolute: 1.4 10*3/uL — ABNORMAL HIGH (ref 0.1–1.0)
Monocytes Relative: 12 %
Neutro Abs: 7.2 10*3/uL (ref 1.7–7.7)
Neutrophils Relative %: 57 %
Platelet Count: 143 10*3/uL — ABNORMAL LOW (ref 150–400)
RBC: 4.24 MIL/uL (ref 3.87–5.11)
RDW: 15.3 % (ref 11.5–15.5)
WBC Count: 12.3 10*3/uL — ABNORMAL HIGH (ref 4.0–10.5)
nRBC: 0 % (ref 0.0–0.2)

## 2019-12-09 LAB — CMP (CANCER CENTER ONLY)
ALT: 23 U/L (ref 0–44)
AST: 28 U/L (ref 15–41)
Albumin: 3.7 g/dL (ref 3.5–5.0)
Alkaline Phosphatase: 156 U/L — ABNORMAL HIGH (ref 38–126)
Anion gap: 7 (ref 5–15)
BUN: 9 mg/dL (ref 6–20)
CO2: 26 mmol/L (ref 22–32)
Calcium: 10 mg/dL (ref 8.9–10.3)
Chloride: 105 mmol/L (ref 98–111)
Creatinine: 0.63 mg/dL (ref 0.44–1.00)
GFR, Est AFR Am: >60 mL/min (ref >60)
GFR, Estimated: >60 mL/min (ref >60)
Glucose, Bld: 95 mg/dL (ref 70–99)
Potassium: 4.2 mmol/L (ref 3.5–5.1)
Sodium: 138 mmol/L (ref 135–145)
Total Bilirubin: 0.4 mg/dL (ref 0.3–1.2)
Total Protein: 7.2 g/dL (ref 6.5–8.1)

## 2019-12-09 MED ORDER — DEXTROSE 5 % IV SOLN
Freq: Once | INTRAVENOUS | Status: AC
  Filled 2019-12-09: qty 250

## 2019-12-09 MED ORDER — SODIUM CHLORIDE 0.9 % IV SOLN
400.0000 mg/m2 | Freq: Once | INTRAVENOUS | Status: AC
  Administered 2019-12-09: 660 mg via INTRAVENOUS
  Filled 2019-12-09: qty 33

## 2019-12-09 MED ORDER — ATROPINE SULFATE 1 MG/ML IJ SOLN
0.5000 mg | Freq: Once | INTRAMUSCULAR | Status: AC | PRN
  Administered 2019-12-09: 0.5 mg via INTRAVENOUS

## 2019-12-09 MED ORDER — SODIUM CHLORIDE 0.9 % IV SOLN
10.0000 mg | Freq: Once | INTRAVENOUS | Status: AC
  Administered 2019-12-09: 10 mg via INTRAVENOUS
  Filled 2019-12-09: qty 10

## 2019-12-09 MED ORDER — SODIUM CHLORIDE 0.9% FLUSH
10.0000 mL | INTRAVENOUS | Status: DC | PRN
  Administered 2019-12-09: 10 mL via INTRAVENOUS
  Filled 2019-12-09: qty 10

## 2019-12-09 MED ORDER — OXALIPLATIN CHEMO INJECTION 100 MG/20ML
85.0000 mg/m2 | Freq: Once | INTRAVENOUS | Status: AC
  Administered 2019-12-09: 140 mg via INTRAVENOUS
  Filled 2019-12-09: qty 10

## 2019-12-09 MED ORDER — PALONOSETRON HCL INJECTION 0.25 MG/5ML
INTRAVENOUS | Status: AC
  Filled 2019-12-09: qty 5

## 2019-12-09 MED ORDER — ATROPINE SULFATE 1 MG/ML IJ SOLN
INTRAMUSCULAR | Status: AC
  Filled 2019-12-09: qty 1

## 2019-12-09 MED ORDER — SODIUM CHLORIDE 0.9 % IV SOLN
2400.0000 mg/m2 | INTRAVENOUS | Status: DC
  Administered 2019-12-09: 3950 mg via INTRAVENOUS
  Filled 2019-12-09: qty 79

## 2019-12-09 MED ORDER — PALONOSETRON HCL INJECTION 0.25 MG/5ML
0.2500 mg | Freq: Once | INTRAVENOUS | Status: AC
  Administered 2019-12-09: 0.25 mg via INTRAVENOUS

## 2019-12-09 MED ORDER — SODIUM CHLORIDE 0.9 % IV SOLN
150.0000 mg/m2 | Freq: Once | INTRAVENOUS | Status: AC
  Administered 2019-12-09: 240 mg via INTRAVENOUS
  Filled 2019-12-09: qty 12

## 2019-12-09 MED ORDER — SODIUM CHLORIDE 0.9 % IV SOLN
150.0000 mg | Freq: Once | INTRAVENOUS | Status: AC
  Administered 2019-12-09: 150 mg via INTRAVENOUS
  Filled 2019-12-09: qty 150

## 2019-12-09 NOTE — Progress Notes (Signed)
Nutrition follow-up completed with patient during infusion for pancreas cancer. Patient reports she continues to have nausea. Cold sensitivity seems to be lasting longer. She does not think she has been eating as much meat. Weight decreased and documented as 128.2 pounds on August 9 down from 132.1 pounds July 26.  Nutrition diagnosis: Food and nutrition related knowledge deficit continues.  Intervention: Patient educated to continue strategies for increased calories and protein in small frequent meals and snacks. Provided education on foods to consume with nausea.  Provided fact sheet.  Encouraged nausea medication. Add Ensure Enlive twice daily +1 Premier protein between meals. Provided samples of Ensure Enlive and coupons. Reviewed strategies for increasing calories with cold sensitivity.  Provided recipes.  Monitoring, evaluation, goals: Patient will tolerate increased calories and protein to minimize further weight loss.  Next visit: Monday, August 23 during infusion.  **Disclaimer: This note was dictated with voice recognition software. Similar sounding words can inadvertently be transcribed and this note may contain transcription errors which may not have been corrected upon publication of note.**

## 2019-12-09 NOTE — Patient Instructions (Signed)

## 2019-12-09 NOTE — Progress Notes (Signed)
Patient verbalized she experienced significant nausea the evening of her last treatment. She states she discussed this with Dr. Burr Medico during her visit today. This RN asked patient if she would like to use aromatherapy to potentially assist with this complaint. Patient agreed. This RN provided patient with 2 inhalers (orange ginger and peppermint) and educated her on proper use. Patient verbalized understanding. Patient states she will notify her RN at next infusion if this tx modality was helpful.

## 2019-12-09 NOTE — Patient Instructions (Signed)
[facility] Discharge Instructions for Patients Receiving Chemotherapy  Today you received the following chemotherapy agents oxaliplatin, irinotecan, leucovorin, and fluorouracil.  To help prevent nausea and vomiting after your treatment, we encourage you to take your nausea medication as directed.   If you develop nausea and vomiting that is not controlled by your nausea medication, call the clinic.   BELOW ARE SYMPTOMS THAT SHOULD BE REPORTED IMMEDIATELY:  *FEVER GREATER THAN 100.5 F  *CHILLS WITH OR WITHOUT FEVER  NAUSEA AND VOMITING THAT IS NOT CONTROLLED WITH YOUR NAUSEA MEDICATION  *UNUSUAL SHORTNESS OF BREATH  *UNUSUAL BRUISING OR BLEEDING  TENDERNESS IN MOUTH AND THROAT WITH OR WITHOUT PRESENCE OF ULCERS  *URINARY PROBLEMS  *BOWEL PROBLEMS  UNUSUAL RASH Items with * indicate a potential emergency and should be followed up as soon as possible.  Feel free to call the clinic should you have any questions or concerns. The clinic phone number is [phone].  Please show the CHEMO ALERT CARD at check-in to the Emergency Department and triage nurse.   

## 2019-12-09 NOTE — Progress Notes (Signed)
During irinotecan infusion, patient began c/o "my hands are drawing." States it does not feel like cramping. Infusion paused and Dr. Burr Medico notified via phone. Advised to provide warm packs/blankets to hands and reevaluate in 20 mins. Patient verbalized near-immediate improvement in complaint.

## 2019-12-11 ENCOUNTER — Other Ambulatory Visit: Payer: Self-pay

## 2019-12-11 ENCOUNTER — Telehealth: Payer: Self-pay | Admitting: Hematology

## 2019-12-11 ENCOUNTER — Inpatient Hospital Stay: Payer: BC Managed Care – PPO

## 2019-12-11 VITALS — BP 118/65 | HR 83 | Temp 98.7°F | Resp 18

## 2019-12-11 DIAGNOSIS — Z5189 Encounter for other specified aftercare: Secondary | ICD-10-CM | POA: Diagnosis not present

## 2019-12-11 DIAGNOSIS — C257 Malignant neoplasm of other parts of pancreas: Secondary | ICD-10-CM | POA: Diagnosis not present

## 2019-12-11 DIAGNOSIS — Z5111 Encounter for antineoplastic chemotherapy: Secondary | ICD-10-CM | POA: Diagnosis not present

## 2019-12-11 DIAGNOSIS — C250 Malignant neoplasm of head of pancreas: Secondary | ICD-10-CM

## 2019-12-11 DIAGNOSIS — Z5112 Encounter for antineoplastic immunotherapy: Secondary | ICD-10-CM | POA: Diagnosis not present

## 2019-12-11 DIAGNOSIS — Z452 Encounter for adjustment and management of vascular access device: Secondary | ICD-10-CM | POA: Diagnosis not present

## 2019-12-11 DIAGNOSIS — K831 Obstruction of bile duct: Secondary | ICD-10-CM | POA: Diagnosis not present

## 2019-12-11 MED ORDER — HEPARIN SOD (PORK) LOCK FLUSH 100 UNIT/ML IV SOLN
500.0000 [IU] | Freq: Once | INTRAVENOUS | Status: AC | PRN
  Administered 2019-12-11: 500 [IU]
  Filled 2019-12-11: qty 5

## 2019-12-11 MED ORDER — PEGFILGRASTIM-CBQV 6 MG/0.6ML ~~LOC~~ SOSY
PREFILLED_SYRINGE | SUBCUTANEOUS | Status: AC
  Filled 2019-12-11: qty 0.6

## 2019-12-11 MED ORDER — PEGFILGRASTIM-CBQV 6 MG/0.6ML ~~LOC~~ SOSY
6.0000 mg | PREFILLED_SYRINGE | Freq: Once | SUBCUTANEOUS | Status: AC
  Administered 2019-12-11: 6 mg via SUBCUTANEOUS

## 2019-12-11 MED ORDER — SODIUM CHLORIDE 0.9% FLUSH
10.0000 mL | INTRAVENOUS | Status: DC | PRN
  Administered 2019-12-11: 10 mL
  Filled 2019-12-11: qty 10

## 2019-12-11 NOTE — Telephone Encounter (Signed)
No 8/9 los 

## 2019-12-20 NOTE — Progress Notes (Signed)
[facility]   [phone] Fax:([phone]) [phone]   Clinic Follow up Note   Patient Care Team: Vernie Shanks, MD as PCP - General (Family Medicine) Fontaine, Belinda Block, MD (Inactive) as Consulting Physician (Gynecology) Truitt Merle, MD as Consulting Physician (Oncology) Jonnie Finner, RN as Registered Nurse Arta Silence, MD as Consulting Physician (Gastroenterology) Stark Klein, MD as Consulting Physician (General Surgery) Karie Mainland, RD as Dietitian (Nutrition)  Date of Service:  12/23/2019  CHIEF COMPLAINT: F/u of Pancreatic Cancer  SUMMARY OF ONCOLOGIC HISTORY: Oncology History Overview Note  Cancer Staging Pancreatic cancer [facility]) Staging form: Exocrine Pancreas, AJCC 8th Edition - Clinical stage from 10/16/2019: Stage IIB (cT2, cN1, cM0) - Signed by Truitt Merle, MD on 10/16/2019    Pancreatic cancer ([place])  10/03/2019 Imaging   US Abdomen 10/03/19  IMPRESSION: Markedly dilated bile ducts. Dilated gallbladder with sludge but no gallstones.   2.9 cm mass in the uncinate process most likely pancreatic neoplasm causing biliary obstruction. Recommend MRI of the pancreas without with contrast for further evaluation. If the patient cannot have MRI, CT of the pancreas with contrast recommended.   10/07/2019 Imaging   MRI Abdomen 10/07/19  IMPRESSION: 1. There is a hypoenhancing mass of the pancreatic uncinate with abrupt obstruction of the central common bile duct, measuring approximately 2.9 cm, consistent with pancreatic adenocarcinoma. 2. Gross intra and extrahepatic biliary ductal dilatation, common bile duct measuring 1.4 cm. Gallbladder hydrops. 3. The pancreatic duct is nondilated. 4. Pancreatic mass lies closely adjacent to the most central portions of the superior mesenteric vein and central portal vein, due to motion artifact it is difficult to clearly discern whether there is a preserved fat plane. Multiphasic contrast  enhanced pancreatic protocol CT may be less motion sensitive and better delineate vascular structures for the purposes of surgical planning if necessary. 5. No evidence of lymphadenopathy or metastatic disease in the abdomen.   10/10/2019 Procedure   EUS by Dr Paulita Fujita 10/10/19  IMPRESSION - A mass was identified at junction of head/uncinate process of the pancreas. This was staged T2 N1 Mx by endosonographic criteria. Fine needle aspiration performed. - A few abnormal lymph nodes were visualized in the peripancreatic region. - There was dilation in the common bile duct which measured up to 14 mm. - Hyperechoic material consistent with sludge was visualized endosonographically in the gallbladder.   10/10/2019 Procedure   ERCP by Dr Paulita Fujita 10/10/19  IMPRESSION - The major papilla appeared normal. - One temporary stent was placed into the ventral pancreatic duct. - A biliary sphincterotomy was performed. - One covered metal stent was placed into the common bile duct.   10/10/2019 Initial Biopsy   FINAL MICROSCOPIC DIAGNOSIS: 10/10/19  A. PANCREAS, HEAD, FINE NEEDLE ASPIRATION:  - Malignant cells present, consistent with adenocarcinoma    10/16/2019 Initial Diagnosis   Pancreatic cancer ([place])   10/16/2019 Cancer Staging   Staging form: Exocrine Pancreas, AJCC 8th Edition - Clinical stage from 10/16/2019: Stage IIB (cT2, cN1, cM0) - Signed by Truitt Merle, MD on 10/16/2019   10/23/2019 Imaging   CT CAP W contrast  IMPRESSION: 1. Pancreatic mass with local adenopathy and potential retroperitoneal adenopathy. Given retroperitoneal lymph nodes and presence of venous collaterals PET scan may be useful for staging purposes. 2. No definite vascular involvement or signs of upper abdominal venous collaterals. Some stranding around the celiac may be due to recent biopsy. 3. Borderline enlarged RIGHT external iliac lymph node. Attention on follow-up. 4. No evidence  of metastatic disease in the  chest. 5. Signs of recent ERCP and stent placement.   10/25/2019 Procedure   PAC placed    11/09/2019 -  Chemotherapy   Neoadjuvant FOLFIRINOX q2weeks starting 11/09/19 for 8 cycles   11/28/2019 Genetic Testing   Negative genetic testing on the common hereditary cancer panel, pancreatic cancer panel, preliminary evidence pancreatic cancer panel and chronic pancreatitis panel.  The Common Hereditary Gene Panel offered by [facility] includes sequencing and/or deletion duplication testing of the following 55 genes: APC*, ATM*, AXIN2, BARD1, BMPR1A, BRCA1, BRCA2, BRIP1, CASR, CDH1, CDK4, CDKN2A (p14ARF), CDKN2A (p16INK4a), CFTR*, CHEK2, CPA1, CTNNA1, CTRC, DICER1*, EPCAM*, FANCC, GREM1*, HOXB13, KIT, MEN1*, MLH1*, MSH2*, MSH3*, MSH6*, MUTYH, NBN, NF1*, NTHL1, PALB2, PALLD, PDGFRA, PMS2*, POLD1*, POLE, PRSS1*, PTEN*, RAD50, RAD51C, RAD51D, SDHA*, SDHB, SDHC*, SDHD, SMAD4, SMARCA4, SPINK1, STK11, TP53, TSC1*, TSC2, and VHL .  The report date is November 28, 2019.      CURRENT THERAPY:  Neoadjuvant FOLFIRINOX q2weeks starting 11/09/19 for 8 cycles  INTERVAL HISTORY:  Breanna Noble is here for a follow up and treatment. She presents to the clinic alone. She notes she is doing well. She notes she tolerated last cycle chemo well. She notes her nausea was better tolerable. She is able to eat adequately and BM regular. She is able to maintain weight with nutritional supplement. She notes with last cycle she had fever at 101F on day 2. She notes this resolved same day. Her cold sensitivity has lasted longer this past cycle, about 1 week. She will f/u with Dr Barry Dienes on 01/20/20.     REVIEW OF SYSTEMS:   Constitutional: Denies fevers, chills or abnormal weight loss Eyes: Denies blurriness of vision Ears, nose, mouth, throat, and face: Denies mucositis or sore throat Respiratory: Denies cough, dyspnea or wheezes Cardiovascular: Denies palpitation, chest discomfort or lower extremity swelling Gastrointestinal:  Denies  nausea, heartburn or change in bowel habits Skin: Denies abnormal skin rashes Lymphatics: Denies new lymphadenopathy or easy bruising Neurological:Denies numbness, tingling or new weaknesses Behavioral/Psych: Mood is stable, no new changes  All other systems were reviewed with the patient and are negative.  MEDICAL HISTORY:  Past Medical History:  Diagnosis Date  . Cataracts, bilateral   . Diverticulosis 12/07/2001   found on colonoscopy  . Family history of colon cancer   . Family history of pancreatic cancer   . Hypothyroid 10/2007  . Macular hole   . Pancreatic mass   . PONV (postoperative nausea and vomiting)     SURGICAL HISTORY: Past Surgical History:  Procedure Laterality Date  . BILIARY STENT PLACEMENT  10/10/2019   Procedure: BILIARY STENT PLACEMENT;  Surgeon: Arta Silence, MD;  Location: [facility];  Service: Endoscopy;;  . CATARACT EXTRACTION, BILATERAL    . COLONOSCOPY    . ERCP N/A 10/10/2019   Procedure: ENDOSCOPIC RETROGRADE CHOLANGIOPANCREATOGRAPHY (ERCP);  Surgeon: Arta Silence, MD;  Location: [facility] ENDOSCOPY;  Service: Endoscopy;  Laterality: N/A;  . ESOPHAGOGASTRODUODENOSCOPY N/A 10/10/2019   Procedure: ESOPHAGOGASTRODUODENOSCOPY (EGD);  Surgeon: Arta Silence, MD;  Location[facility]COPY;  Service: Endoscopy;  Laterality: N/A;  . EUS N/A 10/10/2019   Procedure: UPPER ENDOSCOPIC ULTRASOUND (EUS) LINEAR;  Surgeon: Arta Silence, MD;  Location: [place];  Service: Endoscopy;  Laterality: N/A;  . FINE NEEDLE ASPIRATION  10/10/2019   Procedure: FINE NEEDLE ASPIRATION (FNA) LINEAR;  Surgeon: Arta Silence, MD;  Location: [place];  Service: Endoscopy;;  . IR IMAGING GUIDED PORT INSERTION  10/25/2019  . Macular hole surg    .  PANCREATIC STENT PLACEMENT  10/10/2019   Procedure: PANCREATIC STENT PLACEMENT;  Surgeon: Arta Silence, MD;  Location: [place] ENDOSCOPY;  Service: Endoscopy;;  . SPHINCTEROTOMY  10/10/2019   Procedure: SPHINCTEROTOMY;  Surgeon:  Arta Silence, MD;  Location: [facility] ENDOSCOPY;  Service: Endoscopy;;  . TONSILLECTOMY      I have reviewed the social history and family history with the patient and they are unchanged from previous note.  ALLERGIES:  has No Known Allergies.  MEDICATIONS:  Current Outpatient Medications  Medication Sig Dispense Refill  . Ascorbic Acid (VITAMIN C PO) Take 1,000 mg by mouth daily.     . B Complex Vitamins (B-COMPLEX/B-12 PO) Take by mouth.    . Calcium Carb-Cholecalciferol (CALCIUM 1000 + D PO) Take by mouth.    . diphenhydrAMINE (BENADRYL) 25 MG tablet Take 25 mg by mouth at bedtime.    . ferrous sulfate 325 (65 FE) MG tablet Take 325 mg by mouth daily with breakfast.    . levothyroxine (SYNTHROID, LEVOTHROID) 50 MCG tablet Take 1 tablet (50 mcg total) by mouth daily. 90 tablet 4  . lidocaine-prilocaine (EMLA) cream Apply to affected area once 30 g 3  . Multiple Vitamin (MULTIVITAMIN PO) Take by mouth.      . mupirocin ointment (BACTROBAN) 2 % Place 1 application into the nose daily as needed (sore in nose).    . ondansetron (ZOFRAN) 4 MG tablet Take 4 mg by mouth every 8 (eight) hours as needed.    . ondansetron (ZOFRAN) 8 MG tablet Take 1 tablet (8 mg total) by mouth 2 (two) times daily as needed. Start on day 3 after chemotherapy. 30 tablet 1  . prochlorperazine (COMPAZINE) 10 MG tablet Take 1 tablet (10 mg total) by mouth every 6 (six) hours as needed (Nausea or vomiting). 30 tablet 1   No current facility-administered medications for this visit.    PHYSICAL EXAMINATION: ECOG PERFORMANCE STATUS: 1 - Symptomatic but completely ambulatory  Vitals:   12/23/19 0853  BP: 123/63  Pulse: 84  Resp: 17  Temp: (!) 97.2 F (36.2 C)  SpO2: 100%   Filed Weights   12/23/19 0853  Weight: 130 lb 3.2 oz (59.1 kg)    Due to COVID19 we will limit examination to appearance. Patient had no complaints.  GENERAL:alert, no distress and comfortable SKIN: skin color normal, no rashes or  significant lesions EYES: normal, Conjunctiva are pink and non-injected, sclera clear  NEURO: alert & oriented x 3 with fluent speech   LABORATORY DATA:  I have reviewed the data as listed CBC Latest Ref Rng & Units 12/23/2019 12/09/2019 11/25/2019  WBC 4.0 - 10.5 K/uL 12.3(H) 12.3(H) 11.3(H)  Hemoglobin 12.0 - 15.0 g/dL 12.2 12.6 12.0  Hematocrit 36 - 46 % 38.4 39.3 37.1  Platelets 150 - 400 K/uL 147(L) 143(L) 184     CMP Latest Ref Rng & Units 12/09/2019 11/25/2019 11/08/2019  Glucose 70 - 99 mg/dL 95 102(H) 105(H)  BUN 6 - 20 mg/dL 9 5(L) 10  Creatinine 0.44 - 1.00 mg/dL 0.63 0.63 0.64  Sodium 135 - 145 mmol/L 138 141 140  Potassium 3.5 - 5.1 mmol/L 4.2 4.1 4.1  Chloride 98 - 111 mmol/L 105 105 105  CO2 22 - 32 mmol/L 26 26 26   Calcium 8.9 - 10.3 mg/dL 10.0 9.8 9.3  Total Protein 6.5 - 8.1 g/dL 7.2 6.3(L) 6.5  Total Bilirubin 0.3 - 1.2 mg/dL 0.4 0.7 1.7(H)  Alkaline Phos 38 - 126 U/L 156(H) 114 128(H)  AST  15 - 41 U/L 28 31 54(H)  ALT 0 - 44 U/L 23 28 62(H)      RADIOGRAPHIC STUDIES: I have personally reviewed the radiological images as listed and agreed with the findings in the report. No results found.   ASSESSMENT & PLAN:  Breanna Noble is a 59 y.o. female with    1. Pancreatic Cancer, Stage IIB, T2N0 -She was recently diagnosed in 10/2019.Her initialUS showed bile duct and gallbladder dilation due to 2-3cm mass at head of pancreas/Uncinate. Her MRI showed her mass is laying close to SMV but not involving it. Her CT CAP from 10/23/19 showedborderline potential adenopathy in the periaortic region which will be followed on restaging scans. There is no definite vascular involvementor distant metastasis. -She underwent EUS and ERCP on 10/10/19 and had 2 stents placed by Dr Paulita Fujita. Herpancreatic mass biopsyshowed Adenocarcinoma of Pancreas.Endoscopic staging T2N1, noevidence of vascular involvement. -I started her onneoadjuvant chemo with FOLFIRINOX every 2 weeks for 8 cycles  over 4 monthsbeginning 11/09/19.If she responds well enough she will proceed with surgery. -S/p C3 she is tolerating well with controlled nausea and cold sensitivity lasting longer. No neuropathy at this time.  -Labs reviewed and adequate to proceed with C4 FOLFIRINOX today .  -Plans to scan her again after cycle 5 or 6, depends on her CA19.9  -F/u in 2 weeks before C5  2. Obstructive Jaundice, s/p 2 stent placements, now resolved -I encouraged her to watch for signs of Jaundice, fever or RUQ pain.   3. Upper Abdominal Pain, Mild weight loss, Nausea -Presented with initial jaundice but since stent placement her pain has much improved.Her pain has resolved and only occasional.  -S/p C3 chemo her nausea is controlled on Compazine. Her weight is maintained on nutritional supplement and adequate eating.  -She will continue to f/u with dietician.   4. Genetic Testing -She has maternal family history of pancreatic and colon cancer. I discussed she is eligible of genetic testing.She proceeded with Genetic testing on 11/18/19 and results are pending.   PLAN: -Labs reviewed and adequate to proceed with C4 FOLFIRINOX todayat same dose, with GCSF on day 3 -f/u with dietician  -Lab, flush, f/uand chem in 2, 4, 6 weeks -will order CT AP pancreatic protocol on next visit    No problem-specific Assessment & Plan notes found for this encounter.   No orders of the defined types were placed in this encounter.  All questions were answered. The patient knows to call the clinic with any problems, questions or concerns. No barriers to learning was detected. The total time spent in the appointment was 30 minutes.     Truitt Merle, MD 12/23/2019   I, Joslyn Devon, am acting as scribe for Truitt Merle, MD.   I have reviewed the above documentation for accuracy and completeness, and I agree with the above.

## 2019-12-23 ENCOUNTER — Inpatient Hospital Stay: Payer: BC Managed Care – PPO | Admitting: Hematology

## 2019-12-23 ENCOUNTER — Inpatient Hospital Stay: Payer: BC Managed Care – PPO

## 2019-12-23 ENCOUNTER — Other Ambulatory Visit: Payer: Self-pay

## 2019-12-23 ENCOUNTER — Inpatient Hospital Stay: Payer: BC Managed Care – PPO | Admitting: Nutrition

## 2019-12-23 ENCOUNTER — Encounter: Payer: Self-pay | Admitting: Hematology

## 2019-12-23 VITALS — BP 123/63 | HR 84 | Temp 97.2°F | Resp 17 | Ht 65.0 in | Wt 130.2 lb

## 2019-12-23 DIAGNOSIS — C257 Malignant neoplasm of other parts of pancreas: Secondary | ICD-10-CM | POA: Diagnosis not present

## 2019-12-23 DIAGNOSIS — C250 Malignant neoplasm of head of pancreas: Secondary | ICD-10-CM

## 2019-12-23 DIAGNOSIS — Z5111 Encounter for antineoplastic chemotherapy: Secondary | ICD-10-CM | POA: Diagnosis not present

## 2019-12-23 DIAGNOSIS — Z5189 Encounter for other specified aftercare: Secondary | ICD-10-CM | POA: Diagnosis not present

## 2019-12-23 DIAGNOSIS — Z95828 Presence of other vascular implants and grafts: Secondary | ICD-10-CM

## 2019-12-23 DIAGNOSIS — Z5112 Encounter for antineoplastic immunotherapy: Secondary | ICD-10-CM | POA: Diagnosis not present

## 2019-12-23 DIAGNOSIS — Z452 Encounter for adjustment and management of vascular access device: Secondary | ICD-10-CM | POA: Diagnosis not present

## 2019-12-23 DIAGNOSIS — C189 Malignant neoplasm of colon, unspecified: Secondary | ICD-10-CM | POA: Diagnosis not present

## 2019-12-23 DIAGNOSIS — K831 Obstruction of bile duct: Secondary | ICD-10-CM | POA: Diagnosis not present

## 2019-12-23 LAB — CMP (CANCER CENTER ONLY)
ALT: 23 U/L (ref 0–44)
AST: 30 U/L (ref 15–41)
Albumin: 3.6 g/dL (ref 3.5–5.0)
Alkaline Phosphatase: 176 U/L — ABNORMAL HIGH (ref 38–126)
Anion gap: 9 (ref 5–15)
BUN: 9 mg/dL (ref 6–20)
CO2: 25 mmol/L (ref 22–32)
Calcium: 9.8 mg/dL (ref 8.9–10.3)
Chloride: 104 mmol/L (ref 98–111)
Creatinine: 0.62 mg/dL (ref 0.44–1.00)
GFR, Est AFR Am: >60 mL/min (ref >60)
GFR, Estimated: >60 mL/min (ref >60)
Glucose, Bld: 102 mg/dL — ABNORMAL HIGH (ref 70–99)
Potassium: 4.2 mmol/L (ref 3.5–5.1)
Sodium: 138 mmol/L (ref 135–145)
Total Bilirubin: 0.3 mg/dL (ref 0.3–1.2)
Total Protein: 7.2 g/dL (ref 6.5–8.1)

## 2019-12-23 LAB — CBC WITH DIFFERENTIAL (CANCER CENTER ONLY)
Abs Immature Granulocytes: 0.53 10*3/uL — ABNORMAL HIGH (ref 0.00–0.07)
Basophils Absolute: 0.1 10*3/uL (ref 0.0–0.1)
Basophils Relative: 0 %
Eosinophils Absolute: 0.1 10*3/uL (ref 0.0–0.5)
Eosinophils Relative: 1 %
HCT: 38.4 % (ref 36.0–46.0)
Hemoglobin: 12.2 g/dL (ref 12.0–15.0)
Immature Granulocytes: 4 %
Lymphocytes Relative: 22 %
Lymphs Abs: 2.8 10*3/uL (ref 0.7–4.0)
MCH: 29.4 pg (ref 26.0–34.0)
MCHC: 31.8 g/dL (ref 30.0–36.0)
MCV: 92.5 fL (ref 80.0–100.0)
Monocytes Absolute: 1.9 10*3/uL — ABNORMAL HIGH (ref 0.1–1.0)
Monocytes Relative: 16 %
Neutro Abs: 6.9 10*3/uL (ref 1.7–7.7)
Neutrophils Relative %: 57 %
Platelet Count: 147 10*3/uL — ABNORMAL LOW (ref 150–400)
RBC: 4.15 MIL/uL (ref 3.87–5.11)
RDW: 15.6 % — ABNORMAL HIGH (ref 11.5–15.5)
WBC Count: 12.3 10*3/uL — ABNORMAL HIGH (ref 4.0–10.5)
nRBC: 0 % (ref 0.0–0.2)

## 2019-12-23 MED ORDER — SODIUM CHLORIDE 0.9 % IV SOLN
10.0000 mg | Freq: Once | INTRAVENOUS | Status: AC
  Administered 2019-12-23: 10 mg via INTRAVENOUS
  Filled 2019-12-23: qty 10

## 2019-12-23 MED ORDER — OXALIPLATIN CHEMO INJECTION 100 MG/20ML
85.0000 mg/m2 | Freq: Once | INTRAVENOUS | Status: AC
  Administered 2019-12-23: 140 mg via INTRAVENOUS
  Filled 2019-12-23: qty 20

## 2019-12-23 MED ORDER — PALONOSETRON HCL INJECTION 0.25 MG/5ML
0.2500 mg | Freq: Once | INTRAVENOUS | Status: AC
  Administered 2019-12-23: 0.25 mg via INTRAVENOUS

## 2019-12-23 MED ORDER — SODIUM CHLORIDE 0.9% FLUSH
10.0000 mL | INTRAVENOUS | Status: DC | PRN
  Administered 2019-12-23: 10 mL via INTRAVENOUS
  Filled 2019-12-23: qty 10

## 2019-12-23 MED ORDER — ATROPINE SULFATE 1 MG/ML IJ SOLN
INTRAMUSCULAR | Status: AC
  Filled 2019-12-23: qty 1

## 2019-12-23 MED ORDER — DEXTROSE 5 % IV SOLN
Freq: Once | INTRAVENOUS | Status: AC
  Filled 2019-12-23: qty 250

## 2019-12-23 MED ORDER — SODIUM CHLORIDE 0.9 % IV SOLN
150.0000 mg | Freq: Once | INTRAVENOUS | Status: AC
  Administered 2019-12-23: 150 mg via INTRAVENOUS
  Filled 2019-12-23: qty 150

## 2019-12-23 MED ORDER — SODIUM CHLORIDE 0.9 % IV SOLN
150.0000 mg/m2 | Freq: Once | INTRAVENOUS | Status: AC
  Administered 2019-12-23: 240 mg via INTRAVENOUS
  Filled 2019-12-23: qty 12

## 2019-12-23 MED ORDER — SODIUM CHLORIDE 0.9 % IV SOLN
2400.0000 mg/m2 | INTRAVENOUS | Status: DC
  Administered 2019-12-23: 3950 mg via INTRAVENOUS
  Filled 2019-12-23: qty 79

## 2019-12-23 MED ORDER — SODIUM CHLORIDE 0.9 % IV SOLN
400.0000 mg/m2 | Freq: Once | INTRAVENOUS | Status: AC
  Administered 2019-12-23: 660 mg via INTRAVENOUS
  Filled 2019-12-23: qty 33

## 2019-12-23 MED ORDER — PALONOSETRON HCL INJECTION 0.25 MG/5ML
INTRAVENOUS | Status: AC
  Filled 2019-12-23: qty 5

## 2019-12-23 MED ORDER — ATROPINE SULFATE 1 MG/ML IJ SOLN
0.5000 mg | Freq: Once | INTRAMUSCULAR | Status: AC | PRN
  Administered 2019-12-23: 0.5 mg via INTRAVENOUS

## 2019-12-23 NOTE — Progress Notes (Signed)
Nutrition follow-up completed with patient during infusion for pancreas cancer. Weight improved to 130.2 pounds August 23 increased from 128.2 pounds August 9. Patient has incorporated some meat but is willing to try fish and some other vegetarian protein sources. She is drinking 2 boost +1 Premier protein between meals. She denies other nutrition impact symptoms.  Nutrition diagnosis: Food and nutrition related knowledge deficit improved.  Intervention: Patient educated to continue strategies for increased calories and protein to minimize weight loss. Continue 3 oral nutrition supplements daily.  Monitoring, evaluation, goals: Patient will tolerate increased calories and protein to minimize weight loss.  Next visit: To be scheduled as needed.  Patient has my contact information for questions or concerns.  Please refer back to RD if nutrition needs are identified before follow-up can be completed.  **Disclaimer: This note was dictated with voice recognition software. Similar sounding words can inadvertently be transcribed and this note may contain transcription errors which may not have been corrected upon publication of note.**

## 2019-12-23 NOTE — Progress Notes (Signed)
During Oxaliplatin infusion, pt began to c/o of nausea. Saltine crackers offered and pt took home supply of Compazine. Pt reported feeling relief. Will continue to monitor.

## 2019-12-23 NOTE — Patient Instructions (Signed)
[facility] Discharge Instructions for Patients Receiving Chemotherapy  Today you received the following chemotherapy agents Oxaliplatin, Leucovorin, Irinotecan and Adrucil  To help prevent nausea and vomiting after your treatment, we encourage you to take your nausea medication as directed.   If you develop nausea and vomiting that is not controlled by your nausea medication, call the clinic.   BELOW ARE SYMPTOMS THAT SHOULD BE REPORTED IMMEDIATELY:  *FEVER GREATER THAN 100.5 F  *CHILLS WITH OR WITHOUT FEVER  NAUSEA AND VOMITING THAT IS NOT CONTROLLED WITH YOUR NAUSEA MEDICATION  *UNUSUAL SHORTNESS OF BREATH  *UNUSUAL BRUISING OR BLEEDING  TENDERNESS IN MOUTH AND THROAT WITH OR WITHOUT PRESENCE OF ULCERS  *URINARY PROBLEMS  *BOWEL PROBLEMS  UNUSUAL RASH Items with * indicate a potential emergency and should be followed up as soon as possible.  Feel free to call the clinic should you have any questions or concerns. The clinic phone number is [phone].  Please show the [facility] at check-in to the Emergency Department and triage nurse.

## 2019-12-24 LAB — CANCER ANTIGEN 19-9: CA 19-9: 274 U/mL — ABNORMAL HIGH (ref 0–35)

## 2019-12-25 ENCOUNTER — Inpatient Hospital Stay: Payer: BC Managed Care – PPO

## 2019-12-25 ENCOUNTER — Other Ambulatory Visit: Payer: Self-pay

## 2019-12-25 ENCOUNTER — Telehealth: Payer: Self-pay | Admitting: Hematology

## 2019-12-25 VITALS — BP 122/68 | HR 78 | Temp 98.6°F | Resp 16

## 2019-12-25 DIAGNOSIS — Z5112 Encounter for antineoplastic immunotherapy: Secondary | ICD-10-CM | POA: Diagnosis not present

## 2019-12-25 DIAGNOSIS — Z5189 Encounter for other specified aftercare: Secondary | ICD-10-CM | POA: Diagnosis not present

## 2019-12-25 DIAGNOSIS — Z5111 Encounter for antineoplastic chemotherapy: Secondary | ICD-10-CM | POA: Diagnosis not present

## 2019-12-25 DIAGNOSIS — C250 Malignant neoplasm of head of pancreas: Secondary | ICD-10-CM

## 2019-12-25 DIAGNOSIS — Z452 Encounter for adjustment and management of vascular access device: Secondary | ICD-10-CM | POA: Diagnosis not present

## 2019-12-25 DIAGNOSIS — C257 Malignant neoplasm of other parts of pancreas: Secondary | ICD-10-CM | POA: Diagnosis not present

## 2019-12-25 DIAGNOSIS — K831 Obstruction of bile duct: Secondary | ICD-10-CM | POA: Diagnosis not present

## 2019-12-25 MED ORDER — PEGFILGRASTIM-CBQV 6 MG/0.6ML ~~LOC~~ SOSY
6.0000 mg | PREFILLED_SYRINGE | Freq: Once | SUBCUTANEOUS | Status: AC
  Administered 2019-12-25: 6 mg via SUBCUTANEOUS

## 2019-12-25 MED ORDER — HEPARIN SOD (PORK) LOCK FLUSH 100 UNIT/ML IV SOLN
500.0000 [IU] | Freq: Once | INTRAVENOUS | Status: AC | PRN
  Administered 2019-12-25: 500 [IU]
  Filled 2019-12-25: qty 5

## 2019-12-25 MED ORDER — SODIUM CHLORIDE 0.9% FLUSH
10.0000 mL | INTRAVENOUS | Status: DC | PRN
  Administered 2019-12-25: 10 mL
  Filled 2019-12-25: qty 10

## 2019-12-25 MED ORDER — PEGFILGRASTIM-CBQV 6 MG/0.6ML ~~LOC~~ SOSY
PREFILLED_SYRINGE | SUBCUTANEOUS | Status: AC
  Filled 2019-12-25: qty 0.6

## 2019-12-25 NOTE — Telephone Encounter (Signed)
No 8/23 los

## 2019-12-25 NOTE — Patient Instructions (Signed)
Pegfilgrastim injection What is this medicine? PEGFILGRASTIM (PEG fil gra stim) is a long-acting granulocyte colony-stimulating factor that stimulates the growth of neutrophils, a type of white blood cell important in the body's fight against infection. It is used to reduce the incidence of fever and infection in patients with certain types of cancer who are receiving chemotherapy that affects the bone marrow, and to increase survival after being exposed to high doses of radiation. This medicine may be used for other purposes; ask your health care provider or pharmacist if you have questions. COMMON BRAND NAME(S): Fulphila, Neulasta, UDENYCA, Ziextenzo What should I tell my health care provider before I take this medicine? They need to know if you have any of these conditions:  kidney disease  latex allergy  ongoing radiation therapy  sickle cell disease  skin reactions to acrylic adhesives (On-Body Injector only)  an unusual or allergic reaction to pegfilgrastim, filgrastim, other medicines, foods, dyes, or preservatives  pregnant or trying to get pregnant  breast-feeding How should I use this medicine? This medicine is for injection under the skin. If you get this medicine at home, you will be taught how to prepare and give the pre-filled syringe or how to use the On-body Injector. Refer to the patient Instructions for Use for detailed instructions. Use exactly as directed. Tell your healthcare provider immediately if you suspect that the On-body Injector may not have performed as intended or if you suspect the use of the On-body Injector resulted in a missed or partial dose. It is important that you put your used needles and syringes in a special sharps container. Do not put them in a trash can. If you do not have a sharps container, call your pharmacist or healthcare provider to get one. Talk to your pediatrician regarding the use of this medicine in children. While this drug may be  prescribed for selected conditions, precautions do apply. Overdosage: If you think you have taken too much of this medicine contact a poison control center or emergency room at once. NOTE: This medicine is only for you. Do not share this medicine with others. What if I miss a dose? It is important not to miss your dose. Call your doctor or health care professional if you miss your dose. If you miss a dose due to an On-body Injector failure or leakage, a new dose should be administered as soon as possible using a single prefilled syringe for manual use. What may interact with this medicine? Interactions have not been studied. Give your health care provider a list of all the medicines, herbs, non-prescription drugs, or dietary supplements you use. Also tell them if you smoke, drink alcohol, or use illegal drugs. Some items may interact with your medicine. This list may not describe all possible interactions. Give your health care provider a list of all the medicines, herbs, non-prescription drugs, or dietary supplements you use. Also tell them if you smoke, drink alcohol, or use illegal drugs. Some items may interact with your medicine. What should I watch for while using this medicine? You may need blood work done while you are taking this medicine. If you are going to need a MRI, CT scan, or other procedure, tell your doctor that you are using this medicine (On-Body Injector only). What side effects may I notice from receiving this medicine? Side effects that you should report to your doctor or health care professional as soon as possible:  allergic reactions like skin rash, itching or hives, swelling of the   face, lips, or tongue  back pain  dizziness  fever  pain, redness, or irritation at site where injected  pinpoint red spots on the skin  red or dark-brown urine  shortness of breath or breathing problems  stomach or side pain, or pain at the  shoulder  swelling  tiredness  trouble passing urine or change in the amount of urine Side effects that usually do not require medical attention (report to your doctor or health care professional if they continue or are bothersome):  bone pain  muscle pain This list may not describe all possible side effects. Call your doctor for medical advice about side effects. You may report side effects to FDA at 1-800-FDA-1088. Where should I keep my medicine? Keep out of the reach of children. If you are using this medicine at home, you will be instructed on how to store it. Throw away any unused medicine after the expiration date on the label. NOTE: This sheet is a summary. It may not cover all possible information. If you have questions about this medicine, talk to your doctor, pharmacist, or health care provider.  2020 [employer] Standard (2017-07-24 16:57:08) Implanted Port Home Guide An implanted port is a device that is placed under the skin. It is usually placed in the chest. The device can be used to give IV medicine, to take blood, or for dialysis. You may have an implanted port if:  You need IV medicine that would be irritating to the small veins in your hands or arms.  You need IV medicines, such as antibiotics, for a long period of time.  You need IV nutrition for a long period of time.  You need dialysis. Having a port means that your health care provider will not need to use the veins in your arms for these procedures. You may have fewer limitations when using a port than you would if you used other types of long-term IVs, and you will likely be able to return to normal activities after your incision heals. An implanted port has two main parts:  Reservoir. The reservoir is the part where a needle is inserted to give medicines or draw blood. The reservoir is round. After it is placed, it appears as a small, raised area under your skin.  Catheter. The catheter is a thin,  flexible tube that connects the reservoir to a vein. Medicine that is inserted into the reservoir goes into the catheter and then into the vein. How is my port accessed? To access your port:  A numbing cream may be placed on the skin over the port site.  Your health care provider will put on a mask and sterile gloves.  The skin over your port will be cleaned carefully with a germ-killing soap and allowed to dry.  Your health care provider will gently pinch the port and insert a needle into it.  Your health care provider will check for a blood return to make sure the port is in the vein and is not clogged.  If your port needs to remain accessed to get medicine continuously (constant infusion), your health care provider will place a clear bandage (dressing) over the needle site. The dressing and needle will need to be changed every week, or as told by your health care provider. What is flushing? Flushing helps keep the port from getting clogged. Follow instructions from your health care provider about how and when to flush the port. Ports are usually flushed with saline solution or a medicine   called heparin. The need for flushing will depend on how the port is used:  If the port is only used from time to time to give medicines or draw blood, the port may need to be flushed: ? Before and after medicines have been given. ? Before and after blood has been drawn. ? As part of routine maintenance. Flushing may be recommended every 4-6 weeks.  If a constant infusion is running, the port may not need to be flushed.  Throw away any syringes in a disposal container that is meant for sharp items (sharps container). You can buy a sharps container from a pharmacy, or you can make one by using an empty hard plastic bottle with a cover. How long will my port stay implanted? The port can stay in for as long as your health care provider thinks it is needed. When it is time for the port to come out, a  surgery will be done to remove it. The surgery will be similar to the procedure that was done to put the port in. Follow these instructions at home:   Flush your port as told by your health care provider.  If you need an infusion over several days, follow instructions from your health care provider about how to take care of your port site. Make sure you: ? Wash your hands with soap and water before you change your dressing. If soap and water are not available, use alcohol-based hand sanitizer. ? Change your dressing as told by your health care provider. ? Place any used dressings or infusion bags into a plastic bag. Throw that bag in the trash. ? Keep the dressing that covers the needle clean and dry. Do not get it wet. ? Do not use scissors or sharp objects near the tube. ? Keep the tube clamped, unless it is being used.  Check your port site every day for signs of infection. Check for: ? Redness, swelling, or pain. ? Fluid or blood. ? Pus or a bad smell.  Protect the skin around the port site. ? Avoid wearing bra straps that rub or irritate the site. ? Protect the skin around your port from seat belts. Place a soft pad over your chest if needed.  Bathe or shower as told by your health care provider. The site may get wet as long as you are not actively receiving an infusion.  Return to your normal activities as told by your health care provider. Ask your health care provider what activities are safe for you.  Carry a medical alert card or wear a medical alert bracelet at all times. This will let health care providers know that you have an implanted port in case of an emergency. Get help right away if:  You have redness, swelling, or pain at the port site.  You have fluid or blood coming from your port site.  You have pus or a bad smell coming from the port site.  You have a fever. Summary  Implanted ports are usually placed in the chest for long-term IV access.  Follow  instructions from your health care provider about flushing the port and changing bandages (dressings).  Take care of the area around your port by avoiding clothing that puts pressure on the area, and by watching for signs of infection.  Protect the skin around your port from seat belts. Place a soft pad over your chest if needed.  Get help right away if you have a fever or you have redness,   swelling, pain, drainage, or a bad smell at the port site. This information is not intended to replace advice given to you by your health care provider. Make sure you discuss any questions you have with your health care provider. Document Revised: 08/10/2018 Document Reviewed: 05/21/2016 Elsevier Patient Education  2020 [employer]  

## 2019-12-26 DIAGNOSIS — C189 Malignant neoplasm of colon, unspecified: Secondary | ICD-10-CM | POA: Diagnosis not present

## 2020-01-06 NOTE — Progress Notes (Signed)
[place]   [phone] [phone]   Clinic Follow up Note   Patient Care Team: Vernie Shanks, MD as PCP - General (Family Medicine) Fontaine, Belinda Block, MD (Inactive) as Consulting Physician (Gynecology) Truitt Merle, MD as Consulting Physician (Oncology) Jonnie Finner, RN as Registered Nurse Arta Silence, MD as Consulting Physician (Gastroenterology) Stark Klein, MD as Consulting Physician (General Surgery) Karie Mainland, RD as Dietitian (Nutrition) 01/07/2020  CHIEF COMPLAINT: F/u pancreas cancer   SUMMARY OF ONCOLOGIC HISTORY: Oncology History Overview Note  Cancer Staging Pancreatic cancer [facility]) Staging form: Exocrine Pancreas, AJCC 8th Edition - Clinical stage from 10/16/2019: Stage IIB (cT2, cN1, cM0) - Signed by Truitt Merle, MD on 10/16/2019    Pancreatic cancer ([place])  10/03/2019 Imaging   US Abdomen 10/03/19  IMPRESSION: Markedly dilated bile ducts. Dilated gallbladder with sludge but no gallstones.   2.9 cm mass in the uncinate process most likely pancreatic neoplasm causing biliary obstruction. Recommend MRI of the pancreas without with contrast for further evaluation. If the patient cannot have MRI, CT of the pancreas with contrast recommended.   10/07/2019 Imaging   MRI Abdomen 10/07/19  IMPRESSION: 1. There is a hypoenhancing mass of the pancreatic uncinate with abrupt obstruction of the central common bile duct, measuring approximately 2.9 cm, consistent with pancreatic adenocarcinoma. 2. Gross intra and extrahepatic biliary ductal dilatation, common bile duct measuring 1.4 cm. Gallbladder hydrops. 3. The pancreatic duct is nondilated. 4. Pancreatic mass lies closely adjacent to the most central portions of the superior mesenteric vein and central portal vein, due to motion artifact it is difficult to clearly discern whether there is a preserved fat plane. Multiphasic contrast enhanced pancreatic protocol CT may be  less motion sensitive and better delineate vascular structures for the purposes of surgical planning if necessary. 5. No evidence of lymphadenopathy or metastatic disease in the abdomen.   10/10/2019 Procedure   EUS by Dr Paulita Fujita 10/10/19  IMPRESSION - A mass was identified at junction of head/uncinate process of the pancreas. This was staged T2 N1 Mx by endosonographic criteria. Fine needle aspiration performed. - A few abnormal lymph nodes were visualized in the peripancreatic region. - There was dilation in the common bile duct which measured up to 14 mm. - Hyperechoic material consistent with sludge was visualized endosonographically in the gallbladder.   10/10/2019 Procedure   ERCP by Dr Paulita Fujita 10/10/19  IMPRESSION - The major papilla appeared normal. - One temporary stent was placed into the ventral pancreatic duct. - A biliary sphincterotomy was performed. - One covered metal stent was placed into the common bile duct.   10/10/2019 Initial Biopsy   FINAL MICROSCOPIC DIAGNOSIS: 10/10/19  A. PANCREAS, HEAD, FINE NEEDLE ASPIRATION:  - Malignant cells present, consistent with adenocarcinoma    10/16/2019 Initial Diagnosis   Pancreatic cancer ([place])   10/16/2019 Cancer Staging   Staging form: Exocrine Pancreas, AJCC 8th Edition - Clinical stage from 10/16/2019: Stage IIB (cT2, cN1, cM0) - Signed by Truitt Merle, MD on 10/16/2019   10/23/2019 Imaging   CT CAP W contrast  IMPRESSION: 1. Pancreatic mass with local adenopathy and potential retroperitoneal adenopathy. Given retroperitoneal lymph nodes and presence of venous collaterals PET scan may be useful for staging purposes. 2. No definite vascular involvement or signs of upper abdominal venous collaterals. Some stranding around the celiac may be due to recent biopsy. 3. Borderline enlarged RIGHT external iliac lymph node. Attention on follow-up. 4. No evidence of metastatic disease in the  chest. 5. Signs of recent ERCP and stent  placement.   10/25/2019 Procedure   PAC placed    11/09/2019 -  Chemotherapy   Neoadjuvant FOLFIRINOX q2weeks starting 11/09/19 for 8 cycles   11/28/2019 Genetic Testing   Negative genetic testing on the common hereditary cancer panel, pancreatic cancer panel, preliminary evidence pancreatic cancer panel and chronic pancreatitis panel.  The Common Hereditary Gene Panel offered by [facility] includes sequencing and/or deletion duplication testing of the following 55 genes: APC*, ATM*, AXIN2, BARD1, BMPR1A, BRCA1, BRCA2, BRIP1, CASR, CDH1, CDK4, CDKN2A (p14ARF), CDKN2A (p16INK4a), CFTR*, CHEK2, CPA1, CTNNA1, CTRC, DICER1*, EPCAM*, FANCC, GREM1*, HOXB13, KIT, MEN1*, MLH1*, MSH2*, MSH3*, MSH6*, MUTYH, NBN, NF1*, NTHL1, PALB2, PALLD, PDGFRA, PMS2*, POLD1*, POLE, PRSS1*, PTEN*, RAD50, RAD51C, RAD51D, SDHA*, SDHB, SDHC*, SDHD, SMAD4, SMARCA4, SPINK1, STK11, TP53, TSC1*, TSC2, and VHL .  The report date is November 28, 2019.     CURRENT THERAPY: Neoadjuvant FOLFIRINOX q2 weeks starting 11/09/19 for 8 cycles   INTERVAL HISTORY: Ms. Horn returns for f/u and treatment as scheduled. She completed cycle 4 on 12/23/19.  She has cold sensitivity with mild numbness/tingling in her anger tips for about a week after treatment, no significant neuropathy with out cold exposure.  She has mild mucositis for 2 days after treatment then resolves, not limiting p.o. intake.  She has nausea for 1-1/2-2 days after treatment then resolves but she continues to take Compazine every 6.  She develops nausea during her last cycle of chemo and again this morning prior to arrival.  She had 1 "major" episode of diarrhea the day after her last chemo, Imodium helps.  She has mid to lower back pain which starts the night after her treatment and only lasts a few hours.  She thinks this is positional due to the chair in the treatment area.  Denies other abdominal or new pain.  No fever, chills, cough, chest pain, dyspnea, leg edema.    MEDICAL  HISTORY:  Past Medical History:  Diagnosis Date  . Cataracts, bilateral   . Diverticulosis 12/07/2001   found on colonoscopy  . Family history of colon cancer   . Family history of pancreatic cancer   . Hypothyroid 10/2007  . Macular hole   . Pancreatic mass   . PONV (postoperative nausea and vomiting)     SURGICAL HISTORY: Past Surgical History:  Procedure Laterality Date  . BILIARY STENT PLACEMENT  10/10/2019   Procedure: BILIARY STENT PLACEMENT;  Surgeon: Arta Silence, MD;  Location: [place];  Service: Endoscopy;;  . CATARACT EXTRACTION, BILATERAL    . COLONOSCOPY    . ERCP N/A 10/10/2019   Procedure: ENDOSCOPIC RETROGRADE CHOLANGIOPANCREATOGRAPHY (ERCP);  Surgeon: Arta Silence, MD;  Location: [facility] ENDOSCOPY;  Service: Endoscopy;  Laterality: N/A;  . ESOPHAGOGASTRODUODENOSCOPY N/A 10/10/2019   Procedure: ESOPHAGOGASTRODUODENOSCOPY (EGD);  Surgeon: Arta Silence, MD;  Location: [facility] ENDOSCOPY;  Service: Endoscopy;  Laterality: N/A;  . EUS N/A 10/10/2019   Procedure: UPPER ENDOSCOPIC ULTRASOUND (EUS) LINEAR;  Surgeon: Arta Silence, MD;  Location: [place];  Service: Endoscopy;  Laterality: N/A;  . FINE NEEDLE ASPIRATION  10/10/2019   Procedure: FINE NEEDLE ASPIRATION (FNA) LINEAR;  Surgeon: Arta Silence, MD;  Location: [place];  Service: Endoscopy;;  . IR IMAGING GUIDED PORT INSERTION  10/25/2019  . Macular hole surg    . PANCREATIC STENT PLACEMENT  10/10/2019   Procedure: PANCREATIC STENT PLACEMENT;  Surgeon: Arta Silence, MD;  Location: [place];  Service: Endoscopy;;  . SPHINCTEROTOMY  10/10/2019   Procedure:  SPHINCTEROTOMY;  Surgeon: Arta Silence, MD;  Location: [facility] ENDOSCOPY;  Service: Endoscopy;;  . TONSILLECTOMY      I have reviewed the social history and family history with the patient and they are unchanged from previous note.  ALLERGIES:  has No Known Allergies.  MEDICATIONS:  Current Outpatient Medications  Medication Sig Dispense Refill   . Ascorbic Acid (VITAMIN C PO) Take 1,000 mg by mouth daily.     . B Complex Vitamins (B-COMPLEX/B-12 PO) Take by mouth.    . Calcium Carb-Cholecalciferol (CALCIUM 1000 + D PO) Take by mouth.    . diphenhydrAMINE (BENADRYL) 25 MG tablet Take 25 mg by mouth at bedtime.    . ferrous sulfate 325 (65 FE) MG tablet Take 325 mg by mouth daily with breakfast.    . levothyroxine (SYNTHROID, LEVOTHROID) 50 MCG tablet Take 1 tablet (50 mcg total) by mouth daily. 90 tablet 4  . lidocaine-prilocaine (EMLA) cream Apply to affected area once 30 g 3  . LORazepam (ATIVAN) 0.5 MG tablet Take 1 tablet (0.5 mg total) by mouth daily as needed for anxiety. For anticipatory n/v prior to chemo treatments 15 tablet 0  . Multiple Vitamin (MULTIVITAMIN PO) Take by mouth.      . mupirocin ointment (BACTROBAN) 2 % Place 1 application into the nose daily as needed (sore in nose).    . ondansetron (ZOFRAN) 4 MG tablet Take 4 mg by mouth every 8 (eight) hours as needed.    . ondansetron (ZOFRAN) 8 MG tablet Take 1 tablet (8 mg total) by mouth 2 (two) times daily as needed. Start on day 3 after chemotherapy. 30 tablet 1  . prochlorperazine (COMPAZINE) 10 MG tablet Take 1 tablet (10 mg total) by mouth every 6 (six) hours as needed (Nausea or vomiting). 30 tablet 1   Current Facility-Administered Medications  Medication Dose Route Frequency Provider Last Rate Last Admin  . LORazepam (ATIVAN) tablet 0.5 mg  0.5 mg Oral Once Alla Feeling, NP       Facility-Administered Medications Ordered in Other Visits  Medication Dose Route Frequency Provider Last Rate Last Admin  . atropine injection 0.5 mg  0.5 mg Intravenous Once PRN Truitt Merle, MD      . dexamethasone (DECADRON) 10 mg in sodium chloride 0.9 % 50 mL IVPB  10 mg Intravenous Once Truitt Merle, MD      . fluorouracil (ADRUCIL) 3,950 mg in sodium chloride 0.9 % 71 mL chemo infusion  2,400 mg/m2 (Treatment Plan Recorded) Intravenous 1 day or 1 dose Truitt Merle, MD      .  fosaprepitant (EMEND) 150 mg in sodium chloride 0.9 % 145 mL IVPB  150 mg Intravenous Once Truitt Merle, MD      . irinotecan (CAMPTOSAR) 240 mg in sodium chloride 0.9 % 500 mL chemo infusion  150 mg/m2 (Treatment Plan Recorded) Intravenous Once Truitt Merle, MD      . leucovorin 660 mg in sodium chloride 0.9 % 250 mL infusion  400 mg/m2 (Treatment Plan Recorded) Intravenous Once Truitt Merle, MD      . oxaliplatin (ELOXATIN) 140 mg in dextrose 5 % 500 mL chemo infusion  85 mg/m2 (Treatment Plan Recorded) Intravenous Once Truitt Merle, MD        PHYSICAL EXAMINATION: ECOG PERFORMANCE STATUS: 1 - Symptomatic but completely ambulatory  Vitals:   01/07/20 1213  BP: 124/66  Pulse: 80  Resp: 18  Temp: 98.6 F (37 C)  SpO2: 100%   Filed Weights  01/07/20 1213  Weight: 130 lb 6.4 oz (59.1 kg)    GENERAL:alert, no distress and comfortable SKIN: No rash to exposed skin EYES:  sclera clear LUNGS: clear with normal breathing effort HEART: regular rate & rhythm, no lower extremity edema ABDOMEN:abdomen soft, non-tender and normal bowel sounds Musculoskeletal: No focal spinal tenderness NEURO: alert & oriented x 3 with fluent speech, normal gait PAC without erythema  LABORATORY DATA:  I have reviewed the data as listed CBC Latest Ref Rng & Units 01/07/2020 12/23/2019 12/09/2019  WBC 4.0 - 10.5 K/uL 11.1(H) 12.3(H) 12.3(H)  Hemoglobin 12.0 - 15.0 g/dL 11.9(L) 12.2 12.6  Hematocrit 36 - 46 % 36.9 38.4 39.3  Platelets 150 - 400 K/uL 129(L) 147(L) 143(L)     CMP Latest Ref Rng & Units 01/07/2020 12/23/2019 12/09/2019  Glucose 70 - 99 mg/dL 111(H) 102(H) 95  BUN 6 - 20 mg/dL _0 Creatinine 0.44 - 1.00 mg/dL 0.63 0.62 0.63  Sodium 135 - 145 mmol/L 135 138 138  Potassium 3.5 - 5.1 mmol/L 4.0 4.2 4.2  Chloride 98 - 111 mmol/L 102 104 105  CO2 22 - 32 mmol/L _1 Calcium 8.9 - 10.3 mg/dL 9.4 9.8 10.0  Total Protein 6.5 - 8.1 g/dL 7.8 7.2 7.2  Total Bilirubin 0.3 - 1.2 mg/dL 0.3 0.3 0.4  Alkaline  Phos 38 - 126 U/L 176(H) 176(H) 156(H)  AST 15 - 41 U/L 36 30 28  ALT 0 - 44 U/L _2 RADIOGRAPHIC STUDIES: I have personally reviewed the radiological images as listed and agreed with the findings in the report. No results found.   ASSESSMENT & PLAN: Breanna Noble a 59 y.o.caucasianfemalewith a history of Cataracts, Diverticulosis, Hypothyroidism  1. Pancreatic Cancer, Stage IIB, T2N1M0 -Her US showed bile duct and gallbladder dilation due to 2-3cm mass at head of pancreas/Uncinate. Her MRI showed her mass is laying close to SMV but not involving it. There is no evidence of lymphadenopathy or metastatic disease in the abdomenon MRI. -She underwent EUS and ERCP on 10/10/19 and had 2 stents placed by Dr Paulita Fujita. Herpancreatic mass biopsyshowed Adenocarcinoma of Pancreas.Endoscopic staging T2N1, noevidence of vascular involvement. -She was seen by Dr. Barry Dienes who felt she is resectable after a course of neoadjuvant chemo -She underwent CT CAP with pancreatic protocol showing the pancreatic mass with local adenopathy and borderline potential adenopathy in the periaortic region which will be followed on restaging scans. There is no definite vascular involvementor distant metastasis.We reviewed this with the patient and her husband today -Her case was reviewed in GI tumor conference on 10/30/19 -Again theplanis for neoadjuvant chemo followed by restaging scans and Whipple surgery -Given her young age and good performance status Dr. Burr Medico recommends intensive neoadjuvant chemo withFOLFIRINOXq2 weeksfor 8 cycles -began FOLFIRINOX with GCSF on 11/09/19   2. Obstructive Jaundice  -She had 3 weeks of Jaundice of eyes, skin and urine. She underwent ERCP with Dr Paulita Fujita for CBD and temporaryventral pancreatic ductstents placement on 10/10/19.  -Her case was reviewed in tumor board, stent is in good position -LFTs and hyperbilirubinemia continue to improve, clinically, her  juandice resolved  -We will monitor on chemo  3. Upper Abdominal Pain, Mild weight loss  -Presented with initial jaundice but since stent placement her pain has much improved. -Pain resolved -Weight is stable, she met with our dietitian. Continue supplements  4. Genetic Testing -She has maternal family history of pancreatic and colon cancer. I  discussed she is eligible of genetic testing. She is interested -negative result 11/28/19  Disposition: Ms. Carbon appears stable.  She completed 4 cycles of neoadjuvant mFOLFIRINOX.  She tolerates treatment well overall with mild cold sensitivity, nausea, diarrhea, and what sounds like positional back pain.  She appears to have a component of anticipatory nausea.  She will get Ativan 0.5 mg p.o. x1 today and a prescription to take this prior to her next visits.  She otherwise tolerates treatment well, side effects are managed with supportive meds at home.  She is able to recover and function well.  We reviewed the CBC and CMP from today. The most recent tumor marker has improved. She will proceed with cycle 5 neoadjuvant mFOLFIRINOX today as planned.  She will return for follow-up and cycle 6 in 2 weeks, with restaging after that.  She plans to follow-up with Dr. Barry Dienes in October.  She will get first COVID-19 vaccine today.  Orders Placed This Encounter  Procedures  . CT Abdomen Pelvis W Wo Contrast    This exam should ONLY be ordered for initial diagnosis or follow up of known pancreatic/liver/renal/bladder masses.    Standing Status:   Future    Standing Expiration Date:   01/06/2021    Order Specific Question:   If indicated for the ordered procedure, I authorize the administration of contrast media per Radiology protocol    Answer:   Yes    Order Specific Question:   Is patient pregnant?    Answer:   No    Order Specific Question:   Preferred imaging location?    Answer:   [facility]    Order Specific Question:   Is Oral Contrast  requested for this exam?    Answer:   Yes, Per Radiology protocol    Order Specific Question:   Radiology Contrast Protocol - do NOT remove file path    Answer:   [email or web address]    All questions were answered. The patient knows to call the clinic with any problems, questions or concerns. No barriers to learning were detected.     Alla Feeling, NP 01/07/20

## 2020-01-07 ENCOUNTER — Inpatient Hospital Stay: Payer: BC Managed Care – PPO

## 2020-01-07 ENCOUNTER — Inpatient Hospital Stay: Payer: BC Managed Care – PPO | Attending: Hematology

## 2020-01-07 ENCOUNTER — Encounter: Payer: Self-pay | Admitting: Nurse Practitioner

## 2020-01-07 ENCOUNTER — Inpatient Hospital Stay: Payer: BC Managed Care – PPO | Admitting: Nurse Practitioner

## 2020-01-07 ENCOUNTER — Other Ambulatory Visit: Payer: Self-pay

## 2020-01-07 VITALS — BP 124/66 | HR 80 | Temp 98.6°F | Resp 18 | Ht 65.0 in | Wt 130.4 lb

## 2020-01-07 DIAGNOSIS — C257 Malignant neoplasm of other parts of pancreas: Secondary | ICD-10-CM | POA: Insufficient documentation

## 2020-01-07 DIAGNOSIS — E039 Hypothyroidism, unspecified: Secondary | ICD-10-CM | POA: Insufficient documentation

## 2020-01-07 DIAGNOSIS — Z23 Encounter for immunization: Secondary | ICD-10-CM | POA: Insufficient documentation

## 2020-01-07 DIAGNOSIS — C250 Malignant neoplasm of head of pancreas: Secondary | ICD-10-CM | POA: Diagnosis not present

## 2020-01-07 DIAGNOSIS — K831 Obstruction of bile duct: Secondary | ICD-10-CM | POA: Insufficient documentation

## 2020-01-07 DIAGNOSIS — D6959 Other secondary thrombocytopenia: Secondary | ICD-10-CM | POA: Insufficient documentation

## 2020-01-07 DIAGNOSIS — C189 Malignant neoplasm of colon, unspecified: Secondary | ICD-10-CM | POA: Diagnosis not present

## 2020-01-07 DIAGNOSIS — Z95828 Presence of other vascular implants and grafts: Secondary | ICD-10-CM

## 2020-01-07 DIAGNOSIS — Z5111 Encounter for antineoplastic chemotherapy: Secondary | ICD-10-CM | POA: Diagnosis not present

## 2020-01-07 DIAGNOSIS — Z452 Encounter for adjustment and management of vascular access device: Secondary | ICD-10-CM | POA: Insufficient documentation

## 2020-01-07 DIAGNOSIS — Z5189 Encounter for other specified aftercare: Secondary | ICD-10-CM | POA: Diagnosis not present

## 2020-01-07 LAB — CMP (CANCER CENTER ONLY)
ALT: 30 U/L (ref 0–44)
AST: 36 U/L (ref 15–41)
Albumin: 3.6 g/dL (ref 3.5–5.0)
Alkaline Phosphatase: 176 U/L — ABNORMAL HIGH (ref 38–126)
Anion gap: 4 — ABNORMAL LOW (ref 5–15)
BUN: 13 mg/dL (ref 6–20)
CO2: 29 mmol/L (ref 22–32)
Calcium: 9.4 mg/dL (ref 8.9–10.3)
Chloride: 102 mmol/L (ref 98–111)
Creatinine: 0.63 mg/dL (ref 0.44–1.00)
GFR, Est AFR Am: >60 mL/min (ref >60)
GFR, Estimated: >60 mL/min (ref >60)
Glucose, Bld: 111 mg/dL — ABNORMAL HIGH (ref 70–99)
Potassium: 4 mmol/L (ref 3.5–5.1)
Sodium: 135 mmol/L (ref 135–145)
Total Bilirubin: 0.3 mg/dL (ref 0.3–1.2)
Total Protein: 7.8 g/dL (ref 6.5–8.1)

## 2020-01-07 LAB — CBC WITH DIFFERENTIAL (CANCER CENTER ONLY)
Abs Immature Granulocytes: 0.28 10*3/uL — ABNORMAL HIGH (ref 0.00–0.07)
Basophils Absolute: 0 10*3/uL (ref 0.0–0.1)
Basophils Relative: 0 %
Eosinophils Absolute: 0.1 10*3/uL (ref 0.0–0.5)
Eosinophils Relative: 1 %
HCT: 36.9 % (ref 36.0–46.0)
Hemoglobin: 11.9 g/dL — ABNORMAL LOW (ref 12.0–15.0)
Immature Granulocytes: 3 %
Lymphocytes Relative: 21 %
Lymphs Abs: 2.4 10*3/uL (ref 0.7–4.0)
MCH: 29.5 pg (ref 26.0–34.0)
MCHC: 32.2 g/dL (ref 30.0–36.0)
MCV: 91.3 fL (ref 80.0–100.0)
Monocytes Absolute: 1.7 10*3/uL — ABNORMAL HIGH (ref 0.1–1.0)
Monocytes Relative: 15 %
Neutro Abs: 6.7 10*3/uL (ref 1.7–7.7)
Neutrophils Relative %: 60 %
Platelet Count: 129 10*3/uL — ABNORMAL LOW (ref 150–400)
RBC: 4.04 MIL/uL (ref 3.87–5.11)
RDW: 15.9 % — ABNORMAL HIGH (ref 11.5–15.5)
WBC Count: 11.1 10*3/uL — ABNORMAL HIGH (ref 4.0–10.5)
nRBC: 0 % (ref 0.0–0.2)

## 2020-01-07 MED ORDER — PALONOSETRON HCL INJECTION 0.25 MG/5ML
0.2500 mg | Freq: Once | INTRAVENOUS | Status: AC
  Administered 2020-01-07: 0.25 mg via INTRAVENOUS

## 2020-01-07 MED ORDER — SODIUM CHLORIDE 0.9 % IV SOLN
10.0000 mg | Freq: Once | INTRAVENOUS | Status: AC
  Administered 2020-01-07: 10 mg via INTRAVENOUS
  Filled 2020-01-07: qty 10

## 2020-01-07 MED ORDER — SODIUM CHLORIDE 0.9 % IV SOLN
2400.0000 mg/m2 | INTRAVENOUS | Status: DC
  Administered 2020-01-07: 3950 mg via INTRAVENOUS
  Filled 2020-01-07: qty 79

## 2020-01-07 MED ORDER — SODIUM CHLORIDE 0.9 % IV SOLN
400.0000 mg/m2 | Freq: Once | INTRAVENOUS | Status: AC
  Administered 2020-01-07: 660 mg via INTRAVENOUS
  Filled 2020-01-07: qty 33

## 2020-01-07 MED ORDER — DEXTROSE 5 % IV SOLN
Freq: Once | INTRAVENOUS | Status: AC
  Filled 2020-01-07: qty 250

## 2020-01-07 MED ORDER — PALONOSETRON HCL INJECTION 0.25 MG/5ML
INTRAVENOUS | Status: AC
  Filled 2020-01-07: qty 5

## 2020-01-07 MED ORDER — LORAZEPAM 0.5 MG PO TABS: 0.5000 mg | ORAL_TABLET | Freq: Every day | ORAL | 0 refills | Status: DC | PRN

## 2020-01-07 MED ORDER — SODIUM CHLORIDE 0.9% FLUSH
10.0000 mL | INTRAVENOUS | Status: DC | PRN
  Administered 2020-01-07: 10 mL via INTRAVENOUS
  Filled 2020-01-07: qty 10

## 2020-01-07 MED ORDER — LORAZEPAM 1 MG PO TABS
ORAL_TABLET | ORAL | Status: AC
  Filled 2020-01-07: qty 1

## 2020-01-07 MED ORDER — LORAZEPAM 1 MG PO TABS: 0.5000 mg | ORAL_TABLET | Freq: Once | ORAL | Status: DC

## 2020-01-07 MED ORDER — ATROPINE SULFATE 1 MG/ML IJ SOLN
INTRAMUSCULAR | Status: AC
  Filled 2020-01-07: qty 1

## 2020-01-07 MED ORDER — OXALIPLATIN CHEMO INJECTION 100 MG/20ML
85.0000 mg/m2 | Freq: Once | INTRAVENOUS | Status: AC
  Administered 2020-01-07: 140 mg via INTRAVENOUS
  Filled 2020-01-07: qty 20

## 2020-01-07 MED ORDER — SODIUM CHLORIDE 0.9 % IV SOLN
150.0000 mg/m2 | Freq: Once | INTRAVENOUS | Status: AC
  Administered 2020-01-07: 240 mg via INTRAVENOUS
  Filled 2020-01-07: qty 12

## 2020-01-07 MED ORDER — LORAZEPAM 1 MG PO TABS
0.5000 mg | ORAL_TABLET | Freq: Once | ORAL | Status: AC
  Administered 2020-01-07: 0.5 mg via ORAL

## 2020-01-07 MED ORDER — ATROPINE SULFATE 1 MG/ML IJ SOLN
0.5000 mg | Freq: Once | INTRAMUSCULAR | Status: AC | PRN
  Administered 2020-01-07: 0.5 mg via INTRAVENOUS

## 2020-01-07 MED ORDER — SODIUM CHLORIDE 0.9 % IV SOLN
150.0000 mg | Freq: Once | INTRAVENOUS | Status: AC
  Administered 2020-01-07: 150 mg via INTRAVENOUS
  Filled 2020-01-07: qty 150

## 2020-01-07 NOTE — Patient Instructions (Signed)
Cancer Center Discharge Instructions for Patients Receiving Chemotherapy  Today you received the following chemotherapy agents oxaliplatin, irinotecan, leucovorin, and fluorouracil.  To help prevent nausea and vomiting after your treatment, we encourage you to take your nausea medication as directed.   If you develop nausea and vomiting that is not controlled by your nausea medication, call the clinic.   BELOW ARE SYMPTOMS THAT SHOULD BE REPORTED IMMEDIATELY:  *FEVER GREATER THAN 100.5 F  *CHILLS WITH OR WITHOUT FEVER  NAUSEA AND VOMITING THAT IS NOT CONTROLLED WITH YOUR NAUSEA MEDICATION  *UNUSUAL SHORTNESS OF BREATH  *UNUSUAL BRUISING OR BLEEDING  TENDERNESS IN MOUTH AND THROAT WITH OR WITHOUT PRESENCE OF ULCERS  *URINARY PROBLEMS  *BOWEL PROBLEMS  UNUSUAL RASH Items with * indicate a potential emergency and should be followed up as soon as possible.  Feel free to call the clinic should you have any questions or concerns. The clinic phone number is [phone].  Please show the CHEMO ALERT CARD at check-in to the Emergency Department and triage nurse.   

## 2020-01-07 NOTE — Progress Notes (Signed)
   Covid-19 Vaccination Clinic  Name:  Breanna Noble    MRN: [id number] DOB: Oct 13, 1960  01/07/2020  [name] was observed post Covid-19 immunization for 15 minutes without incident. She was provided with Vaccine Information Sheet and instruction to access the V-Safe system.   [name] was instructed to call 911 with any severe reactions post vaccine: Marland Kitchen Difficulty breathing  . Swelling of face and throat  . A fast heartbeat  . A bad rash all over body  . Dizziness and weakness   Immunizations Administered    Name Date Dose VIS Date Route   Pfizer COVID-19 Vaccine 01/07/2020  1:38 PM 0.3 mL 06/26/2018 Intramuscular   Manufacturer: [place]   Lot: [id number]   [place]: [id number]

## 2020-01-07 NOTE — Patient Instructions (Signed)

## 2020-01-08 ENCOUNTER — Other Ambulatory Visit: Payer: Self-pay | Admitting: Hematology

## 2020-01-08 ENCOUNTER — Telehealth: Payer: Self-pay | Admitting: Nurse Practitioner

## 2020-01-08 DIAGNOSIS — C250 Malignant neoplasm of head of pancreas: Secondary | ICD-10-CM

## 2020-01-08 NOTE — Telephone Encounter (Signed)
Scheduled per 9/7 los. Noted to give pt appt calendar on next visit.

## 2020-01-09 ENCOUNTER — Inpatient Hospital Stay: Payer: BC Managed Care – PPO

## 2020-01-09 ENCOUNTER — Other Ambulatory Visit: Payer: Self-pay

## 2020-01-09 VITALS — BP 115/56 | HR 73 | Temp 98.4°F | Resp 18

## 2020-01-09 DIAGNOSIS — Z452 Encounter for adjustment and management of vascular access device: Secondary | ICD-10-CM | POA: Diagnosis not present

## 2020-01-09 DIAGNOSIS — Z5111 Encounter for antineoplastic chemotherapy: Secondary | ICD-10-CM | POA: Diagnosis not present

## 2020-01-09 DIAGNOSIS — Z23 Encounter for immunization: Secondary | ICD-10-CM | POA: Diagnosis not present

## 2020-01-09 DIAGNOSIS — D6959 Other secondary thrombocytopenia: Secondary | ICD-10-CM | POA: Diagnosis not present

## 2020-01-09 DIAGNOSIS — C257 Malignant neoplasm of other parts of pancreas: Secondary | ICD-10-CM | POA: Diagnosis not present

## 2020-01-09 DIAGNOSIS — E039 Hypothyroidism, unspecified: Secondary | ICD-10-CM | POA: Diagnosis not present

## 2020-01-09 DIAGNOSIS — C250 Malignant neoplasm of head of pancreas: Secondary | ICD-10-CM

## 2020-01-09 DIAGNOSIS — Z5189 Encounter for other specified aftercare: Secondary | ICD-10-CM | POA: Diagnosis not present

## 2020-01-09 DIAGNOSIS — K831 Obstruction of bile duct: Secondary | ICD-10-CM | POA: Diagnosis not present

## 2020-01-09 MED ORDER — HEPARIN SOD (PORK) LOCK FLUSH 100 UNIT/ML IV SOLN
500.0000 [IU] | Freq: Once | INTRAVENOUS | Status: AC | PRN
  Administered 2020-01-09: 500 [IU]
  Filled 2020-01-09: qty 5

## 2020-01-09 MED ORDER — PEGFILGRASTIM-CBQV 6 MG/0.6ML ~~LOC~~ SOSY
PREFILLED_SYRINGE | SUBCUTANEOUS | Status: AC
  Filled 2020-01-09: qty 0.6

## 2020-01-09 MED ORDER — PEGFILGRASTIM-CBQV 6 MG/0.6ML ~~LOC~~ SOSY
6.0000 mg | PREFILLED_SYRINGE | Freq: Once | SUBCUTANEOUS | Status: AC
  Administered 2020-01-09: 6 mg via SUBCUTANEOUS

## 2020-01-09 MED ORDER — SODIUM CHLORIDE 0.9% FLUSH
10.0000 mL | INTRAVENOUS | Status: DC | PRN
  Administered 2020-01-09: 10 mL
  Filled 2020-01-09: qty 10

## 2020-01-17 NOTE — Progress Notes (Signed)
[place]   [phone] [phone]   Clinic Follow up Note   Patient Care Team: Vernie Shanks, MD as PCP - General (Family Medicine) Fontaine, Belinda Block, MD (Inactive) as Consulting Physician (Gynecology) Truitt Merle, MD as Consulting Physician (Oncology) Jonnie Finner, RN as Registered Nurse Arta Silence, MD as Consulting Physician (Gastroenterology) Stark Klein, MD as Consulting Physician (General Surgery) Karie Mainland, RD as Dietitian (Nutrition)  Date of Service:  01/20/2020  CHIEF COMPLAINT: F/u of Pancreatic Cancer  SUMMARY OF ONCOLOGIC HISTORY: Oncology History Overview Note  Cancer Staging Pancreatic cancer [facility]) Staging form: Exocrine Pancreas, AJCC 8th Edition - Clinical stage from 10/16/2019: Stage IIB (cT2, cN1, cM0) - Signed by Truitt Merle, MD on 10/16/2019    Pancreatic cancer ([place])  10/03/2019 Imaging   US Abdomen 10/03/19  IMPRESSION: Markedly dilated bile ducts. Dilated gallbladder with sludge but no gallstones.   2.9 cm mass in the uncinate process most likely pancreatic neoplasm causing biliary obstruction. Recommend MRI of the pancreas without with contrast for further evaluation. If the patient cannot have MRI, CT of the pancreas with contrast recommended.   10/07/2019 Imaging   MRI Abdomen 10/07/19  IMPRESSION: 1. There is a hypoenhancing mass of the pancreatic uncinate with abrupt obstruction of the central common bile duct, measuring approximately 2.9 cm, consistent with pancreatic adenocarcinoma. 2. Gross intra and extrahepatic biliary ductal dilatation, common bile duct measuring 1.4 cm. Gallbladder hydrops. 3. The pancreatic duct is nondilated. 4. Pancreatic mass lies closely adjacent to the most central portions of the superior mesenteric vein and central portal vein, due to motion artifact it is difficult to clearly discern whether there is a preserved fat plane. Multiphasic contrast  enhanced pancreatic protocol CT may be less motion sensitive and better delineate vascular structures for the purposes of surgical planning if necessary. 5. No evidence of lymphadenopathy or metastatic disease in the abdomen.   10/10/2019 Procedure   EUS by Dr Paulita Fujita 10/10/19  IMPRESSION - A mass was identified at junction of head/uncinate process of the pancreas. This was staged T2 N1 Mx by endosonographic criteria. Fine needle aspiration performed. - A few abnormal lymph nodes were visualized in the peripancreatic region. - There was dilation in the common bile duct which measured up to 14 mm. - Hyperechoic material consistent with sludge was visualized endosonographically in the gallbladder.   10/10/2019 Procedure   ERCP by Dr Paulita Fujita 10/10/19  IMPRESSION - The major papilla appeared normal. - One temporary stent was placed into the ventral pancreatic duct. - A biliary sphincterotomy was performed. - One covered metal stent was placed into the common bile duct.   10/10/2019 Initial Biopsy   FINAL MICROSCOPIC DIAGNOSIS: 10/10/19  A. PANCREAS, HEAD, FINE NEEDLE ASPIRATION:  - Malignant cells present, consistent with adenocarcinoma    10/16/2019 Initial Diagnosis   Pancreatic cancer ([place])   10/16/2019 Cancer Staging   Staging form: Exocrine Pancreas, AJCC 8th Edition - Clinical stage from 10/16/2019: Stage IIB (cT2, cN1, cM0) - Signed by Truitt Merle, MD on 10/16/2019   10/23/2019 Imaging   CT CAP W contrast  IMPRESSION: 1. Pancreatic mass with local adenopathy and potential retroperitoneal adenopathy. Given retroperitoneal lymph nodes and presence of venous collaterals PET scan may be useful for staging purposes. 2. No definite vascular involvement or signs of upper abdominal venous collaterals. Some stranding around the celiac may be due to recent biopsy. 3. Borderline enlarged RIGHT external iliac lymph node. Attention on follow-up. 4. No evidence  of metastatic disease in the  chest. 5. Signs of recent ERCP and stent placement.   10/25/2019 Procedure   PAC placed    11/09/2019 -  Chemotherapy   Neoadjuvant FOLFIRINOX q2weeks starting 11/09/19 for 8 cycles   11/28/2019 Genetic Testing   Negative genetic testing on the common hereditary cancer panel, pancreatic cancer panel, preliminary evidence pancreatic cancer panel and chronic pancreatitis panel.  The Common Hereditary Gene Panel offered by [facility] includes sequencing and/or deletion duplication testing of the following 55 genes: APC*, ATM*, AXIN2, BARD1, BMPR1A, BRCA1, BRCA2, BRIP1, CASR, CDH1, CDK4, CDKN2A (p14ARF), CDKN2A (p16INK4a), CFTR*, CHEK2, CPA1, CTNNA1, CTRC, DICER1*, EPCAM*, FANCC, GREM1*, HOXB13, KIT, MEN1*, MLH1*, MSH2*, MSH3*, MSH6*, MUTYH, NBN, NF1*, NTHL1, PALB2, PALLD, PDGFRA, PMS2*, POLD1*, POLE, PRSS1*, PTEN*, RAD50, RAD51C, RAD51D, SDHA*, SDHB, SDHC*, SDHD, SMAD4, SMARCA4, SPINK1, STK11, TP53, TSC1*, TSC2, and VHL .  The report date is November 28, 2019.      CURRENT THERAPY:  Neoadjuvant FOLFIRINOX q2weeks starting 11/09/19 for 8 cycles  INTERVAL HISTORY:  Breanna Noble is here for a follow up and treatment. She presents to the clinic alone. She notes she is doing fairly well. She notes she has lower appetite and nausea for day 1-2 of chemo but able to eat otherwise. She uses 2 boost and premier Protein shakes on those days. She notes antiemetics help her. She notes  Cold sensitivity that lasted 1 week mildly. She denies other neuropathy at this time.     REVIEW OF SYSTEMS:   Constitutional: Denies fevers, chills or abnormal weight loss Eyes: Denies blurriness of vision Ears, nose, mouth, throat, and face: Denies mucositis or sore throat Respiratory: Denies cough, dyspnea or wheezes Cardiovascular: Denies palpitation, chest discomfort or lower extremity swelling Gastrointestinal:  Denies heartburn or change in bowel habits (+) mild Nausea  Skin: Denies abnormal skin rashes Lymphatics: Denies new  lymphadenopathy or easy bruising Neurological:Denies numbness, tingling or new weaknesses Behavioral/Psych: Mood is stable, no new changes  All other systems were reviewed with the patient and are negative.  MEDICAL HISTORY:  Past Medical History:  Diagnosis Date  . Cataracts, bilateral   . Diverticulosis 12/07/2001   found on colonoscopy  . Family history of colon cancer   . Family history of pancreatic cancer   . Hypothyroid 10/2007  . Macular hole   . Pancreatic mass   . PONV (postoperative nausea and vomiting)     SURGICAL HISTORY: Past Surgical History:  Procedure Laterality Date  . BILIARY STENT PLACEMENT  10/10/2019   Procedure: BILIARY STENT PLACEMENT;  Surgeon: Arta Silence, MD;  Location: [place];  Service: Endoscopy;;  . CATARACT EXTRACTION, BILATERAL    . COLONOSCOPY    . ERCP N/A 10/10/2019   Procedure: ENDOSCOPIC RETROGRADE CHOLANGIOPANCREATOGRAPHY (ERCP);  Surgeon: Arta Silence, MD;  Location: [facility] ENDOSCOPY;  Service: Endoscopy;  Laterality: N/A;  . ESOPHAGOGASTRODUODENOSCOPY N/A 10/10/2019   Procedure: ESOPHAGOGASTRODUODENOSCOPY (EGD);  Surgeon: Arta Silence, MD;  Location: [facility] ENDOSCOPY;  Service: Endoscopy;  Laterality: N/A;  . EUS N/A 10/10/2019   Procedure: UPPER ENDOSCOPIC ULTRASOUND (EUS) LINEAR;  Surgeon: Arta Silence, MD;  Location: [place];  Service: Endoscopy;  Laterality: N/A;  . FINE NEEDLE ASPIRATION  10/10/2019   Procedure: FINE NEEDLE ASPIRATION (FNA) LINEAR;  Surgeon: Arta Silence, MD;  Location: [place];  Service: Endoscopy;;  . IR IMAGING GUIDED PORT INSERTION  10/25/2019  . Macular hole surg    . PANCREATIC STENT PLACEMENT  10/10/2019   Procedure: PANCREATIC STENT PLACEMENT;  Surgeon: Paulita Fujita,  Gwyndolyn Saxon, MD;  Location: [facility] ENDOSCOPY;  Service: Endoscopy;;  . Joan Mayans  10/10/2019   Procedure: Joan Mayans;  Surgeon: Arta Silence, MD;  Location: [facility] ENDOSCOPY;  Service: Endoscopy;;  . TONSILLECTOMY      I have  reviewed the social history and family history with the patient and they are unchanged from previous note.  ALLERGIES:  has No Known Allergies.  MEDICATIONS:  Current Outpatient Medications  Medication Sig Dispense Refill  . Ascorbic Acid (VITAMIN C PO) Take 1,000 mg by mouth daily.     . B Complex Vitamins (B-COMPLEX/B-12 PO) Take by mouth.    . Calcium Carb-Cholecalciferol (CALCIUM 1000 + D PO) Take by mouth.    . diphenhydrAMINE (BENADRYL) 25 MG tablet Take 25 mg by mouth at bedtime.    . ferrous sulfate 325 (65 FE) MG tablet Take 325 mg by mouth daily with breakfast.    . levothyroxine (SYNTHROID, LEVOTHROID) 50 MCG tablet Take 1 tablet (50 mcg total) by mouth daily. 90 tablet 4  . lidocaine-prilocaine (EMLA) cream Apply to affected area once 30 g 3  . LORazepam (ATIVAN) 0.5 MG tablet Take 1 tablet (0.5 mg total) by mouth daily as needed for anxiety. For anticipatory n/v prior to chemo treatments 15 tablet 0  . Multiple Vitamin (MULTIVITAMIN PO) Take by mouth.      . mupirocin ointment (BACTROBAN) 2 % Place 1 application into the nose daily as needed (sore in nose).    . ondansetron (ZOFRAN) 4 MG tablet Take 4 mg by mouth every 8 (eight) hours as needed.    . ondansetron (ZOFRAN) 8 MG tablet Take 1 tablet (8 mg total) by mouth 2 (two) times daily as needed. Start on day 3 after chemotherapy. 30 tablet 1  . prochlorperazine (COMPAZINE) 10 MG tablet TAKE 1 TABLET BY MOUTH EVERY 6 HOURS AS NEEDED FOR NAUSEA AND VOMITING 30 tablet 0   No current facility-administered medications for this visit.   Facility-Administered Medications Ordered in Other Visits  Medication Dose Route Frequency Provider Last Rate Last Admin  . atropine injection 0.5 mg  0.5 mg Intravenous Once PRN Truitt Merle, MD      . fluorouracil (ADRUCIL) 3,950 mg in sodium chloride 0.9 % 71 mL chemo infusion  2,400 mg/m2 (Treatment Plan Recorded) Intravenous 1 day or 1 dose Truitt Merle, MD      . heparin lock flush 100 unit/mL   500 Units Intracatheter Once PRN Truitt Merle, MD      . irinotecan (CAMPTOSAR) 240 mg in sodium chloride 0.9 % 500 mL chemo infusion  150 mg/m2 (Treatment Plan Recorded) Intravenous Once Truitt Merle, MD      . leucovorin 660 mg in sodium chloride 0.9 % 250 mL infusion  400 mg/m2 (Treatment Plan Recorded) Intravenous Once Truitt Merle, MD      . oxaliplatin (ELOXATIN) 140 mg in dextrose 5 % 500 mL chemo infusion  85 mg/m2 (Treatment Plan Recorded) Intravenous Once Truitt Merle, MD 264 mL/hr at 01/20/20 1219 140 mg at 01/20/20 1219  . sodium chloride flush (NS) 0.9 % injection 10 mL  10 mL Intracatheter PRN Truitt Merle, MD        PHYSICAL EXAMINATION: ECOG PERFORMANCE STATUS: 1 - Symptomatic but completely ambulatory  Vitals:   01/20/20 1005  BP: 122/69  Pulse: 69  Resp: 20  Temp: 97.8 F (36.6 C)  SpO2: 100%   Filed Weights   01/20/20 1005  Weight: 128 lb (58.1 kg)    Due to COVID19 we  will limit examination to appearance. Patient had no complaints.  GENERAL:alert, no distress and comfortable SKIN: skin color normal, no rashes or significant lesions EYES: normal, Conjunctiva are pink and non-injected, sclera clear  NEURO: alert & oriented x 3 with fluent speech    LABORATORY DATA:  I have reviewed the data as listed CBC Latest Ref Rng & Units 01/20/2020 01/07/2020 12/23/2019  WBC 4.0 - 10.5 K/uL 10.4 11.1(H) 12.3(H)  Hemoglobin 12.0 - 15.0 g/dL 12.1 11.9(L) 12.2  Hematocrit 36 - 46 % 37.9 36.9 38.4  Platelets 150 - 400 K/uL 111(L) 129(L) 147(L)     CMP Latest Ref Rng & Units 01/20/2020 01/07/2020 12/23/2019  Glucose 70 - 99 mg/dL 89 111(H) 102(H)  BUN 6 - 20 mg/dL _0 Creatinine 0.44 - 1.00 mg/dL 0.62 0.63 0.62  Sodium 135 - 145 mmol/L 137 135 138  Potassium 3.5 - 5.1 mmol/L 4.0 4.0 4.2  Chloride 98 - 111 mmol/L 103 102 104  CO2 22 - 32 mmol/L _1 Calcium 8.9 - 10.3 mg/dL 9.6 9.4 9.8  Total Protein 6.5 - 8.1 g/dL 8.0 7.8 7.2  Total Bilirubin 0.3 - 1.2 mg/dL 0.3 0.3 0.3    Alkaline Phos 38 - 126 U/L 223(H) 176(H) 176(H)  AST 15 - 41 U/L 39 36 30  ALT 0 - 44 U/L _2 RADIOGRAPHIC STUDIES: I have personally reviewed the radiological images as listed and agreed with the findings in the report. No results found.   ASSESSMENT & PLAN:  Breanna Noble is a 59 y.o. female with    1. Pancreatic Cancer, Stage IIB, cT2N1 -She was diagnosed in 10/2019.Her initialUS showed bile duct and gallbladder dilation due to 2-3cm mass at head of pancreas/Uncinate. Her MRI showed her mass is laying close to SMV but not involving it. Her CT CAP from 10/23/19 showedborderline potential adenopathy in the periaortic region which will be followed on restaging scans. There is no definite vascular involvementor distant metastasis. -She underwent EUS and ERCP on 10/10/19 and had 2 stents placed by Dr Paulita Fujita. Herpancreatic mass biopsyshowed Adenocarcinoma of Pancreas.Endoscopic staging T2N1, noevidence of vascular involvement. -I started her onneoadjuvant chemo with FOLFIRINOX every 2 weeks for 8 cycles over 4 months beginning 11/09/19.If she responds well enough she will proceed with surgery. -S/p C5 she is stable. She is able to maintain most of her weight and cold sensitivity and nausea is manageable. No neuropathy at this time.  -Lab reviewed, plt 111K, alk Phos 223 and adequate to proceed with C6 FOLFIRINOX today at same dose.  -Plan to proceed with scan on 01/28/20 and second COVID vaccine same day. Will discuss her scan at the following GI Tumor Board.  -She will f/u with Dr Barry Dienes next month.  -F/u in 2 weeks   2. Obstructive Jaundice, s/p 2 stent placements (now resolved), Elevated Alk Phos  -I encouraged her to watch for signs of Jaundice, fever or RUQ pain.  -Alk Phos has been elevated lately and increased to 223 today (01/20/20). I discussed chemo can contribute to this.   3. Upper Abdominal Pain, Weight loss, Nausea -Presented with initial jaundice but since  stent placement her pain has mostly resolvedand only occasional. -Nausea from chemo is controlled on Compazine and Zofran. Her weight is maintained on Boost and premier protein and adequate eating. If she loses more weight I recommend she increase supplements.  -She will continue to f/u with dietician.Stable.   4.  Genetic Testing in 10/2019 was negative for pathogenetic mutations.   5. Thrombocytopenia, secondary to chemo  -Plt decreased to 111K today (01/20/20). Will monitor  -I discussed risk of bleeding and to avoid injury.    PLAN: -Labs reviewed and adequate to proceed with C6FOLFIRINOX todayat same dose, with GCSF on day 3 -Lab, flush, f/uand chem in 2, 4 weeks -CT AP and COVID vaccine on 01/28/20    No problem-specific Assessment & Plan notes found for this encounter.   No orders of the defined types were placed in this encounter.  All questions were answered. The patient knows to call the clinic with any problems, questions or concerns. No barriers to learning was detected. The total time spent in the appointment was 30 minutes.     Truitt Merle, MD 01/20/2020   I, Joslyn Devon, am acting as scribe for Truitt Merle, MD.   I have reviewed the above documentation for accuracy and completeness, and I agree with the above.

## 2020-01-20 ENCOUNTER — Other Ambulatory Visit: Payer: Self-pay

## 2020-01-20 ENCOUNTER — Telehealth: Payer: Self-pay | Admitting: Hematology

## 2020-01-20 ENCOUNTER — Inpatient Hospital Stay: Payer: BC Managed Care – PPO

## 2020-01-20 ENCOUNTER — Inpatient Hospital Stay: Payer: BC Managed Care – PPO | Admitting: Hematology

## 2020-01-20 ENCOUNTER — Encounter: Payer: Self-pay | Admitting: Hematology

## 2020-01-20 VITALS — BP 122/69 | HR 69 | Temp 97.8°F | Resp 20 | Ht 65.0 in | Wt 128.0 lb

## 2020-01-20 DIAGNOSIS — C250 Malignant neoplasm of head of pancreas: Secondary | ICD-10-CM

## 2020-01-20 DIAGNOSIS — Z452 Encounter for adjustment and management of vascular access device: Secondary | ICD-10-CM | POA: Diagnosis not present

## 2020-01-20 DIAGNOSIS — Z5111 Encounter for antineoplastic chemotherapy: Secondary | ICD-10-CM | POA: Diagnosis not present

## 2020-01-20 DIAGNOSIS — K831 Obstruction of bile duct: Secondary | ICD-10-CM | POA: Diagnosis not present

## 2020-01-20 DIAGNOSIS — D6959 Other secondary thrombocytopenia: Secondary | ICD-10-CM | POA: Diagnosis not present

## 2020-01-20 DIAGNOSIS — Z5189 Encounter for other specified aftercare: Secondary | ICD-10-CM | POA: Diagnosis not present

## 2020-01-20 DIAGNOSIS — Z23 Encounter for immunization: Secondary | ICD-10-CM | POA: Diagnosis not present

## 2020-01-20 DIAGNOSIS — Z95828 Presence of other vascular implants and grafts: Secondary | ICD-10-CM

## 2020-01-20 DIAGNOSIS — E039 Hypothyroidism, unspecified: Secondary | ICD-10-CM | POA: Diagnosis not present

## 2020-01-20 DIAGNOSIS — C189 Malignant neoplasm of colon, unspecified: Secondary | ICD-10-CM | POA: Diagnosis not present

## 2020-01-20 DIAGNOSIS — C257 Malignant neoplasm of other parts of pancreas: Secondary | ICD-10-CM | POA: Diagnosis not present

## 2020-01-20 LAB — CMP (CANCER CENTER ONLY)
ALT: 31 U/L (ref 0–44)
AST: 39 U/L (ref 15–41)
Albumin: 3.7 g/dL (ref 3.5–5.0)
Alkaline Phosphatase: 223 U/L — ABNORMAL HIGH (ref 38–126)
Anion gap: 7 (ref 5–15)
BUN: 10 mg/dL (ref 6–20)
CO2: 27 mmol/L (ref 22–32)
Calcium: 9.6 mg/dL (ref 8.9–10.3)
Chloride: 103 mmol/L (ref 98–111)
Creatinine: 0.62 mg/dL (ref 0.44–1.00)
GFR, Est AFR Am: >60 mL/min (ref >60)
GFR, Estimated: >60 mL/min (ref >60)
Glucose, Bld: 89 mg/dL (ref 70–99)
Potassium: 4 mmol/L (ref 3.5–5.1)
Sodium: 137 mmol/L (ref 135–145)
Total Bilirubin: 0.3 mg/dL (ref 0.3–1.2)
Total Protein: 8 g/dL (ref 6.5–8.1)

## 2020-01-20 LAB — CBC WITH DIFFERENTIAL (CANCER CENTER ONLY)
Abs Immature Granulocytes: 0.31 10*3/uL — ABNORMAL HIGH (ref 0.00–0.07)
Basophils Absolute: 0.1 10*3/uL (ref 0.0–0.1)
Basophils Relative: 1 %
Eosinophils Absolute: 0.1 10*3/uL (ref 0.0–0.5)
Eosinophils Relative: 1 %
HCT: 37.9 % (ref 36.0–46.0)
Hemoglobin: 12.1 g/dL (ref 12.0–15.0)
Immature Granulocytes: 3 %
Lymphocytes Relative: 30 %
Lymphs Abs: 3.1 10*3/uL (ref 0.7–4.0)
MCH: 28.8 pg (ref 26.0–34.0)
MCHC: 31.9 g/dL (ref 30.0–36.0)
MCV: 90.2 fL (ref 80.0–100.0)
Monocytes Absolute: 1.7 10*3/uL — ABNORMAL HIGH (ref 0.1–1.0)
Monocytes Relative: 16 %
Neutro Abs: 5.2 10*3/uL (ref 1.7–7.7)
Neutrophils Relative %: 49 %
Platelet Count: 111 10*3/uL — ABNORMAL LOW (ref 150–400)
RBC: 4.2 MIL/uL (ref 3.87–5.11)
RDW: 16.2 % — ABNORMAL HIGH (ref 11.5–15.5)
WBC Count: 10.4 10*3/uL (ref 4.0–10.5)
nRBC: 0 % (ref 0.0–0.2)

## 2020-01-20 MED ORDER — PALONOSETRON HCL INJECTION 0.25 MG/5ML
0.2500 mg | Freq: Once | INTRAVENOUS | Status: AC
  Administered 2020-01-20: 0.25 mg via INTRAVENOUS

## 2020-01-20 MED ORDER — PALONOSETRON HCL INJECTION 0.25 MG/5ML
INTRAVENOUS | Status: AC
  Filled 2020-01-20: qty 5

## 2020-01-20 MED ORDER — DEXTROSE 5 % IV SOLN
Freq: Once | INTRAVENOUS | Status: AC
  Filled 2020-01-20: qty 250

## 2020-01-20 MED ORDER — ATROPINE SULFATE 1 MG/ML IJ SOLN
0.5000 mg | Freq: Once | INTRAMUSCULAR | Status: AC | PRN
  Administered 2020-01-20: 0.5 mg via INTRAVENOUS

## 2020-01-20 MED ORDER — SODIUM CHLORIDE 0.9% FLUSH
10.0000 mL | INTRAVENOUS | Status: DC | PRN
  Administered 2020-01-20: 10 mL via INTRAVENOUS
  Filled 2020-01-20: qty 10

## 2020-01-20 MED ORDER — SODIUM CHLORIDE 0.9 % IV SOLN
2400.0000 mg/m2 | INTRAVENOUS | Status: DC
  Administered 2020-01-20: 3950 mg via INTRAVENOUS
  Filled 2020-01-20: qty 79

## 2020-01-20 MED ORDER — SODIUM CHLORIDE 0.9 % IV SOLN
400.0000 mg/m2 | Freq: Once | INTRAVENOUS | Status: AC
  Administered 2020-01-20: 660 mg via INTRAVENOUS
  Filled 2020-01-20: qty 33

## 2020-01-20 MED ORDER — SODIUM CHLORIDE 0.9 % IV SOLN
10.0000 mg | Freq: Once | INTRAVENOUS | Status: AC
  Administered 2020-01-20: 10 mg via INTRAVENOUS
  Filled 2020-01-20: qty 10

## 2020-01-20 MED ORDER — ATROPINE SULFATE 1 MG/ML IJ SOLN
INTRAMUSCULAR | Status: AC
  Filled 2020-01-20: qty 1

## 2020-01-20 MED ORDER — SODIUM CHLORIDE 0.9 % IV SOLN
150.0000 mg/m2 | Freq: Once | INTRAVENOUS | Status: AC
  Administered 2020-01-20: 240 mg via INTRAVENOUS
  Filled 2020-01-20: qty 12

## 2020-01-20 MED ORDER — SODIUM CHLORIDE 0.9% FLUSH
10.0000 mL | INTRAVENOUS | Status: DC | PRN
  Filled 2020-01-20: qty 10

## 2020-01-20 MED ORDER — SODIUM CHLORIDE 0.9 % IV SOLN
150.0000 mg | Freq: Once | INTRAVENOUS | Status: AC
  Administered 2020-01-20: 150 mg via INTRAVENOUS
  Filled 2020-01-20: qty 150

## 2020-01-20 MED ORDER — OXALIPLATIN CHEMO INJECTION 100 MG/20ML
85.0000 mg/m2 | Freq: Once | INTRAVENOUS | Status: AC
  Administered 2020-01-20: 140 mg via INTRAVENOUS
  Filled 2020-01-20: qty 10

## 2020-01-20 MED ORDER — HEPARIN SOD (PORK) LOCK FLUSH 100 UNIT/ML IV SOLN
500.0000 [IU] | Freq: Once | INTRAVENOUS | Status: DC | PRN
  Filled 2020-01-20: qty 5

## 2020-01-20 NOTE — Patient Instructions (Signed)
[facility] Discharge Instructions for Patients Receiving Chemotherapy  Today you received the following chemotherapy agents oxaliplatin, irinotecan, leucovorin, and fluorouracil.  To help prevent nausea and vomiting after your treatment, we encourage you to take your nausea medication as directed.   If you develop nausea and vomiting that is not controlled by your nausea medication, call the clinic.   BELOW ARE SYMPTOMS THAT SHOULD BE REPORTED IMMEDIATELY:  *FEVER GREATER THAN 100.5 F  *CHILLS WITH OR WITHOUT FEVER  NAUSEA AND VOMITING THAT IS NOT CONTROLLED WITH YOUR NAUSEA MEDICATION  *UNUSUAL SHORTNESS OF BREATH  *UNUSUAL BRUISING OR BLEEDING  TENDERNESS IN MOUTH AND THROAT WITH OR WITHOUT PRESENCE OF ULCERS  *URINARY PROBLEMS  *BOWEL PROBLEMS  UNUSUAL RASH Items with * indicate a potential emergency and should be followed up as soon as possible.  Feel free to call the clinic should you have any questions or concerns. The clinic phone number is [phone].  Please show the CHEMO ALERT CARD at check-in to the Emergency Department and triage nurse.   

## 2020-01-20 NOTE — Telephone Encounter (Signed)
Scheduled per 9/20 los. Messaged RN Heidi to give pt appt calendar.

## 2020-01-20 NOTE — Patient Instructions (Signed)

## 2020-01-20 NOTE — Progress Notes (Signed)
During Irinotecan infusion patient c/o nausea.  Patient took her home compazine.  The nurse offered a different nausea med if needed, patient refused.

## 2020-01-21 LAB — CANCER ANTIGEN 19-9: CA 19-9: 187 U/mL — ABNORMAL HIGH (ref 0–35)

## 2020-01-22 ENCOUNTER — Other Ambulatory Visit: Payer: Self-pay

## 2020-01-22 ENCOUNTER — Inpatient Hospital Stay: Payer: BC Managed Care – PPO

## 2020-01-22 VITALS — BP 108/49 | HR 82 | Temp 98.4°F | Resp 18

## 2020-01-22 DIAGNOSIS — C257 Malignant neoplasm of other parts of pancreas: Secondary | ICD-10-CM | POA: Diagnosis not present

## 2020-01-22 DIAGNOSIS — Z5189 Encounter for other specified aftercare: Secondary | ICD-10-CM | POA: Diagnosis not present

## 2020-01-22 DIAGNOSIS — Z23 Encounter for immunization: Secondary | ICD-10-CM | POA: Diagnosis not present

## 2020-01-22 DIAGNOSIS — D6959 Other secondary thrombocytopenia: Secondary | ICD-10-CM | POA: Diagnosis not present

## 2020-01-22 DIAGNOSIS — C250 Malignant neoplasm of head of pancreas: Secondary | ICD-10-CM

## 2020-01-22 DIAGNOSIS — K831 Obstruction of bile duct: Secondary | ICD-10-CM | POA: Diagnosis not present

## 2020-01-22 DIAGNOSIS — Z5111 Encounter for antineoplastic chemotherapy: Secondary | ICD-10-CM | POA: Diagnosis not present

## 2020-01-22 DIAGNOSIS — Z452 Encounter for adjustment and management of vascular access device: Secondary | ICD-10-CM | POA: Diagnosis not present

## 2020-01-22 DIAGNOSIS — E039 Hypothyroidism, unspecified: Secondary | ICD-10-CM | POA: Diagnosis not present

## 2020-01-22 MED ORDER — HEPARIN SOD (PORK) LOCK FLUSH 100 UNIT/ML IV SOLN
500.0000 [IU] | Freq: Once | INTRAVENOUS | Status: AC | PRN
  Administered 2020-01-22: 500 [IU]
  Filled 2020-01-22: qty 5

## 2020-01-22 MED ORDER — PEGFILGRASTIM-CBQV 6 MG/0.6ML ~~LOC~~ SOSY
PREFILLED_SYRINGE | SUBCUTANEOUS | Status: AC
  Filled 2020-01-22: qty 0.6

## 2020-01-22 MED ORDER — SODIUM CHLORIDE 0.9% FLUSH
10.0000 mL | INTRAVENOUS | Status: DC | PRN
  Administered 2020-01-22: 10 mL
  Filled 2020-01-22: qty 10

## 2020-01-22 MED ORDER — PEGFILGRASTIM-CBQV 6 MG/0.6ML ~~LOC~~ SOSY
6.0000 mg | PREFILLED_SYRINGE | Freq: Once | SUBCUTANEOUS | Status: AC
  Administered 2020-01-22: 6 mg via SUBCUTANEOUS

## 2020-01-22 NOTE — Patient Instructions (Signed)

## 2020-01-26 DIAGNOSIS — C189 Malignant neoplasm of colon, unspecified: Secondary | ICD-10-CM | POA: Diagnosis not present

## 2020-01-28 ENCOUNTER — Ambulatory Visit: Payer: BC Managed Care – PPO

## 2020-01-28 ENCOUNTER — Other Ambulatory Visit: Payer: Self-pay

## 2020-01-28 ENCOUNTER — Inpatient Hospital Stay: Payer: BC Managed Care – PPO

## 2020-01-28 ENCOUNTER — Encounter (HOSPITAL_COMMUNITY): Payer: Self-pay

## 2020-01-28 ENCOUNTER — Ambulatory Visit (HOSPITAL_COMMUNITY)
Admission: RE | Admit: 2020-01-28 | Discharge: 2020-01-28 | Disposition: A | Discharge status: Home / Self Care | Payer: BC Managed Care – PPO | Source: Ambulatory Visit | Attending: Nurse Practitioner | Admitting: Nurse Practitioner

## 2020-01-28 DIAGNOSIS — C250 Malignant neoplasm of head of pancreas: Secondary | ICD-10-CM | POA: Diagnosis not present

## 2020-01-28 DIAGNOSIS — Z23 Encounter for immunization: Secondary | ICD-10-CM

## 2020-01-28 DIAGNOSIS — C259 Malignant neoplasm of pancreas, unspecified: Secondary | ICD-10-CM | POA: Diagnosis not present

## 2020-01-28 DIAGNOSIS — R590 Localized enlarged lymph nodes: Secondary | ICD-10-CM | POA: Diagnosis not present

## 2020-01-28 DIAGNOSIS — K838 Other specified diseases of biliary tract: Secondary | ICD-10-CM | POA: Diagnosis not present

## 2020-01-28 DIAGNOSIS — K8689 Other specified diseases of pancreas: Secondary | ICD-10-CM | POA: Diagnosis not present

## 2020-01-28 MED ORDER — IOHEXOL 300 MG/ML  SOLN
100.0000 mL | Freq: Once | INTRAMUSCULAR | Status: AC | PRN
  Administered 2020-01-28: 100 mL via INTRAVENOUS

## 2020-01-28 MED ORDER — HEPARIN SOD (PORK) LOCK FLUSH 100 UNIT/ML IV SOLN
500.0000 [IU] | Freq: Once | INTRAVENOUS | Status: AC
  Administered 2020-01-28: 500 [IU] via INTRAVENOUS

## 2020-01-28 MED ORDER — HEPARIN SOD (PORK) LOCK FLUSH 100 UNIT/ML IV SOLN
INTRAVENOUS | Status: AC
  Filled 2020-01-28: qty 5

## 2020-01-28 NOTE — Progress Notes (Signed)
   Covid-19 Vaccination Clinic  Name:  Breanna Noble    MRN: [id number] DOB: November 02, 1960  01/28/2020  [name] was observed post Covid-19 immunization for 15 minutes without incident. She was provided with Vaccine Information Sheet and instruction to access the V-Safe system.   [name] was instructed to call 911 with any severe reactions post vaccine: Marland Kitchen Difficulty breathing  . Swelling of face and throat  . A fast heartbeat  . A bad rash all over body  . Dizziness and weakness   Immunizations Administered    Name Date Dose VIS Date Route   Pfizer COVID-19 Vaccine 01/28/2020  1:55 PM 0.3 mL 06/26/2018 Intramuscular   Manufacturer: [place]   Lot: [id number]   [place]: [id number]

## 2020-01-31 NOTE — Progress Notes (Signed)
Breanna Noble   [phone] [phone]   Clinic Follow up Note   Patient Care Team: Vernie Shanks, MD as PCP - General (Family Medicine) Fontaine, Belinda Block, MD (Inactive) as Consulting Physician (Gynecology) Truitt Merle, MD as Consulting Physician (Oncology) Jonnie Finner, RN as Registered Nurse Arta Silence, MD as Consulting Physician (Gastroenterology) Stark Klein, MD as Consulting Physician (General Surgery) Karie Mainland, RD as Dietitian (Nutrition)  Date of Service:  02/03/2020  CHIEF COMPLAINT: F/u of Pancreatic Cancer  SUMMARY OF ONCOLOGIC HISTORY: Oncology History Overview Note  Cancer Staging Pancreatic cancer [facility]) Staging form: Exocrine Pancreas, AJCC 8th Edition - Clinical stage from 10/16/2019: Stage IIB (cT2, cN1, cM0) - Signed by Truitt Merle, MD on 10/16/2019    Pancreatic cancer ([place])  10/03/2019 Imaging   US Abdomen 10/03/19  IMPRESSION: Markedly dilated bile ducts. Dilated gallbladder with sludge but no gallstones.   2.9 cm mass in the uncinate process most likely pancreatic neoplasm causing biliary obstruction. Recommend MRI of the pancreas without with contrast for further evaluation. If the patient cannot have MRI, CT of the pancreas with contrast recommended.   10/07/2019 Imaging   MRI Abdomen 10/07/19  IMPRESSION: 1. There is a hypoenhancing mass of the pancreatic uncinate with abrupt obstruction of the central common bile duct, measuring approximately 2.9 cm, consistent with pancreatic adenocarcinoma. 2. Gross intra and extrahepatic biliary ductal dilatation, common bile duct measuring 1.4 cm. Gallbladder hydrops. 3. The pancreatic duct is nondilated. 4. Pancreatic mass lies closely adjacent to the most central portions of the superior mesenteric vein and central portal vein, due to motion artifact it is difficult to clearly discern whether there is a preserved fat plane. Multiphasic contrast  enhanced pancreatic protocol CT may be less motion sensitive and better delineate vascular structures for the purposes of surgical planning if necessary. 5. No evidence of lymphadenopathy or metastatic disease in the abdomen.   10/10/2019 Procedure   EUS by Dr Paulita Fujita 10/10/19  IMPRESSION - A mass was identified at junction of head/uncinate process of the pancreas. This was staged T2 N1 Mx by endosonographic criteria. Fine needle aspiration performed. - A few abnormal lymph nodes were visualized in the peripancreatic region. - There was dilation in the common bile duct which measured up to 14 mm. - Hyperechoic material consistent with sludge was visualized endosonographically in the gallbladder.   10/10/2019 Procedure   ERCP by Dr Paulita Fujita 10/10/19  IMPRESSION - The major papilla appeared normal. - One temporary stent was placed into the ventral pancreatic duct. - A biliary sphincterotomy was performed. - One covered metal stent was placed into the common bile duct.   10/10/2019 Initial Biopsy   FINAL MICROSCOPIC DIAGNOSIS: 10/10/19  A. PANCREAS, HEAD, FINE NEEDLE ASPIRATION:  - Malignant cells present, consistent with adenocarcinoma    10/16/2019 Initial Diagnosis   Pancreatic cancer ([place])   10/16/2019 Cancer Staging   Staging form: Exocrine Pancreas, AJCC 8th Edition - Clinical stage from 10/16/2019: Stage IIB (cT2, cN1, cM0) - Signed by Truitt Merle, MD on 10/16/2019   10/23/2019 Imaging   CT CAP W contrast  IMPRESSION: 1. Pancreatic mass with local adenopathy and potential retroperitoneal adenopathy. Given retroperitoneal lymph nodes and presence of venous collaterals PET scan may be useful for staging purposes. 2. No definite vascular involvement or signs of upper abdominal venous collaterals. Some stranding around the celiac may be due to recent biopsy. 3. Borderline enlarged RIGHT external iliac lymph node. Attention on follow-up. 4. No evidence  of metastatic disease in the  chest. 5. Signs of recent ERCP and stent placement.   10/25/2019 Procedure   PAC placed    11/09/2019 -  Chemotherapy   Neoadjuvant FOLFIRINOX q2weeks starting 11/09/19 for 8 cycles   11/28/2019 Genetic Testing   Negative genetic testing on the common hereditary cancer panel, pancreatic cancer panel, preliminary evidence pancreatic cancer panel and chronic pancreatitis panel.  The Common Hereditary Gene Panel offered by [facility] includes sequencing and/or deletion duplication testing of the following 55 genes: APC*, ATM*, AXIN2, BARD1, BMPR1A, BRCA1, BRCA2, BRIP1, CASR, CDH1, CDK4, CDKN2A (p14ARF), CDKN2A (p16INK4a), CFTR*, CHEK2, CPA1, CTNNA1, CTRC, DICER1*, EPCAM*, FANCC, GREM1*, HOXB13, KIT, MEN1*, MLH1*, MSH2*, MSH3*, MSH6*, MUTYH, NBN, NF1*, NTHL1, PALB2, PALLD, PDGFRA, PMS2*, POLD1*, POLE, PRSS1*, PTEN*, RAD50, RAD51C, RAD51D, SDHA*, SDHB, SDHC*, SDHD, SMAD4, SMARCA4, SPINK1, STK11, TP53, TSC1*, TSC2, and VHL .  The report date is November 28, 2019.   01/28/2020 Imaging   CT AP w contrast  IMPRESSION: 1. No substantial interval change in exam. 2. Similar appearance of the pancreatic mass with associated main pancreatic ductal dilatation. 3. Soft tissue stranding again noted around the celiac axis, in the para-aortic retroperitoneal space and along the IMA (new in the interval). These findings are associated with similar appearance of upper normal to borderline hepatoduodenal ligament and retroperitoneal lymphadenopathy. Close continued attention on follow-up recommended. 4. Borderline enlarged right external iliac node identified on previous study has decreased in size in the interval.      CURRENT THERAPY:  Neoadjuvant FOLFIRINOX q2weeks starting 11/09/19 for 8 cycles  INTERVAL HISTORY:  Breanna Noble is here for a follow up and treatment. She presents to the clinic alone. She notes she is doing well. She notes she was able to manage her previous cycle chemo. She notes having cold  sensitivity last 1 week and only 1 day of neuropathy outside of that. She denies nausea or diarrhea. She is able to eat adequately. She has tried to increase boost supplement.      REVIEW OF SYSTEMS:   Constitutional: Denies fevers, chills or abnormal weight loss Eyes: Denies blurriness of vision Ears, nose, mouth, throat, and face: Denies mucositis or sore throat Respiratory: Denies cough, dyspnea or wheezes Cardiovascular: Denies palpitation, chest discomfort or lower extremity swelling Gastrointestinal:  Denies nausea, heartburn or change in bowel habits Skin: Denies abnormal skin rashes Lymphatics: Denies new lymphadenopathy or easy bruising Neurological:Denies numbness, tingling or new weaknesses Behavioral/Psych: Mood is stable, no new changes  All other systems were reviewed with the patient and are negative.  MEDICAL HISTORY:  Past Medical History:  Diagnosis Date  . Cataracts, bilateral   . Diverticulosis 12/07/2001   found on colonoscopy  . Family history of colon cancer   . Family history of pancreatic cancer   . Hypothyroid 10/2007  . Macular hole   . Pancreatic mass   . PONV (postoperative nausea and vomiting)     SURGICAL HISTORY: Past Surgical History:  Procedure Laterality Date  . BILIARY STENT PLACEMENT  10/10/2019   Procedure: BILIARY STENT PLACEMENT;  Surgeon: Arta Silence, MD;  Location: [place];  Service: Endoscopy;;  . CATARACT EXTRACTION, BILATERAL    . COLONOSCOPY    . ERCP N/A 10/10/2019   Procedure: ENDOSCOPIC RETROGRADE CHOLANGIOPANCREATOGRAPHY (ERCP);  Surgeon: Arta Silence, MD;  Location: [facility] ENDOSCOPY;  Service: Endoscopy;  Laterality: N/A;  . ESOPHAGOGASTRODUODENOSCOPY N/A 10/10/2019   Procedure: ESOPHAGOGASTRODUODENOSCOPY (EGD);  Surgeon: Arta Silence, MD;  Location: [facility] ENDOSCOPY;  Service: Endoscopy;  Laterality:  N/A;  . EUS N/A 10/10/2019   Procedure: UPPER ENDOSCOPIC ULTRASOUND (EUS) LINEAR;  Surgeon: Arta Silence, MD;   Location: [place];  Service: Endoscopy;  Laterality: N/A;  . FINE NEEDLE ASPIRATION  10/10/2019   Procedure: FINE NEEDLE ASPIRATION (FNA) LINEAR;  Surgeon: Arta Silence, MD;  Location: [place];  Service: Endoscopy;;  . IR IMAGING GUIDED PORT INSERTION  10/25/2019  . Macular hole surg    . PANCREATIC STENT PLACEMENT  10/10/2019   Procedure: PANCREATIC STENT PLACEMENT;  Surgeon: Arta Silence, MD;  Location: [facility] ENDOSCOPY;  Service: Endoscopy;;  . SPHINCTEROTOMY  10/10/2019   Procedure: SPHINCTEROTOMY;  Surgeon: Arta Silence, MD;  Location: [facility] ENDOSCOPY;  Service: Endoscopy;;  . TONSILLECTOMY      I have reviewed the social history and family history with the patient and they are unchanged from previous note.  ALLERGIES:  has No Known Allergies.  MEDICATIONS:  Current Outpatient Medications  Medication Sig Dispense Refill  . Ascorbic Acid (VITAMIN C PO) Take 1,000 mg by mouth daily.     . B Complex Vitamins (B-COMPLEX/B-12 PO) Take by mouth.    . Calcium Carb-Cholecalciferol (CALCIUM 1000 + D PO) Take by mouth.    . diphenhydrAMINE (BENADRYL) 25 MG tablet Take 25 mg by mouth at bedtime.    . ferrous sulfate 325 (65 FE) MG tablet Take 325 mg by mouth daily with breakfast.    . levothyroxine (SYNTHROID, LEVOTHROID) 50 MCG tablet Take 1 tablet (50 mcg total) by mouth daily. 90 tablet 4  . lidocaine-prilocaine (EMLA) cream Apply to affected area once 30 g 3  . LORazepam (ATIVAN) 0.5 MG tablet Take 1 tablet (0.5 mg total) by mouth daily as needed for anxiety. For anticipatory n/v prior to chemo treatments 15 tablet 0  . Multiple Vitamin (MULTIVITAMIN PO) Take by mouth.      . mupirocin ointment (BACTROBAN) 2 % Place 1 application into the nose daily as needed (sore in nose).    . ondansetron (ZOFRAN) 4 MG tablet Take 4 mg by mouth every 8 (eight) hours as needed.    . ondansetron (ZOFRAN) 8 MG tablet Take 1 tablet (8 mg total) by mouth 2 (two) times daily as needed. Start on day 3  after chemotherapy. 30 tablet 1  . prochlorperazine (COMPAZINE) 10 MG tablet TAKE 1 TABLET BY MOUTH EVERY 6 HOURS AS NEEDED FOR NAUSEA AND VOMITING 30 tablet 0   No current facility-administered medications for this visit.   Facility-Administered Medications Ordered in Other Visits  Medication Dose Route Frequency Provider Last Rate Last Admin  . fluorouracil (ADRUCIL) 3,950 mg in sodium chloride 0.9 % 71 mL chemo infusion  2,400 mg/m2 (Treatment Plan Recorded) Intravenous 1 day or 1 dose Truitt Merle, MD      . irinotecan (CAMPTOSAR) 240 mg in sodium chloride 0.9 % 500 mL chemo infusion  150 mg/m2 (Treatment Plan Recorded) Intravenous Once Truitt Merle, MD      . leucovorin 660 mg in sodium chloride 0.9 % 250 mL infusion  400 mg/m2 (Treatment Plan Recorded) Intravenous Once Truitt Merle, MD      . oxaliplatin (ELOXATIN) 115 mg in dextrose 5 % 500 mL chemo infusion  70 mg/m2 (Treatment Plan Recorded) Intravenous Once Truitt Merle, MD      . sodium chloride flush (NS) 0.9 % injection 10 mL  10 mL Intracatheter PRN Truitt Merle, MD        PHYSICAL EXAMINATION: ECOG PERFORMANCE STATUS: 1 - Symptomatic but completely ambulatory  Vitals:  02/03/20 1115  BP: (!) 121/55  Pulse: 80  Temp: (!) 96.5 F (35.8 C)  SpO2: 100%   Filed Weights   02/03/20 1115  Weight: 129 lb 9.6 oz (58.8 kg)    Due to COVID19 we will limit examination to appearance. Patient had no complaints.  GENERAL:alert, no distress and comfortable SKIN: skin color normal, no rashes or significant lesions EYES: normal, Conjunctiva are pink and non-injected, sclera clear  NEURO: alert & oriented x 3 with fluent speech    LABORATORY DATA:  I have reviewed the data as listed CBC Latest Ref Rng & Units 02/03/2020 01/20/2020 01/07/2020  WBC 4.0 - 10.5 K/uL 13.5(H) 10.4 11.1(H)  Hemoglobin 12.0 - 15.0 g/dL 11.6(L) 12.1 11.9(L)  Hematocrit 36 - 46 % 36.2 37.9 36.9  Platelets 150 - 400 K/uL 99(L) 111(L) 129(L)     CMP Latest Ref Rng &  Units 02/03/2020 01/20/2020 01/07/2020  Glucose 70 - 99 mg/dL 102(H) 89 111(H)  BUN 6 - 20 mg/dL 12 10 13   Creatinine 0.44 - 1.00 mg/dL 0.63 0.62 0.63  Sodium 135 - 145 mmol/L 135 137 135  Potassium 3.5 - 5.1 mmol/L 4.1 4.0 4.0  Chloride 98 - 111 mmol/L 102 103 102  CO2 22 - 32 mmol/L 28 27 29   Calcium 8.9 - 10.3 mg/dL 9.5 9.6 9.4  Total Protein 6.5 - 8.1 g/dL 7.8 8.0 7.8  Total Bilirubin 0.3 - 1.2 mg/dL 0.3 0.3 0.3  Alkaline Phos 38 - 126 U/L 205(H) 223(H) 176(H)  AST 15 - 41 U/L 41 39 36  ALT 0 - 44 U/L 33 31 30      RADIOGRAPHIC STUDIES: I have personally reviewed the radiological images as listed and agreed with the findings in the report. No results found.   ASSESSMENT & PLAN:  Breanna Noble is a 59 y.o. female with    1. Pancreatic Cancer, Stage IIB, cT2N1Mx -She was diagnosed in 10/2019.Her initialUS showed bile duct and gallbladder dilation due to 2-3cm mass at head of pancreas/Uncinate. Her MRI showed her mass is laying close to SMV but not involving it. Her CT CAP from 10/23/19 showedborderline potential adenopathy in the periaortic region which will be followed on restaging scans. There is no definite vascular involvementor distant metastasis. -She underwent EUS and ERCP on 10/10/19 and had 2 stents placed by Dr Paulita Fujita. Herpancreatic mass biopsyshowed Adenocarcinoma of Pancreas.Endoscopic staging T2N1, noevidence of vascular involvement. -I started her onneoadjuvant chemo with FOLFIRINOX every 2 weeks for 8 cycles over 4 months beginning 11/09/19.If she responds well enough she will proceed with surgery. -I discussed if surgery is not offered, another option is consolidation radiation after 4-6 months chemo. These are not curative so, more systemic treatments will offered to control her disease and prolong her life.  -I personally reviewed and discussed her CT AP from 01/28/20 which showed stable primary tumor and indeterminated soft tissue around celiac axis.   Retroperitoneal and distant LNs are smaller. Will review and discuss her scan in GI Tumor Board this week. We may suggest additional imaging if indicated.  -She will f/u with Dr Barry Dienes this week about surgery.  -s/p C6 she is tolerating moderately well with cold sensitivity and minimal temporary neuropathy. Labs reviewed, WBC 13.5, HG 11.6, PLT 99K, ANC 8.3. Overall adequate to proceed with C7 FOLFIRINOX today with decreased oxaliplatin dose given thrombocytopenia and initial neuropathy.  -F/u in 2 weeks    2. Obstructive Jaundice, s/p2stent placements (now resolved), Elevated Alk Phos  -I  encouraged her to watch for signs of Jaundice, fever or RUQ pain. -Alk Phos has been elevated lately and increased to 223 today (01/20/20). I discussed chemo can contribute to this.   3. Upper Abdominal Pain, Weight loss, Nausea -Presented with initial jaundice but since stent placement her pain has mostly resolvedand only occasional. -Nausea from chemo is controlled on Compazine and Zofran. Her weight is maintained on Boost and premier protein and adequate eating.If she loses more weight I recommend she increase supplements.  -She will continue to f/u with dietician.Stable.   4. Genetic Testing in 10/2019 was negative for pathogenetic mutations.   5. Thrombocytopenia, secondary to chemo  -plt 99K today (02/03/20). Will dose reduce oxaliplatin     PLAN: -CT AP reviewed, mostly stable.  -Labs reviewed and adequate to proceed with C7FOLFIRINOX with dose reduced oxaliplatin today to 71m/m2 due to thrombocytopenia, GCSF on day 3 -Lab, flush, f/uand chem in 2, 4weeks -tumor conference discussion this week, will call her after    No problem-specific Assessment & Plan notes found for this encounter.   No orders of the defined types were placed in this encounter.  All questions were answered. The patient knows to call the clinic with any problems, questions or concerns. No barriers to  learning was detected. The total time spent in the appointment was 30 minutes.     YTruitt Merle MD 02/03/2020   I, AJoslyn Devon am acting as scribe for YTruitt Merle MD.   I have reviewed the above documentation for accuracy and completeness, and I agree with the above.

## 2020-02-03 ENCOUNTER — Encounter: Payer: Self-pay | Admitting: Hematology

## 2020-02-03 ENCOUNTER — Inpatient Hospital Stay (HOSPITAL_BASED_OUTPATIENT_CLINIC_OR_DEPARTMENT_OTHER): Payer: BC Managed Care – PPO | Admitting: Hematology

## 2020-02-03 ENCOUNTER — Other Ambulatory Visit: Payer: Self-pay

## 2020-02-03 ENCOUNTER — Inpatient Hospital Stay: Payer: BC Managed Care – PPO | Admitting: Nutrition

## 2020-02-03 ENCOUNTER — Inpatient Hospital Stay: Payer: BC Managed Care – PPO | Attending: Hematology

## 2020-02-03 ENCOUNTER — Inpatient Hospital Stay: Payer: BC Managed Care – PPO

## 2020-02-03 VITALS — BP 121/55 | HR 80 | Temp 96.5°F | Ht 65.0 in | Wt 129.6 lb

## 2020-02-03 DIAGNOSIS — D6959 Other secondary thrombocytopenia: Secondary | ICD-10-CM | POA: Insufficient documentation

## 2020-02-03 DIAGNOSIS — Z452 Encounter for adjustment and management of vascular access device: Secondary | ICD-10-CM | POA: Diagnosis not present

## 2020-02-03 DIAGNOSIS — Z95828 Presence of other vascular implants and grafts: Secondary | ICD-10-CM

## 2020-02-03 DIAGNOSIS — Z23 Encounter for immunization: Secondary | ICD-10-CM | POA: Insufficient documentation

## 2020-02-03 DIAGNOSIS — Z5111 Encounter for antineoplastic chemotherapy: Secondary | ICD-10-CM | POA: Insufficient documentation

## 2020-02-03 DIAGNOSIS — Z5189 Encounter for other specified aftercare: Secondary | ICD-10-CM | POA: Diagnosis not present

## 2020-02-03 DIAGNOSIS — C189 Malignant neoplasm of colon, unspecified: Secondary | ICD-10-CM | POA: Diagnosis not present

## 2020-02-03 DIAGNOSIS — R1010 Upper abdominal pain, unspecified: Secondary | ICD-10-CM | POA: Insufficient documentation

## 2020-02-03 DIAGNOSIS — C250 Malignant neoplasm of head of pancreas: Secondary | ICD-10-CM | POA: Diagnosis not present

## 2020-02-03 DIAGNOSIS — K831 Obstruction of bile duct: Secondary | ICD-10-CM | POA: Insufficient documentation

## 2020-02-03 DIAGNOSIS — C257 Malignant neoplasm of other parts of pancreas: Secondary | ICD-10-CM | POA: Insufficient documentation

## 2020-02-03 LAB — CBC WITH DIFFERENTIAL (CANCER CENTER ONLY)
Abs Immature Granulocytes: 0.65 10*3/uL — ABNORMAL HIGH (ref 0.00–0.07)
Basophils Absolute: 0.1 10*3/uL (ref 0.0–0.1)
Basophils Relative: 1 %
Eosinophils Absolute: 0.1 10*3/uL (ref 0.0–0.5)
Eosinophils Relative: 1 %
HCT: 36.2 % (ref 36.0–46.0)
Hemoglobin: 11.6 g/dL — ABNORMAL LOW (ref 12.0–15.0)
Immature Granulocytes: 5 %
Lymphocytes Relative: 16 %
Lymphs Abs: 2.1 10*3/uL (ref 0.7–4.0)
MCH: 28.9 pg (ref 26.0–34.0)
MCHC: 32 g/dL (ref 30.0–36.0)
MCV: 90 fL (ref 80.0–100.0)
Monocytes Absolute: 2.3 10*3/uL — ABNORMAL HIGH (ref 0.1–1.0)
Monocytes Relative: 17 %
Neutro Abs: 8.3 10*3/uL — ABNORMAL HIGH (ref 1.7–7.7)
Neutrophils Relative %: 60 %
Platelet Count: 99 10*3/uL — ABNORMAL LOW (ref 150–400)
RBC: 4.02 MIL/uL (ref 3.87–5.11)
RDW: 17.5 % — ABNORMAL HIGH (ref 11.5–15.5)
WBC Count: 13.5 10*3/uL — ABNORMAL HIGH (ref 4.0–10.5)
nRBC: 0 % (ref 0.0–0.2)

## 2020-02-03 LAB — CMP (CANCER CENTER ONLY)
ALT: 33 U/L (ref 0–44)
AST: 41 U/L (ref 15–41)
Albumin: 3.5 g/dL (ref 3.5–5.0)
Alkaline Phosphatase: 205 U/L — ABNORMAL HIGH (ref 38–126)
Anion gap: 5 (ref 5–15)
BUN: 12 mg/dL (ref 6–20)
CO2: 28 mmol/L (ref 22–32)
Calcium: 9.5 mg/dL (ref 8.9–10.3)
Chloride: 102 mmol/L (ref 98–111)
Creatinine: 0.63 mg/dL (ref 0.44–1.00)
GFR, Est AFR Am: >60 mL/min (ref >60)
GFR, Estimated: >60 mL/min (ref >60)
Glucose, Bld: 102 mg/dL — ABNORMAL HIGH (ref 70–99)
Potassium: 4.1 mmol/L (ref 3.5–5.1)
Sodium: 135 mmol/L (ref 135–145)
Total Bilirubin: 0.3 mg/dL (ref 0.3–1.2)
Total Protein: 7.8 g/dL (ref 6.5–8.1)

## 2020-02-03 MED ORDER — SODIUM CHLORIDE 0.9% FLUSH
10.0000 mL | INTRAVENOUS | Status: DC | PRN
  Filled 2020-02-03: qty 10

## 2020-02-03 MED ORDER — SODIUM CHLORIDE 0.9 % IV SOLN
150.0000 mg | Freq: Once | INTRAVENOUS | Status: AC
  Administered 2020-02-03: 150 mg via INTRAVENOUS
  Filled 2020-02-03: qty 150

## 2020-02-03 MED ORDER — ATROPINE SULFATE 1 MG/ML IJ SOLN
INTRAMUSCULAR | Status: AC
  Filled 2020-02-03: qty 1

## 2020-02-03 MED ORDER — OXALIPLATIN CHEMO INJECTION 100 MG/20ML
70.0000 mg/m2 | Freq: Once | INTRAVENOUS | Status: AC
  Administered 2020-02-03: 115 mg via INTRAVENOUS
  Filled 2020-02-03: qty 20

## 2020-02-03 MED ORDER — SODIUM CHLORIDE 0.9 % IV SOLN
150.0000 mg/m2 | Freq: Once | INTRAVENOUS | Status: AC
  Administered 2020-02-03: 240 mg via INTRAVENOUS
  Filled 2020-02-03: qty 12

## 2020-02-03 MED ORDER — SODIUM CHLORIDE 0.9% FLUSH
10.0000 mL | Freq: Once | INTRAVENOUS | Status: AC
  Administered 2020-02-03: 10 mL
  Filled 2020-02-03: qty 10

## 2020-02-03 MED ORDER — PALONOSETRON HCL INJECTION 0.25 MG/5ML
INTRAVENOUS | Status: AC
  Filled 2020-02-03: qty 5

## 2020-02-03 MED ORDER — DEXTROSE 5 % IV SOLN
Freq: Once | INTRAVENOUS | Status: AC
  Filled 2020-02-03: qty 250

## 2020-02-03 MED ORDER — SODIUM CHLORIDE 0.9 % IV SOLN
2400.0000 mg/m2 | INTRAVENOUS | Status: DC
  Administered 2020-02-03: 3950 mg via INTRAVENOUS
  Filled 2020-02-03: qty 79

## 2020-02-03 MED ORDER — SODIUM CHLORIDE 0.9 % IV SOLN
10.0000 mg | Freq: Once | INTRAVENOUS | Status: AC
  Administered 2020-02-03: 10 mg via INTRAVENOUS
  Filled 2020-02-03: qty 10

## 2020-02-03 MED ORDER — ATROPINE SULFATE 1 MG/ML IJ SOLN
0.5000 mg | Freq: Once | INTRAMUSCULAR | Status: AC | PRN
  Administered 2020-02-03: 0.5 mg via INTRAVENOUS

## 2020-02-03 MED ORDER — SODIUM CHLORIDE 0.9 % IV SOLN
400.0000 mg/m2 | Freq: Once | INTRAVENOUS | Status: AC
  Administered 2020-02-03: 660 mg via INTRAVENOUS
  Filled 2020-02-03: qty 33

## 2020-02-03 MED ORDER — PALONOSETRON HCL INJECTION 0.25 MG/5ML
0.2500 mg | Freq: Once | INTRAVENOUS | Status: AC
  Administered 2020-02-03: 0.25 mg via INTRAVENOUS

## 2020-02-03 NOTE — Progress Notes (Signed)
Nutrition follow-up completed with patient during infusion for pancreas cancer. Weight is stable and was documented as 129.6 pounds October 4. Patient has been trying to add in more fish. Patient experiences nausea but medications help control. Patient is tearful today.  Reports this has been a tough day.  Nutrition diagnosis: Food and nutrition related knowledge deficit improved.  Intervention: Reviewed strategies for adding in easy to prepare high-protein foods. Provided support and active listening.   Monitoring, evaluation, goals: Patient will tolerate adequate calories and protein to minimize weight loss.  Next visit: To be scheduled with upcoming treatment as needed.  **Disclaimer: This note was dictated with voice recognition software. Similar sounding words can inadvertently be transcribed and this note may contain transcription errors which may not have been corrected upon publication of note.**

## 2020-02-03 NOTE — Patient Instructions (Signed)
[facility] Discharge Instructions for Patients Receiving Chemotherapy  Today you received the following chemotherapy agents: Oxaliplatin, Irinotecan. Leucovorin, 5FU  To help prevent nausea and vomiting after your treatment, we encourage you to take your nausea medication as directed.    If you develop nausea and vomiting that is not controlled by your nausea medication, call the clinic.   BELOW ARE SYMPTOMS THAT SHOULD BE REPORTED IMMEDIATELY:  *FEVER GREATER THAN 100.5 F  *CHILLS WITH OR WITHOUT FEVER  NAUSEA AND VOMITING THAT IS NOT CONTROLLED WITH YOUR NAUSEA MEDICATION  *UNUSUAL SHORTNESS OF BREATH  *UNUSUAL BRUISING OR BLEEDING  TENDERNESS IN MOUTH AND THROAT WITH OR WITHOUT PRESENCE OF ULCERS  *URINARY PROBLEMS  *BOWEL PROBLEMS  UNUSUAL RASH Items with * indicate a potential emergency and should be followed up as soon as possible.  Feel free to call the clinic should you have any questions or concerns. The clinic phone number is [phone].  Please show the CHEMO ALERT CARD at check-in to the Emergency Department and triage nurse.   

## 2020-02-05 ENCOUNTER — Other Ambulatory Visit: Payer: Self-pay

## 2020-02-05 ENCOUNTER — Telehealth: Payer: Self-pay | Admitting: Hematology

## 2020-02-05 ENCOUNTER — Other Ambulatory Visit: Payer: Self-pay | Admitting: Hematology

## 2020-02-05 ENCOUNTER — Inpatient Hospital Stay: Payer: BC Managed Care – PPO

## 2020-02-05 VITALS — BP 125/57 | HR 79 | Temp 99.8°F | Resp 18

## 2020-02-05 DIAGNOSIS — Z5189 Encounter for other specified aftercare: Secondary | ICD-10-CM | POA: Diagnosis not present

## 2020-02-05 DIAGNOSIS — C250 Malignant neoplasm of head of pancreas: Secondary | ICD-10-CM

## 2020-02-05 DIAGNOSIS — C257 Malignant neoplasm of other parts of pancreas: Secondary | ICD-10-CM | POA: Diagnosis not present

## 2020-02-05 DIAGNOSIS — K831 Obstruction of bile duct: Secondary | ICD-10-CM | POA: Diagnosis not present

## 2020-02-05 DIAGNOSIS — Z23 Encounter for immunization: Secondary | ICD-10-CM | POA: Diagnosis not present

## 2020-02-05 DIAGNOSIS — Z5111 Encounter for antineoplastic chemotherapy: Secondary | ICD-10-CM | POA: Diagnosis not present

## 2020-02-05 DIAGNOSIS — D6959 Other secondary thrombocytopenia: Secondary | ICD-10-CM | POA: Diagnosis not present

## 2020-02-05 DIAGNOSIS — R1010 Upper abdominal pain, unspecified: Secondary | ICD-10-CM | POA: Diagnosis not present

## 2020-02-05 DIAGNOSIS — Z452 Encounter for adjustment and management of vascular access device: Secondary | ICD-10-CM | POA: Diagnosis not present

## 2020-02-05 MED ORDER — SODIUM CHLORIDE 0.9% FLUSH
10.0000 mL | INTRAVENOUS | Status: DC | PRN
  Administered 2020-02-05: 10 mL
  Filled 2020-02-05: qty 10

## 2020-02-05 MED ORDER — PEGFILGRASTIM-CBQV 6 MG/0.6ML ~~LOC~~ SOSY
PREFILLED_SYRINGE | SUBCUTANEOUS | Status: AC
  Filled 2020-02-05: qty 0.6

## 2020-02-05 MED ORDER — HEPARIN SOD (PORK) LOCK FLUSH 100 UNIT/ML IV SOLN
500.0000 [IU] | Freq: Once | INTRAVENOUS | Status: AC | PRN
  Administered 2020-02-05: 500 [IU]
  Filled 2020-02-05: qty 5

## 2020-02-05 MED ORDER — PEGFILGRASTIM-CBQV 6 MG/0.6ML ~~LOC~~ SOSY
6.0000 mg | PREFILLED_SYRINGE | Freq: Once | SUBCUTANEOUS | Status: AC
  Administered 2020-02-05: 6 mg via SUBCUTANEOUS

## 2020-02-05 NOTE — Telephone Encounter (Signed)
Case discussed in GI conference this morning. I called pt and recommended PET scan for further evaluation of RP adenopathy. She agrees, she is scheduled to see Dr. Barry Dienes next Monday, which will likely be rescheduled after PET.   Breanna Noble  02/05/2020

## 2020-02-05 NOTE — Telephone Encounter (Signed)
No 10/4 los °

## 2020-02-05 NOTE — Patient Instructions (Signed)
Pegfilgrastim injection What is this medicine? PEGFILGRASTIM (PEG fil gra stim) is a long-acting granulocyte colony-stimulating factor that stimulates the growth of neutrophils, a type of white blood cell important in the body's fight against infection. It is used to reduce the incidence of fever and infection in patients with certain types of cancer who are receiving chemotherapy that affects the bone marrow, and to increase survival after being exposed to high doses of radiation. This medicine may be used for other purposes; ask your health care provider or pharmacist if you have questions. COMMON BRAND NAME(S): Fulphila, Neulasta, UDENYCA, Ziextenzo What should I tell my health care provider before I take this medicine? They need to know if you have any of these conditions:  kidney disease  latex allergy  ongoing radiation therapy  sickle cell disease  skin reactions to acrylic adhesives (On-Body Injector only)  an unusual or allergic reaction to pegfilgrastim, filgrastim, other medicines, foods, dyes, or preservatives  pregnant or trying to get pregnant  breast-feeding How should I use this medicine? This medicine is for injection under the skin. If you get this medicine at home, you will be taught how to prepare and give the pre-filled syringe or how to use the On-body Injector. Refer to the patient Instructions for Use for detailed instructions. Use exactly as directed. Tell your healthcare provider immediately if you suspect that the On-body Injector may not have performed as intended or if you suspect the use of the On-body Injector resulted in a missed or partial dose. It is important that you put your used needles and syringes in a special sharps container. Do not put them in a trash can. If you do not have a sharps container, call your pharmacist or healthcare provider to get one. Talk to your pediatrician regarding the use of this medicine in children. While this drug may be  prescribed for selected conditions, precautions do apply. Overdosage: If you think you have taken too much of this medicine contact a poison control center or emergency room at once. NOTE: This medicine is only for you. Do not share this medicine with others. What if I miss a dose? It is important not to miss your dose. Call your doctor or health care professional if you miss your dose. If you miss a dose due to an On-body Injector failure or leakage, a new dose should be administered as soon as possible using a single prefilled syringe for manual use. What may interact with this medicine? Interactions have not been studied. Give your health care provider a list of all the medicines, herbs, non-prescription drugs, or dietary supplements you use. Also tell them if you smoke, drink alcohol, or use illegal drugs. Some items may interact with your medicine. This list may not describe all possible interactions. Give your health care provider a list of all the medicines, herbs, non-prescription drugs, or dietary supplements you use. Also tell them if you smoke, drink alcohol, or use illegal drugs. Some items may interact with your medicine. What should I watch for while using this medicine? You may need blood work done while you are taking this medicine. If you are going to need a MRI, CT scan, or other procedure, tell your doctor that you are using this medicine (On-Body Injector only). What side effects may I notice from receiving this medicine? Side effects that you should report to your doctor or health care professional as soon as possible:  allergic reactions like skin rash, itching or hives, swelling of the   face, lips, or tongue  back pain  dizziness  fever  pain, redness, or irritation at site where injected  pinpoint red spots on the skin  red or dark-brown urine  shortness of breath or breathing problems  stomach or side pain, or pain at the  shoulder  swelling  tiredness  trouble passing urine or change in the amount of urine Side effects that usually do not require medical attention (report to your doctor or health care professional if they continue or are bothersome):  bone pain  muscle pain This list may not describe all possible side effects. Call your doctor for medical advice about side effects. You may report side effects to FDA at 1-800-FDA-1088. Where should I keep my medicine? Keep out of the reach of children. If you are using this medicine at home, you will be instructed on how to store it. Throw away any unused medicine after the expiration date on the label. NOTE: This sheet is a summary. It may not cover all possible information. If you have questions about this medicine, talk to your doctor, pharmacist, or health care provider.  2020 [employer] Standard (2017-07-24 16:57:08) Implanted Port Home Guide An implanted port is a device that is placed under the skin. It is usually placed in the chest. The device can be used to give IV medicine, to take blood, or for dialysis. You may have an implanted port if:  You need IV medicine that would be irritating to the small veins in your hands or arms.  You need IV medicines, such as antibiotics, for a long period of time.  You need IV nutrition for a long period of time.  You need dialysis. Having a port means that your health care provider will not need to use the veins in your arms for these procedures. You may have fewer limitations when using a port than you would if you used other types of long-term IVs, and you will likely be able to return to normal activities after your incision heals. An implanted port has two main parts:  Reservoir. The reservoir is the part where a needle is inserted to give medicines or draw blood. The reservoir is round. After it is placed, it appears as a small, raised area under your skin.  Catheter. The catheter is a thin,  flexible tube that connects the reservoir to a vein. Medicine that is inserted into the reservoir goes into the catheter and then into the vein. How is my port accessed? To access your port:  A numbing cream may be placed on the skin over the port site.  Your health care provider will put on a mask and sterile gloves.  The skin over your port will be cleaned carefully with a germ-killing soap and allowed to dry.  Your health care provider will gently pinch the port and insert a needle into it.  Your health care provider will check for a blood return to make sure the port is in the vein and is not clogged.  If your port needs to remain accessed to get medicine continuously (constant infusion), your health care provider will place a clear bandage (dressing) over the needle site. The dressing and needle will need to be changed every week, or as told by your health care provider. What is flushing? Flushing helps keep the port from getting clogged. Follow instructions from your health care provider about how and when to flush the port. Ports are usually flushed with saline solution or a medicine   called heparin. The need for flushing will depend on how the port is used:  If the port is only used from time to time to give medicines or draw blood, the port may need to be flushed: ? Before and after medicines have been given. ? Before and after blood has been drawn. ? As part of routine maintenance. Flushing may be recommended every 4-6 weeks.  If a constant infusion is running, the port may not need to be flushed.  Throw away any syringes in a disposal container that is meant for sharp items (sharps container). You can buy a sharps container from a pharmacy, or you can make one by using an empty hard plastic bottle with a cover. How long will my port stay implanted? The port can stay in for as long as your health care provider thinks it is needed. When it is time for the port to come out, a  surgery will be done to remove it. The surgery will be similar to the procedure that was done to put the port in. Follow these instructions at home:   Flush your port as told by your health care provider.  If you need an infusion over several days, follow instructions from your health care provider about how to take care of your port site. Make sure you: ? Wash your hands with soap and water before you change your dressing. If soap and water are not available, use alcohol-based hand sanitizer. ? Change your dressing as told by your health care provider. ? Place any used dressings or infusion bags into a plastic bag. Throw that bag in the trash. ? Keep the dressing that covers the needle clean and dry. Do not get it wet. ? Do not use scissors or sharp objects near the tube. ? Keep the tube clamped, unless it is being used.  Check your port site every day for signs of infection. Check for: ? Redness, swelling, or pain. ? Fluid or blood. ? Pus or a bad smell.  Protect the skin around the port site. ? Avoid wearing bra straps that rub or irritate the site. ? Protect the skin around your port from seat belts. Place a soft pad over your chest if needed.  Bathe or shower as told by your health care provider. The site may get wet as long as you are not actively receiving an infusion.  Return to your normal activities as told by your health care provider. Ask your health care provider what activities are safe for you.  Carry a medical alert card or wear a medical alert bracelet at all times. This will let health care providers know that you have an implanted port in case of an emergency. Get help right away if:  You have redness, swelling, or pain at the port site.  You have fluid or blood coming from your port site.  You have pus or a bad smell coming from the port site.  You have a fever. Summary  Implanted ports are usually placed in the chest for long-term IV access.  Follow  instructions from your health care provider about flushing the port and changing bandages (dressings).  Take care of the area around your port by avoiding clothing that puts pressure on the area, and by watching for signs of infection.  Protect the skin around your port from seat belts. Place a soft pad over your chest if needed.  Get help right away if you have a fever or you have redness,   swelling, pain, drainage, or a bad smell at the port site. This information is not intended to replace advice given to you by your health care provider. Make sure you discuss any questions you have with your health care provider. Document Revised: 08/10/2018 Document Reviewed: 05/21/2016 Elsevier Patient Education  2020 [employer]  

## 2020-02-14 ENCOUNTER — Other Ambulatory Visit: Payer: Self-pay

## 2020-02-14 ENCOUNTER — Encounter (HOSPITAL_COMMUNITY)
Admission: RE | Admit: 2020-02-14 | Discharge: 2020-02-14 | Disposition: A | Discharge status: Home / Self Care | Payer: BC Managed Care – PPO | Source: Ambulatory Visit | Attending: Hematology | Admitting: Hematology

## 2020-02-14 DIAGNOSIS — C250 Malignant neoplasm of head of pancreas: Secondary | ICD-10-CM | POA: Diagnosis not present

## 2020-02-14 DIAGNOSIS — C259 Malignant neoplasm of pancreas, unspecified: Secondary | ICD-10-CM | POA: Diagnosis not present

## 2020-02-14 LAB — GLUCOSE, CAPILLARY: Glucose-Capillary: 101 mg/dL — ABNORMAL HIGH (ref 70–99)

## 2020-02-14 MED ORDER — FLUDEOXYGLUCOSE F - 18 (FDG) INJECTION
7.0000 | Freq: Once | INTRAVENOUS | Status: AC | PRN
  Administered 2020-02-14: 7 via INTRAVENOUS

## 2020-02-17 ENCOUNTER — Other Ambulatory Visit: Payer: Self-pay | Admitting: General Surgery

## 2020-02-17 DIAGNOSIS — C250 Malignant neoplasm of head of pancreas: Secondary | ICD-10-CM | POA: Diagnosis not present

## 2020-02-17 NOTE — Progress Notes (Signed)
[name]   [phone] [phone]   Clinic Follow up Note   Patient Care Team: Vernie Shanks, MD as PCP - General (Family Medicine) Fontaine, Belinda Block, MD (Inactive) as Consulting Physician (Gynecology) Truitt Merle, MD as Consulting Physician (Oncology) Jonnie Finner, RN as Registered Nurse Arta Silence, MD as Consulting Physician (Gastroenterology) Stark Klein, MD as Consulting Physician (General Surgery) Karie Mainland, RD as Dietitian (Nutrition) 02/18/2020  CHIEF COMPLAINT: Follow-up pancreas cancer  SUMMARY OF ONCOLOGIC HISTORY: Oncology History Overview Note  Cancer Staging Pancreatic cancer [facility]) Staging form: Exocrine Pancreas, AJCC 8th Edition - Clinical stage from 10/16/2019: Stage IIB (cT2, cN1, cM0) - Signed by Truitt Merle, MD on 10/16/2019    Pancreatic cancer ([place])  10/03/2019 Imaging   US Abdomen 10/03/19  IMPRESSION: Markedly dilated bile ducts. Dilated gallbladder with sludge but no gallstones.   2.9 cm mass in the uncinate process most likely pancreatic neoplasm causing biliary obstruction. Recommend MRI of the pancreas without with contrast for further evaluation. If the patient cannot have MRI, CT of the pancreas with contrast recommended.   10/07/2019 Imaging   MRI Abdomen 10/07/19  IMPRESSION: 1. There is a hypoenhancing mass of the pancreatic uncinate with abrupt obstruction of the central common bile duct, measuring approximately 2.9 cm, consistent with pancreatic adenocarcinoma. 2. Gross intra and extrahepatic biliary ductal dilatation, common bile duct measuring 1.4 cm. Gallbladder hydrops. 3. The pancreatic duct is nondilated. 4. Pancreatic mass lies closely adjacent to the most central portions of the superior mesenteric vein and central portal vein, due to motion artifact it is difficult to clearly discern whether there is a preserved fat plane. Multiphasic contrast enhanced pancreatic protocol CT  may be less motion sensitive and better delineate vascular structures for the purposes of surgical planning if necessary. 5. No evidence of lymphadenopathy or metastatic disease in the abdomen.   10/10/2019 Procedure   EUS by Dr Paulita Fujita 10/10/19  IMPRESSION - A mass was identified at junction of head/uncinate process of the pancreas. This was staged T2 N1 Mx by endosonographic criteria. Fine needle aspiration performed. - A few abnormal lymph nodes were visualized in the peripancreatic region. - There was dilation in the common bile duct which measured up to 14 mm. - Hyperechoic material consistent with sludge was visualized endosonographically in the gallbladder.   10/10/2019 Procedure   ERCP by Dr Paulita Fujita 10/10/19  IMPRESSION - The major papilla appeared normal. - One temporary stent was placed into the ventral pancreatic duct. - A biliary sphincterotomy was performed. - One covered metal stent was placed into the common bile duct.   10/10/2019 Initial Biopsy   FINAL MICROSCOPIC DIAGNOSIS: 10/10/19  A. PANCREAS, HEAD, FINE NEEDLE ASPIRATION:  - Malignant cells present, consistent with adenocarcinoma    10/16/2019 Initial Diagnosis   Pancreatic cancer ([place])   10/16/2019 Cancer Staging   Staging form: Exocrine Pancreas, AJCC 8th Edition - Clinical stage from 10/16/2019: Stage IIB (cT2, cN1, cM0) - Signed by Truitt Merle, MD on 10/16/2019   10/23/2019 Imaging   CT CAP W contrast  IMPRESSION: 1. Pancreatic mass with local adenopathy and potential retroperitoneal adenopathy. Given retroperitoneal lymph nodes and presence of venous collaterals PET scan may be useful for staging purposes. 2. No definite vascular involvement or signs of upper abdominal venous collaterals. Some stranding around the celiac may be due to recent biopsy. 3. Borderline enlarged RIGHT external iliac lymph node. Attention on follow-up. 4. No evidence of metastatic disease in the chest.  5. Signs of recent ERCP and  stent placement.   10/25/2019 Procedure   PAC placed    11/09/2019 -  Chemotherapy   Neoadjuvant FOLFIRINOX q2weeks starting 11/09/19 for 8 cycles   11/28/2019 Genetic Testing   Negative genetic testing on the common hereditary cancer panel, pancreatic cancer panel, preliminary evidence pancreatic cancer panel and chronic pancreatitis panel.  The Common Hereditary Gene Panel offered by [facility] includes sequencing and/or deletion duplication testing of the following 55 genes: APC*, ATM*, AXIN2, BARD1, BMPR1A, BRCA1, BRCA2, BRIP1, CASR, CDH1, CDK4, CDKN2A (p14ARF), CDKN2A (p16INK4a), CFTR*, CHEK2, CPA1, CTNNA1, CTRC, DICER1*, EPCAM*, FANCC, GREM1*, HOXB13, KIT, MEN1*, MLH1*, MSH2*, MSH3*, MSH6*, MUTYH, NBN, NF1*, NTHL1, PALB2, PALLD, PDGFRA, PMS2*, POLD1*, POLE, PRSS1*, PTEN*, RAD50, RAD51C, RAD51D, SDHA*, SDHB, SDHC*, SDHD, SMAD4, SMARCA4, SPINK1, STK11, TP53, TSC1*, TSC2, and VHL .  The report date is November 28, 2019.   01/28/2020 Imaging   CT AP w contrast  IMPRESSION: 1. No substantial interval change in exam. 2. Similar appearance of the pancreatic mass with associated main pancreatic ductal dilatation. 3. Soft tissue stranding again noted around the celiac axis, in the para-aortic retroperitoneal space and along the IMA (new in the interval). These findings are associated with similar appearance of upper normal to borderline hepatoduodenal ligament and retroperitoneal lymphadenopathy. Close continued attention on follow-up recommended. 4. Borderline enlarged right external iliac node identified on previous study has decreased in size in the interval.   02/14/2020 PET scan   IMPRESSION: 1. The pancreatic mass has a maximum SUV of 6.1. No findings of hypermetabolic local adenopathy or distant metastatic spread. 2. Splenomegaly. 3. Inspissated barium or appendicolith within the appendix. No signs of appendiceal inflammation. 4. Scattered sigmoid colon diverticula. ADDENDUM: The original  report was by Dr. Van Clines. The following addendum is by Dr. Van Clines:   On the CT from 01/28/2020 there were some upper normal sized/borderline retroperitoneal lymph nodes including a 0.8 cm aortocaval node on image 72 of series 9. This currently has a maximum SUV of 2.2, which is similar to blood pool activity. An indistinctly marginated lymph node just below the left renal vein has a maximum SUV of 2.4, just above blood pool levels., and measures about 1.0 cm in diameter. Lymph nodes anterior to the left psoas muscle have maximum SUV of 2.0, similar to blood pool. Overall these lymph nodes are borderline enlarged and with activity level at or just minimally above blood pool. While certainly not grossly positive, their proximity to the pancreatic head mass and borderline imaging characteristics make it difficult to exclude early nodal involvement by malignancy.     CURRENT THERAPY: Neoadjuvant FOLFIRINOX every 2 weeks x8 cycles starting 11/09/2019  INTERVAL HISTORY: Ms. Owens Shark returns for follow-up and treatment as scheduled.  She completed cycle 7 neoadjuvant FOLFIRINOX on 02/03/2020 with Udenyca.  Her cold sensitivity lasted a little longer, over 1 week.  No residual neuropathy.  She had a sore mouth but no obvious sores, did not limit p.o. intake.  Appetite is normal.  Energy is much better 2-3 days after chemo. She has mild nausea she attributes to anticipation or discussing chemo in general.  No vomiting.  She has more frequent bowel movements up to 3/day, not loose or watery.  Denies bleeding.  She has general aches and pains, no significant abdominal or back pain.  Denies recent fever, chills, cough, chest pain, dyspnea, leg edema, or new concerns.    MEDICAL HISTORY:  Past Medical History:  Diagnosis Date  .  Cataracts, bilateral   . Diverticulosis 12/07/2001   found on colonoscopy  . Family history of colon cancer   . Family history of pancreatic cancer   .  Hypothyroid 10/2007  . Macular hole   . Pancreatic mass   . PONV (postoperative nausea and vomiting)     SURGICAL HISTORY: Past Surgical History:  Procedure Laterality Date  . BILIARY STENT PLACEMENT  10/10/2019   Procedure: BILIARY STENT PLACEMENT;  Surgeon: Arta Silence, MD;  Location: [place];  Service: Endoscopy;;  . CATARACT EXTRACTION, BILATERAL    . COLONOSCOPY    . ERCP N/A 10/10/2019   Procedure: ENDOSCOPIC RETROGRADE CHOLANGIOPANCREATOGRAPHY (ERCP);  Surgeon: Arta Silence, MD;  Location: [facility] ENDOSCOPY;  Service: Endoscopy;  Laterality: N/A;  . ESOPHAGOGASTRODUODENOSCOPY N/A 10/10/2019   Procedure: ESOPHAGOGASTRODUODENOSCOPY (EGD);  Surgeon: Arta Silence, MD;  Location: [facility] ENDOSCOPY;  Service: Endoscopy;  Laterality: N/A;  . EUS N/A 10/10/2019   Procedure: UPPER ENDOSCOPIC ULTRASOUND (EUS) LINEAR;  Surgeon: Arta Silence, MD;  Location: [place];  Service: Endoscopy;  Laterality: N/A;  . FINE NEEDLE ASPIRATION  10/10/2019   Procedure: FINE NEEDLE ASPIRATION (FNA) LINEAR;  Surgeon: Arta Silence, MD;  Location: [place];  Service: Endoscopy;;  . IR IMAGING GUIDED PORT INSERTION  10/25/2019  . Macular hole surg    . PANCREATIC STENT PLACEMENT  10/10/2019   Procedure: PANCREATIC STENT PLACEMENT;  Surgeon: Arta Silence, MD;  Location: [facility] ENDOSCOPY;  Service: Endoscopy;;  . SPHINCTEROTOMY  10/10/2019   Procedure: SPHINCTEROTOMY;  Surgeon: Arta Silence, MD;  Location: [facility] ENDOSCOPY;  Service: Endoscopy;;  . TONSILLECTOMY      I have reviewed the social history and family history with the patient and they are unchanged from previous note.  ALLERGIES:  has No Known Allergies.  MEDICATIONS:  Current Outpatient Medications  Medication Sig Dispense Refill  . Ascorbic Acid (VITAMIN C PO) Take 1,000 mg by mouth daily.     . B Complex Vitamins (B-COMPLEX/B-12 PO) Take by mouth.    . Calcium Carb-Cholecalciferol (CALCIUM 1000 + D PO) Take by mouth.    .  diphenhydrAMINE (BENADRYL) 25 MG tablet Take 25 mg by mouth at bedtime.    . ferrous sulfate 325 (65 FE) MG tablet Take 325 mg by mouth daily with breakfast.    . levothyroxine (SYNTHROID, LEVOTHROID) 50 MCG tablet Take 1 tablet (50 mcg total) by mouth daily. 90 tablet 4  . lidocaine-prilocaine (EMLA) cream Apply to affected area once 30 g 3  . LORazepam (ATIVAN) 0.5 MG tablet Take 1 tablet (0.5 mg total) by mouth daily as needed for anxiety. For anticipatory n/v prior to chemo treatments 15 tablet 0  . Multiple Vitamin (MULTIVITAMIN PO) Take by mouth.      . mupirocin ointment (BACTROBAN) 2 % Place 1 application into the nose daily as needed (sore in nose).    . ondansetron (ZOFRAN) 4 MG tablet Take 4 mg by mouth every 8 (eight) hours as needed.    . ondansetron (ZOFRAN) 8 MG tablet Take 1 tablet (8 mg total) by mouth 2 (two) times daily as needed. Start on day 3 after chemotherapy. 30 tablet 1  . prochlorperazine (COMPAZINE) 10 MG tablet TAKE 1 TABLET BY MOUTH EVERY 6 HOURS AS NEEDED FOR NAUSEA AND VOMITING 30 tablet 0   No current facility-administered medications for this visit.   Facility-Administered Medications Ordered in Other Visits  Medication Dose Route Frequency Provider Last Rate Last Admin  . fluorouracil (ADRUCIL) 3,950 mg in sodium chloride 0.9 %  71 mL chemo infusion  2,400 mg/m2 (Treatment Plan Recorded) Intravenous 1 day or 1 dose Truitt Merle, MD      . irinotecan (CAMPTOSAR) 240 mg in sodium chloride 0.9 % 500 mL chemo infusion  150 mg/m2 (Treatment Plan Recorded) Intravenous Once Truitt Merle, MD 341 mL/hr at 02/18/20 1341 240 mg at 02/18/20 1341  . leucovorin 660 mg in sodium chloride 0.9 % 250 mL infusion  400 mg/m2 (Treatment Plan Recorded) Intravenous Once Truitt Merle, MD 189 mL/hr at 02/18/20 1344 660 mg at 02/18/20 1344  . sodium chloride flush (NS) 0.9 % injection 10 mL  10 mL Intracatheter PRN Truitt Merle, MD        PHYSICAL EXAMINATION: ECOG PERFORMANCE STATUS: 1 -  Symptomatic but completely ambulatory  Vitals:   02/18/20 0842  BP: 127/69  Pulse: 74  Resp: 20  Temp: 97.8 F (36.6 C)  SpO2: 100%   Filed Weights   02/18/20 0842  Weight: 129 lb 9.6 oz (58.8 kg)    GENERAL:alert, no distress and comfortable SKIN: No rash to exposed skin EYES: sclera clear NECK: Without mass LUNGS: clear with normal breathing effort HEART: regular rate & rhythm, no lower extremity edema ABDOMEN:abdomen soft, non-tender and normal bowel sounds.  No hepatomegaly NEURO: alert & oriented x 3 with fluent speech, no focal motor/sensory deficits PAC without erythema  LABORATORY DATA:  I have reviewed the data as listed CBC Latest Ref Rng & Units 02/18/2020 02/03/2020 01/20/2020  WBC 4.0 - 10.5 K/uL 7.5 13.5(H) 10.4  Hemoglobin 12.0 - 15.0 g/dL 11.5(L) 11.6(L) 12.1  Hematocrit 36 - 46 % 35.9(L) 36.2 37.9  Platelets 150 - 400 K/uL 88(L) 99(L) 111(L)     CMP Latest Ref Rng & Units 02/18/2020 02/03/2020 01/20/2020  Glucose 70 - 99 mg/dL 99 102(H) 89  BUN 6 - 20 mg/dL 10 12 10   Creatinine 0.44 - 1.00 mg/dL 0.61 0.63 0.62  Sodium 135 - 145 mmol/L 137 135 137  Potassium 3.5 - 5.1 mmol/L 3.9 4.1 4.0  Chloride 98 - 111 mmol/L 104 102 103  CO2 22 - 32 mmol/L 29 28 27   Calcium 8.9 - 10.3 mg/dL 9.5 9.5 9.6  Total Protein 6.5 - 8.1 g/dL 7.8 7.8 8.0  Total Bilirubin 0.3 - 1.2 mg/dL 0.3 0.3 0.3  Alkaline Phos 38 - 126 U/L 198(H) 205(H) 223(H)  AST 15 - 41 U/L 42(H) 41 39  ALT 0 - 44 U/L 32 33 31      RADIOGRAPHIC STUDIES: I have personally reviewed the radiological images as listed and agreed with the findings in the report. No results found.   ASSESSMENT & PLAN: Breanna Noble a 59 y.o.caucasianfemalewith a history of Cataracts, Diverticulosis, Hypothyroidism  1. Pancreatic Cancer, Stage IIB, T2N1M0 -Her US showed bile duct and gallbladder dilation due to 2-3cm mass at head of pancreas/Uncinate. Her MRI showed her mass is laying close to SMV but not involving  it. There is no evidence of lymphadenopathy or metastatic disease in the abdomenon MRI. -She underwent EUS and ERCP on 10/10/19 and had 2 stents placed by Dr Paulita Fujita. Herpancreatic mass biopsyshowed Adenocarcinoma of Pancreas.Endoscopic staging T2N1, noevidence of vascular involvement. -She was seen by Dr. Barry Dienes who felt she is resectable after a course of neoadjuvant chemo -She underwent CT CAP with pancreatic protocol showing the pancreatic mass with local adenopathy and borderline potential adenopathy in the periaortic region which will be followed on restaging scans. There is no definite vascular involvementor distant metastasis.We reviewed this with  the patient and her husband today -Her case was reviewed in GI tumor conference on 10/30/19 -Again theplanis for neoadjuvant chemo followed by restaging scans and Whipple surgery -Given her young age and good performance status Dr. Burr Medico recommends intensive neoadjuvant chemo withFOLFIRINOXq2 weeksfor 8 cycles -began FOLFIRINOX with GCSF on 11/09/19, oxali dose-reduced on C7 due to thrombocytopenia  -CT AP from 01/28/20 which showed stable primary tumor and indeterminated soft tissue around celiac axis.  Retroperitoneal and distant LNs are smaller. There was no hypermetabolic retroperitoneal adenopathy on follow-up PET scan on 02/14/20 -She was seen by Dr. Barry Dienes on 10/18, the plan is to proceed with surgery after final cycle 8 FOLFIRINOX.  2. Obstructive Jaundice  -She had 3 weeks of Jaundice of eyes, skin and urine. She underwent ERCP with Dr Paulita Fujita for CBD and temporaryventral pancreatic ductstents placement on 10/10/19.  -Her case was reviewed in tumor board, stent is in good position -LFTs and hyperbilirubinemia continue to improve, clinically, her juandice resolved -We will monitor on chemo  3. Upper Abdominal Pain, Mild weight loss  -Presented with initial jaundice but since stent placement her pain has much improved. -Pain  resolved -Weight is stable, she met with our dietitian. Continue supplements  4. Genetic Testing -She has maternal family history of pancreatic and colon cancer. I discussed she is eligible of genetic testing. She is interested -negative result 11/28/19  Disposition: Ms. Norlander appears stable. She completed 7 cycles of neoadjuvant FOLFIRINOX.  She tolerates treatment well with mild fatigue, cold sensitivity, and n/d.  Side effects are well managed with supportive care at home.  She is able to recover and function well.  We personally reviewed and discussed the PET scan from 10/15 which does not show hypermetabolic retroperitoneal adenopathy or distant metastasis.  I also reviewed with radiology. She was seen by Dr. Barry Dienes on 10/18, the plan is to proceed with surgical resection in November.  We reviewed the CBC and CMP from today.  Platelet 88K, she denies bleeding.  Adequate to proceed with final cycle 8 neoadjuvant FOLFIRINOX today, oxaliplatin will remain dose reduced to 70 mg/m2.  She will return for you Udenyca and flu vaccine on 10/21.  The plan is to see her back after surgery to review final pathology.  She knows to contact the office sooner if she does not recover well from chemo or has any unmanaged side effects or new concerns in the interim.  All questions were answered. The patient knows to call the clinic with any problems, questions or concerns. No barriers to learning were detected.  Total encounter time was 30 minutes.     Alla Feeling, NP 02/18/20

## 2020-02-18 ENCOUNTER — Inpatient Hospital Stay: Payer: BC Managed Care – PPO

## 2020-02-18 ENCOUNTER — Encounter: Payer: Self-pay | Admitting: Nurse Practitioner

## 2020-02-18 ENCOUNTER — Inpatient Hospital Stay: Payer: BC Managed Care – PPO | Admitting: Nurse Practitioner

## 2020-02-18 ENCOUNTER — Other Ambulatory Visit: Payer: Self-pay

## 2020-02-18 VITALS — BP 127/69 | HR 74 | Temp 97.8°F | Resp 20 | Ht 65.0 in | Wt 129.6 lb

## 2020-02-18 DIAGNOSIS — C250 Malignant neoplasm of head of pancreas: Secondary | ICD-10-CM

## 2020-02-18 DIAGNOSIS — D6959 Other secondary thrombocytopenia: Secondary | ICD-10-CM | POA: Diagnosis not present

## 2020-02-18 DIAGNOSIS — K831 Obstruction of bile duct: Secondary | ICD-10-CM | POA: Diagnosis not present

## 2020-02-18 DIAGNOSIS — Z5189 Encounter for other specified aftercare: Secondary | ICD-10-CM | POA: Diagnosis not present

## 2020-02-18 DIAGNOSIS — Z5111 Encounter for antineoplastic chemotherapy: Secondary | ICD-10-CM | POA: Diagnosis not present

## 2020-02-18 DIAGNOSIS — R1010 Upper abdominal pain, unspecified: Secondary | ICD-10-CM | POA: Diagnosis not present

## 2020-02-18 DIAGNOSIS — C189 Malignant neoplasm of colon, unspecified: Secondary | ICD-10-CM | POA: Diagnosis not present

## 2020-02-18 DIAGNOSIS — Z95828 Presence of other vascular implants and grafts: Secondary | ICD-10-CM

## 2020-02-18 DIAGNOSIS — Z452 Encounter for adjustment and management of vascular access device: Secondary | ICD-10-CM | POA: Diagnosis not present

## 2020-02-18 DIAGNOSIS — C257 Malignant neoplasm of other parts of pancreas: Secondary | ICD-10-CM | POA: Diagnosis not present

## 2020-02-18 DIAGNOSIS — Z23 Encounter for immunization: Secondary | ICD-10-CM | POA: Diagnosis not present

## 2020-02-18 LAB — CBC WITH DIFFERENTIAL (CANCER CENTER ONLY)
Abs Immature Granulocytes: 0.13 10*3/uL — ABNORMAL HIGH (ref 0.00–0.07)
Basophils Absolute: 0 10*3/uL (ref 0.0–0.1)
Basophils Relative: 0 %
Eosinophils Absolute: 0.1 10*3/uL (ref 0.0–0.5)
Eosinophils Relative: 1 %
HCT: 35.9 % — ABNORMAL LOW (ref 36.0–46.0)
Hemoglobin: 11.5 g/dL — ABNORMAL LOW (ref 12.0–15.0)
Immature Granulocytes: 2 %
Lymphocytes Relative: 19 %
Lymphs Abs: 1.4 10*3/uL (ref 0.7–4.0)
MCH: 29 pg (ref 26.0–34.0)
MCHC: 32 g/dL (ref 30.0–36.0)
MCV: 90.7 fL (ref 80.0–100.0)
Monocytes Absolute: 1.7 10*3/uL — ABNORMAL HIGH (ref 0.1–1.0)
Monocytes Relative: 23 %
Neutro Abs: 4.1 10*3/uL (ref 1.7–7.7)
Neutrophils Relative %: 55 %
Platelet Count: 88 10*3/uL — ABNORMAL LOW (ref 150–400)
RBC: 3.96 MIL/uL (ref 3.87–5.11)
RDW: 18.7 % — ABNORMAL HIGH (ref 11.5–15.5)
WBC Count: 7.5 10*3/uL (ref 4.0–10.5)
nRBC: 0 % (ref 0.0–0.2)

## 2020-02-18 LAB — CMP (CANCER CENTER ONLY)
ALT: 32 U/L (ref 0–44)
AST: 42 U/L — ABNORMAL HIGH (ref 15–41)
Albumin: 3.4 g/dL — ABNORMAL LOW (ref 3.5–5.0)
Alkaline Phosphatase: 198 U/L — ABNORMAL HIGH (ref 38–126)
Anion gap: 4 — ABNORMAL LOW (ref 5–15)
BUN: 10 mg/dL (ref 6–20)
CO2: 29 mmol/L (ref 22–32)
Calcium: 9.5 mg/dL (ref 8.9–10.3)
Chloride: 104 mmol/L (ref 98–111)
Creatinine: 0.61 mg/dL (ref 0.44–1.00)
GFR, Estimated: >60 mL/min (ref >60)
Glucose, Bld: 99 mg/dL (ref 70–99)
Potassium: 3.9 mmol/L (ref 3.5–5.1)
Sodium: 137 mmol/L (ref 135–145)
Total Bilirubin: 0.3 mg/dL (ref 0.3–1.2)
Total Protein: 7.8 g/dL (ref 6.5–8.1)

## 2020-02-18 MED ORDER — SODIUM CHLORIDE 0.9 % IV SOLN
150.0000 mg | Freq: Once | INTRAVENOUS | Status: AC
  Administered 2020-02-18: 150 mg via INTRAVENOUS
  Filled 2020-02-18: qty 150

## 2020-02-18 MED ORDER — DEXTROSE 5 % IV SOLN
Freq: Once | INTRAVENOUS | Status: AC
  Filled 2020-02-18: qty 250

## 2020-02-18 MED ORDER — SODIUM CHLORIDE 0.9 % IV SOLN
10.0000 mg | Freq: Once | INTRAVENOUS | Status: AC
  Administered 2020-02-18: 10 mg via INTRAVENOUS
  Filled 2020-02-18: qty 10

## 2020-02-18 MED ORDER — PALONOSETRON HCL INJECTION 0.25 MG/5ML
INTRAVENOUS | Status: AC
  Filled 2020-02-18: qty 5

## 2020-02-18 MED ORDER — ATROPINE SULFATE 1 MG/ML IJ SOLN
INTRAMUSCULAR | Status: AC
  Filled 2020-02-18: qty 1

## 2020-02-18 MED ORDER — ATROPINE SULFATE 1 MG/ML IJ SOLN
0.5000 mg | Freq: Once | INTRAMUSCULAR | Status: AC | PRN
  Administered 2020-02-18: 0.5 mg via INTRAVENOUS

## 2020-02-18 MED ORDER — PALONOSETRON HCL INJECTION 0.25 MG/5ML
0.2500 mg | Freq: Once | INTRAVENOUS | Status: AC
  Administered 2020-02-18: 0.25 mg via INTRAVENOUS

## 2020-02-18 MED ORDER — SODIUM CHLORIDE 0.9 % IV SOLN
400.0000 mg/m2 | Freq: Once | INTRAVENOUS | Status: AC
  Administered 2020-02-18: 660 mg via INTRAVENOUS
  Filled 2020-02-18: qty 33

## 2020-02-18 MED ORDER — SODIUM CHLORIDE 0.9% FLUSH
10.0000 mL | Freq: Once | INTRAVENOUS | Status: AC
  Administered 2020-02-18: 10 mL
  Filled 2020-02-18: qty 10

## 2020-02-18 MED ORDER — SODIUM CHLORIDE 0.9 % IV SOLN
150.0000 mg/m2 | Freq: Once | INTRAVENOUS | Status: AC
  Administered 2020-02-18: 240 mg via INTRAVENOUS
  Filled 2020-02-18: qty 12

## 2020-02-18 MED ORDER — OXALIPLATIN CHEMO INJECTION 100 MG/20ML
70.0000 mg/m2 | Freq: Once | INTRAVENOUS | Status: AC
  Administered 2020-02-18: 115 mg via INTRAVENOUS
  Filled 2020-02-18: qty 20

## 2020-02-18 MED ORDER — SODIUM CHLORIDE 0.9% FLUSH
10.0000 mL | INTRAVENOUS | Status: DC | PRN
  Administered 2020-02-18: 10 mL
  Filled 2020-02-18: qty 10

## 2020-02-18 MED ORDER — SODIUM CHLORIDE 0.9 % IV SOLN
2400.0000 mg/m2 | INTRAVENOUS | Status: DC
  Administered 2020-02-18: 3950 mg via INTRAVENOUS
  Filled 2020-02-18: qty 79

## 2020-02-18 NOTE — Patient Instructions (Signed)
[facility] Discharge Instructions for Patients Receiving Chemotherapy  Today you received the following chemotherapy agents: Oxaliplatin, Irinotecan. Leucovorin, 5FU  To help prevent nausea and vomiting after your treatment, we encourage you to take your nausea medication as directed.    If you develop nausea and vomiting that is not controlled by your nausea medication, call the clinic.   BELOW ARE SYMPTOMS THAT SHOULD BE REPORTED IMMEDIATELY:  *FEVER GREATER THAN 100.5 F  *CHILLS WITH OR WITHOUT FEVER  NAUSEA AND VOMITING THAT IS NOT CONTROLLED WITH YOUR NAUSEA MEDICATION  *UNUSUAL SHORTNESS OF BREATH  *UNUSUAL BRUISING OR BLEEDING  TENDERNESS IN MOUTH AND THROAT WITH OR WITHOUT PRESENCE OF ULCERS  *URINARY PROBLEMS  *BOWEL PROBLEMS  UNUSUAL RASH Items with * indicate a potential emergency and should be followed up as soon as possible.  Feel free to call the clinic should you have any questions or concerns. The clinic phone number is [phone].  Please show the [place] at check-in to the Emergency Department and triage nurse.

## 2020-02-18 NOTE — Progress Notes (Signed)
Platelets today are 63 - ok to treat per Lanora Manis NP

## 2020-02-18 NOTE — Patient Instructions (Signed)

## 2020-02-19 LAB — CANCER ANTIGEN 19-9: CA 19-9: 122 U/mL — ABNORMAL HIGH (ref 0–35)

## 2020-02-20 ENCOUNTER — Inpatient Hospital Stay: Payer: BC Managed Care – PPO

## 2020-02-20 ENCOUNTER — Other Ambulatory Visit: Payer: Self-pay

## 2020-02-20 VITALS — BP 107/67 | HR 84 | Temp 99.1°F | Resp 18

## 2020-02-20 DIAGNOSIS — D6959 Other secondary thrombocytopenia: Secondary | ICD-10-CM | POA: Diagnosis not present

## 2020-02-20 DIAGNOSIS — Z452 Encounter for adjustment and management of vascular access device: Secondary | ICD-10-CM | POA: Diagnosis not present

## 2020-02-20 DIAGNOSIS — C257 Malignant neoplasm of other parts of pancreas: Secondary | ICD-10-CM | POA: Diagnosis not present

## 2020-02-20 DIAGNOSIS — Z5111 Encounter for antineoplastic chemotherapy: Secondary | ICD-10-CM | POA: Diagnosis not present

## 2020-02-20 DIAGNOSIS — Z5189 Encounter for other specified aftercare: Secondary | ICD-10-CM | POA: Diagnosis not present

## 2020-02-20 DIAGNOSIS — K831 Obstruction of bile duct: Secondary | ICD-10-CM | POA: Diagnosis not present

## 2020-02-20 DIAGNOSIS — R1010 Upper abdominal pain, unspecified: Secondary | ICD-10-CM | POA: Diagnosis not present

## 2020-02-20 DIAGNOSIS — Z23 Encounter for immunization: Secondary | ICD-10-CM | POA: Diagnosis not present

## 2020-02-20 DIAGNOSIS — C250 Malignant neoplasm of head of pancreas: Secondary | ICD-10-CM

## 2020-02-20 MED ORDER — HEPARIN SOD (PORK) LOCK FLUSH 100 UNIT/ML IV SOLN
500.0000 [IU] | Freq: Once | INTRAVENOUS | Status: AC | PRN
  Administered 2020-02-20: 500 [IU]
  Filled 2020-02-20: qty 5

## 2020-02-20 MED ORDER — INFLUENZA VAC SPLIT QUAD 0.5 ML IM SUSY
0.5000 mL | PREFILLED_SYRINGE | Freq: Once | INTRAMUSCULAR | Status: AC
  Administered 2020-02-20: 0.5 mL via INTRAMUSCULAR

## 2020-02-20 MED ORDER — SODIUM CHLORIDE 0.9% FLUSH
10.0000 mL | INTRAVENOUS | Status: DC | PRN
  Administered 2020-02-20: 10 mL
  Filled 2020-02-20: qty 10

## 2020-02-20 MED ORDER — INFLUENZA VAC SPLIT QUAD 0.5 ML IM SUSY
PREFILLED_SYRINGE | INTRAMUSCULAR | Status: AC
  Filled 2020-02-20: qty 0.5

## 2020-02-20 MED ORDER — PEGFILGRASTIM-CBQV 6 MG/0.6ML ~~LOC~~ SOSY
6.0000 mg | PREFILLED_SYRINGE | Freq: Once | SUBCUTANEOUS | Status: AC
  Administered 2020-02-20: 6 mg via SUBCUTANEOUS

## 2020-02-20 MED ORDER — PEGFILGRASTIM-CBQV 6 MG/0.6ML ~~LOC~~ SOSY
PREFILLED_SYRINGE | SUBCUTANEOUS | Status: AC
  Filled 2020-02-20: qty 0.6

## 2020-02-26 ENCOUNTER — Telehealth: Payer: Self-pay | Admitting: Hematology

## 2020-02-26 NOTE — Telephone Encounter (Signed)
Scheduled per 10/26 sch message. Pt is aware of appt times and date.

## 2020-03-10 ENCOUNTER — Telehealth: Payer: Self-pay

## 2020-03-10 NOTE — Telephone Encounter (Signed)
Breanna Noble called stating that she has a stuffy nose and sore throat.  I recommended she be evaluated by her PCP and be tested for covid.  She verbalized understanding.

## 2020-03-11 DIAGNOSIS — L299 Pruritus, unspecified: Secondary | ICD-10-CM | POA: Diagnosis not present

## 2020-03-11 DIAGNOSIS — J069 Acute upper respiratory infection, unspecified: Secondary | ICD-10-CM | POA: Diagnosis not present

## 2020-03-20 ENCOUNTER — Encounter (HOSPITAL_COMMUNITY): Payer: Self-pay

## 2020-03-20 ENCOUNTER — Other Ambulatory Visit (HOSPITAL_COMMUNITY)
Admission: RE | Admit: 2020-03-20 | Discharge: 2020-03-20 | Disposition: A | Discharge status: Home / Self Care | Payer: BC Managed Care – PPO | Source: Ambulatory Visit | Attending: General Surgery | Admitting: General Surgery

## 2020-03-20 ENCOUNTER — Encounter (HOSPITAL_COMMUNITY)
Admission: RE | Admit: 2020-03-20 | Discharge: 2020-03-20 | Disposition: A | Discharge status: Home / Self Care | Payer: BC Managed Care – PPO | Source: Ambulatory Visit | Attending: General Surgery | Admitting: General Surgery

## 2020-03-20 ENCOUNTER — Other Ambulatory Visit: Payer: Self-pay

## 2020-03-20 DIAGNOSIS — Z20822 Contact with and (suspected) exposure to covid-19: Secondary | ICD-10-CM | POA: Diagnosis not present

## 2020-03-20 DIAGNOSIS — Z01812 Encounter for preprocedural laboratory examination: Secondary | ICD-10-CM | POA: Insufficient documentation

## 2020-03-20 HISTORY — DX: Malignant (primary) neoplasm, unspecified: C80.1

## 2020-03-20 LAB — CBC WITH DIFFERENTIAL/PLATELET
Abs Immature Granulocytes: 0.01 10*3/uL (ref 0.00–0.07)
Basophils Absolute: 0 10*3/uL (ref 0.0–0.1)
Basophils Relative: 1 %
Eosinophils Absolute: 0 10*3/uL (ref 0.0–0.5)
Eosinophils Relative: 1 %
HCT: 37.9 % (ref 36.0–46.0)
Hemoglobin: 11.5 g/dL — ABNORMAL LOW (ref 12.0–15.0)
Immature Granulocytes: 0 %
Lymphocytes Relative: 36 %
Lymphs Abs: 1.6 10*3/uL (ref 0.7–4.0)
MCH: 29.7 pg (ref 26.0–34.0)
MCHC: 30.3 g/dL (ref 30.0–36.0)
MCV: 97.9 fL (ref 80.0–100.0)
Monocytes Absolute: 0.8 10*3/uL (ref 0.1–1.0)
Monocytes Relative: 19 %
Neutro Abs: 1.9 10*3/uL (ref 1.7–7.7)
Neutrophils Relative %: 43 %
Platelets: 106 10*3/uL — ABNORMAL LOW (ref 150–400)
RBC: 3.87 MIL/uL (ref 3.87–5.11)
RDW: 18.1 % — ABNORMAL HIGH (ref 11.5–15.5)
WBC: 4.3 10*3/uL (ref 4.0–10.5)
nRBC: 0 % (ref 0.0–0.2)

## 2020-03-20 LAB — SARS CORONAVIRUS 2 (TAT 6-24 HRS): SARS Coronavirus 2: NEGATIVE

## 2020-03-20 LAB — COMPREHENSIVE METABOLIC PANEL
ALT: 48 U/L — ABNORMAL HIGH (ref 0–44)
AST: 57 U/L — ABNORMAL HIGH (ref 15–41)
Albumin: 3.7 g/dL (ref 3.5–5.0)
Alkaline Phosphatase: 186 U/L — ABNORMAL HIGH (ref 38–126)
Anion gap: 7 (ref 5–15)
BUN: 10 mg/dL (ref 6–20)
CO2: 28 mmol/L (ref 22–32)
Calcium: 9.7 mg/dL (ref 8.9–10.3)
Chloride: 102 mmol/L (ref 98–111)
Creatinine, Ser: 0.53 mg/dL (ref 0.44–1.00)
GFR, Estimated: >60 mL/min (ref >60)
Glucose, Bld: 103 mg/dL — ABNORMAL HIGH (ref 70–99)
Potassium: 4.6 mmol/L (ref 3.5–5.1)
Sodium: 137 mmol/L (ref 135–145)
Total Bilirubin: 0.3 mg/dL (ref 0.3–1.2)
Total Protein: 7.9 g/dL (ref 6.5–8.1)

## 2020-03-20 LAB — PROTIME-INR
INR: 1.1 (ref 0.8–1.2)
Prothrombin Time: 13.7 seconds (ref 11.4–15.2)

## 2020-03-20 LAB — LIPASE, BLOOD: Lipase: 118 U/L — ABNORMAL HIGH (ref 11–51)

## 2020-03-20 NOTE — Progress Notes (Signed)
Your procedure is scheduled on Tuesday, March 24, 2020.  Report to [facility] Main Entrance "A" at 5:30 A.M., and check in at the Admitting office.  Call this number if you have problems the morning of surgery:  [phone]  Call [phone] if you have any questions prior to your surgery date Monday-Friday 8am-4pm    Remember:  Do not eat after midnight the night before your surgery  You may drink clear liquids until 4:30 AM the morning of your surgery.   Clear liquids allowed are: Water, Non-Citrus Juices (without pulp), Carbonated Beverages, Clear Tea, Black Coffee Only, and Gatorade  Please complete your PRE-SURGERY ENSURE that was provided to you by 4:30 AM the morning of surgery.  Please, if able, drink it in one setting. DO NOT SIP. This should be the last thing that you drink before arriving for surgery.     Take these medicines the morning of surgery with A SIP OF WATER:  levothyroxine (SYNTHROID, LEVOTHROID) prochlorperazine (COMPAZINE) - if needed   As of today, STOP taking any Aspirin (unless otherwise instructed by your surgeon) Aleve, Naproxen, Ibuprofen, Motrin, Advil, Goody's, BC's, all herbal medications, fish oil, and all vitamins.                      Do not wear jewelry, make up, or nail polish            Do not wear lotions, powders, perfumes, or deodorant.            Do not shave 48 hours prior to surgery.            Do not bring valuables to the hospital.            [facility] is not responsible for any belongings or valuables.  Do NOT Smoke (Tobacco/Vaping) or drink Alcohol 24 hours prior to your procedure If you use a CPAP at night, you may bring all equipment for your overnight stay.   Contacts, glasses, dentures or bridgework may not be worn into surgery.      For patients admitted to the hospital, discharge time will be determined by your treatment team.   Patients discharged the day of surgery will not be allowed to drive home, and someone  needs to stay with them for 24 hours.    Special instructions:   [facility]- Preparing For Surgery  Before surgery, you can play an important role. Because skin is not sterile, your skin needs to be as free of germs as possible. You can reduce the number of germs on your skin by washing with CHG (chlorahexidine gluconate) Soap before surgery.  CHG is an antiseptic cleaner which kills germs and bonds with the skin to continue killing germs even after washing.    Oral Hygiene is also important to reduce your risk of infection.  Remember - BRUSH YOUR TEETH THE MORNING OF SURGERY WITH YOUR REGULAR TOOTHPASTE  Please do not use if you have an allergy to CHG or antibacterial soaps. If your skin becomes reddened/irritated stop using the CHG.  Do not shave (including legs and underarms) for at least 48 hours prior to first CHG shower. It is OK to shave your face.  Please follow these instructions carefully.   1. Shower the NIGHT BEFORE SURGERY and the MORNING OF SURGERY with CHG Soap.   2. If you chose to wash your hair, wash your hair first as usual with your normal shampoo.  3. After you shampoo,  rinse your hair and body thoroughly to remove the shampoo.  4. Use CHG as you would any other liquid soap. You can apply CHG directly to the skin and wash gently with a scrungie or a clean washcloth.   5. Apply the CHG Soap to your body ONLY FROM THE NECK DOWN.  Do not use on open wounds or open sores. Avoid contact with your eyes, ears, mouth and genitals (private parts). Wash Face and genitals (private parts)  with your normal soap.   6. Wash thoroughly, paying special attention to the area where your surgery will be performed.  7. Thoroughly rinse your body with warm water from the neck down.  8. DO NOT shower/wash with your normal soap after using and rinsing off the CHG Soap.  9. Pat yourself dry with a CLEAN TOWEL.  10. Wear CLEAN PAJAMAS to bed the night before surgery  11. Place CLEAN  SHEETS on your bed the night of your first shower and DO NOT SLEEP WITH PETS.   Day of Surgery: Wear Clean/Comfortable clothing the morning of surgery Do not apply any deodorants/lotions.   Remember to brush your teeth WITH YOUR REGULAR TOOTHPASTE.   Please read over the following fact sheets that you were given.

## 2020-03-20 NOTE — Progress Notes (Addendum)
PCP - Dr. Yaakov Guthrie Cardiologist - Dr. Truitt Merle  PPM/ICD - Denies  Chest x-ray - N/A EKG - N/A Stress Test - Denies ECHO - Denies  Cardiac Cath - Denies  Sleep Study - Denies  Patient denies having diabetes.  Blood Thinner Instructions: N/A Aspirin Instructions: N/A  ERAS Protcol - Yes PRE-SURGERY Ensure - Yes  COVID TEST- 03/20/20   Coronavirus Screening  Have you experienced the following symptoms:  Cough yes/no: No Fever (>100.9F)  yes/no: No Runny nose yes/no: No Sore throat yes/no: No Difficulty breathing/shortness of breath  yes/no: No  Have you or a family member traveled in the last 14 days and where? yes/no: No   If the patient indicates "YES" to the above questions, their PAT will be rescheduled to limit the exposure to others and, the surgeon will be notified. THE PATIENT WILL NEED TO BE ASYMPTOMATIC FOR 14 DAYS.   If the patient is not experiencing any of these symptoms, the PAT nurse will instruct them to NOT bring anyone with them to their appointment since they may have these symptoms or traveled as well.   Please remind your patients and families that hospital visitation restrictions are in effect and the importance of the restrictions.     Anesthesia review: Yes, patient just recently completed chemo treatment  Patient denies shortness of breath, fever, cough and chest pain at PAT appointment   All instructions explained to the patient, with a verbal understanding of the material. Patient agrees to go over the instructions while at home for a better understanding. Patient also instructed to self quarantine after being tested for COVID-19. The opportunity to ask questions was provided.

## 2020-03-23 NOTE — H&P (Signed)
Breanna Noble Location: [facility] Patient #: [id number] DOB: Sep 19, 1960 Married / Language: English / Race: White Female   History of Present Illness  The patient is a 59 year old female who presents for a follow-up for Pancreatic cancer. Pt is a 59 yo F referred by Dr. 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 She presented with jaundice, pruritus and slight weight loss of 5-10 pounds. She first had a RUQ u/s showing ductal dilitation and a pancreatic head mass. This was followed by an MR and GI referral. EUS and ERCP showed intact vein interface, though MR was suspicious for possible loss of fat plane. Cytology from FNA was positive for adenocarcinoma. Metal stent was placed during ERCP. Patient reports that she really hasn't felt bad for very long. She had a cold, then a week of nausea, then the jaundice/itching. She has noted her waistbands being a little loose, but not significantly. Her bili was 8.8 post stent, but I don't have it pre stent. It is down to 5.3 today.   Of note, her maternal grandmother had pancreatic cancer and her maternal uncle had colon cancer. She was referred for genetics and had negative testing.   She has been receiving neoadjuvant FOLFIRINOX with Dr. Burr Medico. Her post chemo restaging scans were concerning for some retroperitoneal adenopathy. PET is ordered and scheduled for 10/19. She has been tolerating chemo relatively well.   CA 19-9 883 right after stent. Now 122 02/18/2020   CT abd/pelvis 01/29/2020 IMPRESSION: 1. No substantial interval change in exam. 2. Similar appearance of the pancreatic mass with associated main pancreatic ductal dilatation. 3. Soft tissue stranding again noted around the celiac axis, in the para-aortic retroperitoneal space and along the IMA (new in the interval). These findings are associated with similar appearance of upper normal to borderline hepatoduodenal ligament  and retroperitoneal lymphadenopathy. Close continued attention on follow-up recommended. 4. Borderline enlarged right external iliac node identified on previous study has decreased in size in the interval.    cytology 10/10/2019 A. PANCREAS, HEAD, FINE NEEDLE ASPIRATION: - Malignant cells present, consistent with adenocarcinoma  EUS and ERCP 10/10/2019 mass was identified at junction of head/uncinate process of the pancreas. This was staged T2 N1 Mx by endosonographic criteria. Fine needle aspiration performed. - A few abnormal lymph nodes were visualized in the peripancreatic region. - There was dilation in the common bile duct which measured up to 14 mm. - Hyperechoic material consistent with sludge was visualized endosonographically in the gallbladder.  The major papilla appeared normal. - One temporary stent was placed into the ventral pancreatic duct. - A biliary sphincterotomy was performed. - One covered metal stent was placed into the common bile duct.  MR abd 10/07/2019  IMPRESSION: 1. There is a hypoenhancing mass of the pancreatic uncinate with abrupt obstruction of the central common bile duct, measuring approximately 2.9 cm, consistent with pancreatic adenocarcinoma. 2. Gross intra and extrahepatic biliary ductal dilatation, common bile duct measuring 1.4 cm. Gallbladder hydrops. 3. The pancreatic duct is nondilated. 4. Pancreatic mass lies closely adjacent to the most central portions of the superior mesenteric vein and central portal vein, due to motion artifact it is difficult to clearly discern whether there is a preserved fat plane. Multiphasic contrast enhanced pancreatic protocol CT may be less motion sensitive and better delineate vascular structures for the purposes of surgical planning if necessary. 5. No evidence of lymphadenopathy or metastatic disease in the abdomen.  RUQ u/s  10/03/2019 IMPRESSION: Markedly dilated bile ducts. Dilated gallbladder with  sludge but no gallstones.  2.9 cm mass in the uncinate process most likely pancreatic neoplasm causing biliary obstruction. Recommend MRI of the pancreas without with contrast for further evaluation. If the patient cannot have MRI, CT of the pancreas with contrast recommended   Allergies  No Known Drug Allergies   Allergies Reconciled   Medication History  Levothyroxine Sodium (50MCG Tablet, Oral) Active. Ondansetron HCl (8MG  Tablet, Oral) Active. Prochlorperazine Maleate (10MG  Tablet, Oral) Active. Medications Reconciled    Review of Systems All other systems negative  Vitals Weight: 127.25 lb Height: 65in Body Surface Area: 1.63 m Body Mass Index: 21.18 kg/m  Temp.: 94.87F  Pulse: 109 (Regular)  BP: 120/78(Sitting, Left Arm, Standard)       Physical Exam General Mental Status-Alert. General Appearance-Consistent with stated age. Hydration-Well hydrated. Voice-Normal.  Head and Neck Head-normocephalic, atraumatic with no lesions or palpable masses.  Eye Sclera/Conjunctiva - Bilateral-No scleral icterus.  Chest and Lung Exam Chest and lung exam reveals -quiet, even and easy respiratory effort with no use of accessory muscles. Inspection Chest Wall - Normal. Back - normal.  Breast - Did not examine.  Cardiovascular Cardiovascular examination reveals -normal pedal pulses bilaterally. Note: regular rate and rhythm  Abdomen Inspection-Inspection Normal. Palpation/Percussion Palpation and Percussion of the abdomen reveal - Soft, Non Tender, No Rebound tenderness, No Rigidity (guarding) and No hepatosplenomegaly.  Peripheral Vascular Upper Extremity Inspection - Bilateral - Normal - No Clubbing, No Cyanosis, No Edema, Pulses Intact. Lower Extremity Palpation - Edema - Bilateral - No edema - Bilateral.  Neurologic Neurologic evaluation reveals -alert and oriented x 3 with no impairment of recent or remote  memory. Mental Status-Normal.  Musculoskeletal Global Assessment -Note: no gross deformities.  Normal Exam - Left-Upper Extremity Strength Normal and Lower Extremity Strength Normal. Normal Exam - Right-Upper Extremity Strength Normal and Lower Extremity Strength Normal.  Lymphatic Head & Neck  General Head & Neck Lymphatics: Bilateral - Description - Normal. Axillary  General Axillary Region: Bilateral - Description - Normal. Tenderness - Non Tender.    Assessment & Plan  ADENOCARCINOMA OF HEAD OF PANCREAS (C25.0) Impression: If PET is negative for hypermetabolic activity in the retroperitoneal nodes, will plan for Whipple. I reviewed whipple again along with conditions which would mean we would abort.  I discussed the surgery with the patient including diagrams of anatomy. I discussed the potential for diagnostic laparoscopy. In the case of pancreatic cancer, if spread of the disease is found, we will abort the procedure and not proceed with resection. The rationale for this was discussed with the patient. There has not been data to support resection of Stage IV disease in terms of survival benefit.  We discussed possible complications including: Potential of aborting procedure if tumor is invading the superior mesenteric or hepatic arteries Bleeding Infection and possible wound complications such as hernia Damage to adjacent structures Leak of anastamoses, primarily pancreatic Possible need for other procedures Possible prolonged nausea with possible need for external feeding. Possible prolonged hospital stay. Possible development of diabetes or worsening of current diabetes. Possible pancreatic exocrine insufficiency Prolonged fatigue/weakness/appetite Possible early recurrence of cancer   The patient understands and wishes to proceed. The patient has been advised to turn in disability paperwork to our office. Current Plans You are being scheduled for surgery-  Our schedulers will call you.  You should hear from our office's scheduling department within 5 working days about the location, date, and time of  surgery. We try to make accommodations for patient's preferences in scheduling surgery, but sometimes the OR schedule or the surgeon's schedule prevents [place] from making those accommodations.  If you have not heard from our office [phone]) in 5 working days, call the office and ask for your surgeon's nurse.  If you have other questions about your diagnosis, plan, or surgery, call the office and ask for your surgeon's nurse.  Pt Education - flb whipple pt info

## 2020-03-23 NOTE — Anesthesia Preprocedure Evaluation (Addendum)
Anesthesia Evaluation  Patient identified by MRN, date of birth, ID band Patient awake    Reviewed: Allergy & Precautions, NPO status , Patient's Chart, lab work & pertinent test results  History of Anesthesia Complications (+) PONV  Airway Mallampati: I  TM Distance: >3 FB Neck ROM: Full    Dental no notable dental hx. (+) Teeth Intact, Dental Advisory Given   Pulmonary neg pulmonary ROS,    Pulmonary exam normal breath sounds clear to auscultation       Cardiovascular negative cardio ROS Normal cardiovascular exam Rhythm:Regular Rate:Normal     Neuro/Psych negative neurological ROS  negative psych ROS   GI/Hepatic negative GI ROS, Neg liver ROS,   Endo/Other  Hypothyroidism   Renal/GU negative Renal ROS  negative genitourinary   Musculoskeletal negative musculoskeletal ROS (+)   Abdominal   Peds  Hematology  (+) Blood dyscrasia (Hgb 11.5), ,   Anesthesia Other Findings Pancreatic CA  Reproductive/Obstetrics                            Anesthesia Physical Anesthesia Plan  ASA: II  Anesthesia Plan: General   Post-op Pain Management:    Induction: Intravenous  PONV Risk Score and Plan: 4 or greater and Midazolam, Dexamethasone, Ondansetron and Treatment may vary due to age or medical condition  Airway Management Planned: Oral ETT  Additional Equipment: Arterial line  Intra-op Plan:   Post-operative Plan: Extubation in OR  Informed Consent: I have reviewed the patients History and Physical, chart, labs and discussed the procedure including the risks, benefits and alternatives for the proposed anesthesia with the patient or authorized representative who has indicated his/her understanding and acceptance.     Dental advisory given  Plan Discussed with: CRNA  Anesthesia Plan Comments: (Plan for thoracic epidural.)        Anesthesia Quick Evaluation

## 2020-03-24 ENCOUNTER — Other Ambulatory Visit: Payer: Self-pay

## 2020-03-24 ENCOUNTER — Encounter (HOSPITAL_COMMUNITY): Payer: Self-pay | Admitting: General Surgery

## 2020-03-24 ENCOUNTER — Inpatient Hospital Stay (HOSPITAL_COMMUNITY)
Admission: RE | Admit: 2020-03-24 | Discharge: 2020-03-30 | DRG: 406 | Disposition: A | Discharge status: Home Health | Payer: BC Managed Care – PPO | Attending: General Surgery | Admitting: General Surgery

## 2020-03-24 ENCOUNTER — Inpatient Hospital Stay (HOSPITAL_COMMUNITY): Payer: BC Managed Care – PPO | Admitting: Anesthesiology

## 2020-03-24 ENCOUNTER — Encounter (HOSPITAL_COMMUNITY)
Admission: RE | Disposition: A | Discharge status: Home Health | Payer: Self-pay | Source: Home / Self Care | Attending: General Surgery

## 2020-03-24 DIAGNOSIS — Z6821 Body mass index (BMI) 21.0-21.9, adult: Secondary | ICD-10-CM

## 2020-03-24 DIAGNOSIS — K811 Chronic cholecystitis: Secondary | ICD-10-CM | POA: Diagnosis not present

## 2020-03-24 DIAGNOSIS — C259 Malignant neoplasm of pancreas, unspecified: Secondary | ICD-10-CM | POA: Diagnosis not present

## 2020-03-24 DIAGNOSIS — Z79899 Other long term (current) drug therapy: Secondary | ICD-10-CM

## 2020-03-24 DIAGNOSIS — G8918 Other acute postprocedural pain: Secondary | ICD-10-CM | POA: Diagnosis not present

## 2020-03-24 DIAGNOSIS — Z79890 Hormone replacement therapy (postmenopausal): Secondary | ICD-10-CM | POA: Diagnosis not present

## 2020-03-24 DIAGNOSIS — Z20822 Contact with and (suspected) exposure to covid-19: Secondary | ICD-10-CM | POA: Diagnosis present

## 2020-03-24 DIAGNOSIS — E876 Hypokalemia: Secondary | ICD-10-CM | POA: Diagnosis not present

## 2020-03-24 DIAGNOSIS — C250 Malignant neoplasm of head of pancreas: Principal | ICD-10-CM | POA: Diagnosis present

## 2020-03-24 DIAGNOSIS — K8689 Other specified diseases of pancreas: Secondary | ICD-10-CM | POA: Diagnosis not present

## 2020-03-24 DIAGNOSIS — E440 Moderate protein-calorie malnutrition: Secondary | ICD-10-CM | POA: Insufficient documentation

## 2020-03-24 DIAGNOSIS — N179 Acute kidney failure, unspecified: Secondary | ICD-10-CM | POA: Diagnosis not present

## 2020-03-24 DIAGNOSIS — E039 Hypothyroidism, unspecified: Secondary | ICD-10-CM | POA: Diagnosis not present

## 2020-03-24 DIAGNOSIS — Z800 Family history of malignant neoplasm of digestive organs: Secondary | ICD-10-CM

## 2020-03-24 HISTORY — PX: APPENDECTOMY: SHX54

## 2020-03-24 HISTORY — PX: LAPAROSCOPY: SHX197

## 2020-03-24 HISTORY — PX: WHIPPLE PROCEDURE: SHX2667

## 2020-03-24 LAB — POCT I-STAT 7, (LYTES, BLD GAS, ICA,H+H)
Acid-Base Excess: 5 mmol/L — ABNORMAL HIGH (ref 0.0–2.0)
Acid-Base Excess: 4 mmol/L — ABNORMAL HIGH (ref 0.0–2.0)
Bicarbonate: 29.1 mmol/L — ABNORMAL HIGH (ref 20.0–28.0)
Bicarbonate: 29.3 mmol/L — ABNORMAL HIGH (ref 20.0–28.0)
Calcium, Ion: 1.2 mmol/L (ref 1.15–1.40)
Calcium, Ion: 1.16 mmol/L (ref 1.15–1.40)
HCT: 31 % — ABNORMAL LOW (ref 36.0–46.0)
HCT: 30 % — ABNORMAL LOW (ref 36.0–46.0)
Hemoglobin: 10.5 g/dL — ABNORMAL LOW (ref 12.0–15.0)
Hemoglobin: 10.2 g/dL — ABNORMAL LOW (ref 12.0–15.0)
O2 Saturation: 100 %
O2 Saturation: 100 %
Potassium: 3.9 mmol/L (ref 3.5–5.1)
Potassium: 3.9 mmol/L (ref 3.5–5.1)
Sodium: 138 mmol/L (ref 135–145)
Sodium: 138 mmol/L (ref 135–145)
TCO2: 30 mmol/L (ref 22–32)
TCO2: 31 mmol/L (ref 22–32)
pCO2 arterial: 42.4 mmHg (ref 32.0–48.0)
pCO2 arterial: 46.2 mmHg (ref 32.0–48.0)
pH, Arterial: 7.444 (ref 7.350–7.450)
pH, Arterial: 7.41 (ref 7.350–7.450)
pO2, Arterial: 358 mmHg — ABNORMAL HIGH (ref 83.0–108.0)
pO2, Arterial: 270 mmHg — ABNORMAL HIGH (ref 83.0–108.0)

## 2020-03-24 LAB — ABO/RH: ABO/RH(D): O POS

## 2020-03-24 LAB — MRSA PCR SCREENING: MRSA by PCR: NEGATIVE

## 2020-03-24 LAB — PREPARE RBC (CROSSMATCH)

## 2020-03-24 SURGERY — WHIPPLE PROCEDURE
Anesthesia: General | Site: Abdomen

## 2020-03-24 MED ORDER — PANTOPRAZOLE SODIUM 40 MG IV SOLR
40.0000 mg | Freq: Every day | INTRAVENOUS | Status: DC
  Administered 2020-03-24 – 2020-03-27 (×4): 40 mg via INTRAVENOUS
  Filled 2020-03-24 (×4): qty 40

## 2020-03-24 MED ORDER — FENTANYL CITRATE (PF) 250 MCG/5ML IJ SOLN
INTRAMUSCULAR | Status: AC
  Filled 2020-03-24: qty 5

## 2020-03-24 MED ORDER — ROCURONIUM BROMIDE 10 MG/ML (PF) SYRINGE
PREFILLED_SYRINGE | INTRAVENOUS | Status: AC
  Filled 2020-03-24: qty 10

## 2020-03-24 MED ORDER — KETAMINE HCL 10 MG/ML IJ SOLN
INTRAMUSCULAR | Status: DC | PRN
  Administered 2020-03-24 (×2): 15 mg via INTRAVENOUS

## 2020-03-24 MED ORDER — SODIUM CHLORIDE 0.9% IV SOLUTION: Freq: Once | INTRAVENOUS | Status: DC

## 2020-03-24 MED ORDER — PROCHLORPERAZINE EDISYLATE 10 MG/2ML IJ SOLN: 5.0000 mg | Freq: Four times a day (QID) | INTRAMUSCULAR | Status: DC | PRN

## 2020-03-24 MED ORDER — ACETAMINOPHEN 10 MG/ML IV SOLN
1000.0000 mg | Freq: Three times a day (TID) | INTRAVENOUS | Status: AC
  Administered 2020-03-24 – 2020-03-25 (×3): 1000 mg via INTRAVENOUS
  Filled 2020-03-24 (×3): qty 100

## 2020-03-24 MED ORDER — ONDANSETRON HCL 4 MG/2ML IJ SOLN
4.0000 mg | Freq: Four times a day (QID) | INTRAMUSCULAR | Status: DC | PRN
  Administered 2020-03-24 – 2020-03-26 (×3): 4 mg via INTRAVENOUS
  Filled 2020-03-24 (×3): qty 2

## 2020-03-24 MED ORDER — DEXAMETHASONE SODIUM PHOSPHATE 10 MG/ML IJ SOLN
INTRAMUSCULAR | Status: AC
  Filled 2020-03-24: qty 1

## 2020-03-24 MED ORDER — LORAZEPAM 2 MG/ML IJ SOLN: 0.5000 mg | Freq: Four times a day (QID) | INTRAMUSCULAR | Status: DC | PRN

## 2020-03-24 MED ORDER — LIDOCAINE HCL (PF) 2 % IJ SOLN
INTRAMUSCULAR | Status: AC
  Filled 2020-03-24: qty 5

## 2020-03-24 MED ORDER — PROPOFOL 10 MG/ML IV BOLUS
INTRAVENOUS | Status: AC
  Filled 2020-03-24: qty 20

## 2020-03-24 MED ORDER — HYDROMORPHONE HCL 1 MG/ML IJ SOLN
0.5000 mg | Freq: Once | INTRAMUSCULAR | Status: AC
  Administered 2020-03-24: 0.5 mg via INTRAVENOUS
  Filled 2020-03-24: qty 1

## 2020-03-24 MED ORDER — CEFAZOLIN SODIUM-DEXTROSE 2-3 GM-%(50ML) IV SOLR
INTRAVENOUS | Status: DC | PRN
  Administered 2020-03-24 (×2): 2 g via INTRAVENOUS

## 2020-03-24 MED ORDER — PROCHLORPERAZINE MALEATE 10 MG PO TABS
10.0000 mg | ORAL_TABLET | Freq: Four times a day (QID) | ORAL | Status: DC | PRN
  Filled 2020-03-24: qty 1

## 2020-03-24 MED ORDER — CHLORHEXIDINE GLUCONATE CLOTH 2 % EX PADS
6.0000 | MEDICATED_PAD | Freq: Every day | CUTANEOUS | Status: DC
  Administered 2020-03-24 – 2020-03-30 (×7): 6 via TOPICAL

## 2020-03-24 MED ORDER — OXYMETAZOLINE HCL 0.05 % NA SOLN
NASAL | Status: DC | PRN
  Administered 2020-03-24: 2 via NASAL

## 2020-03-24 MED ORDER — EPHEDRINE 5 MG/ML INJ
INTRAVENOUS | Status: AC
  Filled 2020-03-24: qty 10

## 2020-03-24 MED ORDER — ONDANSETRON HCL 4 MG/2ML IJ SOLN
INTRAMUSCULAR | Status: AC
  Filled 2020-03-24: qty 2

## 2020-03-24 MED ORDER — LEVOTHYROXINE SODIUM 50 MCG PO TABS
50.0000 ug | ORAL_TABLET | Freq: Every day | ORAL | Status: DC
  Administered 2020-03-25 – 2020-03-28 (×4): 50 ug
  Filled 2020-03-24 (×4): qty 1

## 2020-03-24 MED ORDER — WATER FOR IRRIGATION, STERILE IR SOLN
Status: DC | PRN
  Administered 2020-03-24 (×2): 1000 mL

## 2020-03-24 MED ORDER — LACTATED RINGERS IV SOLN: INTRAVENOUS | Status: DC | PRN

## 2020-03-24 MED ORDER — BUPIVACAINE HCL (PF) 0.25 % IJ SOLN
INTRAMUSCULAR | Status: AC
  Filled 2020-03-24: qty 30

## 2020-03-24 MED ORDER — ACETAMINOPHEN 500 MG PO TABS: 1000.0000 mg | ORAL_TABLET | ORAL | Status: AC

## 2020-03-24 MED ORDER — PHENYLEPHRINE HCL-NACL 10-0.9 MG/250ML-% IV SOLN
INTRAVENOUS | Status: DC | PRN
  Administered 2020-03-24: 30 ug/min via INTRAVENOUS

## 2020-03-24 MED ORDER — SCOPOLAMINE 1 MG/3DAYS TD PT72
MEDICATED_PATCH | TRANSDERMAL | Status: AC
  Administered 2020-03-24: 1.5 mg via TRANSDERMAL
  Filled 2020-03-24: qty 1

## 2020-03-24 MED ORDER — CHLORHEXIDINE GLUCONATE CLOTH 2 % EX PADS: 6.0000 | MEDICATED_PAD | Freq: Once | CUTANEOUS | Status: DC

## 2020-03-24 MED ORDER — LIDOCAINE-EPINEPHRINE 1 %-1:100000 IJ SOLN
INTRAMUSCULAR | Status: DC | PRN
  Administered 2020-03-24: 3.5 mL

## 2020-03-24 MED ORDER — LIDOCAINE-EPINEPHRINE 1 %-1:100000 IJ SOLN
INTRAMUSCULAR | Status: AC
  Filled 2020-03-24: qty 1

## 2020-03-24 MED ORDER — MIDAZOLAM HCL 5 MG/5ML IJ SOLN
INTRAMUSCULAR | Status: DC | PRN
  Administered 2020-03-24: 2 mg via INTRAVENOUS

## 2020-03-24 MED ORDER — PHENYLEPHRINE 40 MCG/ML (10ML) SYRINGE FOR IV PUSH (FOR BLOOD PRESSURE SUPPORT)
PREFILLED_SYRINGE | INTRAVENOUS | Status: AC
  Filled 2020-03-24: qty 10

## 2020-03-24 MED ORDER — PROPOFOL 10 MG/ML IV BOLUS
INTRAVENOUS | Status: DC | PRN
  Administered 2020-03-24: 30 mg via INTRAVENOUS
  Administered 2020-03-24: 120 mg via INTRAVENOUS

## 2020-03-24 MED ORDER — SUCCINYLCHOLINE CHLORIDE 200 MG/10ML IV SOSY
PREFILLED_SYRINGE | INTRAVENOUS | Status: AC
  Filled 2020-03-24: qty 10

## 2020-03-24 MED ORDER — LACTATED RINGERS IV SOLN: INTRAVENOUS | Status: DC

## 2020-03-24 MED ORDER — MIDAZOLAM HCL 2 MG/2ML IJ SOLN
INTRAMUSCULAR | Status: AC
  Filled 2020-03-24: qty 2

## 2020-03-24 MED ORDER — CHLORHEXIDINE GLUCONATE 0.12 % MT SOLN
OROMUCOSAL | Status: AC
  Administered 2020-03-24: 15 mL
  Filled 2020-03-24: qty 15

## 2020-03-24 MED ORDER — ACETAMINOPHEN 500 MG PO TABS
ORAL_TABLET | ORAL | Status: AC
  Administered 2020-03-24: 1000 mg via ORAL
  Filled 2020-03-24: qty 2

## 2020-03-24 MED ORDER — OXYMETAZOLINE HCL 0.05 % NA SOLN
NASAL | Status: AC
  Filled 2020-03-24: qty 30

## 2020-03-24 MED ORDER — 0.9 % SODIUM CHLORIDE (POUR BTL) OPTIME
TOPICAL | Status: DC | PRN
  Administered 2020-03-24 (×5): 1000 mL

## 2020-03-24 MED ORDER — ONDANSETRON 4 MG PO TBDP: 4.0000 mg | ORAL_TABLET | Freq: Four times a day (QID) | ORAL | Status: DC | PRN

## 2020-03-24 MED ORDER — SUGAMMADEX SODIUM 200 MG/2ML IV SOLN
INTRAVENOUS | Status: DC | PRN
  Administered 2020-03-24: 150 mg via INTRAVENOUS

## 2020-03-24 MED ORDER — LIDOCAINE 2% (20 MG/ML) 5 ML SYRINGE
INTRAMUSCULAR | Status: DC | PRN
  Administered 2020-03-24: 40 mg via INTRAVENOUS

## 2020-03-24 MED ORDER — EPHEDRINE SULFATE 50 MG/ML IJ SOLN
INTRAMUSCULAR | Status: DC | PRN
  Administered 2020-03-24 (×5): 5 mg via INTRAVENOUS

## 2020-03-24 MED ORDER — ENSURE PRE-SURGERY PO LIQD: 592.0000 mL | Freq: Once | ORAL | Status: DC

## 2020-03-24 MED ORDER — ALBUMIN HUMAN 5 % IV SOLN
INTRAVENOUS | Status: AC
  Filled 2020-03-24: qty 250

## 2020-03-24 MED ORDER — SCOPOLAMINE 1 MG/3DAYS TD PT72: 1.0000 | MEDICATED_PATCH | TRANSDERMAL | Status: AC

## 2020-03-24 MED ORDER — FENTANYL CITRATE (PF) 100 MCG/2ML IJ SOLN: 25.0000 ug | INTRAMUSCULAR | Status: DC | PRN

## 2020-03-24 MED ORDER — ROPIVACAINE HCL 2 MG/ML IJ SOLN
8.0000 mL/h | INTRAMUSCULAR | Status: AC
  Administered 2020-03-25 – 2020-03-28 (×4): 8 mL/h via EPIDURAL
  Filled 2020-03-24 (×6): qty 200

## 2020-03-24 MED ORDER — CEFAZOLIN SODIUM-DEXTROSE 2-4 GM/100ML-% IV SOLN
INTRAVENOUS | Status: AC
  Filled 2020-03-24: qty 100

## 2020-03-24 MED ORDER — ROCURONIUM BROMIDE 10 MG/ML (PF) SYRINGE
PREFILLED_SYRINGE | INTRAVENOUS | Status: DC | PRN
  Administered 2020-03-24: 20 mg via INTRAVENOUS
  Administered 2020-03-24: 60 mg via INTRAVENOUS
  Administered 2020-03-24: 40 mg via INTRAVENOUS
  Administered 2020-03-24: 30 mg via INTRAVENOUS
  Administered 2020-03-24: 20 mg via INTRAVENOUS

## 2020-03-24 MED ORDER — KETAMINE HCL 50 MG/5ML IJ SOSY
PREFILLED_SYRINGE | INTRAMUSCULAR | Status: AC
  Filled 2020-03-24: qty 5

## 2020-03-24 MED ORDER — FENTANYL CITRATE (PF) 250 MCG/5ML IJ SOLN
INTRAMUSCULAR | Status: DC | PRN
  Administered 2020-03-24 (×3): 50 ug via INTRAVENOUS
  Administered 2020-03-24: 100 ug via INTRAVENOUS
  Administered 2020-03-24: 50 ug via INTRAVENOUS

## 2020-03-24 MED ORDER — ONDANSETRON HCL 4 MG/2ML IJ SOLN
INTRAMUSCULAR | Status: DC | PRN
  Administered 2020-03-24: 4 mg via INTRAVENOUS

## 2020-03-24 MED ORDER — BUPIVACAINE HCL (PF) 0.25 % IJ SOLN
INTRAMUSCULAR | Status: DC | PRN
  Administered 2020-03-24: 3.5 mL

## 2020-03-24 MED ORDER — CEFAZOLIN SODIUM 1 G IJ SOLR
INTRAMUSCULAR | Status: AC
  Filled 2020-03-24: qty 20

## 2020-03-24 MED ORDER — LEVOTHYROXINE SODIUM 25 MCG/ML PO SOLN: 50.0000 ug | Freq: Every day | ORAL | Status: DC

## 2020-03-24 MED ORDER — DEXAMETHASONE SODIUM PHOSPHATE 10 MG/ML IJ SOLN
INTRAMUSCULAR | Status: DC | PRN
  Administered 2020-03-24: 5 mg via INTRAVENOUS

## 2020-03-24 MED ORDER — ALBUMIN HUMAN 5 % IV SOLN: INTRAVENOUS | Status: DC | PRN

## 2020-03-24 MED ORDER — PROCHLORPERAZINE EDISYLATE 10 MG/2ML IJ SOLN
5.0000 mg | Freq: Four times a day (QID) | INTRAMUSCULAR | Status: DC | PRN
  Administered 2020-03-25 – 2020-03-26 (×2): 10 mg via INTRAVENOUS
  Filled 2020-03-24 (×3): qty 2

## 2020-03-24 MED ORDER — CEFAZOLIN SODIUM-DEXTROSE 2-4 GM/100ML-% IV SOLN: 2.0000 g | INTRAVENOUS | Status: DC

## 2020-03-24 MED ORDER — VISTASEAL 10 ML SINGLE DOSE KIT
10.0000 mL | PACK | Freq: Once | CUTANEOUS | Status: AC
  Administered 2020-03-24: 10 mL via TOPICAL
  Filled 2020-03-24: qty 10

## 2020-03-24 MED ORDER — PHENYLEPHRINE HCL (PRESSORS) 10 MG/ML IV SOLN
INTRAVENOUS | Status: DC | PRN
  Administered 2020-03-24 (×2): 40 ug via INTRAVENOUS

## 2020-03-24 MED ORDER — ALBUMIN HUMAN 5 % IV SOLN
12.5000 g | Freq: Once | INTRAVENOUS | Status: AC
  Administered 2020-03-24: 12.5 g via INTRAVENOUS

## 2020-03-24 MED ORDER — LACTATED RINGERS IV BOLUS
1000.0000 mL | Freq: Once | INTRAVENOUS | Status: AC
  Administered 2020-03-24: 1000 mL via INTRAVENOUS

## 2020-03-24 MED ORDER — ROPIVACAINE HCL 2 MG/ML IJ SOLN
INTRAMUSCULAR | Status: DC | PRN
  Administered 2020-03-24: 10 mL/h via EPIDURAL

## 2020-03-24 MED ORDER — ROPIVACAINE HCL 5 MG/ML IJ SOLN
INTRAMUSCULAR | Status: DC | PRN
  Administered 2020-03-24: 5 mL via EPIDURAL
  Administered 2020-03-24: 2.5 mL via EPIDURAL

## 2020-03-24 MED ORDER — ENOXAPARIN SODIUM 40 MG/0.4ML ~~LOC~~ SOLN: 40.0000 mg | SUBCUTANEOUS | Status: DC

## 2020-03-24 MED ORDER — ENSURE PRE-SURGERY PO LIQD: 296.0000 mL | Freq: Once | ORAL | Status: DC

## 2020-03-24 SURGICAL SUPPLY — 94 items
APL PRP STRL LF DISP 70% ISPRP (MISCELLANEOUS) ×1
BAG BILE T-TUBES STRL (MISCELLANEOUS) ×4 IMPLANT
BAG DRN 9.5 2 ADJ BELT ADPR (MISCELLANEOUS) ×2
BIOPATCH RED 1 DISK 7.0 (GAUZE/BANDAGES/DRESSINGS) ×2 IMPLANT
BLADE CLIPPER SURG (BLADE) IMPLANT
BLADE SURG 10 STRL SS (BLADE) ×2 IMPLANT
BOOT SUTURE AID YELLOW STND (SUTURE) ×3 IMPLANT
CHLORAPREP W/TINT 26 (MISCELLANEOUS) ×2 IMPLANT
CLIP VESOCCLUDE LG 6/CT (CLIP) ×2 IMPLANT
CLIP VESOCCLUDE MED 24/CT (CLIP) ×2 IMPLANT
CLIP VESOLOCK LG 6/CT PURPLE (CLIP) ×7 IMPLANT
CLIP VESOLOCK MED 6/CT (CLIP) ×2 IMPLANT
CLIP VESOLOCK MED LG 6/CT (CLIP) ×2 IMPLANT
COVER MAYO STAND STRL (DRAPES) ×2 IMPLANT
COVER SURGICAL LIGHT HANDLE (MISCELLANEOUS) ×2 IMPLANT
DECANTER SPIKE VIAL GLASS SM (MISCELLANEOUS) ×4 IMPLANT
DRAIN CHANNEL 19F RND (DRAIN) ×4 IMPLANT
DRAPE WARM FLUID 44X44 (DRAPES) ×2 IMPLANT
DRSG COVADERM 4X14 (GAUZE/BANDAGES/DRESSINGS) ×1 IMPLANT
DRSG COVADERM PLUS 2X2 (GAUZE/BANDAGES/DRESSINGS) ×1 IMPLANT
DRSG TEGADERM 4X4.75 (GAUZE/BANDAGES/DRESSINGS) ×1 IMPLANT
ELECT BLADE 4.0 EZ CLEAN MEGAD (MISCELLANEOUS) ×2
ELECT BLADE 6.5 EXT (BLADE) ×2 IMPLANT
ELECT CAUTERY BLADE 6.4 (BLADE) ×2 IMPLANT
ELECT REM PT RETURN 9FT ADLT (ELECTROSURGICAL) ×2
ELECTRODE BLDE 4.0 EZ CLN MEGD (MISCELLANEOUS) IMPLANT
ELECTRODE REM PT RTRN 9FT ADLT (ELECTROSURGICAL) ×1 IMPLANT
GLOVE BIO SURGEON STRL SZ 6 (GLOVE) ×4 IMPLANT
GLOVE BIOGEL PI IND STRL 6.5 (GLOVE) IMPLANT
GLOVE BIOGEL PI INDICATOR 6.5 (GLOVE) ×5
GLOVE INDICATOR 6.5 STRL GRN (GLOVE) ×4 IMPLANT
GLOVE SURG ORTHO 7.0 STRL STRW (GLOVE) ×2 IMPLANT
GLOVE SURG SS PI 6.5 STRL IVOR (GLOVE) ×3 IMPLANT
GOWN STRL REUS W/ TWL LRG LVL3 (GOWN DISPOSABLE) ×2 IMPLANT
GOWN STRL REUS W/TWL 2XL LVL3 (GOWN DISPOSABLE) ×4 IMPLANT
GOWN STRL REUS W/TWL LRG LVL3 (GOWN DISPOSABLE) ×12
HANDLE SUCTION POOLE (INSTRUMENTS) IMPLANT
KIT BASIN OR (CUSTOM PROCEDURE TRAY) ×2 IMPLANT
KIT MARKER MARGIN INK (KITS) ×2 IMPLANT
KIT TUBE JEJUNAL 16FR (CATHETERS) ×1 IMPLANT
KIT TURNOVER KIT B (KITS) ×2 IMPLANT
L-HOOK LAP DISP 36CM (ELECTROSURGICAL) ×2
LHOOK LAP DISP 36CM (ELECTROSURGICAL) ×1 IMPLANT
LOOP VESSEL MAXI BLUE (MISCELLANEOUS) ×2 IMPLANT
LOOP VESSEL MINI RED (MISCELLANEOUS) ×2 IMPLANT
NEEDLE 22X1 1/2 (OR ONLY) (NEEDLE) ×2 IMPLANT
NS IRRIG 1000ML POUR BTL (IV SOLUTION) ×7 IMPLANT
PAD ARMBOARD 7.5X6 YLW CONV (MISCELLANEOUS) ×4 IMPLANT
PENCIL BUTTON HOLSTER BLD 10FT (ELECTRODE) ×2 IMPLANT
PENCIL SMOKE EVACUATOR (MISCELLANEOUS) ×2 IMPLANT
RELOAD PROXIMATE 75MM BLUE (ENDOMECHANICALS) ×6 IMPLANT
RELOAD STAPLE 75 3.8 BLU REG (ENDOMECHANICALS) IMPLANT
SET TUBE SMOKE EVAC HIGH FLOW (TUBING) ×2 IMPLANT
SHEARS FOC LG CVD HARMONIC 17C (MISCELLANEOUS) ×2 IMPLANT
SLEEVE ENDOPATH XCEL 5M (ENDOMECHANICALS) ×2 IMPLANT
SLEEVE SUCTION CATH 165 (SLEEVE) ×2 IMPLANT
SPONGE LAP 18X18 RF (DISPOSABLE) ×9 IMPLANT
STAPLER PROXIMATE 75MM BLUE (STAPLE) ×2 IMPLANT
STAPLER VISISTAT 35W (STAPLE) ×2 IMPLANT
SUCTION POOLE HANDLE (INSTRUMENTS) ×2
SUT ETHILON 2 0 FS 18 (SUTURE) ×4 IMPLANT
SUT ETHILON 2 LR (SUTURE) IMPLANT
SUT MNCRL AB 4-0 PS2 18 (SUTURE) ×1 IMPLANT
SUT PDS 5 0 RB 1 (SUTURE) ×5 IMPLANT
SUT PDS AB 1 TP1 96 (SUTURE) ×4 IMPLANT
SUT PDS AB 3-0 SH 27 (SUTURE) ×9 IMPLANT
SUT PDS AB 4-0 RB1 27 (SUTURE) ×24 IMPLANT
SUT PROLENE 3 0 SH 48 (SUTURE) ×5 IMPLANT
SUT PROLENE 4 0 RB 1 (SUTURE) ×4
SUT PROLENE 4-0 RB1 .5 CRCL 36 (SUTURE) ×2 IMPLANT
SUT PROLENE 5 0 RB 1 DA (SUTURE) ×2 IMPLANT
SUT SILK 2 0 SH CR/8 (SUTURE) ×4 IMPLANT
SUT SILK 2 0 TIES 10X30 (SUTURE) ×2 IMPLANT
SUT SILK 3 0 SH CR/8 (SUTURE) ×2 IMPLANT
SUT SILK 3 0 TIES 10X30 (SUTURE) ×2 IMPLANT
SUT VIC AB 2-0 CT1 27 (SUTURE)
SUT VIC AB 2-0 CT1 TAPERPNT 27 (SUTURE) IMPLANT
SUT VIC AB 2-0 SH 18 (SUTURE) ×1 IMPLANT
SUT VIC AB 3-0 SH 18 (SUTURE) ×1 IMPLANT
SUT VIC AB 3-0 SH 27 (SUTURE) ×2
SUT VIC AB 3-0 SH 27X BRD (SUTURE) ×1 IMPLANT
SUT VIC AB 3-0 SH 8-18 (SUTURE) ×1 IMPLANT
SUT VICRYL AB 2 0 TIES (SUTURE) IMPLANT
TAPE UMBILICAL 1/8 X36 TWILL (MISCELLANEOUS) ×1 IMPLANT
TOWEL GREEN STERILE (TOWEL DISPOSABLE) ×2 IMPLANT
TOWEL GREEN STERILE FF (TOWEL DISPOSABLE) ×2 IMPLANT
TRAY FOLEY MTR SLVR 14FR STAT (SET/KITS/TRAYS/PACK) ×2 IMPLANT
TRAY LAPAROSCOPIC MC (CUSTOM PROCEDURE TRAY) ×2 IMPLANT
TROCAR XCEL NON-BLD 5MMX100MML (ENDOMECHANICALS) ×2 IMPLANT
TUBE CONNECTING 12X1/4 (SUCTIONS) ×2 IMPLANT
TUBE FEEDING 8FR 16IN STR KANG (MISCELLANEOUS) IMPLANT
TUBE FEEDING ENTERAL 5FR 16IN (TUBING) ×1 IMPLANT
WARMER LAPAROSCOPE (MISCELLANEOUS) ×2 IMPLANT
YANKAUER SUCT BULB TIP NO VENT (SUCTIONS) ×2 IMPLANT

## 2020-03-24 NOTE — Anesthesia Procedure Notes (Signed)
Epidural Patient location during procedure: pre-op Start time: 03/24/2020 7:00 AM End time: 03/24/2020 7:15 AM  Staffing Anesthesiologist: Freddrick March, MD Performed: anesthesiologist   Preanesthetic Checklist Completed: patient identified, IV checked, risks and benefits discussed, monitors and equipment checked, pre-op evaluation and timeout performed  Epidural Patient position: sitting Prep: DuraPrep and site prepped and draped Patient monitoring: continuous pulse ox, blood pressure, heart rate and cardiac monitor Approach: midline Location: thoracic (1-12) (T8-T9) Injection technique: LOR air  Needle:  Needle type: Tuohy  Needle gauge: 17 G Needle length: 9 cm Needle insertion depth: 4 cm Catheter type: closed end flexible Catheter size: 19 Gauge Catheter at skin depth: 14 cm Test dose: negative  Assessment Events: blood not aspirated, injection not painful, no injection resistance, no paresthesia and negative IV test  Additional Notes Patient identified. Risks/Benefits/Options discussed with patient including but not limited to bleeding, infection, nerve damage, paralysis, failed block, incomplete pain control, headache, blood pressure changes, nausea, vomiting, reactions to medication both or allergic, itching and postpartum back pain. Confirmed with bedside nurse the patient's most recent platelet count. Confirmed with patient that they are not currently taking any anticoagulation, have any bleeding history or any family history of bleeding disorders. Patient expressed understanding and wished to proceed. All questions were answered. Sterile technique was used throughout the entire procedure. Please see nursing notes for vital signs. Test dose was given through epidural catheter and negative prior to continuing to dose epidural or start infusion. Warning signs of high block given to the patient including shortness of breath, tingling/numbness in hands, complete motor  block, or any concerning symptoms with instructions to call for help. Patient was given instructions on fall risk and not to get out of bed. All questions and concerns addressed with instructions to call with any issues or inadequate analgesia.  Reason for block:procedure for pain

## 2020-03-24 NOTE — Anesthesia Procedure Notes (Signed)
Procedure Name: Intubation Date/Time: 03/24/2020 8:03 AM Performed by: Glynda Jaeger, CRNA Pre-anesthesia Checklist: Patient identified, Emergency Drugs available, Suction available and Patient being monitored Patient Re-evaluated:Patient Re-evaluated prior to induction Oxygen Delivery Method: Circle System Utilized Preoxygenation: Pre-oxygenation with 100% oxygen Induction Type: IV induction Ventilation: Mask ventilation without difficulty Laryngoscope Size: Mac and 3 Grade View: Grade I Tube type: Oral Tube size: 7.0 mm Number of attempts: 1 Airway Equipment and Method: Stylet Placement Confirmation: ETT inserted through vocal cords under direct vision,  positive ETCO2 and breath sounds checked- equal and bilateral Secured at: 22 (at lip) cm Tube secured with: Tape Dental Injury: Teeth and Oropharynx as per pre-operative assessment  Comments: Performed by Desma Mcgregor

## 2020-03-24 NOTE — Op Note (Signed)
PREOPERATIVE DIAGNOSIS: adenocarcinoma of the pancreatic head  POSTOPERATIVE DIAGNOSIS: Same.   PROCEDURES PERFORMED:  Diagnostic laparoscopy  Classic pancreaticoduodenectomy   Placement of pancreatic duct stent   SURGEON: Stark Klein, MD   ASSISTANT: Sheria Lang, MD, PGY7   ANESTHESIA: General and epidural   FINDINGS: 2 cm pancreatic head mass. soft pancreatic tissue. 10 mm common bile duct. 2 mm pancreatic duct  SPECIMENS:  1. Pancreaticoduodenectomy with gallbladder:  2. Portal  nodes  3.  Common hepatic artery nodes 4.  Aortocaval nodes  ESTIMATED BLOOD LOSS: 400 mL.   COMPLICATIONS: None known.   PROCEDURE:   Pt was identified in the holding area and taken to  the operating room, and placed supine on the operating room  table. General anesthesia was induced. The patient's abdomen was  prepped and draped in a sterile fashion, after a Foley catheter was  placed. A time-out was performed according to the surgical safety check  list. When all was correct we continued.   The patient was placed in reverse trendelenburg position and rotated to the right.  The left subcostal margin was anesthetized with local anesthesia.  A 5 mm optiview trocar was placed under direct visualization.  The abdomen was insufflated with carbon dioxide.  The abdomen was examined.  A second port was placed in the upper midline to be able to better visualize the right liver.  No evidence of carcinomatosis was seen.    A midline incision was made from the xiphoid to just above the umbilicus. The subcutaneous tissues were divided with the Bovie cautery. The peritoneum was entered in the center of the abdomen. Digital retraction was then used to elevate the preperitoneal fat, and  this was taken with the cautery as well. The subcutaneous tissues and  fascia of the muscular layers were taken laterally with the cautery.  Care was taken to protect the underlying viscera.   The Bookwalter self-retaining  retractor was placed to assist wtih visualization. The right colon was taken down off of the white line  of Toldt and from the retroperitoneum at the hepatic flexure. The porta was identified. The  duodenum was kocherized extensively with blunt dissection and with cautery. The gallbladder was taken off the liver with a combination of blunt dissection and cautery. The cystic duct was clipped with the Hemalock clips. The cystic duct was divided and the gallbladder was passed off.   The common bile duct was skeletonized near the duodenum. A vessel loop was passed around it. The gastroduodenal artery, as well as the common hepatic artery were skeletonized. The proper hepatic artery was traced out to make sure that flow was going to both sides of the liver when the GDA was clamped. The GDA was test clamped with the bulldog, with good flow to the liver and  no signs of ischemia. This was divided with 2-0 silk ties and then clipped. The proper hepatic artery was reflected upward, and the anterior portal vein was exposed.  A Kelly clamp was passed underneath the pancreas at the superior mesenteric vein, and this passed easily with no signs of tumor involvement. Large nodes were taken off with the harmonic along the common hepatic artery and the portal vein.    Attention was then directed to the stomach, and the omentum was taken  off of the stomach at the border of the antrum and the body. The  gastrohepatic ligament was taken down with the harmonic, and care was  taken to make sure there was  not a replaced left hepatic artery in this  location. The stomach was divided with the GIA-75 stapler. The border  of the stomach was oversewn with a 3-0 running PDS suture.   Attention was then directed to the small bowel. Around 10 cm past the  ligament of Treitz was located, and this was divided with the 75-GIA.  The distal portion of the jejunum was also oversewn with a 3-0 PDS  suture. The fourth portion of the  duodenum was skeletonized with the  harmonic scalpel, taking down all of the mesenteric vessels. The  ligament of Treitz was taken down. The IMV was preserved.  The duodenum was then passed underneath the portal vein.   At this point the Claiborne Billings was replaced and the pancreas was divided with the cautery. 2-0 silk sutures were tied down and the inferior and superior border of the pancreas. The Bovie was used to coagulate the small bleeders at the border of the pancreas.  The Overholt in combination with the harmonic and locking Weck clips  were then used to take the uncinate process off of the portal vein and  the superior mesenteric artery. Care was taken not to incorporate the  superior mesenteric artery in the dissection. The specimen was then marked and passed off the table for frozen section margin. An aortocaval node was taken off with the harmonic.  The frozen returned as clear pancreatic and biliary margins.    The jejunum was then passed underneath  the SMV in order to get appropriate lie for the pancreatic and biliary  anastomoses. The more distal portion of the jejunum was pulled up over  the colon, and two 3-0 silks were placed through the posterior border  of the stomach for the gastrojejunostomy. The stomach and the small  bowel were opened, and a GIA-75 was used to create an end-to-end  anastomosis. The open areas of the staple line were examined to ensure  that there was hemostasis. The defect was then closed with a single  layer of running Connell suture of 3-0 PDS. Prior to a complete  closure, the NG tube was passed toward the afferent limb.   The appropriate location for the choledochojejunostomy was identified, and  the small bowel was opened approximately 10 mm. The anastamosis was created with approximately eleven 4-0 interrupted PDS sutures.   The 2 corner sutures were placed first  and then the posterior layer was done in an interrupted fashion tying on  the inside.  The superior layer was then closed with interrupted sutures as  well.    The pancreatic anastomosis was then created by opening the jejunum the length  of the pancreatic parenchyma. The pancreas was soft, and the duct was 2 mm. A pediatric feeding tube was used as a pancreatic stent. The posterior layer was formed first with 2-0 silk sutures in interrupted fashion. A duct-to-mucosa anastamosis was created with five 5-0 PDS interrupted sutures. The anterior layer was then oversewn with 2-0 silks to dunk the pancreatic parenchyma.   The areas were then irrigated and then those anastomoses were covered  with Vistaseal. This was allowed to dry.  The abdomen was then irrigated  again and all the laparotomy sponges were removed. A lap count was  performed, which was correct. Two 19-Blake drains were placed, with the  lateral-most drain placed behind the choledochojejunostomy. The medial  Blake drain was placed just anterior and slightly superior to the  pancreaticojejunostomy.  The fascia was then closed with #1  looped running PDS sutures. The skin was irrigated and then closed with  staples. The wounds were cleaned, dried and dressed with a sterile  dressing.   The patient tolerated the procedure well and was extubated and taken to  PACU in stable condition. Needle and sponge counts were correct x2.

## 2020-03-24 NOTE — Anesthesia Procedure Notes (Signed)
Arterial Line Insertion Start/End11/23/2021 7:01 AM Performed by: Freddrick March, MD, CRNA  Patient location: Pre-op. Preanesthetic checklist: patient identified, IV checked, site marked, risks and benefits discussed, surgical consent, monitors and equipment checked, pre-op evaluation, timeout performed and anesthesia consent Lidocaine 1% used for infiltration Left, radial was placed Catheter size: 20 Fr Hand hygiene performed  and maximum sterile barriers used   Attempts: 1 Procedure performed without using ultrasound guided technique. Following insertion, dressing applied. Post procedure assessment: normal and unchanged  Additional procedure comments: Performed by Desma Mcgregor.

## 2020-03-24 NOTE — Interval H&P Note (Signed)
History and Physical Interval Note:  03/24/2020 7:41 AM  Breanna Noble  has presented today for surgery, with the diagnosis of PANCREATIC CANCER.  The various methods of treatment have been discussed with the patient and family. After consideration of risks, benefits and other options for treatment, the patient has consented to  Procedure(s): WHIPPLE PROCEDURE (N/A) LAPAROSCOPY DIAGNOSTIC (N/A) APPENDECTOMY (N/A) as a surgical intervention.  The patient's history has been reviewed, patient examined, no change in status, stable for surgery.  I have reviewed the patient's chart and labs.  Questions were answered to the patient's satisfaction.     Stark Klein

## 2020-03-24 NOTE — Transfer of Care (Signed)
Immediate Anesthesia Transfer of Care Note  Patient: Breanna Noble  Procedure(s) Performed: WHIPPLE PROCEDURE (N/A Abdomen) LAPAROSCOPY DIAGNOSTIC (N/A Abdomen) APPENDECTOMY (N/A Abdomen)  Patient Location: PACU  Anesthesia Type:General  Level of Consciousness: awake, patient cooperative and responds to stimulation  Airway & Oxygen Therapy: Patient Spontanous Breathing and Patient connected to face mask oxygen  Post-op Assessment: Report given to RN and Post -op Vital signs reviewed and stable  Post vital signs: Reviewed and stable  Last Vitals:  Vitals Value Taken Time  BP 105/61 03/24/20 1313  Temp    Pulse 98 03/24/20 1317  Resp 24 03/24/20 1317  SpO2 100 % 03/24/20 1317  Vitals shown include unvalidated device data.  Last Pain:  Vitals:   03/24/20 0622  TempSrc:   PainSc: 0-No pain         Complications: No complications documented.

## 2020-03-25 ENCOUNTER — Encounter (HOSPITAL_COMMUNITY): Payer: Self-pay | Admitting: General Surgery

## 2020-03-25 LAB — POCT I-STAT 7, (LYTES, BLD GAS, ICA,H+H)
Acid-Base Excess: 2 mmol/L (ref 0.0–2.0)
Bicarbonate: 26.9 mmol/L (ref 20.0–28.0)
Calcium, Ion: 1.16 mmol/L (ref 1.15–1.40)
HCT: 30 % — ABNORMAL LOW (ref 36.0–46.0)
Hemoglobin: 10.2 g/dL — ABNORMAL LOW (ref 12.0–15.0)
O2 Saturation: 100 %
Potassium: 4.4 mmol/L (ref 3.5–5.1)
Sodium: 136 mmol/L (ref 135–145)
TCO2: 28 mmol/L (ref 22–32)
pCO2 arterial: 43.5 mmHg (ref 32.0–48.0)
pH, Arterial: 7.399 (ref 7.350–7.450)
pO2, Arterial: 227 mmHg — ABNORMAL HIGH (ref 83.0–108.0)

## 2020-03-25 LAB — COMPREHENSIVE METABOLIC PANEL
ALT: 95 U/L — ABNORMAL HIGH (ref 0–44)
AST: 96 U/L — ABNORMAL HIGH (ref 15–41)
Albumin: 2.8 g/dL — ABNORMAL LOW (ref 3.5–5.0)
Alkaline Phosphatase: 123 U/L (ref 38–126)
Anion gap: 12 (ref 5–15)
BUN: 8 mg/dL (ref 6–20)
CO2: 21 mmol/L — ABNORMAL LOW (ref 22–32)
Calcium: 8.6 mg/dL — ABNORMAL LOW (ref 8.9–10.3)
Chloride: 101 mmol/L (ref 98–111)
Creatinine, Ser: 0.6 mg/dL (ref 0.44–1.00)
GFR, Estimated: >60 mL/min (ref >60)
Glucose, Bld: 196 mg/dL — ABNORMAL HIGH (ref 70–99)
Potassium: 3.8 mmol/L (ref 3.5–5.1)
Sodium: 134 mmol/L — ABNORMAL LOW (ref 135–145)
Total Bilirubin: 0.7 mg/dL (ref 0.3–1.2)
Total Protein: 6.1 g/dL — ABNORMAL LOW (ref 6.5–8.1)

## 2020-03-25 LAB — GLUCOSE, CAPILLARY
Glucose-Capillary: 154 mg/dL — ABNORMAL HIGH (ref 70–99)
Glucose-Capillary: 149 mg/dL — ABNORMAL HIGH (ref 70–99)
Glucose-Capillary: 143 mg/dL — ABNORMAL HIGH (ref 70–99)
Glucose-Capillary: 133 mg/dL — ABNORMAL HIGH (ref 70–99)
Glucose-Capillary: 125 mg/dL — ABNORMAL HIGH (ref 70–99)

## 2020-03-25 LAB — MAGNESIUM: Magnesium: 1.7 mg/dL (ref 1.7–2.4)

## 2020-03-25 LAB — CBC
HCT: 31.7 % — ABNORMAL LOW (ref 36.0–46.0)
Hemoglobin: 10.2 g/dL — ABNORMAL LOW (ref 12.0–15.0)
MCH: 31 pg (ref 26.0–34.0)
MCHC: 32.2 g/dL (ref 30.0–36.0)
MCV: 96.4 fL (ref 80.0–100.0)
Platelets: 179 10*3/uL (ref 150–400)
RBC: 3.29 MIL/uL — ABNORMAL LOW (ref 3.87–5.11)
RDW: 17.5 % — ABNORMAL HIGH (ref 11.5–15.5)
WBC: 24.4 10*3/uL — ABNORMAL HIGH (ref 4.0–10.5)
nRBC: 0 % (ref 0.0–0.2)

## 2020-03-25 MED ORDER — INSULIN ASPART 100 UNIT/ML ~~LOC~~ SOLN
0.0000 [IU] | SUBCUTANEOUS | Status: DC
  Administered 2020-03-25 (×2): 1 [IU] via SUBCUTANEOUS
  Administered 2020-03-25: 2 [IU] via SUBCUTANEOUS
  Administered 2020-03-25 – 2020-03-28 (×4): 1 [IU] via SUBCUTANEOUS

## 2020-03-25 MED ORDER — DIPHENHYDRAMINE HCL 12.5 MG/5ML PO ELIX: 12.5000 mg | ORAL_SOLUTION | Freq: Four times a day (QID) | ORAL | Status: DC | PRN

## 2020-03-25 MED ORDER — NALOXONE HCL 0.4 MG/ML IJ SOLN: 0.4000 mg | INTRAMUSCULAR | Status: DC | PRN

## 2020-03-25 MED ORDER — POTASSIUM CHLORIDE 10 MEQ/100ML IV SOLN
10.0000 meq | INTRAVENOUS | Status: AC
  Administered 2020-03-25 (×2): 10 meq via INTRAVENOUS
  Filled 2020-03-25 (×3): qty 100

## 2020-03-25 MED ORDER — FENTANYL 50 MCG/ML IV PCA SOLN
INTRAVENOUS | Status: DC
  Administered 2020-03-25: 60 ug via INTRAVENOUS
  Administered 2020-03-25: 0 ug via INTRAVENOUS
  Administered 2020-03-25: 10 ug via INTRAVENOUS
  Administered 2020-03-25: 20 ug via INTRAVENOUS
  Administered 2020-03-26: 10 ug via INTRAVENOUS
  Filled 2020-03-25: qty 20

## 2020-03-25 MED ORDER — ACETAMINOPHEN 10 MG/ML IV SOLN
1000.0000 mg | Freq: Four times a day (QID) | INTRAVENOUS | Status: AC
  Administered 2020-03-25 – 2020-03-26 (×4): 1000 mg via INTRAVENOUS
  Filled 2020-03-25 (×4): qty 100

## 2020-03-25 MED ORDER — KCL IN DEXTROSE-NACL 20-5-0.45 MEQ/L-%-% IV SOLN
INTRAVENOUS | Status: DC
  Filled 2020-03-25 (×3): qty 1000

## 2020-03-25 MED ORDER — SODIUM CHLORIDE 0.9% FLUSH: 9.0000 mL | INTRAVENOUS | Status: DC | PRN

## 2020-03-25 MED ORDER — DIPHENHYDRAMINE HCL 50 MG/ML IJ SOLN: 12.5000 mg | Freq: Four times a day (QID) | INTRAMUSCULAR | Status: DC | PRN

## 2020-03-25 MED ORDER — HEPARIN SODIUM (PORCINE) 5000 UNIT/ML IJ SOLN
5000.0000 [IU] | Freq: Three times a day (TID) | INTRAMUSCULAR | Status: DC
  Administered 2020-03-25 – 2020-03-30 (×14): 5000 [IU] via SUBCUTANEOUS
  Filled 2020-03-25 (×15): qty 1

## 2020-03-25 NOTE — Evaluation (Signed)
Physical Therapy Evaluation Patient Details Name: Breanna Noble MRN: [id number] DOB: 1961-03-07 Today's Date: 03/25/2020   History of Present Illness  pt is a 59 y/o female without significant medical hx, admitted with know pancreatic CA for management.  S.P Whipple procedure and pancreatic duct stenting.  Clinical Impression  Pt admitted with/for whipple procedure.  Pt with weakness L LE and needing min to mod assist for this and pain.  Expect quick recovery.  Pt currently limited functionally due to the problems listed below.  (see problems list.)  Pt will benefit from PT to maximize function and safety to be able to get home safely with available assist.     Follow Up Recommendations Home health PT;Supervision/Assistance - 24 hour;Supervision - Intermittent    Equipment Recommendations  3in1 (PT)    Recommendations for Other Services       Precautions / Restrictions Precautions Precautions: Fall      Mobility  Bed Mobility Overal bed mobility: Needs Assistance Bed Mobility: Rolling;Sidelying to Sit Rolling: Min guard (rail) Sidelying to sit: Min assist       General bed mobility comments: cues for direction and minimal assist overall    Transfers Overall transfer level: Needs assistance Equipment used: None;1 person hand held assist Transfers: Sit to/from Stand Sit to Stand: Min assist         General transfer comment: boost assist  Ambulation/Gait Ambulation/Gait assistance: Min assist;Mod assist Gait Distance (Feet): 15 Feet (x2 to/from BR) Assistive device: 1 person hand held assist Gait Pattern/deviations: Step-through pattern     General Gait Details: generally unsteady with soft knee buckling L LE  Stairs            Wheelchair Mobility    Modified Rankin (Stroke Patients Only)       Balance Overall balance assessment: Mild deficits observed, not formally tested                                           Pertinent  Vitals/Pain Pain Assessment: 0-10 Pain Score: 4  Pain Location: incisions and back Pain Descriptors / Indicators: Discomfort;Numbness Pain Intervention(s): Monitored during session;Limited activity within patient's tolerance    Home Living Family/patient expects to be discharged to:: Private residence Living Arrangements: Spouse/significant other Available Help at Discharge: Family;Available 24 hours/day Type of Home: House Home Access: Stairs to enter Entrance Stairs-Rails: None Entrance Stairs-Number of Steps: 3 Home Layout: One level Home Equipment: Walker - 2 wheels      Prior Function Level of Independence: Independent               Hand Dominance        Extremity/Trunk Assessment   Upper Extremity Assessment Upper Extremity Assessment: Overall WFL for tasks assessed    Lower Extremity Assessment Lower Extremity Assessment: LLE deficits/detail LLE Deficits / Details: hip flexor and quad weakness (suspect epidural) with mild soft bucckling. LLE Sensation: decreased light touch (numbness  in ant thight) LLE Coordination: decreased fine motor    Cervical / Trunk Assessment Cervical / Trunk Assessment: Normal  Communication   Communication: No difficulties  Cognition Arousal/Alertness: Awake/alert Behavior During Therapy: WFL for tasks assessed/performed Overall Cognitive Status: Within Functional Limits for tasks assessed  General Comments      Exercises     Assessment/Plan    PT Assessment Patient needs continued PT services  PT Problem List Decreased strength;Decreased mobility;Decreased coordination;Pain       PT Treatment Interventions Gait training;DME instruction;Stair training;Functional mobility training;Therapeutic activities;Patient/family education    PT Goals (Current goals can be found in the Care Plan section)  Acute Rehab PT Goals Patient Stated Goal: home as soon as  appropriate PT Goal Formulation: With patient Time For Goal Achievement: 04/08/20 Potential to Achieve Goals: Good    Frequency Min 3X/week   Barriers to discharge        Co-evaluation               AM-PAC PT "6 Clicks" Mobility  Outcome Measure Help needed turning from your back to your side while in a flat bed without using bedrails?: A Little Help needed moving from lying on your back to sitting on the side of a flat bed without using bedrails?: A Little Help needed moving to and from a bed to a chair (including a wheelchair)?: A Little Help needed standing up from a chair using your arms (e.g., wheelchair or bedside chair)?: A Little Help needed to walk in hospital room?: A Lot Help needed climbing 3-5 steps with a railing? : A Little 6 Click Score: 17    End of Session   Activity Tolerance: Patient tolerated treatment well;Patient limited by pain Patient left: in chair;with call bell/phone within reach;with family/visitor present Nurse Communication: Mobility status PT Visit Diagnosis: Unsteadiness on feet (R26.81);Pain Pain - part of body:  (back, incisions)    Time: 0383-3383 PT Time Calculation (min) (ACUTE ONLY): 42 min   Charges:   PT Evaluation $PT Eval Moderate Complexity: 1 Mod PT Treatments $Gait Training: 8-22 mins $Therapeutic Activity: 8-22 mins        03/25/2020  Ginger Carne., PT Acute Rehabilitation Services [phone]  (pager) [phone]  (office)  Tessie Fass Lubertha Leite 03/25/2020, 12:00 PM

## 2020-03-25 NOTE — Progress Notes (Signed)
Notified Dr Rosendo Gros of patient c/o 6/10 ABD. SBP 140s.  Epidural intact and infusing. Received order for 1x dose 0.5mg  dilaudid.

## 2020-03-25 NOTE — Progress Notes (Signed)
1 Day Post-Op from a Whipple procedure with appendectomy for pancreatic adenocarcinoma without operative complications.   Subjective/Chief Complaint: - Complains of uncontrolled pain this morning - Does endorse some nausea without vomiting - Has not ambulated post-operatively yet, did sit on side of bed   Objective: Vital signs in last 24 hours: Temp:  [96.3 F (35.7 C)-99.1 F (37.3 C)] 97.5 F (36.4 C) (11/24 0400) Pulse Rate:  [70-101] 94 (11/24 0600) Resp:  [17-42] 29 (11/24 0600) BP: (92-157)/(51-81) 132/76 (11/24 0600) SpO2:  [97 %-100 %] 99 % (11/24 0600) Arterial Line BP: (70-181)/(44-86) 153/70 (11/24 0600) Weight:  [63.4 kg] 63.4 kg (11/23 1450) Last BM Date:  (pta)  Intake/Output from previous day: 11/23 0701 - 11/24 0700 In: 5171.9 [I.V.:3363.5; NG/GT:20; IV Piggyback:1667.4] Out: 2160 [Urine:1080; Emesis/NG output:100; Drains:430; Blood:550] Intake/Output this shift: No intake/output data recorded.  General appearance: alert, cooperative, appears stated age and mild distress Nose: Nasogastric tube in place Resp: No increased work of breathing on room air Cardio: tachycardic, regular rhythm, no mrg GI: soft, appropriate post-operative tenderness, no guarding, abdominal drains x2 in right adbomen, incisional dressing in place with small area of staining, serosanguinous output in abdominal drain bags Extremities: extremities normal, atraumatic, no cyanosis or edema Pulses: 2+ and symmetric Neurologic: Grossly normal Incision/Wound: as above  Lab Results:  Recent Labs    03/24/20 1029 03/25/20 0543  WBC  --  24.4*  HGB 10.2* 10.2*  HCT 30.0* 31.7*  PLT  --  179   BMET Recent Labs    03/24/20 1029 03/25/20 0543  NA 138 134*  K 3.9 3.8  CL  --  101  CO2  --  21*  GLUCOSE  --  196*  BUN  --  8  CREATININE  --  0.60  CALCIUM  --  8.6*   PT/INR No results for input(s): LABPROT, INR in the last 72 hours. ABG Recent Labs    03/24/20 0828  03/24/20 1029  PHART 7.444 7.410  HCO3 29.1* 29.3*    Studies/Results: No results found.  Anti-infectives: Anti-infectives (From admission, onward)   Start     Dose/Rate Route Frequency Ordered Stop   03/24/20 0615  ceFAZolin (ANCEF) IVPB 2g/100 mL premix  Status:  Discontinued        2 g 200 mL/hr over 30 Minutes Intravenous On call to O.R. 03/24/20 0609 03/24/20 1439   03/24/20 0614  ceFAZolin (ANCEF) 2-4 GM/100ML-% IVPB       Note to Pharmacy: Henrine Screws   : cabinet override      03/24/20 0614 03/24/20 1829      Assessment/Plan: s/p Procedure(s): WHIPPLE PROCEDURE (N/A) LAPAROSCOPY DIAGNOSTIC (N/A) APPENDECTOMY (N/A)   - Continue epidural for pain control - Will add Fentanyl PCA given uncontrolled pain this morning - PRN Zofran/compazine for nausea - OOB/Ambulating today - Continue NPO with NGT today on LIWS - Keep abdominal drains - Heparin [place] for DVT ppx  - Daily labs with electrolyte replacement as indicated   LOS: 1 day    Sheria Lang, MD General Surgery  03/25/2020

## 2020-03-25 NOTE — Anesthesia Post-op Follow-up Note (Signed)
  Anesthesia Pain Follow-up Note  Patient: Breanna Noble  Day #: 1  Date of Follow-up: 03/25/2020 Time: 3:36 PM  Last Vitals:  Vitals:   03/25/20 1400 03/25/20 1500  BP: (!) 154/77 (!) 153/76  Pulse: 100 96  Resp: (!) 24 (!) 23  Temp:    SpO2: 100% 100%    Level of Consciousness: alert  Pain: mild, moderate   Side Effects:None  Catheter Site Exam:clean, dry, no drainage  Anti-Coag Meds (From admission, onward)   Start     Dose/Rate Route Frequency Ordered Stop   03/25/20 1400  heparin injection 5,000 Units        5,000 Units Subcutaneous Every 8 hours 03/25/20 0720      Epidural / Intrathecal (From admission, onward)   Start     Dose/Rate Route Frequency Ordered Stop   03/24/20 0700  ropivacaine (PF) 2 mg/mL (0.2%) (NAROPIN) injection        8 mL/hr 8 mL/hr  Epidural Continuous 03/24/20 0657 03/29/20 0659       Plan: Continue current therapy of postop epidural at surgeon's request  [place]

## 2020-03-25 NOTE — Anesthesia Postprocedure Evaluation (Signed)
Anesthesia Post Note  Patient: Breanna Noble  Procedure(s) Performed: WHIPPLE PROCEDURE (N/A Abdomen) LAPAROSCOPY DIAGNOSTIC (N/A Abdomen) APPENDECTOMY (N/A Abdomen)     Patient location during evaluation: PACU Anesthesia Type: General Level of consciousness: awake and alert Pain management: pain level controlled Vital Signs Assessment: post-procedure vital signs reviewed and stable Respiratory status: spontaneous breathing, nonlabored ventilation, respiratory function stable and patient connected to nasal cannula oxygen Cardiovascular status: blood pressure returned to baseline and stable Postop Assessment: no apparent nausea or vomiting Anesthetic complications: no   No complications documented.  Last Vitals:  Vitals:   03/25/20 1800 03/25/20 1900  BP: (!) 145/97 (!) 147/76  Pulse: (!) 103 89  Resp: (!) 33 (!) 28  Temp:    SpO2: 100% 100%    Last Pain:  Vitals:   03/25/20 1629  TempSrc:   PainSc: 4                  Daimien Patmon L Victoire Deans

## 2020-03-25 NOTE — Progress Notes (Signed)
Initial Nutrition Assessment  DOCUMENTATION CODES:   Non-severe (moderate) malnutrition in context of chronic illness  INTERVENTION:   Once diet advanced and pt is tolerating a PO diet recommend Ensure Enlive TID to support adequate nutrition post op.    NUTRITION DIAGNOSIS:   Moderate Malnutrition related to cancer and cancer related treatments as evidenced by moderate muscle depletion, mild muscle depletion, mild fat depletion.  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     Pt discussed during ICU rounds and with RN.   Pt reports a usual weight of 135 lb prior to dx. She began to lose weight prior to dx in 10/2019 and lost some while on chemo but felt she was able to regain some of her weight. She reports her lowest weight got to 129 lb (4% weight loss x 5 months). Per oncology RD notes pt's usual weight was 145 lb, pt is currently on PCA pump for pain and sleepy during assessment. Suspect 10% weight loss x 5 months.  Prior to dx pt was previously eating a vegetarian/whole foods diet since her husband has heart disease. Since dx she reports that her oncologist recommended her to eat more protein foods. She is now eating animal proteins but no red meat. She was followed by the outpatient oncology RD and was drinking chocolate Boost and Premier Protein to help aid in weight stabilization. At her last visit pt was 129.6 lb.    11/23 s/p Whipple, placement of pancreatic duct stent, and appendectomy  Medications reviewed and include: SSI Labs reviewed   NG tube in for suction Drain 1: 50 ml Drain 2: 380 ml   NUTRITION - FOCUSED PHYSICAL EXAM:    Most Recent Value  Orbital Region Mild depletion  Upper Arm Region Mild depletion   Thoracic and Lumbar Region No depletion  Buccal Region Mild depletion  Temple Region Moderate depletion  Clavicle Bone Region Mild depletion  Clavicle and Acromion Bone Region No depletion  Scapular Bone Region Unable to assess  Dorsal Hand Unable to assess  [due to devices]  Patellar Region No depletion  Anterior Thigh Region No depletion  Posterior Calf Region No depletion  Edema (RD Assessment) None  Hair Reviewed  Eyes Reviewed  Mouth Reviewed  Skin Reviewed  Nails Reviewed       Diet Order:   Diet Order            Diet NPO time specified  Diet effective now                 EDUCATION NEEDS:   Education needs have been addressed  Skin:  Skin Assessment: Reviewed RN Assessment  Last BM:  unknown  Height:   Ht Readings from Last 1 Encounters:  03/24/20 5\' 5"  (1.651 m)    Weight:   Wt Readings from Last 1 Encounters:  03/24/20 63.4 kg    Ideal Body Weight:  56.8 kg  BMI:  Body mass index is 23.26 kg/m.  Estimated Nutritional Needs:   Kcal:  1800-2000  Protein:  85-100 grams  Fluid:  > 1.8 L/day  Lockie Pares., RD, LDN, CNSC See AMiON for contact information

## 2020-03-26 DIAGNOSIS — E440 Moderate protein-calorie malnutrition: Secondary | ICD-10-CM | POA: Insufficient documentation

## 2020-03-26 LAB — COMPREHENSIVE METABOLIC PANEL
ALT: 68 U/L — ABNORMAL HIGH (ref 0–44)
AST: 61 U/L — ABNORMAL HIGH (ref 15–41)
Albumin: 2.7 g/dL — ABNORMAL LOW (ref 3.5–5.0)
Alkaline Phosphatase: 112 U/L (ref 38–126)
Anion gap: 8 (ref 5–15)
BUN: 7 mg/dL (ref 6–20)
CO2: 23 mmol/L (ref 22–32)
Calcium: 8.4 mg/dL — ABNORMAL LOW (ref 8.9–10.3)
Chloride: 101 mmol/L (ref 98–111)
Creatinine, Ser: 0.56 mg/dL (ref 0.44–1.00)
GFR, Estimated: >60 mL/min (ref >60)
Glucose, Bld: 129 mg/dL — ABNORMAL HIGH (ref 70–99)
Potassium: 4.4 mmol/L (ref 3.5–5.1)
Sodium: 132 mmol/L — ABNORMAL LOW (ref 135–145)
Total Bilirubin: 0.5 mg/dL (ref 0.3–1.2)
Total Protein: 5.9 g/dL — ABNORMAL LOW (ref 6.5–8.1)

## 2020-03-26 LAB — GLUCOSE, CAPILLARY
Glucose-Capillary: 109 mg/dL — ABNORMAL HIGH (ref 70–99)
Glucose-Capillary: 126 mg/dL — ABNORMAL HIGH (ref 70–99)
Glucose-Capillary: 118 mg/dL — ABNORMAL HIGH (ref 70–99)
Glucose-Capillary: 114 mg/dL — ABNORMAL HIGH (ref 70–99)
Glucose-Capillary: 111 mg/dL — ABNORMAL HIGH (ref 70–99)
Glucose-Capillary: 118 mg/dL — ABNORMAL HIGH (ref 70–99)

## 2020-03-26 LAB — CBC
HCT: 30.3 % — ABNORMAL LOW (ref 36.0–46.0)
Hemoglobin: 9.6 g/dL — ABNORMAL LOW (ref 12.0–15.0)
MCH: 30.4 pg (ref 26.0–34.0)
MCHC: 31.7 g/dL (ref 30.0–36.0)
MCV: 95.9 fL (ref 80.0–100.0)
Platelets: 174 10*3/uL (ref 150–400)
RBC: 3.16 MIL/uL — ABNORMAL LOW (ref 3.87–5.11)
RDW: 17.5 % — ABNORMAL HIGH (ref 11.5–15.5)
WBC: 24.3 10*3/uL — ABNORMAL HIGH (ref 4.0–10.5)
nRBC: 0 % (ref 0.0–0.2)

## 2020-03-26 LAB — MAGNESIUM: Magnesium: 1.8 mg/dL (ref 1.7–2.4)

## 2020-03-26 MED ORDER — KCL IN DEXTROSE-NACL 20-5-0.9 MEQ/L-%-% IV SOLN
INTRAVENOUS | Status: DC
  Filled 2020-03-26 (×3): qty 1000

## 2020-03-26 MED ORDER — POTASSIUM CHLORIDE 2 MEQ/ML IV SOLN: INTRAVENOUS | Status: DC

## 2020-03-26 NOTE — Progress Notes (Signed)
2 Days Post-Op   Subjective/Chief Complaint: Pain control pretty good NGT output moderate   Objective: Vital signs in last 24 hours: Temp:  [97.4 F (36.3 C)-98.6 F (37 C)] 98.6 F (37 C) (11/25 0400) Pulse Rate:  [83-113] 95 (11/25 0600) Resp:  [17-33] 20 (11/25 0600) BP: (131-157)/(67-97) 139/70 (11/25 0600) SpO2:  [97 %-100 %] 100 % (11/25 0600) Arterial Line BP: (111-162)/(72-98) 111/98 (11/24 1200) Last BM Date:  (pta)  Intake/Output from previous day: 11/24 0701 - 11/25 0700 In: 2735.8 [I.V.:1929.1; IV Piggyback:616.5] Out: 1800 [Urine:910; Emesis/NG output:300; Drains:590] Intake/Output this shift: No intake/output data recorded.  General appearance: cooperative Resp: clear to auscultation bilaterally Cardio: regular rate and rhythm GI: soft, incision dressing clean Extremities: calves soft  Lab Results:  Recent Labs    03/25/20 0543 03/26/20 0203  WBC 24.4* 24.3*  HGB 10.2* 9.6*  HCT 31.7* 30.3*  PLT 179 174   BMET Recent Labs    03/25/20 0543 03/26/20 0203  NA 134* 132*  K 3.8 4.4  CL 101 101  CO2 21* 23  GLUCOSE 196* 129*  BUN 8 7  CREATININE 0.60 0.56  CALCIUM 8.6* 8.4*   PT/INR No results for input(s): LABPROT, INR in the last 72 hours. ABG Recent Labs    03/24/20 1029 03/24/20 1235  PHART 7.410 7.399  HCO3 29.3* 26.9    Studies/Results: No results found.  Anti-infectives: Anti-infectives (From admission, onward)   Start     Dose/Rate Route Frequency Ordered Stop   03/24/20 0615  ceFAZolin (ANCEF) IVPB 2g/100 mL premix  Status:  Discontinued        2 g 200 mL/hr over 30 Minutes Intravenous On call to O.R. 03/24/20 0609 03/24/20 1439   03/24/20 0614  ceFAZolin (ANCEF) 2-4 GM/100ML-% IVPB       Note to Pharmacy: Henrine Screws   : cabinet override      03/24/20 0614 03/24/20 1829      Assessment/Plan: S/P Whipple 11/23  Continue drains D/C NGT Clears Has not needed PCA as much this AM Change IVF for low Na Epidural  per Anesthesia  LOS: 2 days    Zen[place]ia Jarred 03/26/2020

## 2020-03-26 NOTE — Anesthesia Post-op Follow-up Note (Signed)
  Anesthesia Pain Follow-up Note  Patient: Breanna Noble  Day #: 2  Date of Follow-up: 03/26/2020 Time: 7:43 PM  Last Vitals:  Vitals:   03/26/20 1800 03/26/20 1900  BP: (!) 146/78 (!) 150/76  Pulse: (!) 105 95  Resp: (!) 22 (!) 27  Temp:    SpO2: 99% 100%    Level of Consciousness: alert  Pain: mild   Side Effects:None  Catheter Site Exam:clean, dry  Anti-Coag Meds (From admission, onward)   Start     Dose/Rate Route Frequency Ordered Stop   03/25/20 1400  heparin injection 5,000 Units        5,000 Units Subcutaneous Every 8 hours 03/25/20 0720      Epidural / Intrathecal (From admission, onward)   Start     Dose/Rate Route Frequency Ordered Stop   03/24/20 0700  ropivacaine (PF) 2 mg/mL (0.2%) (NAROPIN) injection        8 mL/hr 8 mL/hr  Epidural Continuous 03/24/20 0657 03/29/20 0659       Plan: Continue current therapy of postop epidural at surgeon's request  Cheick Suhr

## 2020-03-26 NOTE — Addendum Note (Signed)
Addendum  created 03/26/20 1943 by Oleta Mouse, MD   Clinical Note Signed

## 2020-03-27 ENCOUNTER — Encounter (HOSPITAL_COMMUNITY): Payer: Self-pay | Admitting: Anesthesiology

## 2020-03-27 LAB — CBC
HCT: 41 % (ref 36.0–46.0)
Hemoglobin: 13 g/dL (ref 12.0–15.0)
MCH: 28.6 pg (ref 26.0–34.0)
MCHC: 31.7 g/dL (ref 30.0–36.0)
MCV: 90.1 fL (ref 80.0–100.0)
Platelets: 258 10*3/uL (ref 150–400)
RBC: 4.55 MIL/uL (ref 3.87–5.11)
RDW: 12.9 % (ref 11.5–15.5)
WBC: 14.9 10*3/uL — ABNORMAL HIGH (ref 4.0–10.5)
nRBC: 0 % (ref 0.0–0.2)

## 2020-03-27 LAB — COMPREHENSIVE METABOLIC PANEL
ALT: 19 U/L (ref 0–44)
AST: 30 U/L (ref 15–41)
Albumin: 3 g/dL — ABNORMAL LOW (ref 3.5–5.0)
Alkaline Phosphatase: 109 U/L (ref 38–126)
Anion gap: 11 (ref 5–15)
BUN: 49 mg/dL — ABNORMAL HIGH (ref 6–20)
CO2: 19 mmol/L — ABNORMAL LOW (ref 22–32)
Calcium: 9.9 mg/dL (ref 8.9–10.3)
Chloride: 107 mmol/L (ref 98–111)
Creatinine, Ser: 1.2 mg/dL — ABNORMAL HIGH (ref 0.44–1.00)
GFR, Estimated: 52 mL/min — ABNORMAL LOW (ref >60)
Glucose, Bld: 159 mg/dL — ABNORMAL HIGH (ref 70–99)
Potassium: 4.8 mmol/L (ref 3.5–5.1)
Sodium: 137 mmol/L (ref 135–145)
Total Bilirubin: 0.5 mg/dL (ref 0.3–1.2)
Total Protein: 7.2 g/dL (ref 6.5–8.1)

## 2020-03-27 LAB — GLUCOSE, CAPILLARY
Glucose-Capillary: 101 mg/dL — ABNORMAL HIGH (ref 70–99)
Glucose-Capillary: 104 mg/dL — ABNORMAL HIGH (ref 70–99)
Glucose-Capillary: 109 mg/dL — ABNORMAL HIGH (ref 70–99)
Glucose-Capillary: 109 mg/dL — ABNORMAL HIGH (ref 70–99)
Glucose-Capillary: 109 mg/dL — ABNORMAL HIGH (ref 70–99)
Glucose-Capillary: 109 mg/dL — ABNORMAL HIGH (ref 70–99)
Glucose-Capillary: 112 mg/dL — ABNORMAL HIGH (ref 70–99)

## 2020-03-27 LAB — MAGNESIUM: Magnesium: 2.3 mg/dL (ref 1.7–2.4)

## 2020-03-27 MED ORDER — DEXTROSE-NACL 5-0.9 % IV SOLN: INTRAVENOUS | Status: DC

## 2020-03-27 MED ORDER — OXYCODONE HCL 5 MG PO TABS
5.0000 mg | ORAL_TABLET | Freq: Four times a day (QID) | ORAL | Status: DC | PRN
  Administered 2020-03-29 – 2020-03-30 (×3): 5 mg via ORAL
  Filled 2020-03-27 (×3): qty 1

## 2020-03-27 MED ORDER — SODIUM CHLORIDE 0.9 % IV BOLUS
1000.0000 mL | Freq: Once | INTRAVENOUS | Status: AC
  Administered 2020-03-27: 1000 mL via INTRAVENOUS

## 2020-03-27 NOTE — Anesthesia Post-op Follow-up Note (Signed)
  Anesthesia Pain Follow-up Note  Patient: Breanna Noble  Day #: 3  Date of Follow-up: 03/27/2020 Time: 9:52 AM  Last Vitals:  Vitals:   03/27/20 0800 03/27/20 0900  BP: 130/74 132/68  Pulse: 93 94  Resp: (!) 25 16  Temp: 37.3 C   SpO2: 99% 100%    Level of Consciousness: alert  Pain: mild   Side Effects:None  Catheter Site Exam:clean, dry, no drainage  Anti-Coag Meds (From admission, onward)   Start     Dose/Rate Route Frequency Ordered Stop   03/25/20 1400  heparin injection 5,000 Units        5,000 Units Subcutaneous Every 8 hours 03/25/20 0720      Epidural / Intrathecal (From admission, onward)   Start     Dose/Rate Route Frequency Ordered Stop   03/24/20 0700  ropivacaine (PF) 2 mg/mL (0.2%) (NAROPIN) injection        8 mL/hr 8 mL/hr  Epidural Continuous 03/24/20 0657 03/29/20 0659       Plan: Continue current therapy of postop epidural at surgeon's request  [place] S

## 2020-03-27 NOTE — Progress Notes (Signed)
25 mL fentanyl wasted in sink w/ Beacher May RN

## 2020-03-27 NOTE — Progress Notes (Signed)
Progress Note: General Surgery Service   Chief Complaint/Subjective: Did not drink much yesterday, no lightheadedness, continued soreness, used PCA 1 time in last 24 h  Objective: Vital signs in last 24 hours: Temp:  [98.3 F (36.8 C)-99.1 F (37.3 C)] 99.1 F (37.3 C) (11/26 0400) Pulse Rate:  [77-111] 88 (11/26 0700) Resp:  [18-31] 24 (11/26 0700) BP: (134-152)/(68-83) 135/73 (11/26 0700) SpO2:  [97 %-100 %] 99 % (11/26 0700) Last BM Date:  (pta)  Intake/Output from previous day: 11/25 0701 - 11/26 0700 In: 2017.3 [I.V.:1817.3] Out: 1910 [Urine:1450; Drains:460] Intake/Output this shift: No intake/output data recorded.  Gen: NAD  Resp: nonlabored  Card: RRR  Abd: soft, ATTP, incision bandages, drains with serosanguinous output  Lab Results: CBC  Recent Labs    03/26/20 0203 03/27/20 0101  WBC 24.3* 14.9*  HGB 9.6* 13.0  HCT 30.3* 41.0  PLT 174 258   BMET Recent Labs    03/26/20 0203 03/27/20 0101  NA 132* 137  K 4.4 4.8  CL 101 107  CO2 23 19*  GLUCOSE 129* 159*  BUN 7 49*  CREATININE 0.56 1.20*  CALCIUM 8.4* 9.9   PT/INR No results for input(s): LABPROT, INR in the last 72 hours. ABG Recent Labs    03/24/20 1029 03/24/20 1235  PHART 7.410 7.399  HCO3 29.3* 26.9    Anti-infectives: Anti-infectives (From admission, onward)   Start     Dose/Rate Route Frequency Ordered Stop   03/24/20 0615  ceFAZolin (ANCEF) IVPB 2g/100 mL premix  Status:  Discontinued        2 g 200 mL/hr over 30 Minutes Intravenous On call to O.R. 03/24/20 0609 03/24/20 1439   03/24/20 0614  ceFAZolin (ANCEF) 2-4 GM/100ML-% IVPB       Note to Pharmacy: Henrine Screws   : cabinet override      03/24/20 0614 03/24/20 1829      Medications: Scheduled Meds: . Chlorhexidine Gluconate Cloth  6 each Topical Daily  . fentaNYL   Intravenous Q4H  . heparin injection (subcutaneous)  5,000 Units Subcutaneous Q8H  . insulin aspart  0-9 Units Subcutaneous Q4H  . levothyroxine   50 mcg Per Tube Q0600  . pantoprazole (PROTONIX) IV  40 mg Intravenous QHS   Continuous Infusions: . dextrose 5 % and 0.9 % NaCl with KCl 20 mEq/L 75 mL/hr at 03/27/20 0700  . ropivacaine (PF) 2 mg/mL (0.2%) 8 mL/hr (03/27/20 0700)  . sodium chloride     PRN Meds:.diphenhydrAMINE **OR** diphenhydrAMINE, LORazepam, naloxone **AND** sodium chloride flush, ondansetron **OR** ondansetron (ZOFRAN) IV, prochlorperazine **OR** prochlorperazine  Assessment/Plan: s/p Procedure(s): WHIPPLE PROCEDURE LAPAROSCOPY DIAGNOSTIC APPENDECTOMY 03/24/2020 -continue clear liquids -continue drain monitoring -ambulate -transfer to 4np  Epidural in place, pain in better control -dc pca, start oral pain medication  AKI, Cr 1.2 from 0.56, Hgb 9-->13 consistent with third spacing -1l bolus -continue to monitor   LOS: 3 days   Mickeal Skinner, MD [facility]

## 2020-03-27 NOTE — Progress Notes (Signed)
Physical Therapy Treatment Patient Details Name: Breanna Noble MRN: [id number] DOB: [birth year] Today's Date: 03/27/2020    History of Present Illness pt is a 59 y/o female without significant medical hx, admitted with know pancreatic CA for management.  S.P Whipple procedure and pancreatic duct stenting.    PT Comments    Progressing well toward goals.  Emphasis on sit to stand, progressing gait toward no AD and practice in/out of bed.    Follow Up Recommendations  Home health PT;Supervision/Assistance - 24 hour;Supervision - Intermittent     Equipment Recommendations  3in1 (PT)    Recommendations for Other Services       Precautions / Restrictions Precautions Precautions: Fall Restrictions Weight Bearing Restrictions: No    Mobility  Bed Mobility Overal bed mobility: Needs Assistance Bed Mobility: Rolling;Sidelying to Sit;Sit to Sidelying Rolling: Min guard Sidelying to sit: Min guard     Sit to sidelying: Min guard General bed mobility comments: practiced after cuing until at min guard level  Transfers Overall transfer level: Needs assistance   Transfers: Sit to/from Stand Sit to Stand: Min assist         General transfer comment: boost assist  Ambulation/Gait Ambulation/Gait assistance: Min guard Gait Distance (Feet): 360 Feet Assistive device: IV Pole;None Gait Pattern/deviations: Step-through pattern   Gait velocity interpretation: <1.8 ft/sec, indicate of risk for recurrent falls General Gait Details: short generally steady, but tentative steps.   Stairs             Wheelchair Mobility    Modified Rankin (Stroke Patients Only)       Balance Overall balance assessment: Needs assistance Sitting-balance support: No upper extremity supported Sitting balance-Leahy Scale: Fair       Standing balance-Leahy Scale: Fair                              Cognition Arousal/Alertness: Awake/alert Behavior During Therapy: WFL for  tasks assessed/performed Overall Cognitive Status: Within Functional Limits for tasks assessed                                        Exercises      General Comments General comments (skin integrity, edema, etc.): HR around 100 bpm overall, SpO2 100%      Pertinent Vitals/Pain Pain Assessment: 0-10 Pain Score: 3  Pain Location: incisions Pain Descriptors / Indicators: Discomfort;Numbness Pain Intervention(s): Monitored during session    Home Living                      Prior Function            PT Goals (current goals can now be found in the care plan section) Acute Rehab PT Goals PT Goal Formulation: With patient Time For Goal Achievement: 04/08/20 Potential to Achieve Goals: Good Progress towards PT goals: Progressing toward goals    Frequency    Min 3X/week      PT Plan Current plan remains appropriate    Co-evaluation              AM-PAC PT "6 Clicks" Mobility   Outcome Measure  Help needed turning from your back to your side while in a flat bed without using bedrails?: A Little Help needed moving from lying on your back to sitting on the side of a flat bed without using  bedrails?: A Little Help needed moving to and from a bed to a chair (including a wheelchair)?: A Little Help needed standing up from a chair using your arms (e.g., wheelchair or bedside chair)?: A Little Help needed to walk in hospital room?: A Little Help needed climbing 3-5 steps with a railing? : A Little 6 Click Score: 18    End of Session   Activity Tolerance: Patient tolerated treatment well;Other (comment) (minor pain) Patient left: in bed;with call bell/phone within reach;with family/visitor present Nurse Communication: Mobility status PT Visit Diagnosis: Unsteadiness on feet (R26.81);Pain Pain - part of body:  (abdominal incisions)     Time: 4656-8127 PT Time Calculation (min) (ACUTE ONLY): 25 min  Charges:  $Gait Training: 8-22  mins $Therapeutic Activity: 8-22 mins                     03/27/2020  Ginger Carne., PT Acute Rehabilitation Services [phone]  (pager) [phone]  (office)   [name] 03/27/2020, 11:51 AM

## 2020-03-28 LAB — COMPREHENSIVE METABOLIC PANEL
ALT: 36 U/L (ref 0–44)
AST: 27 U/L (ref 15–41)
Albumin: 2 g/dL — ABNORMAL LOW (ref 3.5–5.0)
Alkaline Phosphatase: 92 U/L (ref 38–126)
Anion gap: 8 (ref 5–15)
BUN: 5 mg/dL — ABNORMAL LOW (ref 6–20)
CO2: 23 mmol/L (ref 22–32)
Calcium: 7.6 mg/dL — ABNORMAL LOW (ref 8.9–10.3)
Chloride: 105 mmol/L (ref 98–111)
Creatinine, Ser: 0.45 mg/dL (ref 0.44–1.00)
GFR, Estimated: >60 mL/min (ref >60)
Glucose, Bld: 118 mg/dL — ABNORMAL HIGH (ref 70–99)
Potassium: 3.2 mmol/L — ABNORMAL LOW (ref 3.5–5.1)
Sodium: 136 mmol/L (ref 135–145)
Total Bilirubin: 0.4 mg/dL (ref 0.3–1.2)
Total Protein: 5.1 g/dL — ABNORMAL LOW (ref 6.5–8.1)

## 2020-03-28 LAB — GLUCOSE, CAPILLARY
Glucose-Capillary: 104 mg/dL — ABNORMAL HIGH (ref 70–99)
Glucose-Capillary: 133 mg/dL — ABNORMAL HIGH (ref 70–99)
Glucose-Capillary: 86 mg/dL (ref 70–99)
Glucose-Capillary: 86 mg/dL (ref 70–99)
Glucose-Capillary: 102 mg/dL — ABNORMAL HIGH (ref 70–99)

## 2020-03-28 LAB — CBC
HCT: 23.8 % — ABNORMAL LOW (ref 36.0–46.0)
Hemoglobin: 7.5 g/dL — ABNORMAL LOW (ref 12.0–15.0)
MCH: 30.2 pg (ref 26.0–34.0)
MCHC: 31.5 g/dL (ref 30.0–36.0)
MCV: 96 fL (ref 80.0–100.0)
Platelets: 131 10*3/uL — ABNORMAL LOW (ref 150–400)
RBC: 2.48 MIL/uL — ABNORMAL LOW (ref 3.87–5.11)
RDW: 16.8 % — ABNORMAL HIGH (ref 11.5–15.5)
WBC: 5.9 10*3/uL (ref 4.0–10.5)
nRBC: 0 % (ref 0.0–0.2)

## 2020-03-28 LAB — MAGNESIUM: Magnesium: 1.8 mg/dL (ref 1.7–2.4)

## 2020-03-28 MED ORDER — POTASSIUM CHLORIDE 10 MEQ/100ML IV SOLN
10.0000 meq | INTRAVENOUS | Status: DC
  Filled 2020-03-28 (×2): qty 100

## 2020-03-28 MED ORDER — SODIUM CHLORIDE 0.9% FLUSH: 10.0000 mL | INTRAVENOUS | Status: DC | PRN

## 2020-03-28 MED ORDER — CHLORHEXIDINE GLUCONATE CLOTH 2 % EX PADS
6.0000 | MEDICATED_PAD | Freq: Every day | CUTANEOUS | Status: DC
  Administered 2020-03-30: 6 via TOPICAL

## 2020-03-28 MED ORDER — LEVOTHYROXINE SODIUM 50 MCG PO TABS
50.0000 ug | ORAL_TABLET | Freq: Every day | ORAL | Status: DC
  Administered 2020-03-29 – 2020-03-30 (×2): 50 ug via ORAL
  Filled 2020-03-28 (×2): qty 1

## 2020-03-28 MED ORDER — POTASSIUM CHLORIDE 20 MEQ PO PACK
40.0000 meq | PACK | Freq: Once | ORAL | Status: AC
  Administered 2020-03-28: 40 meq via ORAL
  Filled 2020-03-28: qty 2

## 2020-03-28 MED ORDER — PANTOPRAZOLE SODIUM 40 MG PO TBEC
40.0000 mg | DELAYED_RELEASE_TABLET | Freq: Every day | ORAL | Status: DC
  Administered 2020-03-28 – 2020-03-29 (×2): 40 mg via ORAL
  Filled 2020-03-28 (×2): qty 1

## 2020-03-28 NOTE — Evaluation (Signed)
Occupational Therapy Evaluation Patient Details Name: Breanna Noble MRN: [id number] DOB: [birth year] Today's Date: 03/28/2020    History of Present Illness pt is a 59 y/o female without significant medical hx, admitted with know pancreatic CA for management.  S.P Whipple procedure and pancreatic duct stenting.   Clinical Impression   Patient evaluated by Occupational Therapy with no further acute OT needs identified. All education has been completed and the patient has no further questions. Pt is moving well, and able to perform ADLs at mod I level  (limited by lines).  She demonstrates good safety awareness.  See below for any follow-up Occupational Therapy or equipment needs. OT is signing off. Thank you for this referral.      Follow Up Recommendations  No OT follow up;Supervision - Intermittent    Equipment Recommendations  None recommended by OT    Recommendations for Other Services       Precautions / Restrictions Precautions Precautions: Fall      Mobility Bed Mobility Overal bed mobility: Modified Independent                  Transfers Overall transfer level: Modified independent                    Balance Overall balance assessment: No apparent balance deficits (not formally assessed)                                         ADL either performed or assessed with clinical judgement   ADL Overall ADL's : Modified independent                                       General ADL Comments: Pt limited by IV lines in Lt hand      Vision Patient Visual Report: No change from baseline       Perception     Praxis      Pertinent Vitals/Pain Pain Assessment: No/denies pain     Hand Dominance Right   Extremity/Trunk Assessment Upper Extremity Assessment Upper Extremity Assessment: Overall WFL for tasks assessed   Lower Extremity Assessment Lower Extremity Assessment: Defer to PT evaluation        Communication Communication Communication: No difficulties   Cognition Arousal/Alertness: Awake/alert Behavior During Therapy: WFL for tasks assessed/performed Overall Cognitive Status: Within Functional Limits for tasks assessed                                     General Comments  HR 99-124    Exercises     Shoulder Instructions      Home Living Family/patient expects to be discharged to:: Private residence Living Arrangements: Spouse/significant other Available Help at Discharge: Family;Available 24 hours/day Type of Home: House Home Access: Stairs to enter [employer] of Steps: 3 Entrance Stairs-Rails: None Home Layout: One level     Bathroom Shower/Tub: Teacher, early years/pre: Standard Bathroom Accessibility: Yes   Home Equipment: Walker - 2 wheels          Prior Functioning/Environment Level of Independence: Independent        Comments: watches her 2 y.o. grandson and hopes to be able to return to that  OT Problem List: Decreased activity tolerance      OT Treatment/Interventions:      OT Goals(Current goals can be found in the care plan section) Acute Rehab OT Goals Patient Stated Goal: return to watching her grandson  OT Goal Formulation: All assessment and education complete, DC therapy  OT Frequency:     Barriers to D/C:            Co-evaluation              AM-PAC OT "6 Clicks" Daily Activity     Outcome Measure Help from another person eating meals?: None Help from another person taking care of personal grooming?: None Help from another person toileting, which includes using toliet, bedpan, or urinal?: None Help from another person bathing (including washing, rinsing, drying)?: None Help from another person to put on and taking off regular upper body clothing?: None Help from another person to put on and taking off regular lower body clothing?: None 6 Click Score: 24   End of  Session Nurse Communication: Mobility status  Activity Tolerance: Patient tolerated treatment well Patient left: in chair;with call bell/phone within reach;with family/visitor present  OT Visit Diagnosis: Pain Pain - part of body:  (abdomen )                Time: 8421-0312 OT Time Calculation (min): 21 min Charges:  OT General Charges $OT Visit: 1 Visit OT Evaluation $OT Eval Moderate Complexity: 1 Mod  Nilsa Nutting., OTR/L Acute Rehabilitation Services Pager [phone] Office [phone]   Lucille Passy M 03/28/2020, 4:28 PM

## 2020-03-28 NOTE — Anesthesia Post-op Follow-up Note (Signed)
  Anesthesia Pain Follow-up Note  Patient: Breanna Noble  Day #: 4  Date of Follow-up: 03/28/2020 Time: 8:19 PM  Last Vitals:  Vitals:   03/28/20 1716 03/28/20 2010  BP: 121/65 124/61  Pulse:  92  Resp: (!) 31 (!) 25  Temp:  37.1 C  SpO2:  100%    Level of Consciousness: alert  Pain: mild   Side Effects:None  Catheter Site Exam:clean, dry  Anti-Coag Meds (From admission, onward)   Start     Dose/Rate Route Frequency Ordered Stop   03/25/20 1400  heparin injection 5,000 Units        5,000 Units Subcutaneous Every 8 hours 03/25/20 0720      Epidural / Intrathecal (From admission, onward)   Start     Dose/Rate Route Frequency Ordered Stop   03/24/20 0700  ropivacaine (PF) 2 mg/mL (0.2%) (NAROPIN) injection        8 mL/hr 8 mL/hr  Epidural Continuous 03/24/20 0657 03/29/20 0659       Plan: Continue current therapy of postop epidural at surgeon's request  Samoria Fedorko

## 2020-03-28 NOTE — Progress Notes (Addendum)
Patient ID: Breanna Noble, female   DOB: [birth year], 59 y.o.   MRN: [id number] 4 Days Post-Op   Subjective: Had several BMs ROS negative except as listed above. Objective: Vital signs in last 24 hours: Temp:  [97.4 F (36.3 C)-98.9 F (37.2 C)] 98.1 F (36.7 C) (11/27 0700) Pulse Rate:  [84-103] 96 (11/27 0340) Resp:  [17-29] 27 (11/27 0800) BP: (125-138)/(54-79) 125/70 (11/27 0800) SpO2:  [97 %-100 %] 97 % (11/27 0340) Last BM Date: 03/27/20  Intake/Output from previous day: 11/26 0701 - 11/27 0700 In: 2540.7 [P.O.:420; I.V.:968; IV Piggyback:1008.7] Out: 2500 [Urine:925; Drains:1575] Intake/Output this shift: No intake/output data recorded.  General appearance: cooperative Resp: clear to auscultation bilaterally Cardio: regular rate and rhythm GI: soft, incision CDI, drains SS and leaking around sites - dressing changed Extremities: no edema  Lab Results: CBC  Recent Labs    03/27/20 0101 03/28/20 0221  WBC 14.9* 5.9  HGB 13.0 7.5*  HCT 41.0 23.8*  PLT 258 131*   BMET Recent Labs    03/27/20 0101 03/28/20 0221  NA 137 136  K 4.8 3.2*  CL 107 105  CO2 19* 23  GLUCOSE 159* 118*  BUN 49* 5*  CREATININE 1.20* 0.45  CALCIUM 9.9 7.6*   PT/INR No results for input(s): LABPROT, INR in the last 72 hours. ABG No results for input(s): PHART, HCO3 in the last 72 hours.  Invalid input(s): PCO2, PO2  Studies/Results: No results found.  Anti-infectives: Anti-infectives (From admission, onward)   Start     Dose/Rate Route Frequency Ordered Stop   03/24/20 0615  ceFAZolin (ANCEF) IVPB 2g/100 mL premix  Status:  Discontinued        2 g 200 mL/hr over 30 Minutes Intravenous On call to O.R. 03/24/20 0609 03/24/20 1439   03/24/20 0614  ceFAZolin (ANCEF) 2-4 GM/100ML-% IVPB       Note to Pharmacy: Henrine Screws   : cabinet override      03/24/20 0614 03/24/20 1829      Assessment/Plan: s/p Procedure(s): WHIPPLE PROCEDURE LAPAROSCOPY DIAGNOSTIC APPENDECTOMY  03/24/2020 -fulls -continue drain monitoring, bili WNL -ambulate -replete hypokalemia  Epidural in place, pain in better control -oral pain medication  AKI, Cr 1.2 from 0.56, Hgb 9-->13 consistent with third spacing -Cr 0.45 today, continue IVF at 75 as high drain output, labs in AM -D/C foley  VTE -SQ heparin   LOS: 4 days    Georganna Skeans, MD, MPH, FACS Trauma & General Surgery Use [email or web address] to contact on call provider  03/28/2020

## 2020-03-29 LAB — CBC
HCT: 25.3 % — ABNORMAL LOW (ref 36.0–46.0)
Hemoglobin: 7.9 g/dL — ABNORMAL LOW (ref 12.0–15.0)
MCH: 29.8 pg (ref 26.0–34.0)
MCHC: 31.2 g/dL (ref 30.0–36.0)
MCV: 95.5 fL (ref 80.0–100.0)
Platelets: 170 10*3/uL (ref 150–400)
RBC: 2.65 MIL/uL — ABNORMAL LOW (ref 3.87–5.11)
RDW: 16.6 % — ABNORMAL HIGH (ref 11.5–15.5)
WBC: 6.3 10*3/uL (ref 4.0–10.5)
nRBC: 0 % (ref 0.0–0.2)

## 2020-03-29 LAB — GLUCOSE, CAPILLARY
Glucose-Capillary: 105 mg/dL — ABNORMAL HIGH (ref 70–99)
Glucose-Capillary: 108 mg/dL — ABNORMAL HIGH (ref 70–99)
Glucose-Capillary: 137 mg/dL — ABNORMAL HIGH (ref 70–99)
Glucose-Capillary: 121 mg/dL — ABNORMAL HIGH (ref 70–99)
Glucose-Capillary: 103 mg/dL — ABNORMAL HIGH (ref 70–99)
Glucose-Capillary: 122 mg/dL — ABNORMAL HIGH (ref 70–99)

## 2020-03-29 LAB — COMPREHENSIVE METABOLIC PANEL
ALT: 30 U/L (ref 0–44)
AST: 26 U/L (ref 15–41)
Albumin: 1.9 g/dL — ABNORMAL LOW (ref 3.5–5.0)
Alkaline Phosphatase: 110 U/L (ref 38–126)
Anion gap: 8 (ref 5–15)
BUN: <5 mg/dL — ABNORMAL LOW (ref 6–20)
CO2: 23 mmol/L (ref 22–32)
Calcium: 7.6 mg/dL — ABNORMAL LOW (ref 8.9–10.3)
Chloride: 103 mmol/L (ref 98–111)
Creatinine, Ser: 0.59 mg/dL (ref 0.44–1.00)
GFR, Estimated: >60 mL/min (ref >60)
Glucose, Bld: 153 mg/dL — ABNORMAL HIGH (ref 70–99)
Potassium: 3.1 mmol/L — ABNORMAL LOW (ref 3.5–5.1)
Sodium: 134 mmol/L — ABNORMAL LOW (ref 135–145)
Total Bilirubin: 0.3 mg/dL (ref 0.3–1.2)
Total Protein: 5.1 g/dL — ABNORMAL LOW (ref 6.5–8.1)

## 2020-03-29 LAB — MAGNESIUM: Magnesium: 1.8 mg/dL (ref 1.7–2.4)

## 2020-03-29 MED ORDER — BOOST / RESOURCE BREEZE PO LIQD CUSTOM
1.0000 | Freq: Three times a day (TID) | ORAL | Status: DC
  Administered 2020-03-29 – 2020-03-30 (×3): 1 via ORAL

## 2020-03-29 MED ORDER — MAGNESIUM SULFATE 2 GM/50ML IV SOLN
2.0000 g | Freq: Once | INTRAVENOUS | Status: AC
  Administered 2020-03-29: 2 g via INTRAVENOUS
  Filled 2020-03-29: qty 50

## 2020-03-29 MED ORDER — POTASSIUM CHLORIDE 20 MEQ PO PACK
40.0000 meq | PACK | Freq: Three times a day (TID) | ORAL | Status: AC
  Administered 2020-03-29 (×3): 40 meq via ORAL
  Filled 2020-03-29 (×3): qty 2

## 2020-03-29 MED ORDER — HYDROMORPHONE HCL 1 MG/ML IJ SOLN: 0.5000 mg | INTRAMUSCULAR | Status: DC | PRN

## 2020-03-29 NOTE — Anesthesia Post-op Follow-up Note (Signed)
  Anesthesia Pain Follow-up Note  Patient: Breanna Noble  Day #: 5  Date of Follow-up: 03/29/2020 Time: 4:15 PM  Last Vitals:  Vitals:   03/29/20 0842 03/29/20 1554  BP: 112/63 (!) 122/58  Pulse: 98 95  Resp: 20 20  Temp: 36.8 C 37.1 C  SpO2: 100% 100%    Level of Consciousness: alert, sitting up in chair, conversant  Pain: 2 /10   Side Effects:None, presently, but pt has had intermittent tingling in R leg, no weakness  Catheter Site Exam:clean, dry, no drainage  Anti-Coag Meds (From admission, onward)   Start     Dose/Rate Route Frequency Ordered Stop   03/25/20 1400  heparin injection 5,000 Units        5,000 Units Subcutaneous Every 8 hours 03/25/20 0720         Plan: Catheter removed/tip intact at surgeon's request and D/C Infusion at surgeon's request  Klare Criss,E. Derrius Furtick

## 2020-03-29 NOTE — Progress Notes (Signed)
Ropivacaine via epidural continues infusing @ 8 with container nearly empied. Spoke with Pharmacist on two occasions regarding medication being discontinued Paged [facility] and [employer], [place] returned call. Reports  Epidural will be removed today as states in Dr. Loletha Grayer. Connor's  Notes. Floor Pharmacist Opal Sidles updated. Breanna Noble, BSN, RN

## 2020-03-29 NOTE — Progress Notes (Signed)
Patient ID: Breanna Noble, female   DOB: [birth year], 59 y.o.   MRN: [id number] 5 Days Post-Op   Subjective: Feeling well, pain well controlled, tolerating liquids without n/v, having bowel movements.  ROS negative except as listed above. Objective: Vital signs in last 24 hours: Temp:  [97.1 F (36.2 C)-98.8 F (37.1 C)] 98.3 F (36.8 C) (11/28 0842) Pulse Rate:  [83-98] 98 (11/28 0842) Resp:  [16-31] 20 (11/28 0842) BP: (108-124)/(59-69) 112/63 (11/28 0842) SpO2:  [98 %-100 %] 100 % (11/28 0842) Last BM Date: 03/28/20  Intake/Output from previous day: 11/27 0701 - 11/28 0700 In: 2385 [P.O.:1560; I.V.:825] Out: 1250 [Urine:275; Drains:975] Intake/Output this shift: Total I/O In: 240 [P.O.:240] Out: 575 [Drains:575]  General appearance: cooperative Resp: clear to auscultation bilaterally Cardio: regular rate and rhythm GI: soft, incision CDI, drains serous Extremities: no edema  Lab Results: CBC  Recent Labs    03/28/20 0221 03/29/20 0500  WBC 5.9 6.3  HGB 7.5* 7.9*  HCT 23.8* 25.3*  PLT 131* 170   BMET Recent Labs    03/28/20 0221 03/29/20 0500  NA 136 134*  K 3.2* 3.1*  CL 105 103  CO2 23 23  GLUCOSE 118* 153*  BUN 5* <5*  CREATININE 0.45 0.59  CALCIUM 7.6* 7.6*   PT/INR No results for input(s): LABPROT, INR in the last 72 hours. ABG No results for input(s): PHART, HCO3 in the last 72 hours.  Invalid input(s): PCO2, PO2  Studies/Results: No results found.  Anti-infectives: Anti-infectives (From admission, onward)   Start     Dose/Rate Route Frequency Ordered Stop   03/24/20 0615  ceFAZolin (ANCEF) IVPB 2g/100 mL premix  Status:  Discontinued        2 g 200 mL/hr over 30 Minutes Intravenous On call to O.R. 03/24/20 0609 03/24/20 1439   03/24/20 0614  ceFAZolin (ANCEF) 2-4 GM/100ML-% IVPB       Note to Pharmacy: Henrine Screws   : cabinet override      03/24/20 0614 03/24/20 1829      Assessment/Plan: s/p Procedure(s): WHIPPLE  PROCEDURE LAPAROSCOPY DIAGNOSTIC APPENDECTOMY 03/24/2020 -soft diet today -continue drain monitoring, bili WNL, output high but going down -ambulate -replete hypokalemia and magnesium  Epidural in place, pain in better control -oral pain medication -Epidural will be removed today  AKI, Cr 1.2 from 0.56, Hgb 9-->13-->7.5 consistent with third spacing. Hgb stable today -Cr 0.59 today, stop IVF   VTE -SQ heparin   LOS: 5 days    [facility] Use [email or web address] to contact on call provider  03/29/2020

## 2020-03-30 LAB — COMPREHENSIVE METABOLIC PANEL
ALT: 26 U/L (ref 0–44)
AST: 20 U/L (ref 15–41)
Albumin: 1.9 g/dL — ABNORMAL LOW (ref 3.5–5.0)
Alkaline Phosphatase: 107 U/L (ref 38–126)
Anion gap: 8 (ref 5–15)
BUN: <5 mg/dL — ABNORMAL LOW (ref 6–20)
CO2: 25 mmol/L (ref 22–32)
Calcium: 7.7 mg/dL — ABNORMAL LOW (ref 8.9–10.3)
Chloride: 101 mmol/L (ref 98–111)
Creatinine, Ser: 0.36 mg/dL — ABNORMAL LOW (ref 0.44–1.00)
GFR, Estimated: >60 mL/min (ref >60)
Glucose, Bld: 116 mg/dL — ABNORMAL HIGH (ref 70–99)
Potassium: 3.9 mmol/L (ref 3.5–5.1)
Sodium: 134 mmol/L — ABNORMAL LOW (ref 135–145)
Total Bilirubin: 0.3 mg/dL (ref 0.3–1.2)
Total Protein: 4.9 g/dL — ABNORMAL LOW (ref 6.5–8.1)

## 2020-03-30 LAB — URINALYSIS, ROUTINE W REFLEX MICROSCOPIC
Bilirubin Urine: NEGATIVE
Glucose, UA: NEGATIVE mg/dL
Hgb urine dipstick: NEGATIVE
Ketones, ur: NEGATIVE mg/dL
Leukocytes,Ua: NEGATIVE
Nitrite: NEGATIVE
Protein, ur: NEGATIVE mg/dL
Specific Gravity, Urine: 1.01 (ref 1.005–1.030)
pH: 6 (ref 5.0–8.0)

## 2020-03-30 LAB — CBC
HCT: 25 % — ABNORMAL LOW (ref 36.0–46.0)
Hemoglobin: 7.8 g/dL — ABNORMAL LOW (ref 12.0–15.0)
MCH: 30.2 pg (ref 26.0–34.0)
MCHC: 31.2 g/dL (ref 30.0–36.0)
MCV: 96.9 fL (ref 80.0–100.0)
Platelets: 148 10*3/uL — ABNORMAL LOW (ref 150–400)
RBC: 2.58 MIL/uL — ABNORMAL LOW (ref 3.87–5.11)
RDW: 16.3 % — ABNORMAL HIGH (ref 11.5–15.5)
WBC: 6.1 10*3/uL (ref 4.0–10.5)
nRBC: 0 % (ref 0.0–0.2)

## 2020-03-30 LAB — GLUCOSE, CAPILLARY
Glucose-Capillary: 121 mg/dL — ABNORMAL HIGH (ref 70–99)
Glucose-Capillary: 125 mg/dL — ABNORMAL HIGH (ref 70–99)
Glucose-Capillary: 129 mg/dL — ABNORMAL HIGH (ref 70–99)
Glucose-Capillary: 144 mg/dL — ABNORMAL HIGH (ref 70–99)

## 2020-03-30 LAB — MAGNESIUM: Magnesium: 2 mg/dL (ref 1.7–2.4)

## 2020-03-30 MED ORDER — OXYCODONE HCL 5 MG PO TABS: 5.0000 mg | ORAL_TABLET | Freq: Four times a day (QID) | ORAL | 0 refills | Status: DC | PRN

## 2020-03-30 MED ORDER — PANTOPRAZOLE SODIUM 40 MG PO TBEC
40.0000 mg | DELAYED_RELEASE_TABLET | Freq: Every day | ORAL | 1 refills | Status: DC
Start: 2020-03-30 — End: 2020-08-21

## 2020-03-30 NOTE — Discharge Summary (Signed)
Physician Discharge Summary  Patient ID: Breanna Noble MRN: [id number] DOB/AGE: Jan 07, 1961 59 y.o.  Admit date: 03/24/2020 Discharge date: 03/30/2020  Admission Diagnoses: Pancreatic Adenocarcinoma   Discharge Diagnoses:  Active Problems:   Pancreatic cancer (HCC)   Pancreatic adenocarcinoma ([place])   Malnutrition of moderate degree   Discharged Condition: good  Hospital Course: The patient presented for her scheduled pancreaticoduodenectomy for adenocarcinoma of the head of the pancreas, which she tolerated well without immediate complications. Her pain was initially managed with an epidural/PCA and she had a nasogastric tube in place. Her nasogastric tube and Foley catheter were removed on POD#2 and she was progressed to a clear liquid diet. She was ambulating with the help of physical therapy who eventually recommended home as a discharge destination with 24-hour assistance which will be provided by her husband. On POD#5 she was advanced to a soft diet and her epidural pain catheter was removed. She was fully transitioned to oral pain medications. Her abdominal drains continued to put out high volume serosanguinous fluid, so the decision was made to teach the patient drain care and to discharge her with both drains remaining in place. On POD#6, she was voiding spontaneously, having bowel movement, tolerating a soft diet without nausea or vomiting, her pain was controlled on oral pain medications, and she was able to ambulate with appropriate assistance, thus she was deemed appropriate for discharge to home.   Consults: Anesthesiology: Pain   Significant Diagnostic Studies: labs: Daily CBC and chemistry  Treatments: IV hydration, antibiotics: Ancef, analgesia: acetaminophen, Dilaudid and Fentanyl, anticoagulation: heparin, insulin: Lantus and surgery: Pancreaticoduodenectomy  Discharge Exam: Blood pressure 129/70, pulse (!) 101, temperature 98.4 F (36.9 C), resp. rate 20, height 5\' 5"   (1.651 m), weight 63.4 kg, last menstrual period 11/22/2013, SpO2 99 %. General appearance: alert, cooperative, appears stated age and mild distress Resp: No increased work of breathing on room air Cardio: regular rate, regular rhythm, no mrg GI: soft, appropriate post-operative tenderness, no guarding, abdominal drains x2 in right adbomen, incision c/d/i with staples in place, LUQ incision c/d/i Extremities: extremities normal, atraumatic, no cyanosis or edema Pulses: 2+ and symmetric Neurologic: Grossly normal Incision/Wound: as above  Disposition: Discharge disposition: 01-Home or Self Care       Discharge Instructions    Call MD for:  difficulty breathing, headache or visual disturbances   Complete by: As directed    Call MD for:  hives   Complete by: As directed    Call MD for:  persistant nausea and vomiting   Complete by: As directed    Call MD for:  redness, tenderness, or signs of infection (pain, swelling, redness, odor or green/yellow discharge around incision site)   Complete by: As directed    Call MD for:  severe uncontrolled pain   Complete by: As directed    Call MD for:  temperature >100.4   Complete by: As directed    Change dressing (specify)   Complete by: As directed    Measure and record drain output at least twice daily.    Keep drain #1 and 2 separate.   Change gauze around drains as needed to keep skin dry.  If skin is irritated, apply desitin or other zinc oxide around drain site.   Diet - low sodium heart healthy   Complete by: As directed    Increase activity slowly   Complete by: As directed      Allergies as of 03/30/2020   No Known Allergies  Medication List    TAKE these medications   B-COMPLEX/B-12 PO Take by mouth.   CALCIUM 1000 + D PO Take 1 tablet by mouth daily.   diphenhydrAMINE 25 MG tablet Commonly known as: BENADRYL Take 25 mg by mouth at bedtime.   ferrous sulfate 325 (65 FE) MG tablet Take 325 mg by mouth  daily with breakfast.   ibuprofen 200 MG tablet Commonly known as: ADVIL Take 400 mg by mouth every 6 (six) hours as needed for moderate pain.   levothyroxine 50 MCG tablet Commonly known as: SYNTHROID Take 1 tablet (50 mcg total) by mouth daily.   lidocaine-prilocaine cream Commonly known as: EMLA Apply to affected area once What changed:   how much to take  how to take this  when to take this  reasons to take this  additional instructions   LORazepam 0.5 MG tablet Commonly known as: ATIVAN Take 1 tablet (0.5 mg total) by mouth daily as needed for anxiety. For anticipatory n/v prior to chemo treatments   MULTIVITAMIN PO Take 1 tablet by mouth daily.   mupirocin ointment 2 % Commonly known as: BACTROBAN Place 1 application into the nose daily as needed (sore in nose).   ondansetron 4 MG tablet Commonly known as: ZOFRAN Take 4 mg by mouth every 8 (eight) hours as needed.   ondansetron 8 MG tablet Commonly known as: Zofran Take 1 tablet (8 mg total) by mouth 2 (two) times daily as needed. Start on day 3 after chemotherapy.   oxyCODONE 5 MG immediate release tablet Commonly known as: Oxy IR/ROXICODONE Take 1 tablet (5 mg total) by mouth every 6 (six) hours as needed for moderate pain.   pantoprazole 40 MG tablet Commonly known as: PROTONIX Take 1 tablet (40 mg total) by mouth daily with supper.   prochlorperazine 10 MG tablet Commonly known as: COMPAZINE TAKE 1 TABLET BY MOUTH EVERY 6 HOURS AS NEEDED FOR NAUSEA AND VOMITING What changed: See the new instructions.   vitamin B-12 1000 MCG tablet Commonly known as: CYANOCOBALAMIN Take 1,000 mcg by mouth daily.   VITAMIN C PO Take 1,000 mg by mouth daily.            Discharge Care Instructions  (From admission, onward)         Start     Ordered   03/30/20 0000  Change dressing (specify)       Comments: Measure and record drain output at least twice daily.    Keep drain #1 and 2 separate.    Change gauze around drains as needed to keep skin dry.  If skin is irritated, apply desitin or other zinc oxide around drain site.   03/30/20 8088          Follow-up Information    Stark Klein, MD Follow up in 2 week(s).   Specialty: General Surgery Contact information: [address] [place] [address] [phone]        [facility], [place] Follow up in 1 week(s).   Specialty: General Surgery Why: staple removal Contact information: [place] [address] [phone]               Signed: Sheria Lang, MD General Surgery  03/30/2020, 9:59 AM

## 2020-03-30 NOTE — Progress Notes (Signed)
Patient was educated on dressing changes and drains. Patient was given supplies to change dressing until home health can see patient. Port was de-accessed and D/C instructions covered all question answered.

## 2020-03-30 NOTE — Discharge Instructions (Signed)
Surgical [facility] Care Surgical drains are used to remove extra fluid that normally builds up in a surgical wound after surgery. A surgical drain helps to heal a surgical wound. Different kinds of surgical drains include:  Active drains. These drains use suction to pull drainage away from the surgical wound. Drainage flows through a tube to a container outside of the body. With these drains, you need to keep the bulb or the drainage container flat (compressed) at all times, except while you empty it. Flattening the bulb or container creates suction.  Passive drains. These drains allow fluid to drain naturally, by gravity. Drainage flows through a tube to a bandage (dressing) or a container outside of the body. Passive drains do not need to be emptied. A drain is placed during surgery. Right after surgery, drainage is usually bright red and a little thicker than water. The drainage may gradually turn yellow or pink and become thinner. It is likely that your health care provider will remove the drain when the drainage stops or when the amount decreases to 1-2 Tbsp (15-30 mL) during a 24-hour period. Supplies needed:  Tape.  Germ-free cleaning solution (sterile saline).  Cotton swabs.  Split gauze drain sponge: 4 x 4 inches (10 x 10 cm).  Gauze square: 4 x 4 inches (10 x 10 cm). How to care for your surgical drain Care for your drain as told by your health care provider. This is important to help prevent infection. If your drain is placed at your back, or any other hard-to-reach area, ask another person to assist you in performing the following tasks: General care  Keep the skin around the drain dry and covered with a dressing at all times.  Check your drain area every day for signs of infection. Check for: ? Redness, swelling, or pain. ? Pus or a bad smell. ? Cloudy drainage. ? Tenderness or pressure at the drain exit site. Changing the dressing Follow instructions from your health care  provider about how to change your dressing. Change your dressing at least once a day. Change it more often if needed to keep the dressing dry. Make sure you: 1. Gather your supplies. 2. Wash your hands with soap and water before you change your dressing. If soap and water are not available, use hand sanitizer. 3. Remove the old dressing. Avoid using scissors to do that. 4. Wash your hands with soap and water again after removing the old dressing. 5. Use sterile saline to clean your skin around the drain. You may need to use a cotton swab to clean the skin. 6. Place the tube through the slit in a drain sponge. Place the drain sponge so that it covers your wound. 7. Place the gauze square or another drain sponge on top of the drain sponge that is on the wound. Make sure the tube is between those layers. 8. Tape the dressing to your skin. 9. Tape the drainage tube to your skin 1-2 inches (2.5-5 cm) below the place where the tube enters your body. Taping keeps the tube from pulling on any stitches (sutures) that you have. 10. Wash your hands with soap and water. 11. Write down the color of your drainage and how often you change your dressing. How to empty your active drain  1. Make sure that you have a measuring cup that you can empty your drainage into. 2. Wash your hands with soap and water. If soap and water are not available, use hand sanitizer. 3.  Loosen any pins or clips that hold the tube in place. 4. If your health care provider tells you to strip the tube to prevent clots and tube blockages: ? Hold the tube at the skin with one hand. Use your other hand to pinch the tubing with your thumb and first finger. ? Gently move your fingers down the tube while squeezing very lightly. This clears any drainage, clots, or tissue from the tube. ? You may need to do this several times each day to keep the tube clear. Do not pull on the tube. 5. Open the bulb cap or the drain plug. Do not touch the  inside of the cap or the bottom of the plug. 6. Turn the device upside down and gently squeeze. 7. Empty all of the drainage into the measuring cup. 8. Compress the bulb or the container and replace the cap or the plug. To compress the bulb or the container, squeeze it firmly in the middle while you close the cap or plug the container. 9. Write down the amount of drainage that you have in each 24-hour period. If you have less than 2 Tbsp (30 mL) of drainage during 24 hours, contact your health care provider. 10. Flush the drainage down the toilet. 11. Wash your hands with soap and water. Contact a health care provider if:  You have redness, swelling, or pain around your drain area.  You have pus or a bad smell coming from your drain area.  You have a fever or chills.  The skin around your drain is warm to the touch.  The amount of drainage that you have is increasing instead of decreasing.  You have drainage that is cloudy.  There is a sudden stop or a sudden decrease in the amount of drainage that you have.  Your drain tube falls out.  Your active drain does not stay compressed after you empty it. Summary  Surgical drains are used to remove extra fluid that normally builds up in a surgical wound after surgery.  Different kinds of surgical drains include active drains and passive drains. Active drains use suction to pull drainage away from the surgical wound, and passive drains allow fluid to drain naturally.  It is important to care for your drain to prevent infection. If your drain is placed at your back, or any other hard-to-reach area, ask another person to assist you.  Contact your health care provider if you have redness, swelling, or pain around your drain area. This information is not intended to replace advice given to you by your health care provider. Make sure you discuss any questions you have with your health care provider. Document Revised: 05/23/2018 Document  Reviewed: 05/23/2018 Elsevier Patient Education  2020 [place] [facility], [place] [phone]  ABDOMINAL SURGERY: POST OP INSTRUCTIONS  Always review your discharge instruction sheet given to you by the facility where your surgery was performed.  IF YOU HAVE DISABILITY OR FAMILY LEAVE FORMS, YOU MUST BRING THEM TO THE OFFICE FOR PROCESSING.  PLEASE DO NOT GIVE THEM TO YOUR DOCTOR.  1. A prescription for pain medication may be given to you upon discharge.  Take your pain medication as prescribed, if needed.  If narcotic pain medicine is not needed, then you may take acetaminophen (Tylenol) or ibuprofen (Advil) as needed. 2. Take your usually prescribed medications unless otherwise directed. 3. If you need a refill on your pain medication, please contact your pharmacy.  They will contact our office to request authorization.  Prescriptions will not be filled after 5pm or on week-ends. 4. You should follow a light diet the first few days after arrival home, such as soup and crackers, pudding, etc.unless your doctor has advised otherwise. A high-fiber, low fat diet can be resumed as tolerated.   Be sure to include lots of fluids daily. Most patients will experience some swelling and bruising on the chest and neck area.  Ice packs will help.  Swelling and bruising can take several days to resolve 5. Most patients will experience some swelling and bruising in the area of the incision. Ice pack will help. Swelling and bruising can take several days to resolve..  6. It is common to experience some constipation if taking pain medication after surgery.  Increasing fluid intake and taking a stool softener will usually help or prevent this problem from occurring.  A mild laxative (Milk of Magnesia or Miralax) should be taken according to package directions if there are no bowel movements after 48 hours. 7.  You may have steri-strips (small skin tapes) in place directly over the  incision.  These strips should be left on the skin for 10-14 days.  If your surgeon used skin glue on the incision, you may shower in 48 hours.  The glue will flake off over the next 2-3 weeks.  Any sutures or staples will be removed at the office during your follow-up visit. You may find that a light gauze bandage over your incision may keep your staples from being rubbed or pulled. You may shower and replace the bandage daily. 8. ACTIVITIES:  You may resume regular (light) daily activities beginning the next day--such as daily self-care, walking, climbing stairs--gradually increasing activities as tolerated.  You may have sexual intercourse when it is comfortable.  Refrain from any heavy lifting or straining until approved by your doctor. a. You may drive when you no longer are taking prescription pain medication, you can comfortably wear a seatbelt, and you can safely maneuver your car and apply brakes b. Return to Work: __________8 weeks if applicable_________________________ 51. You should see your doctor in the office for a follow-up appointment approximately two weeks after your surgery.  Make sure that you call for this appointment within a day or two after you arrive home to insure a convenient appointment time. OTHER INSTRUCTIONS:  _____________________________________________________________ _____________________________________________________________  WHEN TO CALL YOUR DOCTOR: 1. Fever over 101.0 2. Inability to urinate 3. Nausea and/or vomiting 4. Extreme swelling or bruising 5. Continued bleeding from incision. 6. Increased pain, redness, or drainage from the incision. 7. Difficulty swallowing or breathing 8. Muscle cramping or spasms. 9. Numbness or tingling in hands or feet or around lips.  The clinic staff is available to answer your questions during regular business hours.  Please don't hesitate to call and ask to speak to one of the nurses if you have concerns.  For further  questions, please visit [email or web address]

## 2020-03-30 NOTE — TOC Transition Note (Signed)
Transition of Care (TOC) - CM/SW Discharge Note Marvetta Gibbons RN,BSN Transitions of Care Unit 4NP (non trauma) - RN Case Manager See Treatment Team for direct Phone #   Patient Details  Name: Breanna Noble MRN: [id number] Date of Birth: [birth year]  Transition of Care [facility]) CM/SW Contact:  Dawayne Patricia, RN Phone Number: 03/30/2020, 1:55 PM   Clinical Narrative:    Pt stable for transition home today with drains, HH orders placed for HHRN/PT- CM spoke with pt at bedside for transition needs. Per conversation with pt and spouse - pt has walker and shower chair at home- does not feel like she will need 3n1 as her bathroom is close to bedroom. List provided Per CMS guidelines from medicare.gov website with star ratings (copy placed in shadow chart) for [facility] choice-- per pt she does not have a preference for agency and defers to this writer to secure agency on her behalf.  Address, phone #s and PCP all confirmed with pt in epic.   Call made to [place] with [facility] for [facility] referral- await return call [phone]- referral has been accepted. Will call pt and let her know agency that has been secured for [facility] services.    Final next level of care: [place] Barriers to Discharge: No Barriers Identified   Patient Goals and CMS Choice Patient states their goals for this hospitalization and ongoing recovery are:: return home and get stronger to be able to take care of grandbaby again CMS Medicare.gov Compare Post Acute Care list provided to:: Patient Choice offered to / list presented to : Patient, Spouse  Discharge Placement               Home with [facility]        Discharge Plan and Services   Discharge Planning Services: CM Consult Post Acute Care Choice: Home Health            DME Agency: NA       HH Arranged: RN, PT [facility] Agency: [facility] ([place]) Date [place]: 03/30/20 Time HH Agency Contacted: 60 Representative spoke with at [place]:  [place] ([place]) Interventions     Readmission Risk Interventions Readmission Risk Prevention Plan 03/30/2020  Post Dischage Appt Complete  Medication Screening Complete  Transportation Screening Complete  Some recent data might be hidden

## 2020-03-31 LAB — TYPE AND SCREEN
ABO/RH(D): O POS
Antibody Screen: NEGATIVE
Unit division: 0
Unit division: 0
Unit division: 0
Unit division: 0

## 2020-03-31 LAB — BPAM RBC
Blood Product Expiration Date: 202112142359
Blood Product Expiration Date: 202112212359
Blood Product Expiration Date: 202112252359
Blood Product Expiration Date: 202112252359
ISSUE DATE / TIME: 202111230808
ISSUE DATE / TIME: 202111230808
Unit Type and Rh: 5100
Unit Type and Rh: 5100
Unit Type and Rh: 5100
Unit Type and Rh: 5100

## 2020-03-31 LAB — SURGICAL PATHOLOGY

## 2020-04-03 ENCOUNTER — Telehealth: Payer: Self-pay | Admitting: General Surgery

## 2020-04-03 NOTE — Telephone Encounter (Signed)
Discussed pathology with patient.  1/22 nodes positive Margins negative,  Tumor down to 1 cm.    Pt has appt with Dr. Burr Medico 12/16.

## 2020-04-14 ENCOUNTER — Other Ambulatory Visit: Payer: Self-pay

## 2020-04-14 ENCOUNTER — Other Ambulatory Visit (HOSPITAL_COMMUNITY): Payer: Self-pay | Admitting: General Surgery

## 2020-04-14 ENCOUNTER — Ambulatory Visit (HOSPITAL_BASED_OUTPATIENT_CLINIC_OR_DEPARTMENT_OTHER)
Admission: RE | Admit: 2020-04-14 | Discharge: 2020-04-14 | Disposition: A | Discharge status: Home / Self Care | Payer: BC Managed Care – PPO | Source: Ambulatory Visit | Attending: General Surgery | Admitting: General Surgery

## 2020-04-14 ENCOUNTER — Other Ambulatory Visit: Payer: Self-pay | Admitting: General Surgery

## 2020-04-14 DIAGNOSIS — L02211 Cutaneous abscess of abdominal wall: Secondary | ICD-10-CM

## 2020-04-14 DIAGNOSIS — C250 Malignant neoplasm of head of pancreas: Secondary | ICD-10-CM | POA: Diagnosis not present

## 2020-04-14 MED ORDER — IOHEXOL 300 MG/ML  SOLN
100.0000 mL | Freq: Once | INTRAMUSCULAR | Status: AC | PRN
  Administered 2020-04-14: 80 mL via INTRAVENOUS

## 2020-04-15 NOTE — Progress Notes (Signed)
Breanna Noble   Telephone:([phone]) [phone] Fax:(336) [phone]   Clinic Follow up Note   Patient Care Team: Vernie Shanks, MD as PCP - General (Family Medicine) Fontaine, Belinda Block, MD (Inactive) as Consulting Physician (Gynecology) Truitt Merle, MD as Consulting Physician (Oncology) Jonnie Finner, RN as Registered Nurse Arta Silence, MD as Consulting Physician (Gastroenterology) Stark Klein, MD as Consulting Physician (General Surgery) Karie Mainland, RD as Dietitian (Nutrition)  Date of Service:  04/16/2020  CHIEF COMPLAINT: F/u of Pancreatic Cancer  SUMMARY OF ONCOLOGIC HISTORY: Oncology History Overview Note  Cancer Staging Pancreatic cancer [facility]) Staging form: Exocrine Pancreas, AJCC 8th Edition - Clinical stage from 10/16/2019: Stage IIB (cT2, cN1, cM0) - Signed by Truitt Merle, MD on 10/16/2019    Pancreatic cancer ([place])  10/03/2019 Imaging   US Abdomen 10/03/19  IMPRESSION: Markedly dilated bile ducts. Dilated gallbladder with sludge but no gallstones.   2.9 cm mass in the uncinate process most likely pancreatic neoplasm causing biliary obstruction. Recommend MRI of the pancreas without with contrast for further evaluation. If the patient cannot have MRI, CT of the pancreas with contrast recommended.   10/07/2019 Imaging   MRI Abdomen 10/07/19  IMPRESSION: 1. There is a hypoenhancing mass of the pancreatic uncinate with abrupt obstruction of the central common bile duct, measuring approximately 2.9 cm, consistent with pancreatic adenocarcinoma. 2. Gross intra and extrahepatic biliary ductal dilatation, common bile duct measuring 1.4 cm. Gallbladder hydrops. 3. The pancreatic duct is nondilated. 4. Pancreatic mass lies closely adjacent to the most central portions of the superior mesenteric vein and central portal vein, due to motion artifact it is difficult to clearly discern whether there is a preserved fat plane. Multiphasic contrast  enhanced pancreatic protocol CT may be less motion sensitive and better delineate vascular structures for the purposes of surgical planning if necessary. 5. No evidence of lymphadenopathy or metastatic disease in the abdomen.   10/10/2019 Procedure   EUS by Dr Paulita Fujita 10/10/19  IMPRESSION - A mass was identified at junction of head/uncinate process of the pancreas. This was staged T2 N1 Mx by endosonographic criteria. Fine needle aspiration performed. - A few abnormal lymph nodes were visualized in the peripancreatic region. - There was dilation in the common bile duct which measured up to 14 mm. - Hyperechoic material consistent with sludge was visualized endosonographically in the gallbladder.   10/10/2019 Procedure   ERCP by Dr Paulita Fujita 10/10/19  IMPRESSION - The major papilla appeared normal. - One temporary stent was placed into the ventral pancreatic duct. - A biliary sphincterotomy was performed. - One covered metal stent was placed into the common bile duct.   10/10/2019 Initial Biopsy   FINAL MICROSCOPIC DIAGNOSIS: 10/10/19  A. PANCREAS, HEAD, FINE NEEDLE ASPIRATION:  - Malignant cells present, consistent with adenocarcinoma    10/16/2019 Initial Diagnosis   Pancreatic cancer ([place])   10/16/2019 Cancer Staging   Staging form: Exocrine Pancreas, AJCC 8th Edition - Clinical stage from 10/16/2019: Stage IIB (cT2, cN1, cM0) - Signed by Truitt Merle, MD on 10/16/2019   10/23/2019 Imaging   CT CAP W contrast  IMPRESSION: 1. Pancreatic mass with local adenopathy and potential retroperitoneal adenopathy. Given retroperitoneal lymph nodes and presence of venous collaterals PET scan may be useful for staging purposes. 2. No definite vascular involvement or signs of upper abdominal venous collaterals. Some stranding around the celiac may be due to recent biopsy. 3. Borderline enlarged RIGHT external iliac lymph node. Attention on follow-up. 4. No evidence  of metastatic disease in the  chest. 5. Signs of recent ERCP and stent placement.   10/25/2019 Procedure   PAC placed    11/09/2019 - 02/20/2020 Chemotherapy   Neoadjuvant FOLFIRINOX q2weeks starting 11/09/19-02/20/20 for 8 cycles   11/28/2019 Genetic Testing   Negative genetic testing on the common hereditary cancer panel, pancreatic cancer panel, preliminary evidence pancreatic cancer panel and chronic pancreatitis panel.  The Common Hereditary Gene Panel offered by [facility] includes sequencing and/or deletion duplication testing of the following 55 genes: APC*, ATM*, AXIN2, BARD1, BMPR1A, BRCA1, BRCA2, BRIP1, CASR, CDH1, CDK4, CDKN2A (p14ARF), CDKN2A (p16INK4a), CFTR*, CHEK2, CPA1, CTNNA1, CTRC, DICER1*, EPCAM*, FANCC, GREM1*, HOXB13, KIT, MEN1*, MLH1*, MSH2*, MSH3*, MSH6*, MUTYH, NBN, NF1*, NTHL1, PALB2, PALLD, PDGFRA, PMS2*, POLD1*, POLE, PRSS1*, PTEN*, RAD50, RAD51C, RAD51D, SDHA*, SDHB, SDHC*, SDHD, SMAD4, SMARCA4, SPINK1, STK11, TP53, TSC1*, TSC2, and VHL .  The report date is November 28, 2019.   01/28/2020 Imaging   CT AP w contrast  IMPRESSION: 1. No substantial interval change in exam. 2. Similar appearance of the pancreatic mass with associated main pancreatic ductal dilatation. 3. Soft tissue stranding again noted around the celiac axis, in the para-aortic retroperitoneal space and along the IMA (new in the interval). These findings are associated with similar appearance of upper normal to borderline hepatoduodenal ligament and retroperitoneal lymphadenopathy. Close continued attention on follow-up recommended. 4. Borderline enlarged right external iliac node identified on previous study has decreased in size in the interval.   02/14/2020 PET scan   IMPRESSION: 1. The pancreatic mass has a maximum SUV of 6.1. No findings of hypermetabolic local adenopathy or distant metastatic spread. 2. Splenomegaly. 3. Inspissated barium or appendicolith within the appendix. No signs of appendiceal inflammation. 4.  Scattered sigmoid colon diverticula. ADDENDUM: The original report was by Dr. Van Clines. The following addendum is by Dr. Van Clines:   On the CT from 01/28/2020 there were some upper normal sized/borderline retroperitoneal lymph nodes including a 0.8 cm aortocaval node on image 72 of series 9. This currently has a maximum SUV of 2.2, which is similar to blood pool activity. An indistinctly marginated lymph node just below the left renal vein has a maximum SUV of 2.4, just above blood pool levels., and measures about 1.0 cm in diameter. Lymph nodes anterior to the left psoas muscle have maximum SUV of 2.0, similar to blood pool. Overall these lymph nodes are borderline enlarged and with activity level at or just minimally above blood pool. While certainly not grossly positive, their proximity to the pancreatic head mass and borderline imaging characteristics make it difficult to exclude early nodal involvement by malignancy.   03/24/2020 Surgery   Whipple surgery, Laparoscopic Diagnostic, Appendectomy by Dr Barry Dienes    03/24/2020 Pathology Results   FINAL MICROSCOPIC DIAGNOSIS:   A. WHIPPLE PROCEDURE, WITH GALLBLADDER:  - Adenocarcinoma, moderately differentiated, spanning 1 cm.  - Tumor involves retroperitoneal peripancreatic soft tissue.  - Resection margins are negative.  - Treatment effect present.  - Rare tumor cells in one of fourteen lymph nodes (1/14).  - Benign gallbladder with chronic inflammation and benign lymph node.  - Bile duct with granulation tissue and reactive changes.  - Benign appendix.  - Mild reactive gastropathy.  - See oncology table.   B. LYMPH NODE, PORTAL, EXCISION:  - Three of three lymph nodes negative for carcinoma (0/3).   C. LYMPH NODE, COMMON HEPATIC ARTERY, EXCISION:  - Three of three lymph nodes negative for carcinoma (0/3).   D. APPENDIX, APPENDECTOMY:  -  Benign appendix.   E. LYMPH NODE, AORTOCAVAL, EXCISION:  - One of  one lymph node negative for carcinoma (0/1).    03/24/2020 Cancer Staging   Staging form: Exocrine Pancreas, AJCC 8th Edition - Pathologic stage from 03/24/2020: Stage IIB (pT1c, pN1, cM0) - Signed by Truitt Merle, MD on 04/16/2020   04/14/2020 Imaging   CT AP  IMPRESSION: 1. Interval Whipple procedure. Pancreatic stent in place. 2. Ill-defined soft tissue in the right abdominal wall extending from the subcutaneous tissues to the anterior abdominal wall musculature, but no focal fluid collection. This may represent postsurgical change, sterility indeterminate by imaging. 3. Pelvic anatomy is poorly defined, including pelvic bowel loops, adnexa, and colon. There appears to be sigmoid colonic wall thickening as well as intraluminal fluid, with additional diffuse colonic wall thickening from the transverse colon distally. Findings are suggestive of generalized colitis. Attention to this area on follow-up is recommended. 4. Small amount of non organized free fluid in the right upper quadrant. No evidence of intra-abdominal abscess. 5. Periportal edema, nonspecific. Previous biliary stent has been removed. Common bile duct is not well-defined on the current exam. 6. Mild splenomegaly. 7. Trace right pleural effusion.      CURRENT THERAPY:  Pending adjuvant chemo gemcitabien and Xeloda   INTERVAL HISTORY:  Breanna Noble is here for a follow up. She presents to the clinic with her husband.  She underwent Whipple surgery on March 14, 2020.  Her drainage tube was removed last week.  She has been doing well on $Remov'2 4 5 'lNFzXo$ days ago, when she developed abdominal pain, diarrhea, and intermittent nausea.  She denies any fever or chills.  She called Dr. Marlowe Aschoff office, had lab and CT scan, and was started on antibiotics for colitis.  She noticed quite a bit of pus coming out at the right upper quadrant previous drainage tube site last night and this morning, and she has felt overall much better today  after the drainage.  Her diarrhea has improved also. Overall she is still weak, has not gained any weight back since her surgery.    All other systems were reviewed with the patient and are negative.  MEDICAL HISTORY:  Past Medical History:  Diagnosis Date  . Cancer [facility])    pancreatic  . Cataracts, bilateral   . Diverticulosis 12/07/2001   found on colonoscopy  . Family history of colon cancer   . Family history of pancreatic cancer   . Hypothyroid 10/2007  . Macular hole   . Pancreatic mass   . PONV (postoperative nausea and vomiting)     SURGICAL HISTORY: Past Surgical History:  Procedure Laterality Date  . APPENDECTOMY N/A 03/24/2020   Procedure: APPENDECTOMY;  Surgeon: Stark Klein, MD;  Location: [place];  Service: General;  Laterality: N/A;  . BILIARY STENT PLACEMENT  10/10/2019   Procedure: BILIARY STENT PLACEMENT;  Surgeon: Arta Silence, MD;  Location: [place];  Service: Endoscopy;;  . CATARACT EXTRACTION, BILATERAL    . COLONOSCOPY    . ERCP N/A 10/10/2019   Procedure: ENDOSCOPIC RETROGRADE CHOLANGIOPANCREATOGRAPHY (ERCP);  Surgeon: Arta Silence, MD;  Location[facility] ENDOSCOPY;  Service: Endoscopy;  Laterality: N/A;  . ESOPHAGOGASTRODUODENOSCOPY N/A 10/10/2019   Procedure: ESOPHAGOGASTRODUODENOSCOPY (EGD);  Surgeon: Arta Silence, MD;  Location[facility] ENDOSCOPY;  Service: Endoscopy;  Laterality: N/A;  . EUS N/A 10/10/2019   Procedure: UPPER ENDOSCOPIC ULTRASOUND (EUS) LINEAR;  Surgeon: Arta Silence, MD;  Location: [place];  Service: Endoscopy;  Laterality: N/A;  . EYE SURGERY    .  FINE NEEDLE ASPIRATION  10/10/2019   Procedure: FINE NEEDLE ASPIRATION (FNA) LINEAR;  Surgeon: Arta Silence, MD;  Location: [place];  Service: Endoscopy;;  . IR IMAGING GUIDED PORT INSERTION  10/25/2019  . LAPAROSCOPY N/A 03/24/2020   Procedure: LAPAROSCOPY DIAGNOSTIC;  Surgeon: Stark Klein, MD;  Location: [place];  Service: General;  Laterality: N/A;  . Macular hole surg     . PANCREATIC STENT PLACEMENT  10/10/2019   Procedure: PANCREATIC STENT PLACEMENT;  Surgeon: Arta Silence, MD;  Location: [facility] ENDOSCOPY;  Service: Endoscopy;;  . SPHINCTEROTOMY  10/10/2019   Procedure: Joan Mayans;  Surgeon: Arta Silence, MD;  Location[facility] ENDOSCOPY;  Service: Endoscopy;;  . TONSILLECTOMY    . TONSILLECTOMY    . WHIPPLE PROCEDURE N/A 03/24/2020   Procedure: WHIPPLE PROCEDURE;  Surgeon: Stark Klein, MD;  Location: [place];  Service: General;  Laterality: N/A;    I have reviewed the social history and family history with the patient and they are unchanged from previous note.  ALLERGIES:  has No Known Allergies.  MEDICATIONS:  Current Outpatient Medications  Medication Sig Dispense Refill  . Ascorbic Acid (VITAMIN C PO) Take 1,000 mg by mouth daily.     . B Complex Vitamins (B-COMPLEX/B-12 PO) Take by mouth. (Patient not taking: Reported on 03/17/2020)    . Calcium Carb-Cholecalciferol (CALCIUM 1000 + D PO) Take 1 tablet by mouth daily.     . diphenhydrAMINE (BENADRYL) 25 MG tablet Take 25 mg by mouth at bedtime.    . ferrous sulfate 325 (65 FE) MG tablet Take 325 mg by mouth daily with breakfast.    . ibuprofen (ADVIL) 200 MG tablet Take 400 mg by mouth every 6 (six) hours as needed for moderate pain.    Marland Kitchen levothyroxine (SYNTHROID, LEVOTHROID) 50 MCG tablet Take 1 tablet (50 mcg total) by mouth daily. 90 tablet 4  . lidocaine-prilocaine (EMLA) cream Apply to affected area once (Patient taking differently: Apply 1 application topically daily as needed (prior to port access). ) 30 g 3  . LORazepam (ATIVAN) 0.5 MG tablet Take 1 tablet (0.5 mg total) by mouth daily as needed for anxiety. For anticipatory n/v prior to chemo treatments (Patient not taking: Reported on 03/17/2020) 15 tablet 0  . Multiple Vitamin (MULTIVITAMIN PO) Take 1 tablet by mouth daily.     . mupirocin ointment (BACTROBAN) 2 % Place 1 application into the nose daily as needed (sore in nose). (Patient  not taking: Reported on 03/17/2020)    . ondansetron (ZOFRAN) 4 MG tablet Take 4 mg by mouth every 8 (eight) hours as needed. (Patient not taking: Reported on 03/17/2020)    . ondansetron (ZOFRAN) 8 MG tablet Take 1 tablet (8 mg total) by mouth 2 (two) times daily as needed. Start on day 3 after chemotherapy. (Patient not taking: Reported on 03/17/2020) 30 tablet 1  . oxyCODONE (OXY IR/ROXICODONE) 5 MG immediate release tablet Take 1 tablet (5 mg total) by mouth every 6 (six) hours as needed for moderate pain. 30 tablet 0  . pantoprazole (PROTONIX) 40 MG tablet Take 1 tablet (40 mg total) by mouth daily with supper. 30 tablet 1  . prochlorperazine (COMPAZINE) 10 MG tablet TAKE 1 TABLET BY MOUTH EVERY 6 HOURS AS NEEDED FOR NAUSEA AND VOMITING (Patient taking differently: Take 10 mg by mouth every 6 (six) hours as needed for nausea or vomiting. ) 30 tablet 0  . vitamin B-12 (CYANOCOBALAMIN) 1000 MCG tablet Take 1,000 mcg by mouth daily.  No current facility-administered medications for this visit.    PHYSICAL EXAMINATION: ECOG PERFORMANCE STATUS: 2 - Symptomatic, <50% confined to bed  Vitals:   04/16/20 1310  BP: 139/71  Pulse: (!) 109  Resp: 16  Temp: 98 F (36.7 C)  SpO2: 100%   Filed Weights   04/16/20 1310  Weight: 119 lb 14.4 oz (54.4 kg)    GENERAL:alert, no distress and comfortable SKIN: skin color, texture, turgor are normal, no rashes or significant lesions.  EYES: normal, Conjunctiva are pink and non-injected, sclera clear NECK: supple, thyroid normal size, non-tender, without nodularity LYMPH:  no palpable lymphadenopathy in the cervical, axillary  LUNGS: clear to auscultation and percussion with normal breathing effort HEART: regular rate & rhythm and no murmurs and no lower extremity edema ABDOMEN:abdomen soft, non-tender and normal bowel sounds, there is significant skin erythema, and hardness around the right upper quadrant previous draining site, with pus.(picture  below) Musculoskeletal:no cyanosis of digits and no clubbing  NEURO: alert & oriented x 3 with fluent speech, no focal motor/sensory deficits    LABORATORY DATA:  I have reviewed the data as listed CBC Latest Ref Rng & Units 04/16/2020 03/30/2020 03/29/2020  WBC 4.0 - 10.5 K/uL 23.6(H) 6.1 6.3  Hemoglobin 12.0 - 15.0 g/dL 9.0(L) 7.8(L) 7.9(L)  Hematocrit 36.0 - 46.0 % 28.1(L) 25.0(L) 25.3(L)  Platelets 150 - 400 K/uL 466(H) 148(L) 170     CMP Latest Ref Rng & Units 04/16/2020 03/30/2020 03/29/2020  Glucose 70 - 99 mg/dL 125(H) 116(H) 153(H)  BUN 6 - 20 mg/dL 6 <5(L) <5(L)  Creatinine 0.44 - 1.00 mg/dL 0.52 0.36(L) 0.59  Sodium 135 - 145 mmol/L 131(L) 134(L) 134(L)  Potassium 3.5 - 5.1 mmol/L 3.5 3.9 3.1(L)  Chloride 98 - 111 mmol/L 92(L) 101 103  CO2 22 - 32 mmol/L $RemoveB'28 25 23  'sThoKOVc$ Calcium 8.9 - 10.3 mg/dL 8.8(L) 7.7(L) 7.6(L)  Total Protein 6.5 - 8.1 g/dL 7.3 4.9(L) 5.1(L)  Total Bilirubin 0.3 - 1.2 mg/dL 0.4 0.3 0.3  Alkaline Phos 38 - 126 U/L 160(H) 107 110  AST 15 - 41 U/L $Remo'23 20 26  'XgFxL$ ALT 0 - 44 U/L $Remo'15 26 30      'qtnAu$ RADIOGRAPHIC STUDIES: I have personally reviewed the radiological images as listed and agreed with the findings in the report. No results found.   ASSESSMENT & PLAN:  ROBIE OATS is a 59 y.o. female with   1. Pancreatic Cancer, Stage IIB,cT2N1M0, ypT1N1 -She was diagnosed in 10/2019.Her initialUS showed bile duct and gallbladder dilation due to 2-3cm mass at head of pancreas/Uncinate. Her MRI showed her mass is laying close to SMV but not involving it. Her CT CAP from 10/23/19 showedborderline potential adenopathy in the periaortic region which will be followed on restaging scans. There is no definite vascular involvementor distant metastasis. -She underwent EUS and ERCP on 10/10/19 and had 2 stents placed by Dr Paulita Fujita. Herpancreatic mass biopsyshowed Adenocarcinoma of Pancreas.Endoscopic staging T2N1, noevidence of vascular involvement. -She completed 8 cycles  of neoadjuvant FOLFIRINOX7/10/21-10/21/21, tolerated moderately well -She underwent Whipple surgery with Dr Barry Dienes on 03/24/20. I discussed her pathology which showed a residual 1 cm moderately differentiated adenocarcinoma, and 1 out of 14 positive lymph nodes.  Surgical margins were negative.  Lymph nodes biopsy from portal,, hepatic artery, and aortocaval were all negative. -We discussed her that she had good response to neoadjuvant chemotherapy, but still has significant residual disease, especially positive lymph nodes, which will predict high risk for recurrence after surgery. -I  recommended 2 more months adjuvant chemotherapy, we discussed the option of FOLFIRINOX, versus gemcitabine and Xeloda.  Patient is recovering slowly from surgery, may not tolerated FOLFIRINOX well, I think is reasonable to do gemcitabine and Xeloda -We will see her back in 3 weeks, if she has recovered well, plan to start chemo the following week  2.  Small abscess in the right upper quadrant draining tube site -She is already on oral antibiotics for recent colitis, the past spontaneous drained last night and she feels better -We will send a message to Dr. Barry Dienes to see if she needs I&D  3. Recent colitis -Managed by Dr. Barry Dienes, antibiotics, diarrhea has improved   2. Genetic Testingin 10/2019 was negative for pathogenetic mutations.    PLAN: -Surgical path reviewed -I recommend 2 more months adjuvant chemotherapy with gemcitabine and Xeloda every 3 weeks -Dietitian follow-up for weight loss -Lab fresh and follow-up in 3 weeks, to see if she is ready to start adjuvant chemo   No problem-specific Assessment & Plan notes found for this encounter.   No orders of the defined types were placed in this encounter.  All questions were answered. The patient knows to call the clinic with any problems, questions or concerns. No barriers to learning was detected. The total time spent in the appointment was 30  minutes.     Truitt Merle, MD 04/16/2020   I, Joslyn Devon, am acting as scribe for Truitt Merle, MD.   I have reviewed the above documentation for accuracy and completeness, and I agree with the above.

## 2020-04-15 NOTE — Progress Notes (Signed)
Nutrition  Secure chat received from nursing requesting diet education materials.  Provided handout regarding Diet and Nutrition after Whipple Procedure to Abigail Butts, [place].  Tanara Turvey B. Zenia Resides, [place], [place] Registered Dietitian [phone] (mobile)

## 2020-04-16 ENCOUNTER — Inpatient Hospital Stay: Payer: BC Managed Care – PPO | Attending: Hematology | Admitting: Hematology

## 2020-04-16 ENCOUNTER — Other Ambulatory Visit: Payer: Self-pay

## 2020-04-16 ENCOUNTER — Inpatient Hospital Stay: Payer: BC Managed Care – PPO

## 2020-04-16 ENCOUNTER — Encounter: Payer: Self-pay | Admitting: Hematology

## 2020-04-16 VITALS — BP 139/71 | HR 109 | Temp 98.0°F | Resp 16 | Ht 65.0 in | Wt 119.9 lb

## 2020-04-16 DIAGNOSIS — Z95828 Presence of other vascular implants and grafts: Secondary | ICD-10-CM

## 2020-04-16 DIAGNOSIS — C257 Malignant neoplasm of other parts of pancreas: Secondary | ICD-10-CM | POA: Diagnosis not present

## 2020-04-16 DIAGNOSIS — L0291 Cutaneous abscess, unspecified: Secondary | ICD-10-CM | POA: Insufficient documentation

## 2020-04-16 DIAGNOSIS — C250 Malignant neoplasm of head of pancreas: Secondary | ICD-10-CM | POA: Diagnosis not present

## 2020-04-16 DIAGNOSIS — K529 Noninfective gastroenteritis and colitis, unspecified: Secondary | ICD-10-CM | POA: Insufficient documentation

## 2020-04-16 LAB — CMP (CANCER CENTER ONLY)
ALT: 15 U/L (ref 0–44)
AST: 23 U/L (ref 15–41)
Albumin: 2.2 g/dL — ABNORMAL LOW (ref 3.5–5.0)
Alkaline Phosphatase: 160 U/L — ABNORMAL HIGH (ref 38–126)
Anion gap: 11 (ref 5–15)
BUN: 6 mg/dL (ref 6–20)
CO2: 28 mmol/L (ref 22–32)
Calcium: 8.8 mg/dL — ABNORMAL LOW (ref 8.9–10.3)
Chloride: 92 mmol/L — ABNORMAL LOW (ref 98–111)
Creatinine: 0.52 mg/dL (ref 0.44–1.00)
GFR, Estimated: >60 mL/min (ref >60)
Glucose, Bld: 125 mg/dL — ABNORMAL HIGH (ref 70–99)
Potassium: 3.5 mmol/L (ref 3.5–5.1)
Sodium: 131 mmol/L — ABNORMAL LOW (ref 135–145)
Total Bilirubin: 0.4 mg/dL (ref 0.3–1.2)
Total Protein: 7.3 g/dL (ref 6.5–8.1)

## 2020-04-16 LAB — CBC WITH DIFFERENTIAL (CANCER CENTER ONLY)
Abs Immature Granulocytes: 0.17 10*3/uL — ABNORMAL HIGH (ref 0.00–0.07)
Basophils Absolute: 0 10*3/uL (ref 0.0–0.1)
Basophils Relative: 0 %
Eosinophils Absolute: 0 10*3/uL (ref 0.0–0.5)
Eosinophils Relative: 0 %
HCT: 28.1 % — ABNORMAL LOW (ref 36.0–46.0)
Hemoglobin: 9 g/dL — ABNORMAL LOW (ref 12.0–15.0)
Immature Granulocytes: 1 %
Lymphocytes Relative: 12 %
Lymphs Abs: 2.8 10*3/uL (ref 0.7–4.0)
MCH: 27.7 pg (ref 26.0–34.0)
MCHC: 32 g/dL (ref 30.0–36.0)
MCV: 86.5 fL (ref 80.0–100.0)
Monocytes Absolute: 2.1 10*3/uL — ABNORMAL HIGH (ref 0.1–1.0)
Monocytes Relative: 9 %
Neutro Abs: 18.6 10*3/uL — ABNORMAL HIGH (ref 1.7–7.7)
Neutrophils Relative %: 78 %
Platelet Count: 466 10*3/uL — ABNORMAL HIGH (ref 150–400)
RBC: 3.25 MIL/uL — ABNORMAL LOW (ref 3.87–5.11)
RDW: 16.1 % — ABNORMAL HIGH (ref 11.5–15.5)
WBC Count: 23.6 10*3/uL — ABNORMAL HIGH (ref 4.0–10.5)
nRBC: 0 % (ref 0.0–0.2)

## 2020-04-16 MED ORDER — SODIUM CHLORIDE 0.9% FLUSH
10.0000 mL | Freq: Once | INTRAVENOUS | Status: AC
  Administered 2020-04-16: 10 mL
  Filled 2020-04-16: qty 10

## 2020-04-16 MED ORDER — HEPARIN SOD (PORK) LOCK FLUSH 100 UNIT/ML IV SOLN
500.0000 [IU] | Freq: Once | INTRAVENOUS | Status: AC
  Administered 2020-04-16: 500 [IU]
  Filled 2020-04-16: qty 5

## 2020-04-17 LAB — CANCER ANTIGEN 19-9: CA 19-9: 47 U/mL — ABNORMAL HIGH (ref 0–35)

## 2020-05-04 NOTE — Progress Notes (Signed)
[place]   [phone] [phone]   Clinic Follow up Note   Patient Care Team: Vernie Shanks, MD as PCP - General (Family Medicine) Fontaine, Belinda Block, MD (Inactive) as Consulting Physician (Gynecology) Truitt Merle, MD as Consulting Physician (Oncology) Jonnie Finner, RN as Registered Nurse Arta Silence, MD as Consulting Physician (Gastroenterology) Stark Klein, MD as Consulting Physician (General Surgery) Karie Mainland, RD as Dietitian (Nutrition)  Date of Service:  05/07/2020  CHIEF COMPLAINT: F/u of Pancreatic Cancer  SUMMARY OF ONCOLOGIC HISTORY: Oncology History Overview Note  Cancer Staging Pancreatic cancer [facility]) Staging form: Exocrine Pancreas, AJCC 8th Edition - Clinical stage from 10/16/2019: Stage IIB (cT2, cN1, cM0) - Signed by Truitt Merle, MD on 10/16/2019    Pancreatic cancer ([place])  10/03/2019 Imaging   US Abdomen 10/03/19  IMPRESSION: Markedly dilated bile ducts. Dilated gallbladder with sludge but no gallstones.   2.9 cm mass in the uncinate process most likely pancreatic neoplasm causing biliary obstruction. Recommend MRI of the pancreas without with contrast for further evaluation. If the patient cannot have MRI, CT of the pancreas with contrast recommended.   10/07/2019 Imaging   MRI Abdomen 10/07/19  IMPRESSION: 1. There is a hypoenhancing mass of the pancreatic uncinate with abrupt obstruction of the central common bile duct, measuring approximately 2.9 cm, consistent with pancreatic adenocarcinoma. 2. Gross intra and extrahepatic biliary ductal dilatation, common bile duct measuring 1.4 cm. Gallbladder hydrops. 3. The pancreatic duct is nondilated. 4. Pancreatic mass lies closely adjacent to the most central portions of the superior mesenteric vein and central portal vein, due to motion artifact it is difficult to clearly discern whether there is a preserved fat plane. Multiphasic contrast  enhanced pancreatic protocol CT may be less motion sensitive and better delineate vascular structures for the purposes of surgical planning if necessary. 5. No evidence of lymphadenopathy or metastatic disease in the abdomen.   10/10/2019 Procedure   EUS by Dr Paulita Fujita 10/10/19  IMPRESSION - A mass was identified at junction of head/uncinate process of the pancreas. This was staged T2 N1 Mx by endosonographic criteria. Fine needle aspiration performed. - A few abnormal lymph nodes were visualized in the peripancreatic region. - There was dilation in the common bile duct which measured up to 14 mm. - Hyperechoic material consistent with sludge was visualized endosonographically in the gallbladder.   10/10/2019 Procedure   ERCP by Dr Paulita Fujita 10/10/19  IMPRESSION - The major papilla appeared normal. - One temporary stent was placed into the ventral pancreatic duct. - A biliary sphincterotomy was performed. - One covered metal stent was placed into the common bile duct.   10/10/2019 Initial Biopsy   FINAL MICROSCOPIC DIAGNOSIS: 10/10/19  A. PANCREAS, HEAD, FINE NEEDLE ASPIRATION:  - Malignant cells present, consistent with adenocarcinoma    10/16/2019 Initial Diagnosis   Pancreatic cancer ([place])   10/16/2019 Cancer Staging   Staging form: Exocrine Pancreas, AJCC 8th Edition - Clinical stage from 10/16/2019: Stage IIB (cT2, cN1, cM0) - Signed by Truitt Merle, MD on 10/16/2019   10/23/2019 Imaging   CT CAP W contrast  IMPRESSION: 1. Pancreatic mass with local adenopathy and potential retroperitoneal adenopathy. Given retroperitoneal lymph nodes and presence of venous collaterals PET scan may be useful for staging purposes. 2. No definite vascular involvement or signs of upper abdominal venous collaterals. Some stranding around the celiac may be due to recent biopsy. 3. Borderline enlarged RIGHT external iliac lymph node. Attention on follow-up. 4. No evidence  of metastatic disease in the  chest. 5. Signs of recent ERCP and stent placement.   10/25/2019 Procedure   PAC placed    11/09/2019 - 02/20/2020 Chemotherapy   Neoadjuvant FOLFIRINOX q2weeks starting 11/09/19-02/20/20 for 8 cycles   11/28/2019 Genetic Testing   Negative genetic testing on the common hereditary cancer panel, pancreatic cancer panel, preliminary evidence pancreatic cancer panel and chronic pancreatitis panel.  The Common Hereditary Gene Panel offered by [facility] includes sequencing and/or deletion duplication testing of the following 55 genes: APC*, ATM*, AXIN2, BARD1, BMPR1A, BRCA1, BRCA2, BRIP1, CASR, CDH1, CDK4, CDKN2A (p14ARF), CDKN2A (p16INK4a), CFTR*, CHEK2, CPA1, CTNNA1, CTRC, DICER1*, EPCAM*, FANCC, GREM1*, HOXB13, KIT, MEN1*, MLH1*, MSH2*, MSH3*, MSH6*, MUTYH, NBN, NF1*, NTHL1, PALB2, PALLD, PDGFRA, PMS2*, POLD1*, POLE, PRSS1*, PTEN*, RAD50, RAD51C, RAD51D, SDHA*, SDHB, SDHC*, SDHD, SMAD4, SMARCA4, SPINK1, STK11, TP53, TSC1*, TSC2, and VHL .  The report date is November 28, 2019.   01/28/2020 Imaging   CT AP w contrast  IMPRESSION: 1. No substantial interval change in exam. 2. Similar appearance of the pancreatic mass with associated main pancreatic ductal dilatation. 3. Soft tissue stranding again noted around the celiac axis, in the para-aortic retroperitoneal space and along the IMA (new in the interval). These findings are associated with similar appearance of upper normal to borderline hepatoduodenal ligament and retroperitoneal lymphadenopathy. Close continued attention on follow-up recommended. 4. Borderline enlarged right external iliac node identified on previous study has decreased in size in the interval.   02/14/2020 PET scan   IMPRESSION: 1. The pancreatic mass has a maximum SUV of 6.1. No findings of hypermetabolic local adenopathy or distant metastatic spread. 2. Splenomegaly. 3. Inspissated barium or appendicolith within the appendix. No signs of appendiceal inflammation. 4.  Scattered sigmoid colon diverticula. ADDENDUM: The original report was by Dr. Van Clines. The following addendum is by Dr. Van Clines:   On the CT from 01/28/2020 there were some upper normal sized/borderline retroperitoneal lymph nodes including a 0.8 cm aortocaval node on image 72 of series 9. This currently has a maximum SUV of 2.2, which is similar to blood pool activity. An indistinctly marginated lymph node just below the left renal vein has a maximum SUV of 2.4, just above blood pool levels., and measures about 1.0 cm in diameter. Lymph nodes anterior to the left psoas muscle have maximum SUV of 2.0, similar to blood pool. Overall these lymph nodes are borderline enlarged and with activity level at or just minimally above blood pool. While certainly not grossly positive, their proximity to the pancreatic head mass and borderline imaging characteristics make it difficult to exclude early nodal involvement by malignancy.   03/24/2020 Surgery   Whipple surgery, Laparoscopic Diagnostic, Appendectomy by Dr Barry Dienes    03/24/2020 Pathology Results   FINAL MICROSCOPIC DIAGNOSIS:   A. WHIPPLE PROCEDURE, WITH GALLBLADDER:  - Adenocarcinoma, moderately differentiated, spanning 1 cm.  - Tumor involves retroperitoneal peripancreatic soft tissue.  - Resection margins are negative.  - Treatment effect present.  - Rare tumor cells in one of fourteen lymph nodes (1/14).  - Benign gallbladder with chronic inflammation and benign lymph node.  - Bile duct with granulation tissue and reactive changes.  - Benign appendix.  - Mild reactive gastropathy.  - See oncology table.   B. LYMPH NODE, PORTAL, EXCISION:  - Three of three lymph nodes negative for carcinoma (0/3).   C. LYMPH NODE, COMMON HEPATIC ARTERY, EXCISION:  - Three of three lymph nodes negative for carcinoma (0/3).   D. APPENDIX, APPENDECTOMY:  -  Benign appendix.   E. LYMPH NODE, AORTOCAVAL, EXCISION:  - One of  one lymph node negative for carcinoma (0/1).    03/24/2020 Cancer Staging   Staging form: Exocrine Pancreas, AJCC 8th Edition - Pathologic stage from 03/24/2020: Stage IIB (pT1c, pN1, cM0) - Signed by Truitt Merle, MD on 04/16/2020   04/14/2020 Imaging   CT AP  IMPRESSION: 1. Interval Whipple procedure. Pancreatic stent in place. 2. Ill-defined soft tissue in the right abdominal wall extending from the subcutaneous tissues to the anterior abdominal wall musculature, but no focal fluid collection. This may represent postsurgical change, sterility indeterminate by imaging. 3. Pelvic anatomy is poorly defined, including pelvic bowel loops, adnexa, and colon. There appears to be sigmoid colonic wall thickening as well as intraluminal fluid, with additional diffuse colonic wall thickening from the transverse colon distally. Findings are suggestive of generalized colitis. Attention to this area on follow-up is recommended. 4. Small amount of non organized free fluid in the right upper quadrant. No evidence of intra-abdominal abscess. 5. Periportal edema, nonspecific. Previous biliary stent has been removed. Common bile duct is not well-defined on the current exam. 6. Mild splenomegaly. 7. Trace right pleural effusion.      CURRENT THERAPY:  Pending adjuvant chemo gemcitabien and Xeloda, starting 05/18/2020  INTERVAL HISTORY:  Breanna Noble is here for a follow up. She presents to the clinic with her daughter  She is eating small meals, lost more weight since last visit  Low abdominal pain cramps, intermittent, BM normal  No chest pain or dyspnea  Energy is better, able to do some house work, sleeps better  She is up 50-60% during day  No fever, chill, or other new symptoms   All other systems were reviewed with the patient and are negative.  MEDICAL HISTORY:  Past Medical History:  Diagnosis Date  . Cancer [facility])    pancreatic  . Cataracts, bilateral   . Diverticulosis 12/07/2001    found on colonoscopy  . Family history of colon cancer   . Family history of pancreatic cancer   . Hypothyroid 10/2007  . Macular hole   . Pancreatic mass   . PONV (postoperative nausea and vomiting)     SURGICAL HISTORY: Past Surgical History:  Procedure Laterality Date  . APPENDECTOMY N/A 03/24/2020   Procedure: APPENDECTOMY;  Surgeon: Stark Klein, MD;  Location: [place];  Service: General;  Laterality: N/A;  . BILIARY STENT PLACEMENT  10/10/2019   Procedure: BILIARY STENT PLACEMENT;  Surgeon: Arta Silence, MD;  Location: [place];  Service: Endoscopy;;  . CATARACT EXTRACTION, BILATERAL    . COLONOSCOPY    . ERCP N/A 10/10/2019   Procedure: ENDOSCOPIC RETROGRADE CHOLANGIOPANCREATOGRAPHY (ERCP);  Surgeon: Arta Silence, MD;  Location: [facility] ENDOSCOPY;  Service: Endoscopy;  Laterality: N/A;  . ESOPHAGOGASTRODUODENOSCOPY N/A 10/10/2019   Procedure: ESOPHAGOGASTRODUODENOSCOPY (EGD);  Surgeon: Arta Silence, MD;  Location: [facility] ENDOSCOPY;  Service: Endoscopy;  Laterality: N/A;  . EUS N/A 10/10/2019   Procedure: UPPER ENDOSCOPIC ULTRASOUND (EUS) LINEAR;  Surgeon: Arta Silence, MD;  Location: [place];  Service: Endoscopy;  Laterality: N/A;  . EYE SURGERY    . FINE NEEDLE ASPIRATION  10/10/2019   Procedure: FINE NEEDLE ASPIRATION (FNA) LINEAR;  Surgeon: Arta Silence, MD;  Location: [place];  Service: Endoscopy;;  . IR IMAGING GUIDED PORT INSERTION  10/25/2019  . LAPAROSCOPY N/A 03/24/2020   Procedure: LAPAROSCOPY DIAGNOSTIC;  Surgeon: Stark Klein, MD;  Location: [place];  Service: General;  Laterality: N/A;  . Macular  hole surg    . PANCREATIC STENT PLACEMENT  10/10/2019   Procedure: PANCREATIC STENT PLACEMENT;  Surgeon: Arta Silence, MD;  Location: [facility] ENDOSCOPY;  Service: Endoscopy;;  . SPHINCTEROTOMY  10/10/2019   Procedure: Joan Mayans;  Surgeon: Arta Silence, MD;  Location: [facility] ENDOSCOPY;  Service: Endoscopy;;  . TONSILLECTOMY    . TONSILLECTOMY    . WHIPPLE  PROCEDURE N/A 03/24/2020   Procedure: WHIPPLE PROCEDURE;  Surgeon: Stark Klein, MD;  Location: [place];  Service: General;  Laterality: N/A;    I have reviewed the social history and family history with the patient and they are unchanged from previous note.  ALLERGIES:  has No Known Allergies.  MEDICATIONS:  Current Outpatient Medications  Medication Sig Dispense Refill  . Ascorbic Acid (VITAMIN C PO) Take 1,000 mg by mouth daily.     . B Complex Vitamins (B-COMPLEX/B-12 PO) Take by mouth. (Patient not taking: Reported on 03/17/2020)    . Calcium Carb-Cholecalciferol (CALCIUM 1000 + D PO) Take 1 tablet by mouth daily.     . diphenhydrAMINE (BENADRYL) 25 MG tablet Take 25 mg by mouth at bedtime.    . ferrous sulfate 325 (65 FE) MG tablet Take 325 mg by mouth daily with breakfast.    . ibuprofen (ADVIL) 200 MG tablet Take 400 mg by mouth every 6 (six) hours as needed for moderate pain.    Marland Kitchen levothyroxine (SYNTHROID, LEVOTHROID) 50 MCG tablet Take 1 tablet (50 mcg total) by mouth daily. 90 tablet 4  . lidocaine-prilocaine (EMLA) cream Apply to affected area once (Patient taking differently: Apply 1 application topically daily as needed (prior to port access). ) 30 g 3  . LORazepam (ATIVAN) 0.5 MG tablet Take 1 tablet (0.5 mg total) by mouth daily as needed for anxiety. For anticipatory n/v prior to chemo treatments (Patient not taking: Reported on 03/17/2020) 15 tablet 0  . Multiple Vitamin (MULTIVITAMIN PO) Take 1 tablet by mouth daily.     . mupirocin ointment (BACTROBAN) 2 % Place 1 application into the nose daily as needed (sore in nose). (Patient not taking: Reported on 03/17/2020)    . ondansetron (ZOFRAN) 4 MG tablet Take 4 mg by mouth every 8 (eight) hours as needed. (Patient not taking: Reported on 03/17/2020)    . ondansetron (ZOFRAN) 8 MG tablet Take 1 tablet (8 mg total) by mouth 2 (two) times daily as needed. Start on day 3 after chemotherapy. (Patient not taking: Reported on  03/17/2020) 30 tablet 1  . oxyCODONE (OXY IR/ROXICODONE) 5 MG immediate release tablet Take 1 tablet (5 mg total) by mouth every 6 (six) hours as needed for moderate pain. 30 tablet 0  . pantoprazole (PROTONIX) 40 MG tablet Take 1 tablet (40 mg total) by mouth daily with supper. 30 tablet 1  . prochlorperazine (COMPAZINE) 10 MG tablet TAKE 1 TABLET BY MOUTH EVERY 6 HOURS AS NEEDED FOR NAUSEA AND VOMITING (Patient taking differently: Take 10 mg by mouth every 6 (six) hours as needed for nausea or vomiting. ) 30 tablet 0  . vitamin B-12 (CYANOCOBALAMIN) 1000 MCG tablet Take 1,000 mcg by mouth daily.     No current facility-administered medications for this visit.    PHYSICAL EXAMINATION: ECOG PERFORMANCE STATUS: 2 - Symptomatic, <50% confined to bed  Vitals:   05/07/20 1139  BP: 129/69  Pulse: 94  Resp: 16  Temp: (!) 97.2 F (36.2 C)  SpO2: 100%   Filed Weights   05/07/20 1139  Weight: 109 lb 6.4 oz (49.6  kg)    GENERAL:alert, no distress and comfortable SKIN: skin color, texture, turgor are normal, no rashes or significant lesions EYES: normal, Conjunctiva are pink and non-injected, sclera clear NECK: supple, thyroid normal size, non-tender, without nodularity LYMPH:  no palpable lymphadenopathy in the cervical, axillary  LUNGS: clear to auscultation and percussion with normal breathing effort HEART: regular rate & rhythm and no murmurs and no lower extremity edema ABDOMEN:abdomen soft, non-tender and normal bowel sounds Musculoskeletal:no cyanosis of digits and no clubbing  NEURO: alert & oriented x 3 with fluent speech, no focal motor/sensory deficits  LABORATORY DATA:  I have reviewed the data as listed CBC Latest Ref Rng & Units 05/07/2020 04/16/2020 03/30/2020  WBC 4.0 - 10.5 K/uL 4.6 23.6(H) 6.1  Hemoglobin 12.0 - 15.0 g/dL 10.3(L) 9.0(L) 7.8(L)  Hematocrit 36.0 - 46.0 % 33.5(L) 28.1(L) 25.0(L)  Platelets 150 - 400 K/uL 146(L) 466(H) 148(L)     CMP Latest Ref Rng &  Units 04/16/2020 03/30/2020 03/29/2020  Glucose 70 - 99 mg/dL 125(H) 116(H) 153(H)  BUN 6 - 20 mg/dL 6 <5(L) <5(L)  Creatinine 0.44 - 1.00 mg/dL 0.52 0.36(L) 0.59  Sodium 135 - 145 mmol/L 131(L) 134(L) 134(L)  Potassium 3.5 - 5.1 mmol/L 3.5 3.9 3.1(L)  Chloride 98 - 111 mmol/L 92(L) 101 103  CO2 22 - 32 mmol/L _0 Calcium 8.9 - 10.3 mg/dL 8.8(L) 7.7(L) 7.6(L)  Total Protein 6.5 - 8.1 g/dL 7.3 4.9(L) 5.1(L)  Total Bilirubin 0.3 - 1.2 mg/dL 0.4 0.3 0.3  Alkaline Phos 38 - 126 U/L 160(H) 107 110  AST 15 - 41 U/L _1 ALT 0 - 44 U/L _2 RADIOGRAPHIC STUDIES: I have personally reviewed the radiological images as listed and agreed with the findings in the report. No results found.   ASSESSMENT & PLAN:  URIYAH MASSIMO is a 60 y.o. female with    1. Pancreatic Cancer, Stage IIB,cT2N1M0, ypT1N1 -She was diagnosed in 10/2019.Her initialUS showed bile duct and gallbladder dilation due to 2-3cm mass at head of pancreas/Uncinate. Her MRI showed her mass is laying close to SMV but not involving it. Her CT CAP from 10/23/19 showedborderline potential adenopathy in the periaortic region which will be followed on restaging scans. There is no definite vascular involvementor distant metastasis. -She underwent EUS and ERCP on 10/10/19 and had 2 stents placed by Dr Paulita Fujita. Herpancreatic mass biopsyshowed Adenocarcinoma of Pancreas.Endoscopic staging T2N1, noevidence of vascular involvement. -She completed 8 cycles of neoadjuvant FOLFIRINOX7/10/21-10/21/21, tolerated moderately well -She underwent Whipple surgery with Dr Barry Dienes on 03/24/20. Path showed a residual 1 cm moderately differentiated adenocarcinoma, and 1 out of 14 positive lymph nodes.  Surgical margins were negative.  Lymph nodes biopsy from portal, hepatic artery, and aortocaval were all negative. -We discussed her that she had good response to neoadjuvant chemotherapy, but still has significant residual disease,  especially positive lymph nodes, which will predict high risk for recurrence after surgery. -I recommended 2 more months adjuvant chemotherapy with gemcitabine and Xeloda. --Chemotherapy consent: Side effects including but does not not limited to, fatigue, nausea, vomiting, diarrhea, hair loss, neuropathy, fluid retention, renal and kidney dysfunction, neutropenic fever, needed for blood transfusion, bleeding, skin rash, were discussed with patient in great detail. She agrees to proceed. -the goal of chemo is curative  -she is recovering better from surgery, plan to start on May 18, 2020   3. Recent colitis -Managed by Dr. Barry Dienes, had antibiotics, diarrhea  has resolved  -Still has mild lower abdominal cramps occasionally   2. Genetic Testingin 10/2019 was negative for pathogenetic mutations.    PLAN: -She is recovering well -Plan to start Xeloda and gemcitabine 2 weeks on, 1 week off, on January 17 -I called in Xeloda 1500 mg in a.m., 1000 mg in evening, 2 weeks on, 1 week off to [place] today -f/u on 1/17  -Dietitian follow-up for weight loss   No problem-specific Assessment & Plan notes found for this encounter.   No orders of the defined types were placed in this encounter.  All questions were answered. The patient knows to call the clinic with any problems, questions or concerns. No barriers to learning was detected. The total time spent in the appointment was 40 minutes.     Truitt Merle, MD 05/07/2020   I, Breanna Noble, am acting as scribe for Truitt Merle, MD.   I have reviewed the above documentation for accuracy and completeness, and I agree with the above.

## 2020-05-07 ENCOUNTER — Encounter: Payer: Self-pay | Admitting: Hematology

## 2020-05-07 ENCOUNTER — Inpatient Hospital Stay: Payer: BC Managed Care – PPO

## 2020-05-07 ENCOUNTER — Telehealth: Payer: Self-pay | Admitting: Hematology

## 2020-05-07 ENCOUNTER — Inpatient Hospital Stay: Payer: BC Managed Care – PPO | Attending: Hematology

## 2020-05-07 ENCOUNTER — Inpatient Hospital Stay: Payer: BC Managed Care – PPO | Admitting: Hematology

## 2020-05-07 ENCOUNTER — Other Ambulatory Visit: Payer: Self-pay | Admitting: Hematology

## 2020-05-07 ENCOUNTER — Other Ambulatory Visit: Payer: Self-pay

## 2020-05-07 VITALS — BP 129/69 | HR 94 | Temp 97.2°F | Resp 16 | Ht 65.0 in | Wt 109.4 lb

## 2020-05-07 DIAGNOSIS — C257 Malignant neoplasm of other parts of pancreas: Secondary | ICD-10-CM | POA: Insufficient documentation

## 2020-05-07 DIAGNOSIS — Z5111 Encounter for antineoplastic chemotherapy: Secondary | ICD-10-CM | POA: Insufficient documentation

## 2020-05-07 DIAGNOSIS — Z95828 Presence of other vascular implants and grafts: Secondary | ICD-10-CM

## 2020-05-07 DIAGNOSIS — Z452 Encounter for adjustment and management of vascular access device: Secondary | ICD-10-CM | POA: Insufficient documentation

## 2020-05-07 DIAGNOSIS — D61810 Antineoplastic chemotherapy induced pancytopenia: Secondary | ICD-10-CM | POA: Diagnosis not present

## 2020-05-07 DIAGNOSIS — C250 Malignant neoplasm of head of pancreas: Secondary | ICD-10-CM | POA: Diagnosis not present

## 2020-05-07 LAB — CBC WITH DIFFERENTIAL (CANCER CENTER ONLY)
Abs Immature Granulocytes: 0.01 10*3/uL (ref 0.00–0.07)
Basophils Absolute: 0 10*3/uL (ref 0.0–0.1)
Basophils Relative: 1 %
Eosinophils Absolute: 0.1 10*3/uL (ref 0.0–0.5)
Eosinophils Relative: 2 %
HCT: 33.5 % — ABNORMAL LOW (ref 36.0–46.0)
Hemoglobin: 10.3 g/dL — ABNORMAL LOW (ref 12.0–15.0)
Immature Granulocytes: 0 %
Lymphocytes Relative: 40 %
Lymphs Abs: 1.9 10*3/uL (ref 0.7–4.0)
MCH: 27 pg (ref 26.0–34.0)
MCHC: 30.7 g/dL (ref 30.0–36.0)
MCV: 87.9 fL (ref 80.0–100.0)
Monocytes Absolute: 0.8 10*3/uL (ref 0.1–1.0)
Monocytes Relative: 17 %
Neutro Abs: 1.9 10*3/uL (ref 1.7–7.7)
Neutrophils Relative %: 40 %
Platelet Count: 146 10*3/uL — ABNORMAL LOW (ref 150–400)
RBC: 3.81 MIL/uL — ABNORMAL LOW (ref 3.87–5.11)
RDW: 17.7 % — ABNORMAL HIGH (ref 11.5–15.5)
WBC Count: 4.6 10*3/uL (ref 4.0–10.5)
nRBC: 0 % (ref 0.0–0.2)

## 2020-05-07 LAB — CMP (CANCER CENTER ONLY)
ALT: 21 U/L (ref 0–44)
AST: 38 U/L (ref 15–41)
Albumin: 3.1 g/dL — ABNORMAL LOW (ref 3.5–5.0)
Alkaline Phosphatase: 124 U/L (ref 38–126)
Anion gap: 7 (ref 5–15)
BUN: 5 mg/dL — ABNORMAL LOW (ref 6–20)
CO2: 27 mmol/L (ref 22–32)
Calcium: 8.9 mg/dL (ref 8.9–10.3)
Chloride: 103 mmol/L (ref 98–111)
Creatinine: 0.57 mg/dL (ref 0.44–1.00)
GFR, Estimated: >60 mL/min (ref >60)
Glucose, Bld: 125 mg/dL — ABNORMAL HIGH (ref 70–99)
Potassium: 3.7 mmol/L (ref 3.5–5.1)
Sodium: 137 mmol/L (ref 135–145)
Total Bilirubin: 0.4 mg/dL (ref 0.3–1.2)
Total Protein: 7.5 g/dL (ref 6.5–8.1)

## 2020-05-07 MED ORDER — SODIUM CHLORIDE 0.9% FLUSH
10.0000 mL | Freq: Once | INTRAVENOUS | Status: AC
  Administered 2020-05-07: 10 mL
  Filled 2020-05-07: qty 10

## 2020-05-07 MED ORDER — PANCRELIPASE (LIP-PROT-AMYL) 36000-114000 UNITS PO CPEP: ORAL_CAPSULE | ORAL | 0 refills | Status: DC

## 2020-05-07 MED ORDER — CAPECITABINE 500 MG PO TABS: ORAL_TABLET | ORAL | 2 refills | Status: DC

## 2020-05-07 MED ORDER — HEPARIN SOD (PORK) LOCK FLUSH 100 UNIT/ML IV SOLN
500.0000 [IU] | Freq: Once | INTRAVENOUS | Status: AC
  Administered 2020-05-07: 500 [IU]
  Filled 2020-05-07: qty 5

## 2020-05-07 NOTE — Telephone Encounter (Signed)
Scheduled appointments per 1/6 los; Spoke to patient who is aware of appointments dates and times.  

## 2020-05-07 NOTE — Progress Notes (Signed)
DISCONTINUE ON PATHWAY REGIMEN - Pancreatic Adenocarcinoma  No Medical Intervention - Off Treatment.  REASON: Other Reason PRIOR TREATMENT: Panc42: Referral to Surgery  START ON PATHWAY REGIMEN - Pancreatic Adenocarcinoma     A cycle is every 28 days:     Capecitabine      Gemcitabine   **Always confirm dose/schedule in your pharmacy ordering system**  Patient Characteristics: Post-Neoadjuvant Therapy and Resection (Neoadjuvant Pathologic Staging), Negative Margins (Includes Positive Lymph Nodes) Therapeutic Status: Post-Neoadjuvant Therapy and Resection (Neoadjuvant Pathologic Staging) AJCC T Category: ypT1 AJCC N Category: ypN1 AJCC M Category: cM0 AJCC 8 Stage Grouping: IIB Intent of Therapy: Curative Intent, Discussed with Patient

## 2020-05-07 NOTE — Progress Notes (Signed)
DISCONTINUE ON PATHWAY REGIMEN - Pancreatic Adenocarcinoma     A cycle is every 14 days:     Oxaliplatin      Leucovorin      Irinotecan      Fluorouracil   **Always confirm dose/schedule in your pharmacy ordering system**  REASON: Continuation Of Treatment PRIOR TREATMENT: PANOS94: mFOLFIRINOX q14 Days x 4 Cycles TREATMENT RESPONSE: Partial Response (PR)  Pancreatic Adenocarcinoma - No Medical Intervention - Off Treatment.  Patient Characteristics: Preoperative (Clinical Staging), Borderline Resectable, PS = 0,1 Therapeutic Status: Preoperative (Clinical Staging) AJCC T Category: cT2 AJCC N Category: cN1 Resectability Status: Borderline Resectable AJCC M Category: cM0 AJCC 8 Stage Grouping: IIB ECOG Performance Status: 1

## 2020-05-08 ENCOUNTER — Telehealth: Payer: Self-pay

## 2020-05-08 ENCOUNTER — Telehealth: Payer: Self-pay | Admitting: Pharmacist

## 2020-05-08 DIAGNOSIS — C250 Malignant neoplasm of head of pancreas: Secondary | ICD-10-CM

## 2020-05-08 LAB — CANCER ANTIGEN 19-9: CA 19-9: 45 U/mL — ABNORMAL HIGH (ref 0–35)

## 2020-05-08 MED ORDER — CAPECITABINE 500 MG PO TABS: ORAL_TABLET | ORAL | 2 refills | Status: DC

## 2020-05-08 NOTE — Telephone Encounter (Signed)
Oral Oncology Pharmacist Encounter  Received new prescription for Xeloda (capecitabine) for the adjuvant treatment of pancreatic cancer in conjunction with gemcitabine, planned duration 2 months per MD.  Prescription dose and frequency assessed.  CBC w/ Diff and CMP from 05/07/20 assessed, noted pltc 146 K/uL and Hgb 10.3 g/dL - no dose adjustments required, recommend monitoring throughout therapy.  Current medication list in Epic reviewed, DDIs with Xeloda identified: Category C DDI between Xeloda and Pantoprazole - proton-pump inhibitors can decrease efficacy of Xeloda - will discuss with patient alternatives to pantoprazole, such as H2RA's like famotidine while on Xeloda.  Evaluated chart and no patient barriers to medication adherence noted.   Patient's insurance requires medication be filled through [employer] Prescription redirected for dispensing.  Oral Oncology Clinic will continue to follow for insurance authorization, copayment issues, initial counseling and start date.  Leron Croak, PharmD, BCPS Hematology/Oncology Clinical Pharmacist [facility] [phone] 05/08/2020 9:15 AM

## 2020-05-08 NOTE — Telephone Encounter (Signed)
Oral Oncology Patient Advocate Encounter  Prior Authorization for XELODA has been approved.    PA# [id number] Effective dates: 05/08/20 through 05/08/21  Patient must fill at [facility] will continue to follow.    [place] [facility] [place] Phone [phone] Fax [phone] 05/08/2020 11:12 AM

## 2020-05-08 NOTE — Telephone Encounter (Signed)
Oral Oncology Patient Advocate Encounter  Received notification from [employer] that prior authorization for Xeloda is required.  PA submitted on CoverMyMeds Key [id number] Status is pending  Oral Oncology Clinic will continue to follow.  [place] [facility] [place] Phone [phone] Fax [phone] 05/08/2020 9:06 AM

## 2020-05-08 NOTE — Telephone Encounter (Cosign Needed)
Encounter opened in error

## 2020-05-11 ENCOUNTER — Inpatient Hospital Stay: Payer: BC Managed Care – PPO | Admitting: Nutrition

## 2020-05-11 NOTE — Progress Notes (Signed)
Telephone follow-up completed with patient status post Whipple on March 24, 2020 secondary to pancreas cancer. Noted current weight of 109.4 pounds on January 6.  This is decreased from 139.7 pounds November 23.  This is a 22% weight loss over approximately 6 weeks which is significant. Patient reports she has been trying to increase oral intake.  She reports indigestion making it difficult for her to increase oral intake. She describes this as increased gas. Noted she is on Protonix 1 time a day. Reports appetite is slowly improving. States she did not tolerate Creon.  She developed diarrhea and severe abdominal cramping after taking Creon.  This resolved when she discontinued it. She has been trying to drink 1 Ensure mixed with ice cream every day.  She would like to drink more however she cannot tolerate additional supplements.  She is trying to consume small amounts of foods such as potatoes and green beans.  Nutrition diagnosis: Severe malnutrition related to pancreas cancer status post Whipple surgery evidenced by weight loss and inadequate oral intake.  Intervention: Educated patient on small frequent meals and snacks with low fiber foods adjusting diet based on tolerance.   Encourage patient to try lactose free foods and monitor indigestion. Try Ensure plus/complete 3 times daily as tolerated. Consider adding resource breeze.  Provided information on purchasing. Contact MD regarding indigestion and Creon intolerance.  Monitoring, evaluation, goals: Patient will tolerate increased calories and protein to minimize weight loss and promote healing.  Next visit: Tuesday, January 18 during infusion.   **Disclaimer: This note was dictated with voice recognition software. Similar sounding words can inadvertently be transcribed and this note may contain transcription errors which may not have been corrected upon publication of note.**

## 2020-05-12 NOTE — Telephone Encounter (Addendum)
Oral Chemotherapy Pharmacist Encounter  I spoke with patient for overview of: Xeloda (capecitabine) for the adjuvant treatment of pancreatic cancer in conjunction with gemcitabine, planned duration 2 months per MD.   Counseled patient on administration, dosing, side effects, monitoring, drug-food interactions, safe handling, storage, and disposal.  Patient will take Xeloda 500mg  tablets, 3 tablets (1500mg ) by mouth in AM and 2 tabs (1000mg ) by mouth in PM, within 30 minutes of finishing meals, on days 1-14 of each 21 day cycle.   Xeloda and gemcitabine start date: 05/19/20  Adverse effects include but are not limited to: fatigue, decreased blood counts, GI upset, diarrhea, mouth sores, and hand-foot syndrome.  Patient has anti-emetic on hand and knows to take it if nausea develops.   Patient will obtain anti diarrheal and alert the office of 4 or more loose stools above baseline.  Reviewed with patient importance of keeping a medication schedule and plan for any missed doses. No barriers to medication adherence identified.  Medication reconciliation performed and medication/allergy list updated. Discussed DDI between Xeloda and pantoprazole and that pantoprazole can decrease the efficacy of Xeloda. Patient stated she will try using famotidine while on Xeloda.   Insurance authorization for Xeloda has been obtained. Patient's insurance requires Xeloda be filled through [employer] This will be delivered to patient's home on 05/12/20.   All questions answered.  Ms. Onofrio voiced understanding and appreciation.   Medication education handout placed in mail for patient. Patient knows to call the office with questions or concerns. Oral Chemotherapy Clinic phone number provided to patient.   Leron Croak, PharmD, BCPS Hematology/Oncology Clinical Pharmacist [facility] [phone] 05/12/2020 2:00 PM

## 2020-05-18 ENCOUNTER — Telehealth: Payer: Self-pay | Admitting: Nurse Practitioner

## 2020-05-18 NOTE — Progress Notes (Signed)
Pharmacist Chemotherapy Monitoring - Initial Assessment    Anticipated start date: 05/19/2020   Regimen:  . Are orders appropriate based on the patient's diagnosis, regimen, and cycle? Yes . Does the plan date match the patient's scheduled date? Yes . Is the sequencing of drugs appropriate? Yes . Are the premedications appropriate for the patient's regimen? Yes . Prior Authorization for treatment is: Approved o If applicable, is the correct biosimilar selected based on the patient's insurance? not applicable  Organ Function and Labs: Marland Kitchen Are dose adjustments needed based on the patient's renal function, hepatic function, or hematologic function? No . Are appropriate labs ordered prior to the start of patient's treatment? Yes . Other organ system assessment, if indicated: N/A . The following baseline labs, if indicated, have been ordered: N/A  Dose Assessment: . Are the drug doses appropriate? Yes . Are the following correct: o Drug concentrations Yes o IV fluid compatible with drug Yes o Administration routes Yes o Timing of therapy Yes . If applicable, does the patient have documented access for treatment and/or plans for port-a-cath placement? yes . If applicable, have lifetime cumulative doses been properly documented and assessed? not applicable Lifetime Dose Tracking  . Oxaliplatin: 650.906 mg/m2 (1,070 mg) = 108.48 % of the maximum lifetime dose of 600 mg/m2  o   Toxicity Monitoring/Prevention: . The patient has the following take home antiemetics prescribed: Ondansetron . The patient has the following take home medications prescribed: N/A . Medication allergies and previous infusion related reactions, if applicable, have been reviewed and addressed. Yes . The patient's current medication list has been assessed for drug-drug interactions with their chemotherapy regimen. no significant drug-drug interactions were identified on review.  Order Review: . Are the treatment plan  orders signed? Yes . Is the patient scheduled to see a provider prior to their treatment? Yes  I verify that I have reviewed each item in the above checklist and answered each question accordingly.  Eddie Candle, PharmD, BCPS PGY-2 Hematology/Oncology Pharmacy Resident  05/18/2020 1:15 PM

## 2020-05-18 NOTE — Progress Notes (Signed)
[name]   [phone] [phone]   Clinic Follow up Note   Patient Care Team: Vernie Shanks, MD as PCP - General (Family Medicine) Fontaine, Belinda Block, MD (Inactive) as Consulting Physician (Gynecology) Truitt Merle, MD as Consulting Physician (Oncology) Jonnie Finner, RN as Registered Nurse Arta Silence, MD as Consulting Physician (Gastroenterology) Stark Klein, MD as Consulting Physician (General Surgery) Karie Mainland, RD as Dietitian (Nutrition) 05/19/2020  CHIEF COMPLAINT: Follow up pancreas cancer   SUMMARY OF ONCOLOGIC HISTORY: Oncology History Overview Note  Cancer Staging Pancreatic cancer [facility]) Staging form: Exocrine Pancreas, AJCC 8th Edition - Clinical stage from 10/16/2019: Stage IIB (cT2, cN1, cM0) - Signed by Truitt Merle, MD on 10/16/2019    Pancreatic cancer ([place])  10/03/2019 Imaging   US Abdomen 10/03/19  IMPRESSION: Markedly dilated bile ducts. Dilated gallbladder with sludge but no gallstones.   2.9 cm mass in the uncinate process most likely pancreatic neoplasm causing biliary obstruction. Recommend MRI of the pancreas without with contrast for further evaluation. If the patient cannot have MRI, CT of the pancreas with contrast recommended.   10/07/2019 Imaging   MRI Abdomen 10/07/19  IMPRESSION: 1. There is a hypoenhancing mass of the pancreatic uncinate with abrupt obstruction of the central common bile duct, measuring approximately 2.9 cm, consistent with pancreatic adenocarcinoma. 2. Gross intra and extrahepatic biliary ductal dilatation, common bile duct measuring 1.4 cm. Gallbladder hydrops. 3. The pancreatic duct is nondilated. 4. Pancreatic mass lies closely adjacent to the most central portions of the superior mesenteric vein and central portal vein, due to motion artifact it is difficult to clearly discern whether there is a preserved fat plane. Multiphasic contrast enhanced pancreatic protocol CT  may be less motion sensitive and better delineate vascular structures for the purposes of surgical planning if necessary. 5. No evidence of lymphadenopathy or metastatic disease in the abdomen.   10/10/2019 Procedure   EUS by Dr Paulita Fujita 10/10/19  IMPRESSION - A mass was identified at junction of head/uncinate process of the pancreas. This was staged T2 N1 Mx by endosonographic criteria. Fine needle aspiration performed. - A few abnormal lymph nodes were visualized in the peripancreatic region. - There was dilation in the common bile duct which measured up to 14 mm. - Hyperechoic material consistent with sludge was visualized endosonographically in the gallbladder.   10/10/2019 Procedure   ERCP by Dr Paulita Fujita 10/10/19  IMPRESSION - The major papilla appeared normal. - One temporary stent was placed into the ventral pancreatic duct. - A biliary sphincterotomy was performed. - One covered metal stent was placed into the common bile duct.   10/10/2019 Initial Biopsy   FINAL MICROSCOPIC DIAGNOSIS: 10/10/19  A. PANCREAS, HEAD, FINE NEEDLE ASPIRATION:  - Malignant cells present, consistent with adenocarcinoma    10/16/2019 Initial Diagnosis   Pancreatic cancer ([place])   10/16/2019 Cancer Staging   Staging form: Exocrine Pancreas, AJCC 8th Edition - Clinical stage from 10/16/2019: Stage IIB (cT2, cN1, cM0) - Signed by Truitt Merle, MD on 10/16/2019   10/23/2019 Imaging   CT CAP W contrast  IMPRESSION: 1. Pancreatic mass with local adenopathy and potential retroperitoneal adenopathy. Given retroperitoneal lymph nodes and presence of venous collaterals PET scan may be useful for staging purposes. 2. No definite vascular involvement or signs of upper abdominal venous collaterals. Some stranding around the celiac may be due to recent biopsy. 3. Borderline enlarged RIGHT external iliac lymph node. Attention on follow-up. 4. No evidence of metastatic disease in  the chest. 5. Signs of recent ERCP and  stent placement.   10/25/2019 Procedure   PAC placed    11/09/2019 - 02/20/2020 Chemotherapy   Neoadjuvant FOLFIRINOX q2weeks starting 11/09/19-02/20/20 for 8 cycles   11/28/2019 Genetic Testing   Negative genetic testing on the common hereditary cancer panel, pancreatic cancer panel, preliminary evidence pancreatic cancer panel and chronic pancreatitis panel.  The Common Hereditary Gene Panel offered by [facility] includes sequencing and/or deletion duplication testing of the following 55 genes: APC*, ATM*, AXIN2, BARD1, BMPR1A, BRCA1, BRCA2, BRIP1, CASR, CDH1, CDK4, CDKN2A (p14ARF), CDKN2A (p16INK4a), CFTR*, CHEK2, CPA1, CTNNA1, CTRC, DICER1*, EPCAM*, FANCC, GREM1*, HOXB13, KIT, MEN1*, MLH1*, MSH2*, MSH3*, MSH6*, MUTYH, NBN, NF1*, NTHL1, PALB2, PALLD, PDGFRA, PMS2*, POLD1*, POLE, PRSS1*, PTEN*, RAD50, RAD51C, RAD51D, SDHA*, SDHB, SDHC*, SDHD, SMAD4, SMARCA4, SPINK1, STK11, TP53, TSC1*, TSC2, and VHL .  The report date is November 28, 2019.   01/28/2020 Imaging   CT AP w contrast  IMPRESSION: 1. No substantial interval change in exam. 2. Similar appearance of the pancreatic mass with associated main pancreatic ductal dilatation. 3. Soft tissue stranding again noted around the celiac axis, in the para-aortic retroperitoneal space and along the IMA (new in the interval). These findings are associated with similar appearance of upper normal to borderline hepatoduodenal ligament and retroperitoneal lymphadenopathy. Close continued attention on follow-up recommended. 4. Borderline enlarged right external iliac node identified on previous study has decreased in size in the interval.   02/14/2020 PET scan   IMPRESSION: 1. The pancreatic mass has a maximum SUV of 6.1. No findings of hypermetabolic local adenopathy or distant metastatic spread. 2. Splenomegaly. 3. Inspissated barium or appendicolith within the appendix. No signs of appendiceal inflammation. 4. Scattered sigmoid colon  diverticula. ADDENDUM: The original report was by Dr. Van Clines. The following addendum is by Dr. Van Clines:   On the CT from 01/28/2020 there were some upper normal sized/borderline retroperitoneal lymph nodes including a 0.8 cm aortocaval node on image 72 of series 9. This currently has a maximum SUV of 2.2, which is similar to blood pool activity. An indistinctly marginated lymph node just below the left renal vein has a maximum SUV of 2.4, just above blood pool levels., and measures about 1.0 cm in diameter. Lymph nodes anterior to the left psoas muscle have maximum SUV of 2.0, similar to blood pool. Overall these lymph nodes are borderline enlarged and with activity level at or just minimally above blood pool. While certainly not grossly positive, their proximity to the pancreatic head mass and borderline imaging characteristics make it difficult to exclude early nodal involvement by malignancy.   03/24/2020 Surgery   Whipple surgery, Laparoscopic Diagnostic, Appendectomy by Dr Barry Dienes    03/24/2020 Pathology Results   FINAL MICROSCOPIC DIAGNOSIS:   A. WHIPPLE PROCEDURE, WITH GALLBLADDER:  - Adenocarcinoma, moderately differentiated, spanning 1 cm.  - Tumor involves retroperitoneal peripancreatic soft tissue.  - Resection margins are negative.  - Treatment effect present.  - Rare tumor cells in one of fourteen lymph nodes (1/14).  - Benign gallbladder with chronic inflammation and benign lymph node.  - Bile duct with granulation tissue and reactive changes.  - Benign appendix.  - Mild reactive gastropathy.  - See oncology table.   B. LYMPH NODE, PORTAL, EXCISION:  - Three of three lymph nodes negative for carcinoma (0/3).   C. LYMPH NODE, COMMON HEPATIC ARTERY, EXCISION:  - Three of three lymph nodes negative for carcinoma (0/3).   D. APPENDIX, APPENDECTOMY:  - Benign appendix.  E. LYMPH NODE, AORTOCAVAL, EXCISION:  - One of one lymph node negative  for carcinoma (0/1).    03/24/2020 Cancer Staging   Staging form: Exocrine Pancreas, AJCC 8th Edition - Pathologic stage from 03/24/2020: Stage IIB (pT1c, pN1, cM0) - Signed by Truitt Merle, MD on 04/16/2020   04/14/2020 Imaging   CT AP  IMPRESSION: 1. Interval Whipple procedure. Pancreatic stent in place. 2. Ill-defined soft tissue in the right abdominal wall extending from the subcutaneous tissues to the anterior abdominal wall musculature, but no focal fluid collection. This may represent postsurgical change, sterility indeterminate by imaging. 3. Pelvic anatomy is poorly defined, including pelvic bowel loops, adnexa, and colon. There appears to be sigmoid colonic wall thickening as well as intraluminal fluid, with additional diffuse colonic wall thickening from the transverse colon distally. Findings are suggestive of generalized colitis. Attention to this area on follow-up is recommended. 4. Small amount of non organized free fluid in the right upper quadrant. No evidence of intra-abdominal abscess. 5. Periportal edema, nonspecific. Previous biliary stent has been removed. Common bile duct is not well-defined on the current exam. 6. Mild splenomegaly. 7. Trace right pleural effusion.   05/19/2020 -  Chemotherapy    Patient is on Treatment Plan: PANCREAS GEMCITABINE D1,8,15 + CAPECITABINE D1-21 Q28D X 4 CYCLES        CURRENT THERAPY: Pending adjuvant chemo gemcitabine and Xeloda, starting 05/18/2020  INTERVAL HISTORY: Ms. Kathman returns for follow up and to start treatment as scheduled, she began xeloda 1500 mg this morning.  Her energy and appetite continue to improve, some days better than others.  She notes her energy is about 50-60% pre-surgery.  She had severe abdominal cramping and diarrhea after taking 2 doses of Creon and stopped it.  Her cramping has improved, now only if she over eats.  Had a loose stool this morning, otherwise normal.  Denies any fever, chills, cough,  chest pain, dyspnea, leg edema, or residual neuropathy from previous chemo.    MEDICAL HISTORY:  Past Medical History:  Diagnosis Date  . Cancer [facility])    pancreatic  . Cataracts, bilateral   . Diverticulosis 12/07/2001   found on colonoscopy  . Family history of colon cancer   . Family history of pancreatic cancer   . Hypothyroid 10/2007  . Macular hole   . Pancreatic mass   . PONV (postoperative nausea and vomiting)     SURGICAL HISTORY: Past Surgical History:  Procedure Laterality Date  . APPENDECTOMY N/A 03/24/2020   Procedure: APPENDECTOMY;  Surgeon: Stark Klein, MD;  Location: [place];  Service: General;  Laterality: N/A;  . BILIARY STENT PLACEMENT  10/10/2019   Procedure: BILIARY STENT PLACEMENT;  Surgeon: Arta Silence, MD;  Location: [place];  Service: Endoscopy;;  . CATARACT EXTRACTION, BILATERAL    . COLONOSCOPY    . ERCP N/A 10/10/2019   Procedure: ENDOSCOPIC RETROGRADE CHOLANGIOPANCREATOGRAPHY (ERCP);  Surgeon: Arta Silence, MD;  Location[facility] ENDOSCOPY;  Service: Endoscopy;  Laterality: N/A;  . ESOPHAGOGASTRODUODENOSCOPY N/A 10/10/2019   Procedure: ESOPHAGOGASTRODUODENOSCOPY (EGD);  Surgeon: Arta Silence, MD;  Location: [facility] ENDOSCOPY;  Service: Endoscopy;  Laterality: N/A;  . EUS N/A 10/10/2019   Procedure: UPPER ENDOSCOPIC ULTRASOUND (EUS) LINEAR;  Surgeon: Arta Silence, MD;  Location: [place];  Service: Endoscopy;  Laterality: N/A;  . EYE SURGERY    . FINE NEEDLE ASPIRATION  10/10/2019   Procedure: FINE NEEDLE ASPIRATION (FNA) LINEAR;  Surgeon: Arta Silence, MD;  Location: [place] ENDOSCOPY;  Service: Endoscopy;;  . IR  IMAGING GUIDED PORT INSERTION  10/25/2019  . LAPAROSCOPY N/A 03/24/2020   Procedure: LAPAROSCOPY DIAGNOSTIC;  Surgeon: Stark Klein, MD;  Location: [place];  Service: General;  Laterality: N/A;  . Macular hole surg    . PANCREATIC STENT PLACEMENT  10/10/2019   Procedure: PANCREATIC STENT PLACEMENT;  Surgeon: Arta Silence, MD;   Location: [facility] ENDOSCOPY;  Service: Endoscopy;;  . SPHINCTEROTOMY  10/10/2019   Procedure: Joan Mayans;  Surgeon: Arta Silence, MD;  Location: [facility] ENDOSCOPY;  Service: Endoscopy;;  . TONSILLECTOMY    . TONSILLECTOMY    . WHIPPLE PROCEDURE N/A 03/24/2020   Procedure: WHIPPLE PROCEDURE;  Surgeon: Stark Klein, MD;  Location: [place];  Service: General;  Laterality: N/A;    I have reviewed the social history and family history with the patient and they are unchanged from previous note.  ALLERGIES:  has No Known Allergies.  MEDICATIONS:  Current Outpatient Medications  Medication Sig Dispense Refill  . Ascorbic Acid (VITAMIN C PO) Take 1,000 mg by mouth daily.     . B Complex Vitamins (B-COMPLEX/B-12 PO) Take by mouth. (Patient not taking: Reported on 03/17/2020)    . Calcium Carb-Cholecalciferol (CALCIUM 1000 + D PO) Take 1 tablet by mouth daily.     . capecitabine (XELODA) 500 MG tablet Take 3 tablets (1500 mg total) by mouth in morning and 2 tabs (1000 mg total) in evening. Take 14 days on, 7 days off, repeat every 21 days. Taken within 30 minutes of meals. 70 tablet 2  . diphenhydrAMINE (BENADRYL) 25 MG tablet Take 25 mg by mouth at bedtime.    . ferrous sulfate 325 (65 FE) MG tablet Take 325 mg by mouth daily with breakfast.    . ibuprofen (ADVIL) 200 MG tablet Take 400 mg by mouth every 6 (six) hours as needed for moderate pain.    Marland Kitchen levothyroxine (SYNTHROID, LEVOTHROID) 50 MCG tablet Take 1 tablet (50 mcg total) by mouth daily. 90 tablet 4  . lipase/protease/amylase (CREON) 36000 UNITS CPEP capsule Take 1 capsule (36,000 Units total) by mouth 3 (three) times daily with meals. May also take 1 capsule (36,000 Units total) as needed (with snacks). 50 capsule 0  . LORazepam (ATIVAN) 0.5 MG tablet Take 1 tablet (0.5 mg total) by mouth daily as needed for anxiety. For anticipatory n/v prior to chemo treatments (Patient not taking: Reported on 03/17/2020) 15 tablet 0  . Multiple Vitamin  (MULTIVITAMIN PO) Take 1 tablet by mouth daily.     . mupirocin ointment (BACTROBAN) 2 % Place 1 application into the nose daily as needed (sore in nose). (Patient not taking: Reported on 03/17/2020)    . ondansetron (ZOFRAN) 4 MG tablet Take 1 tablet (4 mg total) by mouth every 8 (eight) hours as needed. 30 tablet 2  . oxyCODONE (OXY IR/ROXICODONE) 5 MG immediate release tablet Take 1 tablet (5 mg total) by mouth every 6 (six) hours as needed for moderate pain. 30 tablet 0  . pantoprazole (PROTONIX) 40 MG tablet Take 1 tablet (40 mg total) by mouth daily with supper. 30 tablet 1  . prochlorperazine (COMPAZINE) 10 MG tablet TAKE 1 TABLET BY MOUTH EVERY 6 HOURS AS NEEDED FOR NAUSEA AND VOMITING 30 tablet 2  . vitamin B-12 (CYANOCOBALAMIN) 1000 MCG tablet Take 1,000 mcg by mouth daily.     No current facility-administered medications for this visit.    PHYSICAL EXAMINATION: ECOG PERFORMANCE STATUS: 2-3   Vitals:   05/19/20 1418  BP: 112/69  Pulse: 91  Resp:  15  Temp: 97.6 F (36.4 C)  SpO2: 100%   Filed Weights   05/19/20 1418  Weight: 108 lb 14.4 oz (49.4 kg)    GENERAL:alert, no distress and comfortable SKIN: No rash EYES: sclera clear NECK: Without mass LUNGS: clear with normal breathing effort HEART: regular rate & rhythm, no lower extremity edema ABDOMEN:abdomen soft, non-tender and normal bowel sounds. midline incision completely healed NEURO: alert & oriented x 3 with fluent speech, no focal motor/sensory deficits PAC without erythema   LABORATORY DATA:  I have reviewed the data as listed CBC Latest Ref Rng & Units 05/19/2020 05/07/2020 04/16/2020  WBC 4.0 - 10.5 K/uL 4.4 4.6 23.6(H)  Hemoglobin 12.0 - 15.0 g/dL 10.4(L) 10.3(L) 9.0(L)  Hematocrit 36.0 - 46.0 % 32.7(L) 33.5(L) 28.1(L)  Platelets 150 - 400 K/uL 115(L) 146(L) 466(H)     CMP Latest Ref Rng & Units 05/19/2020 05/07/2020 04/16/2020  Glucose 70 - 99 mg/dL 138(H) 125(H) 125(H)  BUN 6 - 20 mg/dL 10 5(L) 6   Creatinine 0.44 - 1.00 mg/dL 0.80 0.57 0.52  Sodium 135 - 145 mmol/L 137 137 131(L)  Potassium 3.5 - 5.1 mmol/L 3.8 3.7 3.5  Chloride 98 - 111 mmol/L 101 103 92(L)  CO2 22 - 32 mmol/L 27 27 28   Calcium 8.9 - 10.3 mg/dL 8.6(L) 8.9 8.8(L)  Total Protein 6.5 - 8.1 g/dL 7.1 7.5 7.3  Total Bilirubin 0.3 - 1.2 mg/dL 0.3 0.4 0.4  Alkaline Phos 38 - 126 U/L 137(H) 124 160(H)  AST 15 - 41 U/L 36 38 23  ALT 0 - 44 U/L 24 21 15       RADIOGRAPHIC STUDIES: I have personally reviewed the radiological images as listed and agreed with the findings in the report. No results found.   ASSESSMENT & PLAN: Danah Reinecke Brimis a 60 y.o.caucasianfemalewith a history of Cataracts, Diverticulosis, Hypothyroidism  1. Pancreatic Cancer, Stage IIB, T2N1M0 -Diagnosed 10/2019.  Her US showed bile duct and gallbladder dilation due to 2-3cm mass at head of pancreas/Uncinate. Her MRI showed her mass is laying close to SMV but not involving it. There is no evidence of lymphadenopathy or metastatic disease in the abdomenon MRI. -She underwent EUS and ERCP on 10/10/19 and had 2 stents placed by Dr Paulita Fujita. Herpancreatic mass biopsyshowed Adenocarcinoma of Pancreas.Endoscopic staging T2N1, noevidence of vascular involvement. -Given her young age and good performance status she completed 8 cycles of neoadjuvant FOLFIRINOX from 11/09/2019 - 02/20/2020, tolerated moderately well  -S/p Whipple surgery with Dr. Barry Dienes on 03/24/2020, path showed a residual 1 cm moderately differentiated adenocarcinoma in 1/14 positive lymph nodes, margins negative -Dr. Burr Medico recommended 2 months adjuvant chemo with gemcitabine and Xeloda to reduce her high risk of recurrence  2. Upper Abdominal Pain, Mild weight loss  -Presented with initial jaundice but since stent placement her pain has much improved. -Pain resolved on chemo -post-op pain is more cramping, especially if she overeats. Worsened by creon, stopped after 2 doses  -f/up  dietician today. I recommend starting supplements.   3. Genetic Testing -negative result 11/28/19  Disposition: Ms. Kloos appears stable.  She continues to recover from surgery, still with mild to moderate residual fatigue.  She is otherwise doing well.    Labs reviewed, adequate to proceed with cycle 1 adjuvant chemo today.  Due to her residual fatigue we will proceed with single agent gemcitabine today, she will hold Xeloda this week.   She will return for follow-up and day 8 gemcitabine on 1/24, if she has  tolerated very well she can start Xeloda for a week on then a week off. The plan was reviewed with Dr. Burr Medico.  All questions were answered. The patient knows to call the clinic with any problems, questions or concerns. No barriers to learning were detected.     Alla Feeling, NP 05/19/20

## 2020-05-18 NOTE — Telephone Encounter (Signed)
R/s appointments due to weather. Spoke to patient who is aware of updated appointments times.

## 2020-05-19 ENCOUNTER — Other Ambulatory Visit: Payer: Self-pay

## 2020-05-19 ENCOUNTER — Inpatient Hospital Stay: Payer: BC Managed Care – PPO | Admitting: Nutrition

## 2020-05-19 ENCOUNTER — Inpatient Hospital Stay: Payer: BC Managed Care – PPO | Admitting: Nurse Practitioner

## 2020-05-19 ENCOUNTER — Inpatient Hospital Stay: Payer: BC Managed Care – PPO

## 2020-05-19 ENCOUNTER — Encounter: Payer: Self-pay | Admitting: Nurse Practitioner

## 2020-05-19 DIAGNOSIS — C250 Malignant neoplasm of head of pancreas: Secondary | ICD-10-CM

## 2020-05-19 DIAGNOSIS — Z95828 Presence of other vascular implants and grafts: Secondary | ICD-10-CM

## 2020-05-19 DIAGNOSIS — Z452 Encounter for adjustment and management of vascular access device: Secondary | ICD-10-CM | POA: Diagnosis not present

## 2020-05-19 DIAGNOSIS — D61810 Antineoplastic chemotherapy induced pancytopenia: Secondary | ICD-10-CM | POA: Diagnosis not present

## 2020-05-19 DIAGNOSIS — Z5111 Encounter for antineoplastic chemotherapy: Secondary | ICD-10-CM | POA: Diagnosis not present

## 2020-05-19 DIAGNOSIS — C257 Malignant neoplasm of other parts of pancreas: Secondary | ICD-10-CM | POA: Diagnosis not present

## 2020-05-19 LAB — CBC WITH DIFFERENTIAL (CANCER CENTER ONLY)
Abs Immature Granulocytes: 0 10*3/uL (ref 0.00–0.07)
Basophils Absolute: 0.1 10*3/uL (ref 0.0–0.1)
Basophils Relative: 1 %
Eosinophils Absolute: 0.1 10*3/uL (ref 0.0–0.5)
Eosinophils Relative: 3 %
HCT: 32.7 % — ABNORMAL LOW (ref 36.0–46.0)
Hemoglobin: 10.4 g/dL — ABNORMAL LOW (ref 12.0–15.0)
Immature Granulocytes: 0 %
Lymphocytes Relative: 38 %
Lymphs Abs: 1.7 10*3/uL (ref 0.7–4.0)
MCH: 27.4 pg (ref 26.0–34.0)
MCHC: 31.8 g/dL (ref 30.0–36.0)
MCV: 86.1 fL (ref 80.0–100.0)
Monocytes Absolute: 0.6 10*3/uL (ref 0.1–1.0)
Monocytes Relative: 13 %
Neutro Abs: 2 10*3/uL (ref 1.7–7.7)
Neutrophils Relative %: 45 %
Platelet Count: 115 10*3/uL — ABNORMAL LOW (ref 150–400)
RBC: 3.8 MIL/uL — ABNORMAL LOW (ref 3.87–5.11)
RDW: 17 % — ABNORMAL HIGH (ref 11.5–15.5)
WBC Count: 4.4 10*3/uL (ref 4.0–10.5)
nRBC: 0 % (ref 0.0–0.2)

## 2020-05-19 LAB — CMP (CANCER CENTER ONLY)
ALT: 24 U/L (ref 0–44)
AST: 36 U/L (ref 15–41)
Albumin: 3.2 g/dL — ABNORMAL LOW (ref 3.5–5.0)
Alkaline Phosphatase: 137 U/L — ABNORMAL HIGH (ref 38–126)
Anion gap: 9 (ref 5–15)
BUN: 10 mg/dL (ref 6–20)
CO2: 27 mmol/L (ref 22–32)
Calcium: 8.6 mg/dL — ABNORMAL LOW (ref 8.9–10.3)
Chloride: 101 mmol/L (ref 98–111)
Creatinine: 0.8 mg/dL (ref 0.44–1.00)
GFR, Estimated: >60 mL/min (ref >60)
Glucose, Bld: 138 mg/dL — ABNORMAL HIGH (ref 70–99)
Potassium: 3.8 mmol/L (ref 3.5–5.1)
Sodium: 137 mmol/L (ref 135–145)
Total Bilirubin: 0.3 mg/dL (ref 0.3–1.2)
Total Protein: 7.1 g/dL (ref 6.5–8.1)

## 2020-05-19 MED ORDER — SODIUM CHLORIDE 0.9 % IV SOLN
1000.0000 mg/m2 | Freq: Once | INTRAVENOUS | Status: AC
  Administered 2020-05-19: 1520 mg via INTRAVENOUS
  Filled 2020-05-19: qty 39.98

## 2020-05-19 MED ORDER — ONDANSETRON HCL 4 MG PO TABS: 4.0000 mg | ORAL_TABLET | Freq: Three times a day (TID) | ORAL | 2 refills | Status: DC | PRN

## 2020-05-19 MED ORDER — PROCHLORPERAZINE MALEATE 10 MG PO TABS
ORAL_TABLET | ORAL | Status: AC
  Filled 2020-05-19: qty 1

## 2020-05-19 MED ORDER — HEPARIN SOD (PORK) LOCK FLUSH 100 UNIT/ML IV SOLN
500.0000 [IU] | Freq: Once | INTRAVENOUS | Status: AC | PRN
  Administered 2020-05-19: 500 [IU]
  Filled 2020-05-19: qty 5

## 2020-05-19 MED ORDER — SODIUM CHLORIDE 0.9 % IV SOLN
Freq: Once | INTRAVENOUS | Status: AC
  Filled 2020-05-19: qty 250

## 2020-05-19 MED ORDER — SODIUM CHLORIDE 0.9% FLUSH
10.0000 mL | Freq: Once | INTRAVENOUS | Status: AC
  Administered 2020-05-19: 10 mL
  Filled 2020-05-19: qty 10

## 2020-05-19 MED ORDER — PROCHLORPERAZINE MALEATE 10 MG PO TABS: ORAL_TABLET | ORAL | 2 refills | Status: DC

## 2020-05-19 MED ORDER — PROCHLORPERAZINE MALEATE 10 MG PO TABS
10.0000 mg | ORAL_TABLET | Freq: Once | ORAL | Status: AC
  Administered 2020-05-19: 10 mg via ORAL

## 2020-05-19 MED ORDER — SODIUM CHLORIDE 0.9% FLUSH
10.0000 mL | INTRAVENOUS | Status: DC | PRN
  Administered 2020-05-19: 10 mL
  Filled 2020-05-19: qty 10

## 2020-05-19 NOTE — Patient Instructions (Signed)
Gemcitabine injection What is this medicine? GEMCITABINE (jem SYE ta been) is a chemotherapy drug. This medicine is used to treat many types of cancer like breast cancer, lung cancer, pancreatic cancer, and ovarian cancer. This medicine may be used for other purposes; ask your health care provider or pharmacist if you have questions. COMMON BRAND NAME(S): Gemzar, Infugem What should I tell my health care provider before I take this medicine? They need to know if you have any of these conditions:  blood disorders  infection  kidney disease  liver disease  lung or breathing disease, like asthma  recent or ongoing radiation therapy  an unusual or allergic reaction to gemcitabine, other chemotherapy, other medicines, foods, dyes, or preservatives  pregnant or trying to get pregnant  breast-feeding How should I use this medicine? This drug is given as an infusion into a vein. It is administered in a hospital or clinic by a specially trained health care professional. Talk to your pediatrician regarding the use of this medicine in children. Special care may be needed. Overdosage: If you think you have taken too much of this medicine contact a poison control center or emergency room at once. NOTE: This medicine is only for you. Do not share this medicine with others. What if I miss a dose? It is important not to miss your dose. Call your doctor or health care professional if you are unable to keep an appointment. What may interact with this medicine?  medicines to increase blood counts like filgrastim, pegfilgrastim, sargramostim  some other chemotherapy drugs like cisplatin  vaccines Talk to your doctor or health care professional before taking any of these medicines:  acetaminophen  aspirin  ibuprofen  ketoprofen  naproxen This list may not describe all possible interactions. Give your health care provider a list of all the medicines, herbs, non-prescription drugs, or  dietary supplements you use. Also tell them if you smoke, drink alcohol, or use illegal drugs. Some items may interact with your medicine. What should I watch for while using this medicine? Visit your doctor for checks on your progress. This drug may make you feel generally unwell. This is not uncommon, as chemotherapy can affect healthy cells as well as cancer cells. Report any side effects. Continue your course of treatment even though you feel ill unless your doctor tells you to stop. In some cases, you may be given additional medicines to help with side effects. Follow all directions for their use. Call your doctor or health care professional for advice if you get a fever, chills or sore throat, or other symptoms of a cold or flu. Do not treat yourself. This drug decreases your body's ability to fight infections. Try to avoid being around people who are sick. This medicine may increase your risk to bruise or bleed. Call your doctor or health care professional if you notice any unusual bleeding. Be careful brushing and flossing your teeth or using a toothpick because you may get an infection or bleed more easily. If you have any dental work done, tell your dentist you are receiving this medicine. Avoid taking products that contain aspirin, acetaminophen, ibuprofen, naproxen, or ketoprofen unless instructed by your doctor. These medicines may hide a fever. Do not become pregnant while taking this medicine or for 6 months after stopping it. Women should inform their doctor if they wish to become pregnant or think they might be pregnant. Men should not father a child while taking this medicine and for 3 months after stopping it.   There is a potential for serious side effects to an unborn child. Talk to your health care professional or pharmacist for more information. Do not breast-feed an infant while taking this medicine or for at least 1 week after stopping it. Men should inform their doctors if they wish  to father a child. This medicine may lower sperm counts. Talk with your doctor or health care professional if you are concerned about your fertility. What side effects may I notice from receiving this medicine? Side effects that you should report to your doctor or health care professional as soon as possible:  allergic reactions like skin rash, itching or hives, swelling of the face, lips, or tongue  breathing problems  pain, redness, or irritation at site where injected  signs and symptoms of a dangerous change in heartbeat or heart rhythm like chest pain; dizziness; fast or irregular heartbeat; palpitations; feeling faint or lightheaded, falls; breathing problems  signs of decreased platelets or bleeding - bruising, pinpoint red spots on the skin, black, tarry stools, blood in the urine  signs of decreased red blood cells - unusually weak or tired, feeling faint or lightheaded, falls  signs of infection - fever or chills, cough, sore throat, pain or difficulty passing urine  signs and symptoms of kidney injury like trouble passing urine or change in the amount of urine  signs and symptoms of liver injury like dark yellow or brown urine; general ill feeling or flu-like symptoms; light-colored stools; loss of appetite; nausea; right upper belly pain; unusually weak or tired; yellowing of the eyes or skin  swelling of ankles, feet, hands Side effects that usually do not require medical attention (report to your doctor or health care professional if they continue or are bothersome):  constipation  diarrhea  hair loss  loss of appetite  nausea  rash  vomiting This list may not describe all possible side effects. Call your doctor for medical advice about side effects. You may report side effects to FDA at 1-800-FDA-1088. Where should I keep my medicine? This drug is given in a hospital or clinic and will not be stored at home. NOTE: This sheet is a summary. It may not cover all  possible information. If you have questions about this medicine, talk to your doctor, pharmacist, or health care provider.  2021 [employer] (2017-07-12 18:06:11)  

## 2020-05-19 NOTE — Progress Notes (Signed)
Nutrition follow-up completed with patient during infusion for pancreas cancer. Patient is status post Whipple on November 23. Weight decreased and documented as 108.9 pounds on January 18, decreased from 109.4 pounds January 6. Patient has started Xeloda. She reports her energy and appetite are increasing.  She has discontinued Creon. She is drinking 1 Ensure every day but does not tolerate chocolate flavor. Reports trying to find motivation to get on her exercise bike.  Nutrition diagnosis: Severe malnutrition continues.  Intervention: Provided support and encouragement for patient to continue strategies for increased calories and protein. Reviewed ways to tolerate Ensure.  Encouraged her to try to increase to twice daily. Reviewed strategies for increasing overall calories. Questions were answered.  Teach back method used.  Monitoring, evaluation, goals: Patient will tolerate increased calories and protein to minimize weight loss.  Next visit: Wednesday, March 2 during infusion.  **Disclaimer: This note was dictated with voice recognition software. Similar sounding words can inadvertently be transcribed and this note may contain transcription errors which may not have been corrected upon publication of note.**

## 2020-05-20 ENCOUNTER — Telehealth: Payer: Self-pay | Admitting: *Deleted

## 2020-05-20 ENCOUNTER — Telehealth: Payer: Self-pay | Admitting: Nurse Practitioner

## 2020-05-20 NOTE — Telephone Encounter (Signed)
Scheduled appointments per 11/8 los. Spoke to patient who is aware of appointments dates and times.  

## 2020-05-22 NOTE — Progress Notes (Signed)
[place]   [phone] Fax:([phone]) [phone]   Clinic Follow up Note   Patient Care Team: Vernie Shanks, MD as PCP - General (Family Medicine) Fontaine, Belinda Block, MD (Inactive) as Consulting Physician (Gynecology) Truitt Merle, MD as Consulting Physician (Oncology) Jonnie Finner, RN as Registered Nurse Arta Silence, MD as Consulting Physician (Gastroenterology) Stark Klein, MD as Consulting Physician (General Surgery) Karie Mainland, RD as Dietitian (Nutrition)  Date of Service:  05/25/2020  CHIEF COMPLAINT: F/u of Pancreatic Cancer  SUMMARY OF ONCOLOGIC HISTORY: Oncology History Overview Note  Cancer Staging Pancreatic cancer [facility]) Staging form: Exocrine Pancreas, AJCC 8th Edition - Clinical stage from 10/16/2019: Stage IIB (cT2, cN1, cM0) - Signed by Truitt Merle, MD on 10/16/2019    Pancreatic cancer ([place])  10/03/2019 Imaging   US Abdomen 10/03/19  IMPRESSION: Markedly dilated bile ducts. Dilated gallbladder with sludge but no gallstones.   2.9 cm mass in the uncinate process most likely pancreatic neoplasm causing biliary obstruction. Recommend MRI of the pancreas without with contrast for further evaluation. If the patient cannot have MRI, CT of the pancreas with contrast recommended.   10/07/2019 Imaging   MRI Abdomen 10/07/19  IMPRESSION: 1. There is a hypoenhancing mass of the pancreatic uncinate with abrupt obstruction of the central common bile duct, measuring approximately 2.9 cm, consistent with pancreatic adenocarcinoma. 2. Gross intra and extrahepatic biliary ductal dilatation, common bile duct measuring 1.4 cm. Gallbladder hydrops. 3. The pancreatic duct is nondilated. 4. Pancreatic mass lies closely adjacent to the most central portions of the superior mesenteric vein and central portal vein, due to motion artifact it is difficult to clearly discern whether there is a preserved fat plane. Multiphasic contrast  enhanced pancreatic protocol CT may be less motion sensitive and better delineate vascular structures for the purposes of surgical planning if necessary. 5. No evidence of lymphadenopathy or metastatic disease in the abdomen.   10/10/2019 Procedure   EUS by Dr Paulita Fujita 10/10/19  IMPRESSION - A mass was identified at junction of head/uncinate process of the pancreas. This was staged T2 N1 Mx by endosonographic criteria. Fine needle aspiration performed. - A few abnormal lymph nodes were visualized in the peripancreatic region. - There was dilation in the common bile duct which measured up to 14 mm. - Hyperechoic material consistent with sludge was visualized endosonographically in the gallbladder.   10/10/2019 Procedure   ERCP by Dr Paulita Fujita 10/10/19  IMPRESSION - The major papilla appeared normal. - One temporary stent was placed into the ventral pancreatic duct. - A biliary sphincterotomy was performed. - One covered metal stent was placed into the common bile duct.   10/10/2019 Initial Biopsy   FINAL MICROSCOPIC DIAGNOSIS: 10/10/19  A. PANCREAS, HEAD, FINE NEEDLE ASPIRATION:  - Malignant cells present, consistent with adenocarcinoma    10/16/2019 Initial Diagnosis   Pancreatic cancer ([place])   10/16/2019 Cancer Staging   Staging form: Exocrine Pancreas, AJCC 8th Edition - Clinical stage from 10/16/2019: Stage IIB (cT2, cN1, cM0) - Signed by Truitt Merle, MD on 10/16/2019   10/23/2019 Imaging   CT CAP W contrast  IMPRESSION: 1. Pancreatic mass with local adenopathy and potential retroperitoneal adenopathy. Given retroperitoneal lymph nodes and presence of venous collaterals PET scan may be useful for staging purposes. 2. No definite vascular involvement or signs of upper abdominal venous collaterals. Some stranding around the celiac may be due to recent biopsy. 3. Borderline enlarged RIGHT external iliac lymph node. Attention on follow-up. 4. No evidence  of metastatic disease in the  chest. 5. Signs of recent ERCP and stent placement.   10/25/2019 Procedure   PAC placed    11/09/2019 - 02/20/2020 Chemotherapy   Neoadjuvant FOLFIRINOX q2weeks starting 11/09/19-02/20/20 for 8 cycles   11/28/2019 Genetic Testing   Negative genetic testing on the common hereditary cancer panel, pancreatic cancer panel, preliminary evidence pancreatic cancer panel and chronic pancreatitis panel.  The Common Hereditary Gene Panel offered by [facility] includes sequencing and/or deletion duplication testing of the following 55 genes: APC*, ATM*, AXIN2, BARD1, BMPR1A, BRCA1, BRCA2, BRIP1, CASR, CDH1, CDK4, CDKN2A (p14ARF), CDKN2A (p16INK4a), CFTR*, CHEK2, CPA1, CTNNA1, CTRC, DICER1*, EPCAM*, FANCC, GREM1*, HOXB13, KIT, MEN1*, MLH1*, MSH2*, MSH3*, MSH6*, MUTYH, NBN, NF1*, NTHL1, PALB2, PALLD, PDGFRA, PMS2*, POLD1*, POLE, PRSS1*, PTEN*, RAD50, RAD51C, RAD51D, SDHA*, SDHB, SDHC*, SDHD, SMAD4, SMARCA4, SPINK1, STK11, TP53, TSC1*, TSC2, and VHL .  The report date is November 28, 2019.   01/28/2020 Imaging   CT AP w contrast  IMPRESSION: 1. No substantial interval change in exam. 2. Similar appearance of the pancreatic mass with associated main pancreatic ductal dilatation. 3. Soft tissue stranding again noted around the celiac axis, in the para-aortic retroperitoneal space and along the IMA (new in the interval). These findings are associated with similar appearance of upper normal to borderline hepatoduodenal ligament and retroperitoneal lymphadenopathy. Close continued attention on follow-up recommended. 4. Borderline enlarged right external iliac node identified on previous study has decreased in size in the interval.   02/14/2020 PET scan   IMPRESSION: 1. The pancreatic mass has a maximum SUV of 6.1. No findings of hypermetabolic local adenopathy or distant metastatic spread. 2. Splenomegaly. 3. Inspissated barium or appendicolith within the appendix. No signs of appendiceal inflammation. 4.  Scattered sigmoid colon diverticula. ADDENDUM: The original report was by Dr. Van Clines. The following addendum is by Dr. Van Clines:   On the CT from 01/28/2020 there were some upper normal sized/borderline retroperitoneal lymph nodes including a 0.8 cm aortocaval node on image 72 of series 9. This currently has a maximum SUV of 2.2, which is similar to blood pool activity. An indistinctly marginated lymph node just below the left renal vein has a maximum SUV of 2.4, just above blood pool levels., and measures about 1.0 cm in diameter. Lymph nodes anterior to the left psoas muscle have maximum SUV of 2.0, similar to blood pool. Overall these lymph nodes are borderline enlarged and with activity level at or just minimally above blood pool. While certainly not grossly positive, their proximity to the pancreatic head mass and borderline imaging characteristics make it difficult to exclude early nodal involvement by malignancy.   03/24/2020 Surgery   Whipple surgery, Laparoscopic Diagnostic, Appendectomy by Dr Barry Dienes    03/24/2020 Pathology Results   FINAL MICROSCOPIC DIAGNOSIS:   A. WHIPPLE PROCEDURE, WITH GALLBLADDER:  - Adenocarcinoma, moderately differentiated, spanning 1 cm.  - Tumor involves retroperitoneal peripancreatic soft tissue.  - Resection margins are negative.  - Treatment effect present.  - Rare tumor cells in one of fourteen lymph nodes (1/14).  - Benign gallbladder with chronic inflammation and benign lymph node.  - Bile duct with granulation tissue and reactive changes.  - Benign appendix.  - Mild reactive gastropathy.  - See oncology table.   B. LYMPH NODE, PORTAL, EXCISION:  - Three of three lymph nodes negative for carcinoma (0/3).   C. LYMPH NODE, COMMON HEPATIC ARTERY, EXCISION:  - Three of three lymph nodes negative for carcinoma (0/3).   D. APPENDIX, APPENDECTOMY:  -  Benign appendix.   E. LYMPH NODE, AORTOCAVAL, EXCISION:  - One of  one lymph node negative for carcinoma (0/1).    03/24/2020 Cancer Staging   Staging form: Exocrine Pancreas, AJCC 8th Edition - Pathologic stage from 03/24/2020: Stage IIB (pT1c, pN1, cM0) - Signed by Truitt Merle, MD on 04/16/2020   04/14/2020 Imaging   CT AP  IMPRESSION: 1. Interval Whipple procedure. Pancreatic stent in place. 2. Ill-defined soft tissue in the right abdominal wall extending from the subcutaneous tissues to the anterior abdominal wall musculature, but no focal fluid collection. This may represent postsurgical change, sterility indeterminate by imaging. 3. Pelvic anatomy is poorly defined, including pelvic bowel loops, adnexa, and colon. There appears to be sigmoid colonic wall thickening as well as intraluminal fluid, with additional diffuse colonic wall thickening from the transverse colon distally. Findings are suggestive of generalized colitis. Attention to this area on follow-up is recommended. 4. Small amount of non organized free fluid in the right upper quadrant. No evidence of intra-abdominal abscess. 5. Periportal edema, nonspecific. Previous biliary stent has been removed. Common bile duct is not well-defined on the current exam. 6. Mild splenomegaly. 7. Trace right pleural effusion.   05/19/2020 -  Chemotherapy   Adjuvant Gemcitabine 2 weeks on 1 week off starting 05/19/20 and Xeloda for 2 months       CURRENT THERAPY:  Adjuvant Gemcitabine 2 weeks on 1 week off starting 05/19/20 and Xeloda started on 05/25/2020  INTERVAL HISTORY:  Breanna Noble is here for a follow up. She presents to the clinic with her husband.  She tolerated first cycle of gemcitabine well last week, no significant fatigue, nausea, or other noticeable side effects. Her appetite has improved some since last week, and she is eating better.  Her diarrhea also improved, overall energy level is better.  She has started exercise 10 minutes on stationary bike daily last week, and has been able  to do some light house work.   All other systems were reviewed with the patient and are negative.  MEDICAL HISTORY:  Past Medical History:  Diagnosis Date  . Cancer [facility])    pancreatic  . Cataracts, bilateral   . Diverticulosis 12/07/2001   found on colonoscopy  . Family history of colon cancer   . Family history of pancreatic cancer   . Hypothyroid 10/2007  . Macular hole   . Pancreatic mass   . PONV (postoperative nausea and vomiting)     SURGICAL HISTORY: Past Surgical History:  Procedure Laterality Date  . APPENDECTOMY N/A 03/24/2020   Procedure: APPENDECTOMY;  Surgeon: Stark Klein, MD;  Location: [place];  Service: General;  Laterality: N/A;  . BILIARY STENT PLACEMENT  10/10/2019   Procedure: BILIARY STENT PLACEMENT;  Surgeon: Arta Silence, MD;  Location: [place];  Service: Endoscopy;;  . CATARACT EXTRACTION, BILATERAL    . COLONOSCOPY    . ERCP N/A 10/10/2019   Procedure: ENDOSCOPIC RETROGRADE CHOLANGIOPANCREATOGRAPHY (ERCP);  Surgeon: Arta Silence, MD;  Location[facility] ENDOSCOPY;  Service: Endoscopy;  Laterality: N/A;  . ESOPHAGOGASTRODUODENOSCOPY N/A 10/10/2019   Procedure: ESOPHAGOGASTRODUODENOSCOPY (EGD);  Surgeon: Arta Silence, MD;  [facility]COPY;  Service: Endoscopy;  Laterality: N/A;  . EUS N/A 10/10/2019   Procedure: UPPER ENDOSCOPIC ULTRASOUND (EUS) LINEAR;  Surgeon: Arta Silence, MD;  Location: [place];  Service: Endoscopy;  Laterality: N/A;  . EYE SURGERY    . FINE NEEDLE ASPIRATION  10/10/2019   Procedure: FINE NEEDLE ASPIRATION (FNA) LINEAR;  Surgeon: Arta Silence, MD;  Location: [facility] ENDOSCOPY;  Service: Endoscopy;;  . IR IMAGING GUIDED PORT INSERTION  10/25/2019  . LAPAROSCOPY N/A 03/24/2020   Procedure: LAPAROSCOPY DIAGNOSTIC;  Surgeon: Stark Klein, MD;  Location: [place];  Service: General;  Laterality: N/A;  . Macular hole surg    . PANCREATIC STENT PLACEMENT  10/10/2019   Procedure: PANCREATIC STENT PLACEMENT;  Surgeon: Arta Silence, MD;  Location: [facility] ENDOSCOPY;  Service: Endoscopy;;  . SPHINCTEROTOMY  10/10/2019   Procedure: Joan Mayans;  Surgeon: Arta Silence, MD;  Location: [facility] ENDOSCOPY;  Service: Endoscopy;;  . TONSILLECTOMY    . TONSILLECTOMY    . WHIPPLE PROCEDURE N/A 03/24/2020   Procedure: WHIPPLE PROCEDURE;  Surgeon: Stark Klein, MD;  Location: [place];  Service: General;  Laterality: N/A;    I have reviewed the social history and family history with the patient and they are unchanged from previous note.  ALLERGIES:  has No Known Allergies.  MEDICATIONS:  Current Outpatient Medications  Medication Sig Dispense Refill  . Ascorbic Acid (VITAMIN C PO) Take 1,000 mg by mouth daily.     . B Complex Vitamins (B-COMPLEX/B-12 PO) Take by mouth. (Patient not taking: Reported on 03/17/2020)    . Calcium Carb-Cholecalciferol (CALCIUM 1000 + D PO) Take 1 tablet by mouth daily.     . capecitabine (XELODA) 500 MG tablet Take 3 tablets (1500 mg total) by mouth in morning and 2 tabs (1000 mg total) in evening. Take 14 days on, 7 days off, repeat every 21 days. Taken within 30 minutes of meals. 70 tablet 2  . diphenhydrAMINE (BENADRYL) 25 MG tablet Take 25 mg by mouth at bedtime.    . ferrous sulfate 325 (65 FE) MG tablet Take 325 mg by mouth daily with breakfast.    . ibuprofen (ADVIL) 200 MG tablet Take 400 mg by mouth every 6 (six) hours as needed for moderate pain.    Marland Kitchen levothyroxine (SYNTHROID, LEVOTHROID) 50 MCG tablet Take 1 tablet (50 mcg total) by mouth daily. 90 tablet 4  . lipase/protease/amylase (CREON) 36000 UNITS CPEP capsule Take 1 capsule (36,000 Units total) by mouth 3 (three) times daily with meals. May also take 1 capsule (36,000 Units total) as needed (with snacks). 50 capsule 0  . LORazepam (ATIVAN) 0.5 MG tablet Take 1 tablet (0.5 mg total) by mouth daily as needed for anxiety. For anticipatory n/v prior to chemo treatments (Patient not taking: Reported on 03/17/2020) 15 tablet 0  . Multiple  Vitamin (MULTIVITAMIN PO) Take 1 tablet by mouth daily.     . mupirocin ointment (BACTROBAN) 2 % Place 1 application into the nose daily as needed (sore in nose). (Patient not taking: Reported on 03/17/2020)    . ondansetron (ZOFRAN) 4 MG tablet Take 1 tablet (4 mg total) by mouth every 8 (eight) hours as needed. 30 tablet 2  . oxyCODONE (OXY IR/ROXICODONE) 5 MG immediate release tablet Take 1 tablet (5 mg total) by mouth every 6 (six) hours as needed for moderate pain. 30 tablet 0  . pantoprazole (PROTONIX) 40 MG tablet Take 1 tablet (40 mg total) by mouth daily with supper. 30 tablet 1  . prochlorperazine (COMPAZINE) 10 MG tablet TAKE 1 TABLET BY MOUTH EVERY 6 HOURS AS NEEDED FOR NAUSEA AND VOMITING 30 tablet 2  . vitamin B-12 (CYANOCOBALAMIN) 1000 MCG tablet Take 1,000 mcg by mouth daily.     No current facility-administered medications for this visit.    PHYSICAL EXAMINATION: ECOG PERFORMANCE STATUS: 2 - Symptomatic, <50% confined to bed  Vitals:   05/25/20 0814  BP: 127/67  Pulse: 85  Resp: 16  Temp: (!) 97 F (36.1 C)  SpO2: 100%   Filed Weights   05/25/20 0814  Weight: 109 lb 12.8 oz (49.8 kg)    GENERAL:alert, no distress and comfortable SKIN: skin color, texture, turgor are normal, no rashes or significant lesions EYES: normal, Conjunctiva are pink and non-injected, sclera clear Musculoskeletal:no cyanosis of digits and no clubbing  NEURO: alert & oriented x 3 with fluent speech, no focal motor/sensory deficits  LABORATORY DATA:  I have reviewed the data as listed CBC Latest Ref Rng & Units 05/25/2020 05/19/2020 05/07/2020  WBC 4.0 - 10.5 K/uL 2.7(L) 4.4 4.6  Hemoglobin 12.0 - 15.0 g/dL 10.0(L) 10.4(L) 10.3(L)  Hematocrit 36.0 - 46.0 % 32.6(L) 32.7(L) 33.5(L)  Platelets 150 - 400 K/uL 108(L) 115(L) 146(L)     CMP Latest Ref Rng & Units 05/19/2020 05/07/2020 04/16/2020  Glucose 70 - 99 mg/dL 138(H) 125(H) 125(H)  BUN 6 - 20 mg/dL 10 5(L) 6  Creatinine 0.44 - 1.00 mg/dL  0.80 0.57 0.52  Sodium 135 - 145 mmol/L 137 137 131(L)  Potassium 3.5 - 5.1 mmol/L 3.8 3.7 3.5  Chloride 98 - 111 mmol/L 101 103 92(L)  CO2 22 - 32 mmol/L $RemoveB'27 27 28  'aemfRNBG$ Calcium 8.9 - 10.3 mg/dL 8.6(L) 8.9 8.8(L)  Total Protein 6.5 - 8.1 g/dL 7.1 7.5 7.3  Total Bilirubin 0.3 - 1.2 mg/dL 0.3 0.4 0.4  Alkaline Phos 38 - 126 U/L 137(H) 124 160(H)  AST 15 - 41 U/L 36 38 23  ALT 0 - 44 U/L $Remo'24 21 15      'sxBMc$ RADIOGRAPHIC STUDIES: I have personally reviewed the radiological images as listed and agreed with the findings in the report. No results found.   ASSESSMENT & PLAN:  SHAVONNE AMBROISE is a 59 y.o. female with    1. Pancreatic Cancer, Stage IIB,cT2N1M0, ypT1N1 -She was diagnosed in 10/2019.Her initialUS showed bile duct and gallbladder dilation due to 2-3cm mass at head of pancreas/Uncinate. Her MRI showed her mass is laying close to SMV but not involving it. Her CT CAP from 10/23/19 showedborderline potential adenopathy in the periaortic region which will be followed on restaging scans. There is no definite vascular involvementor distant metastasis. -She underwent EUS and ERCP on 10/10/19 and had 2 stents placed by Dr Paulita Fujita. Herpancreatic mass biopsyshowed Adenocarcinoma of Pancreas.Endoscopic staging T2N1, noevidence of vascular involvement. -She completed 8 cycles of neoadjuvant FOLFIRINOX7/10/21-10/21/21,tolerated moderately well -She underwent Whipple surgery with Dr Barry Dienes on 03/24/20. Path showeda residual 1 cm moderately differentiated adenocarcinoma, and 1 out of 14 positive lymph nodes. Surgical margins were negative. Lymph nodes biopsy from portal, hepatic artery, and aortocaval were all negative. -We discussed her that she had good response to neoadjuvant chemotherapy, but still has significant residual disease, especially positive lymph nodes, which will predict high risk for recurrence after surgery. -I recommended 2 more months adjuvant chemotherapy with gemcitabine and  Xeloda. -she tolerated first dose gemcitabine well last week -she is overall doing better, lab reviewed, ANC 1.1, adequately for treatment, will proceed C1D8 gemcitaibne at same dose  -will start Xeloda $RemoveBefo'1500mg'[id number]$  bid for 7 days starting today  -f/u in 2 weeks   3. Upper Abdominal Pain,Weight loss, Nausea -Presented with initial jaundice but since stent placement her pain hasmostlyresolvedand only occasional. -Nauseafrom chemois controlled on Compazineand Zofran. Her weight is maintained onBoost and premier proteinand adequate eating.If she loses more weight I recommend she increase supplements. -  She will continue to f/u with dietician.Stable.  4. Genetic Testingin 10/2019 was negative for pathogenetic mutations.   5. pancytopenia, secondary to chemo  -will monitor closely    PLAN:  -CBC reviewed, ANC 1.1, OK to proceed C1D8 gemcitabine at same dose --will start Xeloda $RemoveBefo'1500mg'hjjfNbdZSOy$  bid for 7 days starting today  -f/u in 2 weeks before C2 -lab next week, with injection of filgramsti, will get insurance approval    No problem-specific Assessment & Plan notes found for this encounter.   No orders of the defined types were placed in this encounter.  All questions were answered. The patient knows to call the clinic with any problems, questions or concerns. No barriers to learning was detected. The total time spent in the appointment was 30 minutes.     Truitt Merle, MD 05/25/2020   I, Joslyn Devon, am acting as scribe for Truitt Merle, MD.   I have reviewed the above documentation for accuracy and completeness, and I agree with the above.

## 2020-05-25 ENCOUNTER — Other Ambulatory Visit: Payer: Self-pay

## 2020-05-25 ENCOUNTER — Inpatient Hospital Stay: Payer: BC Managed Care – PPO

## 2020-05-25 ENCOUNTER — Encounter: Payer: Self-pay | Admitting: Hematology

## 2020-05-25 ENCOUNTER — Inpatient Hospital Stay: Payer: BC Managed Care – PPO | Admitting: Hematology

## 2020-05-25 VITALS — BP 127/67 | HR 85 | Temp 97.0°F | Resp 16 | Ht 65.0 in | Wt 109.8 lb

## 2020-05-25 DIAGNOSIS — C250 Malignant neoplasm of head of pancreas: Secondary | ICD-10-CM | POA: Diagnosis not present

## 2020-05-25 DIAGNOSIS — Z452 Encounter for adjustment and management of vascular access device: Secondary | ICD-10-CM | POA: Diagnosis not present

## 2020-05-25 DIAGNOSIS — Z5111 Encounter for antineoplastic chemotherapy: Secondary | ICD-10-CM | POA: Diagnosis not present

## 2020-05-25 DIAGNOSIS — C257 Malignant neoplasm of other parts of pancreas: Secondary | ICD-10-CM | POA: Diagnosis not present

## 2020-05-25 DIAGNOSIS — D61810 Antineoplastic chemotherapy induced pancytopenia: Secondary | ICD-10-CM | POA: Diagnosis not present

## 2020-05-25 LAB — CBC WITH DIFFERENTIAL (CANCER CENTER ONLY)
Abs Immature Granulocytes: 0 10*3/uL (ref 0.00–0.07)
Basophils Absolute: 0 10*3/uL (ref 0.0–0.1)
Basophils Relative: 1 %
Eosinophils Absolute: 0.1 10*3/uL (ref 0.0–0.5)
Eosinophils Relative: 2 %
HCT: 32.6 % — ABNORMAL LOW (ref 36.0–46.0)
Hemoglobin: 10 g/dL — ABNORMAL LOW (ref 12.0–15.0)
Immature Granulocytes: 0 %
Lymphocytes Relative: 41 %
Lymphs Abs: 1.1 10*3/uL (ref 0.7–4.0)
MCH: 26.9 pg (ref 26.0–34.0)
MCHC: 30.7 g/dL (ref 30.0–36.0)
MCV: 87.6 fL (ref 80.0–100.0)
Monocytes Absolute: 0.4 10*3/uL (ref 0.1–1.0)
Monocytes Relative: 15 %
Neutro Abs: 1.1 10*3/uL — ABNORMAL LOW (ref 1.7–7.7)
Neutrophils Relative %: 41 %
Platelet Count: 108 10*3/uL — ABNORMAL LOW (ref 150–400)
RBC: 3.72 MIL/uL — ABNORMAL LOW (ref 3.87–5.11)
RDW: 16.5 % — ABNORMAL HIGH (ref 11.5–15.5)
WBC Count: 2.7 10*3/uL — ABNORMAL LOW (ref 4.0–10.5)
nRBC: 0 % (ref 0.0–0.2)

## 2020-05-25 LAB — CMP (CANCER CENTER ONLY)
ALT: 19 U/L (ref 0–44)
AST: 29 U/L (ref 15–41)
Albumin: 3.1 g/dL — ABNORMAL LOW (ref 3.5–5.0)
Alkaline Phosphatase: 147 U/L — ABNORMAL HIGH (ref 38–126)
Anion gap: 9 (ref 5–15)
BUN: 5 mg/dL — ABNORMAL LOW (ref 6–20)
CO2: 25 mmol/L (ref 22–32)
Calcium: 9.2 mg/dL (ref 8.9–10.3)
Chloride: 103 mmol/L (ref 98–111)
Creatinine: 0.57 mg/dL (ref 0.44–1.00)
GFR, Estimated: >60 mL/min (ref >60)
Glucose, Bld: 121 mg/dL — ABNORMAL HIGH (ref 70–99)
Potassium: 3.4 mmol/L — ABNORMAL LOW (ref 3.5–5.1)
Sodium: 137 mmol/L (ref 135–145)
Total Bilirubin: 0.3 mg/dL (ref 0.3–1.2)
Total Protein: 7.3 g/dL (ref 6.5–8.1)

## 2020-05-25 MED ORDER — PROCHLORPERAZINE MALEATE 10 MG PO TABS
ORAL_TABLET | ORAL | Status: AC
  Filled 2020-05-25: qty 1

## 2020-05-25 MED ORDER — PROCHLORPERAZINE MALEATE 10 MG PO TABS
10.0000 mg | ORAL_TABLET | Freq: Once | ORAL | Status: AC
  Administered 2020-05-25: 10 mg via ORAL

## 2020-05-25 MED ORDER — SODIUM CHLORIDE 0.9% FLUSH
10.0000 mL | INTRAVENOUS | Status: DC | PRN
  Administered 2020-05-25: 10 mL
  Filled 2020-05-25: qty 10

## 2020-05-25 MED ORDER — SODIUM CHLORIDE 0.9 % IV SOLN
Freq: Once | INTRAVENOUS | Status: AC
  Filled 2020-05-25: qty 250

## 2020-05-25 MED ORDER — SODIUM CHLORIDE 0.9 % IV SOLN
1000.0000 mg/m2 | Freq: Once | INTRAVENOUS | Status: AC
  Administered 2020-05-25: 1520 mg via INTRAVENOUS
  Filled 2020-05-25: qty 39.98

## 2020-05-25 MED ORDER — HEPARIN SOD (PORK) LOCK FLUSH 100 UNIT/ML IV SOLN
500.0000 [IU] | Freq: Once | INTRAVENOUS | Status: AC | PRN
Start: 2020-05-25 — End: 2020-05-25
  Administered 2020-05-25: 500 [IU]
  Filled 2020-05-25: qty 5

## 2020-05-25 NOTE — Patient Instructions (Signed)
Gemcitabine injection What is this medicine? GEMCITABINE (jem SYE ta been) is a chemotherapy drug. This medicine is used to treat many types of cancer like breast cancer, lung cancer, pancreatic cancer, and ovarian cancer. This medicine may be used for other purposes; ask your health care provider or pharmacist if you have questions. COMMON BRAND NAME(S): Gemzar, Infugem What should I tell my health care provider before I take this medicine? They need to know if you have any of these conditions:  blood disorders  infection  kidney disease  liver disease  lung or breathing disease, like asthma  recent or ongoing radiation therapy  an unusual or allergic reaction to gemcitabine, other chemotherapy, other medicines, foods, dyes, or preservatives  pregnant or trying to get pregnant  breast-feeding How should I use this medicine? This drug is given as an infusion into a vein. It is administered in a hospital or clinic by a specially trained health care professional. Talk to your pediatrician regarding the use of this medicine in children. Special care may be needed. Overdosage: If you think you have taken too much of this medicine contact a poison control center or emergency room at once. NOTE: This medicine is only for you. Do not share this medicine with others. What if I miss a dose? It is important not to miss your dose. Call your doctor or health care professional if you are unable to keep an appointment. What may interact with this medicine?  medicines to increase blood counts like filgrastim, pegfilgrastim, sargramostim  some other chemotherapy drugs like cisplatin  vaccines Talk to your doctor or health care professional before taking any of these medicines:  acetaminophen  aspirin  ibuprofen  ketoprofen  naproxen This list may not describe all possible interactions. Give your health care provider a list of all the medicines, herbs, non-prescription drugs, or  dietary supplements you use. Also tell them if you smoke, drink alcohol, or use illegal drugs. Some items may interact with your medicine. What should I watch for while using this medicine? Visit your doctor for checks on your progress. This drug may make you feel generally unwell. This is not uncommon, as chemotherapy can affect healthy cells as well as cancer cells. Report any side effects. Continue your course of treatment even though you feel ill unless your doctor tells you to stop. In some cases, you may be given additional medicines to help with side effects. Follow all directions for their use. Call your doctor or health care professional for advice if you get a fever, chills or sore throat, or other symptoms of a cold or flu. Do not treat yourself. This drug decreases your body's ability to fight infections. Try to avoid being around people who are sick. This medicine may increase your risk to bruise or bleed. Call your doctor or health care professional if you notice any unusual bleeding. Be careful brushing and flossing your teeth or using a toothpick because you may get an infection or bleed more easily. If you have any dental work done, tell your dentist you are receiving this medicine. Avoid taking products that contain aspirin, acetaminophen, ibuprofen, naproxen, or ketoprofen unless instructed by your doctor. These medicines may hide a fever. Do not become pregnant while taking this medicine or for 6 months after stopping it. Women should inform their doctor if they wish to become pregnant or think they might be pregnant. Men should not father a child while taking this medicine and for 3 months after stopping it.   There is a potential for serious side effects to an unborn child. Talk to your health care professional or pharmacist for more information. Do not breast-feed an infant while taking this medicine or for at least 1 week after stopping it. Men should inform their doctors if they wish  to father a child. This medicine may lower sperm counts. Talk with your doctor or health care professional if you are concerned about your fertility. What side effects may I notice from receiving this medicine? Side effects that you should report to your doctor or health care professional as soon as possible:  allergic reactions like skin rash, itching or hives, swelling of the face, lips, or tongue  breathing problems  pain, redness, or irritation at site where injected  signs and symptoms of a dangerous change in heartbeat or heart rhythm like chest pain; dizziness; fast or irregular heartbeat; palpitations; feeling faint or lightheaded, falls; breathing problems  signs of decreased platelets or bleeding - bruising, pinpoint red spots on the skin, black, tarry stools, blood in the urine  signs of decreased red blood cells - unusually weak or tired, feeling faint or lightheaded, falls  signs of infection - fever or chills, cough, sore throat, pain or difficulty passing urine  signs and symptoms of kidney injury like trouble passing urine or change in the amount of urine  signs and symptoms of liver injury like dark yellow or brown urine; general ill feeling or flu-like symptoms; light-colored stools; loss of appetite; nausea; right upper belly pain; unusually weak or tired; yellowing of the eyes or skin  swelling of ankles, feet, hands Side effects that usually do not require medical attention (report to your doctor or health care professional if they continue or are bothersome):  constipation  diarrhea  hair loss  loss of appetite  nausea  rash  vomiting This list may not describe all possible side effects. Call your doctor for medical advice about side effects. You may report side effects to FDA at 1-800-FDA-1088. Where should I keep my medicine? This drug is given in a hospital or clinic and will not be stored at home. NOTE: This sheet is a summary. It may not cover all  possible information. If you have questions about this medicine, talk to your doctor, pharmacist, or health care provider.  2021 [employer] (2017-07-12 18:06:11)  

## 2020-05-25 NOTE — Progress Notes (Signed)
Per Dr. Burr Medico, ok to proceed with ANC of 1.1 IV was pulling from normal saline bag, not Gemzar for infusion. Pt notified. Gemzar restarted, Regulatory affairs officer to verify.

## 2020-05-27 ENCOUNTER — Telehealth: Payer: Self-pay | Admitting: Hematology

## 2020-05-27 NOTE — Telephone Encounter (Signed)
No 1/24 los. No changes made to pt's schedule.  

## 2020-06-02 ENCOUNTER — Other Ambulatory Visit: Payer: Self-pay

## 2020-06-02 ENCOUNTER — Inpatient Hospital Stay: Payer: BC Managed Care – PPO

## 2020-06-02 ENCOUNTER — Inpatient Hospital Stay: Payer: BC Managed Care – PPO | Attending: Hematology

## 2020-06-02 VITALS — BP 112/62 | HR 88 | Temp 98.2°F | Resp 18

## 2020-06-02 DIAGNOSIS — C257 Malignant neoplasm of other parts of pancreas: Secondary | ICD-10-CM | POA: Insufficient documentation

## 2020-06-02 DIAGNOSIS — C250 Malignant neoplasm of head of pancreas: Secondary | ICD-10-CM

## 2020-06-02 DIAGNOSIS — Z5111 Encounter for antineoplastic chemotherapy: Secondary | ICD-10-CM | POA: Diagnosis not present

## 2020-06-02 DIAGNOSIS — Z95828 Presence of other vascular implants and grafts: Secondary | ICD-10-CM

## 2020-06-02 DIAGNOSIS — Z5189 Encounter for other specified aftercare: Secondary | ICD-10-CM | POA: Diagnosis not present

## 2020-06-02 LAB — CBC WITH DIFFERENTIAL (CANCER CENTER ONLY)
Abs Immature Granulocytes: 0.01 10*3/uL (ref 0.00–0.07)
Basophils Absolute: 0 10*3/uL (ref 0.0–0.1)
Basophils Relative: 1 %
Eosinophils Absolute: 0 10*3/uL (ref 0.0–0.5)
Eosinophils Relative: 1 %
HCT: 33.3 % — ABNORMAL LOW (ref 36.0–46.0)
Hemoglobin: 10.4 g/dL — ABNORMAL LOW (ref 12.0–15.0)
Immature Granulocytes: 0 %
Lymphocytes Relative: 41 %
Lymphs Abs: 1.5 10*3/uL (ref 0.7–4.0)
MCH: 27.4 pg (ref 26.0–34.0)
MCHC: 31.2 g/dL (ref 30.0–36.0)
MCV: 87.9 fL (ref 80.0–100.0)
Monocytes Absolute: 0.6 10*3/uL (ref 0.1–1.0)
Monocytes Relative: 18 %
Neutro Abs: 1.4 10*3/uL — ABNORMAL LOW (ref 1.7–7.7)
Neutrophils Relative %: 39 %
Platelet Count: 108 10*3/uL — ABNORMAL LOW (ref 150–400)
RBC: 3.79 MIL/uL — ABNORMAL LOW (ref 3.87–5.11)
RDW: 17.2 % — ABNORMAL HIGH (ref 11.5–15.5)
WBC Count: 3.6 10*3/uL — ABNORMAL LOW (ref 4.0–10.5)
nRBC: 0 % (ref 0.0–0.2)

## 2020-06-02 LAB — CMP (CANCER CENTER ONLY)
ALT: 17 U/L (ref 0–44)
AST: 24 U/L (ref 15–41)
Albumin: 3.4 g/dL — ABNORMAL LOW (ref 3.5–5.0)
Alkaline Phosphatase: 169 U/L — ABNORMAL HIGH (ref 38–126)
Anion gap: 6 (ref 5–15)
BUN: 5 mg/dL — ABNORMAL LOW (ref 6–20)
CO2: 29 mmol/L (ref 22–32)
Calcium: 9.1 mg/dL (ref 8.9–10.3)
Chloride: 101 mmol/L (ref 98–111)
Creatinine: 0.58 mg/dL (ref 0.44–1.00)
GFR, Estimated: >60 mL/min (ref >60)
Glucose, Bld: 91 mg/dL (ref 70–99)
Potassium: 3.3 mmol/L — ABNORMAL LOW (ref 3.5–5.1)
Sodium: 136 mmol/L (ref 135–145)
Total Bilirubin: 0.4 mg/dL (ref 0.3–1.2)
Total Protein: 7.4 g/dL (ref 6.5–8.1)

## 2020-06-02 MED ORDER — FILGRASTIM-AAFI 480 MCG/0.8ML IJ SOSY
480.0000 ug | PREFILLED_SYRINGE | Freq: Once | INTRAMUSCULAR | Status: AC
  Administered 2020-06-02: 480 ug via SUBCUTANEOUS
  Filled 2020-06-02: qty 0.8

## 2020-06-02 MED ORDER — FILGRASTIM-SNDZ 480 MCG/0.8ML IJ SOSY
PREFILLED_SYRINGE | INTRAMUSCULAR | Status: AC
  Filled 2020-06-02: qty 0.8

## 2020-06-03 ENCOUNTER — Other Ambulatory Visit: Payer: Self-pay | Admitting: Nurse Practitioner

## 2020-06-03 ENCOUNTER — Telehealth: Payer: Self-pay

## 2020-06-03 MED ORDER — POTASSIUM CHLORIDE CRYS ER 20 MEQ PO TBCR: 20.0000 meq | EXTENDED_RELEASE_TABLET | Freq: Every day | ORAL | 0 refills | Status: DC

## 2020-06-03 NOTE — Telephone Encounter (Signed)
-----   Message from Alla Feeling, NP sent at 06/03/2020  8:49 AM EST ----- Please let her know K has been mildly low recently 3.3 - 3.4, I recommend to increase potassium in her diet and start supplement 20 mEq po once daily. I sent in Rx to her pharmacy.   Thanks, Regan Rakers, NP

## 2020-06-03 NOTE — Telephone Encounter (Signed)
Pt called given most recent lab results and recommendations for increasing potassium in diet given some examples of potassium rich foods also made her aware of new script sent to pharmacy  Encouraged to call for any questions concerns and/or changes

## 2020-06-08 NOTE — Progress Notes (Signed)
Breanna Noble   Telephone:([phone]) [phone] Fax:([phone]) [phone]   Clinic Follow up Note   Patient Care Team: Vernie Shanks, MD as PCP - General (Family Medicine) Fontaine, Belinda Block, MD (Inactive) as Consulting Physician (Gynecology) Truitt Merle, MD as Consulting Physician (Oncology) Jonnie Finner, RN as Registered Nurse Arta Silence, MD as Consulting Physician (Gastroenterology) Stark Klein, MD as Consulting Physician (General Surgery) Karie Mainland, RD as Dietitian (Nutrition) 06/09/2020  CHIEF COMPLAINT: Follow up pancreas cancer   SUMMARY OF ONCOLOGIC HISTORY: Oncology History Overview Note  Cancer Staging Pancreatic cancer [facility]) Staging form: Exocrine Pancreas, AJCC 8th Edition - Clinical stage from 10/16/2019: Stage IIB (cT2, cN1, cM0) - Signed by Truitt Merle, MD on 10/16/2019    Pancreatic cancer ([place])  10/03/2019 Imaging   US Abdomen 10/03/19  IMPRESSION: Markedly dilated bile ducts. Dilated gallbladder with sludge but no gallstones.   2.9 cm mass in the uncinate process most likely pancreatic neoplasm causing biliary obstruction. Recommend MRI of the pancreas without with contrast for further evaluation. If the patient cannot have MRI, CT of the pancreas with contrast recommended.   10/07/2019 Imaging   MRI Abdomen 10/07/19  IMPRESSION: 1. There is a hypoenhancing mass of the pancreatic uncinate with abrupt obstruction of the central common bile duct, measuring approximately 2.9 cm, consistent with pancreatic adenocarcinoma. 2. Gross intra and extrahepatic biliary ductal dilatation, common bile duct measuring 1.4 cm. Gallbladder hydrops. 3. The pancreatic duct is nondilated. 4. Pancreatic mass lies closely adjacent to the most central portions of the superior mesenteric vein and central portal vein, due to motion artifact it is difficult to clearly discern whether there is a preserved fat plane. Multiphasic contrast enhanced pancreatic protocol CT may  be less motion sensitive and better delineate vascular structures for the purposes of surgical planning if necessary. 5. No evidence of lymphadenopathy or metastatic disease in the abdomen.   10/10/2019 Procedure   EUS by Dr Paulita Fujita 10/10/19  IMPRESSION - A mass was identified at junction of head/uncinate process of the pancreas. This was staged T2 N1 Mx by endosonographic criteria. Fine needle aspiration performed. - A few abnormal lymph nodes were visualized in the peripancreatic region. - There was dilation in the common bile duct which measured up to 14 mm. - Hyperechoic material consistent with sludge was visualized endosonographically in the gallbladder.   10/10/2019 Procedure   ERCP by Dr Paulita Fujita 10/10/19  IMPRESSION - The major papilla appeared normal. - One temporary stent was placed into the ventral pancreatic duct. - A biliary sphincterotomy was performed. - One covered metal stent was placed into the common bile duct.   10/10/2019 Initial Biopsy   FINAL MICROSCOPIC DIAGNOSIS: 10/10/19  A. PANCREAS, HEAD, FINE NEEDLE ASPIRATION:  - Malignant cells present, consistent with adenocarcinoma    10/16/2019 Initial Diagnosis   Pancreatic cancer ([place])   10/16/2019 Cancer Staging   Staging form: Exocrine Pancreas, AJCC 8th Edition - Clinical stage from 10/16/2019: Stage IIB (cT2, cN1, cM0) - Signed by Truitt Merle, MD on 10/16/2019   10/23/2019 Imaging   CT CAP W contrast  IMPRESSION: 1. Pancreatic mass with local adenopathy and potential retroperitoneal adenopathy. Given retroperitoneal lymph nodes and presence of venous collaterals PET scan may be useful for staging purposes. 2. No definite vascular involvement or signs of upper abdominal venous collaterals. Some stranding around the celiac may be due to recent biopsy. 3. Borderline enlarged RIGHT external iliac lymph node. Attention on follow-up. 4. No evidence of metastatic disease in  the chest. 5. Signs of recent ERCP and  stent placement.   10/25/2019 Procedure   PAC placed    11/09/2019 - 02/20/2020 Chemotherapy   Neoadjuvant FOLFIRINOX q2weeks starting 11/09/19-02/20/20 for 8 cycles   11/28/2019 Genetic Testing   Negative genetic testing on the common hereditary cancer panel, pancreatic cancer panel, preliminary evidence pancreatic cancer panel and chronic pancreatitis panel.  The Common Hereditary Gene Panel offered by [facility] includes sequencing and/or deletion duplication testing of the following 55 genes: APC*, ATM*, AXIN2, BARD1, BMPR1A, BRCA1, BRCA2, BRIP1, CASR, CDH1, CDK4, CDKN2A (p14ARF), CDKN2A (p16INK4a), CFTR*, CHEK2, CPA1, CTNNA1, CTRC, DICER1*, EPCAM*, FANCC, GREM1*, HOXB13, KIT, MEN1*, MLH1*, MSH2*, MSH3*, MSH6*, MUTYH, NBN, NF1*, NTHL1, PALB2, PALLD, PDGFRA, PMS2*, POLD1*, POLE, PRSS1*, PTEN*, RAD50, RAD51C, RAD51D, SDHA*, SDHB, SDHC*, SDHD, SMAD4, SMARCA4, SPINK1, STK11, TP53, TSC1*, TSC2, and VHL .  The report date is November 28, 2019.   01/28/2020 Imaging   CT AP w contrast  IMPRESSION: 1. No substantial interval change in exam. 2. Similar appearance of the pancreatic mass with associated main pancreatic ductal dilatation. 3. Soft tissue stranding again noted around the celiac axis, in the para-aortic retroperitoneal space and along the IMA (new in the interval). These findings are associated with similar appearance of upper normal to borderline hepatoduodenal ligament and retroperitoneal lymphadenopathy. Close continued attention on follow-up recommended. 4. Borderline enlarged right external iliac node identified on previous study has decreased in size in the interval.   02/14/2020 PET scan   IMPRESSION: 1. The pancreatic mass has a maximum SUV of 6.1. No findings of hypermetabolic local adenopathy or distant metastatic spread. 2. Splenomegaly. 3. Inspissated barium or appendicolith within the appendix. No signs of appendiceal inflammation. 4. Scattered sigmoid colon  diverticula. ADDENDUM: The original report was by Dr. Van Clines. The following addendum is by Dr. Van Clines:   On the CT from 01/28/2020 there were some upper normal sized/borderline retroperitoneal lymph nodes including a 0.8 cm aortocaval node on image 72 of series 9. This currently has a maximum SUV of 2.2, which is similar to blood pool activity. An indistinctly marginated lymph node just below the left renal vein has a maximum SUV of 2.4, just above blood pool levels., and measures about 1.0 cm in diameter. Lymph nodes anterior to the left psoas muscle have maximum SUV of 2.0, similar to blood pool. Overall these lymph nodes are borderline enlarged and with activity level at or just minimally above blood pool. While certainly not grossly positive, their proximity to the pancreatic head mass and borderline imaging characteristics make it difficult to exclude early nodal involvement by malignancy.   03/24/2020 Surgery   Whipple surgery, Laparoscopic Diagnostic, Appendectomy by Dr Barry Dienes    03/24/2020 Pathology Results   FINAL MICROSCOPIC DIAGNOSIS:   A. WHIPPLE PROCEDURE, WITH GALLBLADDER:  - Adenocarcinoma, moderately differentiated, spanning 1 cm.  - Tumor involves retroperitoneal peripancreatic soft tissue.  - Resection margins are negative.  - Treatment effect present.  - Rare tumor cells in one of fourteen lymph nodes (1/14).  - Benign gallbladder with chronic inflammation and benign lymph node.  - Bile duct with granulation tissue and reactive changes.  - Benign appendix.  - Mild reactive gastropathy.  - See oncology table.   B. LYMPH NODE, PORTAL, EXCISION:  - Three of three lymph nodes negative for carcinoma (0/3).   C. LYMPH NODE, COMMON HEPATIC ARTERY, EXCISION:  - Three of three lymph nodes negative for carcinoma (0/3).   D. APPENDIX, APPENDECTOMY:  - Benign appendix.  E. LYMPH NODE, AORTOCAVAL, EXCISION:  - One of one lymph node negative  for carcinoma (0/1).    03/24/2020 Cancer Staging   Staging form: Exocrine Pancreas, AJCC 8th Edition - Pathologic stage from 03/24/2020: Stage IIB (pT1c, pN1, cM0) - Signed by Truitt Merle, MD on 04/16/2020   04/14/2020 Imaging   CT AP  IMPRESSION: 1. Interval Whipple procedure. Pancreatic stent in place. 2. Ill-defined soft tissue in the right abdominal wall extending from the subcutaneous tissues to the anterior abdominal wall musculature, but no focal fluid collection. This may represent postsurgical change, sterility indeterminate by imaging. 3. Pelvic anatomy is poorly defined, including pelvic bowel loops, adnexa, and colon. There appears to be sigmoid colonic wall thickening as well as intraluminal fluid, with additional diffuse colonic wall thickening from the transverse colon distally. Findings are suggestive of generalized colitis. Attention to this area on follow-up is recommended. 4. Small amount of non organized free fluid in the right upper quadrant. No evidence of intra-abdominal abscess. 5. Periportal edema, nonspecific. Previous biliary stent has been removed. Common bile duct is not well-defined on the current exam. 6. Mild splenomegaly. 7. Trace right pleural effusion.   05/19/2020 -  Chemotherapy   Adjuvant Gemcitabine 2 weeks on 1 week off starting 05/19/20 and Xeloda for 2 months      CURRENT THERAPY:  Adjuvant Gemcitabine 2 weeks on 1 week off starting 05/19/20 and Xeloda started on 05/25/2020  INTERVAL HISTORY: Breanna Noble returns for follow up as scheduled. She completed cycle 1 adjuvant gemcitabine and Xeloda from days 8 - 14 as prescribed. She did well on treatment. Adequate po. Energy continues to improve. Denies nausea or diarrhea, or mucositis. Fingertips and toes are slightly sensitive but no redness, skin breakdown, or neuropathy. She has some pain to touch at the upper mildine incision, occasionally burns. Not major or requiring pain medication. Denies  fever, chills, cough, chest pain, dyspnea, or other concerns.    MEDICAL HISTORY:  Past Medical History:  Diagnosis Date  . Cancer [facility])    pancreatic  . Cataracts, bilateral   . Diverticulosis 12/07/2001   found on colonoscopy  . Family history of colon cancer   . Family history of pancreatic cancer   . Hypothyroid 10/2007  . Macular hole   . Pancreatic mass   . PONV (postoperative nausea and vomiting)     SURGICAL HISTORY: Past Surgical History:  Procedure Laterality Date  . APPENDECTOMY N/A 03/24/2020   Procedure: APPENDECTOMY;  Surgeon: Stark Klein, MD;  Location: [place];  Service: General;  Laterality: N/A;  . BILIARY STENT PLACEMENT  10/10/2019   Procedure: BILIARY STENT PLACEMENT;  Surgeon: Arta Silence, MD;  Location: [place];  Service: Endoscopy;;  . CATARACT EXTRACTION, BILATERAL    . COLONOSCOPY    . ERCP N/A 10/10/2019   Procedure: ENDOSCOPIC RETROGRADE CHOLANGIOPANCREATOGRAPHY (ERCP);  Surgeon: Arta Silence, MD;  Location[facility] ENDOSCOPY;  Service: Endoscopy;  Laterality: N/A;  . ESOPHAGOGASTRODUODENOSCOPY N/A 10/10/2019   Procedure: ESOPHAGOGASTRODUODENOSCOPY (EGD);  Surgeon: Arta Silence, MD;  Location: [facility] ENDOSCOPY;  Service: Endoscopy;  Laterality: N/A;  . EUS N/A 10/10/2019   Procedure: UPPER ENDOSCOPIC ULTRASOUND (EUS) LINEAR;  Surgeon: Arta Silence, MD;  Location: [place];  Service: Endoscopy;  Laterality: N/A;  . EYE SURGERY    . FINE NEEDLE ASPIRATION  10/10/2019   Procedure: FINE NEEDLE ASPIRATION (FNA) LINEAR;  Surgeon: Arta Silence, MD;  Location: [place];  Service: Endoscopy;;  . IR IMAGING GUIDED PORT INSERTION  10/25/2019  .  LAPAROSCOPY N/A 03/24/2020   Procedure: LAPAROSCOPY DIAGNOSTIC;  Surgeon: Stark Klein, MD;  Location: [place];  Service: General;  Laterality: N/A;  . Macular hole surg    . PANCREATIC STENT PLACEMENT  10/10/2019   Procedure: PANCREATIC STENT PLACEMENT;  Surgeon: Arta Silence, MD;  Location: [facility]  ENDOSCOPY;  Service: Endoscopy;;  . SPHINCTEROTOMY  10/10/2019   Procedure: Joan Mayans;  Surgeon: Arta Silence, MD;  Location[facility] ENDOSCOPY;  Service: Endoscopy;;  . TONSILLECTOMY    . TONSILLECTOMY    . WHIPPLE PROCEDURE N/A 03/24/2020   Procedure: WHIPPLE PROCEDURE;  Surgeon: Stark Klein, MD;  Location: [place];  Service: General;  Laterality: N/A;    I have reviewed the social history and family history with the patient and they are unchanged from previous note.  ALLERGIES:  has No Known Allergies.  MEDICATIONS:  Current Outpatient Medications  Medication Sig Dispense Refill  . Ascorbic Acid (VITAMIN C PO) Take 1,000 mg by mouth daily.     . Calcium Carb-Cholecalciferol (CALCIUM 1000 + D PO) Take 1 tablet by mouth daily.     . capecitabine (XELODA) 500 MG tablet Take 3 tablets (1500 mg total) by mouth in morning and 2 tabs (1000 mg total) in evening. Take 14 days on, 7 days off, repeat every 21 days. Taken within 30 minutes of meals. 70 tablet 2  . ferrous sulfate 325 (65 FE) MG tablet Take 325 mg by mouth daily with breakfast.    . ibuprofen (ADVIL) 200 MG tablet Take 400 mg by mouth every 6 (six) hours as needed for moderate pain.    Marland Kitchen levothyroxine (SYNTHROID, LEVOTHROID) 50 MCG tablet Take 1 tablet (50 mcg total) by mouth daily. 90 tablet 4  . Multiple Vitamin (MULTIVITAMIN PO) Take 1 tablet by mouth daily.    . ondansetron (ZOFRAN) 4 MG tablet Take 1 tablet (4 mg total) by mouth every 8 (eight) hours as needed. 30 tablet 2  . pantoprazole (PROTONIX) 40 MG tablet Take 1 tablet (40 mg total) by mouth daily with supper. 30 tablet 1  . potassium chloride SA (KLOR-CON) 20 MEQ tablet Take 1 tablet (20 mEq total) by mouth daily. 30 tablet 0  . prochlorperazine (COMPAZINE) 10 MG tablet TAKE 1 TABLET BY MOUTH EVERY 6 HOURS AS NEEDED FOR NAUSEA AND VOMITING 30 tablet 2  . vitamin B-12 (CYANOCOBALAMIN) 1000 MCG tablet Take 1,000 mcg by mouth daily.    . B Complex Vitamins  (B-COMPLEX/B-12 PO) Take by mouth. (Patient not taking: No sig reported)    . diphenhydrAMINE (BENADRYL) 25 MG tablet Take 25 mg by mouth at bedtime.    . lipase/protease/amylase (CREON) 36000 UNITS CPEP capsule Take 1 capsule (36,000 Units total) by mouth 3 (three) times daily with meals. May also take 1 capsule (36,000 Units total) as needed (with snacks). 50 capsule 0  . LORazepam (ATIVAN) 0.5 MG tablet Take 1 tablet (0.5 mg total) by mouth daily as needed for anxiety. For anticipatory n/v prior to chemo treatments (Patient not taking: No sig reported) 15 tablet 0  . mupirocin ointment (BACTROBAN) 2 % Place 1 application into the nose daily as needed (sore in nose). (Patient not taking: Reported on 03/17/2020)    . oxyCODONE (OXY IR/ROXICODONE) 5 MG immediate release tablet Take 1 tablet (5 mg total) by mouth every 6 (six) hours as needed for moderate pain. 30 tablet 0   No current facility-administered medications for this visit.   Facility-Administered Medications Ordered in Other Visits  Medication Dose Route  Frequency Provider Last Rate Last Admin  . sodium chloride flush (NS) 0.9 % injection 10 mL  10 mL Intracatheter PRN Truitt Merle, MD   10 mL at 06/09/20 1142    PHYSICAL EXAMINATION: ECOG PERFORMANCE STATUS: 1 - Symptomatic but completely ambulatory  Vitals:   06/09/20 0922  BP: 104/66  Pulse: 79  Resp: 15  Temp: 97.6 F (36.4 C)  SpO2: 100%   Filed Weights   06/09/20 0922  Weight: 110 lb 6.4 oz (50.1 kg)    GENERAL:alert, no distress and comfortable SKIN: no rash. Palms without erythema  EYES: sclera clear LUNGS:  normal breathing effort HEART:  no lower extremity edema ABDOMEN:abdomen soft, non-tender and normal bowel sounds. mildine incision is closed, healed. Some scar tissue at the top and a small nodule above the umbilicus which is likely scar tissue as well. No palpable mass  NEURO: alert & oriented x 3 with fluent speech, no focal motor/sensory deficits PAC  without erythema   LABORATORY DATA:  I have reviewed the data as listed CBC Latest Ref Rng & Units 06/09/2020 06/02/2020 05/25/2020  WBC 4.0 - 10.5 K/uL 6.3 3.6(L) 2.7(L)  Hemoglobin 12.0 - 15.0 g/dL 10.2(L) 10.4(L) 10.0(L)  Hematocrit 36.0 - 46.0 % 32.7(L) 33.3(L) 32.6(L)  Platelets 150 - 400 K/uL 333 108(L) 108(L)     CMP Latest Ref Rng & Units 06/09/2020 06/02/2020 05/25/2020  Glucose 70 - 99 mg/dL 102(H) 91 121(H)  BUN 6 - 20 mg/dL 6 5(L) 5(L)  Creatinine 0.44 - 1.00 mg/dL 0.58 0.58 0.57  Sodium 135 - 145 mmol/L 136 136 137  Potassium 3.5 - 5.1 mmol/L 4.1 3.3(L) 3.4(L)  Chloride 98 - 111 mmol/L 102 101 103  CO2 22 - 32 mmol/L 27 29 25   Calcium 8.9 - 10.3 mg/dL 9.0 9.1 9.2  Total Protein 6.5 - 8.1 g/dL 7.2 7.4 7.3  Total Bilirubin 0.3 - 1.2 mg/dL 0.3 0.4 0.3  Alkaline Phos 38 - 126 U/L 137(H) 169(H) 147(H)  AST 15 - 41 U/L 22 24 29   ALT 0 - 44 U/L 14 17 19       RADIOGRAPHIC STUDIES: I have personally reviewed the radiological images as listed and agreed with the findings in the report. No results found.   ASSESSMENT & PLAN: Breanna Noble a 60 y.o.caucasianfemalewith a history of Cataracts, Diverticulosis, Hypothyroidism  1. Pancreatic Cancer, Stage IIB, T2N1M0 -Diagnosed 10/2019.  Her US showed bile duct and gallbladder dilation due to 2-3cm mass at head of pancreas/Uncinate. Her MRI showed her mass is laying close to SMV but not involving it. There is no evidence of lymphadenopathy or metastatic disease in the abdomenon MRI. -She underwent EUS and ERCP on 10/10/19 and had 2 stents placed by Dr Paulita Fujita. Herpancreatic mass biopsyshowed Adenocarcinoma of Pancreas.Endoscopic staging T2N1, noevidence of vascular involvement. -Given her young age and good performance status she completed 8 cycles of neoadjuvant FOLFIRINOX from 11/09/2019 - 02/20/2020, tolerated moderately well  -S/p Whipple surgery with Dr. Barry Dienes on 03/24/2020, path showed a residual 1 cm moderately differentiated  adenocarcinoma in 1/14 positive lymph nodes, margins negative -Dr. Burr Medico recommended 2 months adjuvant chemo with gemcitabine and Xeloda to reduce her high risk of recurrence -began adjuvant chemo with gemcitabine alone on 1/18, then added xeloda on days 8-14 of cycle 1.   2. Upper Abdominal Pain, Mild weight loss  -Presented with initial jaundice but since stent placement her pain has much improved. -Pain resolved on chemo -post-op pain is more cramping, especially if she  overeats. Worsened by creon, stopped after 2 doses  -f/up dietician. Encouraged to start supplements.   3. Genetic Testing -negative result 11/28/19  Disposition:  Breanna Noble appears stable.  She completed 1 cycle of adjuvant gemcitabine and Xeloda.  She tolerated treatment well with no significant toxicities.  She is able to recover and function well.    She has mild pain at her midline surgical incision, likely scar tissue.  We discussed the possibility of a neuropathic component.  She wants to monitor for now, may consider adding gabapentin if pain persists.    Labs reviewed, proceed with cycle 2 gemcitabine and xeloda today.  Return next week for cycle 2 day 8 gemcitabine, and follow-up with cycle 3 in 3 weeks.  All questions were answered. The patient knows to call the clinic with any problems, questions or concerns. No barriers to learning were detected.     Alla Feeling, NP 06/09/20

## 2020-06-09 ENCOUNTER — Inpatient Hospital Stay: Payer: BC Managed Care – PPO

## 2020-06-09 ENCOUNTER — Inpatient Hospital Stay: Payer: BC Managed Care – PPO | Admitting: Nurse Practitioner

## 2020-06-09 ENCOUNTER — Other Ambulatory Visit: Payer: Self-pay

## 2020-06-09 ENCOUNTER — Encounter: Payer: Self-pay | Admitting: Nurse Practitioner

## 2020-06-09 VITALS — BP 104/66 | HR 79 | Temp 97.6°F | Resp 15 | Ht 65.0 in | Wt 110.4 lb

## 2020-06-09 DIAGNOSIS — Z95828 Presence of other vascular implants and grafts: Secondary | ICD-10-CM

## 2020-06-09 DIAGNOSIS — C250 Malignant neoplasm of head of pancreas: Secondary | ICD-10-CM

## 2020-06-09 DIAGNOSIS — Z5189 Encounter for other specified aftercare: Secondary | ICD-10-CM | POA: Diagnosis not present

## 2020-06-09 DIAGNOSIS — Z5111 Encounter for antineoplastic chemotherapy: Secondary | ICD-10-CM | POA: Diagnosis not present

## 2020-06-09 DIAGNOSIS — C257 Malignant neoplasm of other parts of pancreas: Secondary | ICD-10-CM | POA: Diagnosis not present

## 2020-06-09 LAB — CMP (CANCER CENTER ONLY)
ALT: 14 U/L (ref 0–44)
AST: 22 U/L (ref 15–41)
Albumin: 3.4 g/dL — ABNORMAL LOW (ref 3.5–5.0)
Alkaline Phosphatase: 137 U/L — ABNORMAL HIGH (ref 38–126)
Anion gap: 7 (ref 5–15)
BUN: 6 mg/dL (ref 6–20)
CO2: 27 mmol/L (ref 22–32)
Calcium: 9 mg/dL (ref 8.9–10.3)
Chloride: 102 mmol/L (ref 98–111)
Creatinine: 0.58 mg/dL (ref 0.44–1.00)
GFR, Estimated: >60 mL/min (ref >60)
Glucose, Bld: 102 mg/dL — ABNORMAL HIGH (ref 70–99)
Potassium: 4.1 mmol/L (ref 3.5–5.1)
Sodium: 136 mmol/L (ref 135–145)
Total Bilirubin: 0.3 mg/dL (ref 0.3–1.2)
Total Protein: 7.2 g/dL (ref 6.5–8.1)

## 2020-06-09 LAB — CBC WITH DIFFERENTIAL (CANCER CENTER ONLY)
Abs Immature Granulocytes: 0.05 10*3/uL (ref 0.00–0.07)
Basophils Absolute: 0 10*3/uL (ref 0.0–0.1)
Basophils Relative: 0 %
Eosinophils Absolute: 0.1 10*3/uL (ref 0.0–0.5)
Eosinophils Relative: 1 %
HCT: 32.7 % — ABNORMAL LOW (ref 36.0–46.0)
Hemoglobin: 10.2 g/dL — ABNORMAL LOW (ref 12.0–15.0)
Immature Granulocytes: 1 %
Lymphocytes Relative: 30 %
Lymphs Abs: 1.9 10*3/uL (ref 0.7–4.0)
MCH: 27.6 pg (ref 26.0–34.0)
MCHC: 31.2 g/dL (ref 30.0–36.0)
MCV: 88.6 fL (ref 80.0–100.0)
Monocytes Absolute: 1.5 10*3/uL — ABNORMAL HIGH (ref 0.1–1.0)
Monocytes Relative: 24 %
Neutro Abs: 2.8 10*3/uL (ref 1.7–7.7)
Neutrophils Relative %: 44 %
Platelet Count: 333 10*3/uL (ref 150–400)
RBC: 3.69 MIL/uL — ABNORMAL LOW (ref 3.87–5.11)
RDW: 18.4 % — ABNORMAL HIGH (ref 11.5–15.5)
WBC Count: 6.3 10*3/uL (ref 4.0–10.5)
nRBC: 0 % (ref 0.0–0.2)

## 2020-06-09 MED ORDER — PROCHLORPERAZINE MALEATE 10 MG PO TABS
ORAL_TABLET | ORAL | Status: AC
  Filled 2020-06-09: qty 1

## 2020-06-09 MED ORDER — SODIUM CHLORIDE 0.9 % IV SOLN
Freq: Once | INTRAVENOUS | Status: AC
  Filled 2020-06-09: qty 250

## 2020-06-09 MED ORDER — HEPARIN SOD (PORK) LOCK FLUSH 100 UNIT/ML IV SOLN
500.0000 [IU] | Freq: Once | INTRAVENOUS | Status: AC | PRN
  Administered 2020-06-09: 500 [IU]
  Filled 2020-06-09: qty 5

## 2020-06-09 MED ORDER — SODIUM CHLORIDE 0.9% FLUSH
10.0000 mL | Freq: Once | INTRAVENOUS | Status: AC
  Administered 2020-06-09: 10 mL
  Filled 2020-06-09: qty 10

## 2020-06-09 MED ORDER — SODIUM CHLORIDE 0.9% FLUSH
10.0000 mL | INTRAVENOUS | Status: DC | PRN
  Administered 2020-06-09: 10 mL
  Filled 2020-06-09: qty 10

## 2020-06-09 MED ORDER — PROCHLORPERAZINE MALEATE 10 MG PO TABS
10.0000 mg | ORAL_TABLET | Freq: Once | ORAL | Status: AC
  Administered 2020-06-09: 10 mg via ORAL

## 2020-06-09 MED ORDER — SODIUM CHLORIDE 0.9 % IV SOLN
1000.0000 mg/m2 | Freq: Once | INTRAVENOUS | Status: AC
  Administered 2020-06-09: 1520 mg via INTRAVENOUS
  Filled 2020-06-09: qty 39.98

## 2020-06-09 NOTE — Patient Instructions (Signed)
Gemcitabine injection What is this medicine? GEMCITABINE (jem SYE ta been) is a chemotherapy drug. This medicine is used to treat many types of cancer like breast cancer, lung cancer, pancreatic cancer, and ovarian cancer. This medicine may be used for other purposes; ask your health care provider or pharmacist if you have questions. COMMON BRAND NAME(S): Gemzar, Infugem What should I tell my health care provider before I take this medicine? They need to know if you have any of these conditions:  blood disorders  infection  kidney disease  liver disease  lung or breathing disease, like asthma  recent or ongoing radiation therapy  an unusual or allergic reaction to gemcitabine, other chemotherapy, other medicines, foods, dyes, or preservatives  pregnant or trying to get pregnant  breast-feeding How should I use this medicine? This drug is given as an infusion into a vein. It is administered in a hospital or clinic by a specially trained health care professional. Talk to your pediatrician regarding the use of this medicine in children. Special care may be needed. Overdosage: If you think you have taken too much of this medicine contact a poison control center or emergency room at once. NOTE: This medicine is only for you. Do not share this medicine with others. What if I miss a dose? It is important not to miss your dose. Call your doctor or health care professional if you are unable to keep an appointment. What may interact with this medicine?  medicines to increase blood counts like filgrastim, pegfilgrastim, sargramostim  some other chemotherapy drugs like cisplatin  vaccines Talk to your doctor or health care professional before taking any of these medicines:  acetaminophen  aspirin  ibuprofen  ketoprofen  naproxen This list may not describe all possible interactions. Give your health care provider a list of all the medicines, herbs, non-prescription drugs, or  dietary supplements you use. Also tell them if you smoke, drink alcohol, or use illegal drugs. Some items may interact with your medicine. What should I watch for while using this medicine? Visit your doctor for checks on your progress. This drug may make you feel generally unwell. This is not uncommon, as chemotherapy can affect healthy cells as well as cancer cells. Report any side effects. Continue your course of treatment even though you feel ill unless your doctor tells you to stop. In some cases, you may be given additional medicines to help with side effects. Follow all directions for their use. Call your doctor or health care professional for advice if you get a fever, chills or sore throat, or other symptoms of a cold or flu. Do not treat yourself. This drug decreases your body's ability to fight infections. Try to avoid being around people who are sick. This medicine may increase your risk to bruise or bleed. Call your doctor or health care professional if you notice any unusual bleeding. Be careful brushing and flossing your teeth or using a toothpick because you may get an infection or bleed more easily. If you have any dental work done, tell your dentist you are receiving this medicine. Avoid taking products that contain aspirin, acetaminophen, ibuprofen, naproxen, or ketoprofen unless instructed by your doctor. These medicines may hide a fever. Do not become pregnant while taking this medicine or for 6 months after stopping it. Women should inform their doctor if they wish to become pregnant or think they might be pregnant. Men should not father a child while taking this medicine and for 3 months after stopping it.   There is a potential for serious side effects to an unborn child. Talk to your health care professional or pharmacist for more information. Do not breast-feed an infant while taking this medicine or for at least 1 week after stopping it. Men should inform their doctors if they wish  to father a child. This medicine may lower sperm counts. Talk with your doctor or health care professional if you are concerned about your fertility. What side effects may I notice from receiving this medicine? Side effects that you should report to your doctor or health care professional as soon as possible:  allergic reactions like skin rash, itching or hives, swelling of the face, lips, or tongue  breathing problems  pain, redness, or irritation at site where injected  signs and symptoms of a dangerous change in heartbeat or heart rhythm like chest pain; dizziness; fast or irregular heartbeat; palpitations; feeling faint or lightheaded, falls; breathing problems  signs of decreased platelets or bleeding - bruising, pinpoint red spots on the skin, black, tarry stools, blood in the urine  signs of decreased red blood cells - unusually weak or tired, feeling faint or lightheaded, falls  signs of infection - fever or chills, cough, sore throat, pain or difficulty passing urine  signs and symptoms of kidney injury like trouble passing urine or change in the amount of urine  signs and symptoms of liver injury like dark yellow or brown urine; general ill feeling or flu-like symptoms; light-colored stools; loss of appetite; nausea; right upper belly pain; unusually weak or tired; yellowing of the eyes or skin  swelling of ankles, feet, hands Side effects that usually do not require medical attention (report to your doctor or health care professional if they continue or are bothersome):  constipation  diarrhea  hair loss  loss of appetite  nausea  rash  vomiting This list may not describe all possible side effects. Call your doctor for medical advice about side effects. You may report side effects to FDA at 1-800-FDA-1088. Where should I keep my medicine? This drug is given in a hospital or clinic and will not be stored at home. NOTE: This sheet is a summary. It may not cover all  possible information. If you have questions about this medicine, talk to your doctor, pharmacist, or health care provider.  2021 [employer] (2017-07-12 18:06:11)  

## 2020-06-10 ENCOUNTER — Telehealth: Payer: Self-pay | Admitting: Hematology

## 2020-06-10 LAB — CANCER ANTIGEN 19-9: CA 19-9: 27 U/mL (ref 0–35)

## 2020-06-10 NOTE — Telephone Encounter (Signed)
Scheduled follow-up appointments per 2/8 los. Patient is aware. 

## 2020-06-16 ENCOUNTER — Inpatient Hospital Stay: Payer: BC Managed Care – PPO

## 2020-06-16 ENCOUNTER — Other Ambulatory Visit: Payer: Self-pay

## 2020-06-16 VITALS — BP 121/64 | HR 66 | Temp 98.4°F | Resp 16 | Wt 111.2 lb

## 2020-06-16 DIAGNOSIS — C250 Malignant neoplasm of head of pancreas: Secondary | ICD-10-CM

## 2020-06-16 DIAGNOSIS — Z5111 Encounter for antineoplastic chemotherapy: Secondary | ICD-10-CM | POA: Diagnosis not present

## 2020-06-16 DIAGNOSIS — Z5189 Encounter for other specified aftercare: Secondary | ICD-10-CM | POA: Diagnosis not present

## 2020-06-16 DIAGNOSIS — Z95828 Presence of other vascular implants and grafts: Secondary | ICD-10-CM

## 2020-06-16 DIAGNOSIS — C257 Malignant neoplasm of other parts of pancreas: Secondary | ICD-10-CM | POA: Diagnosis not present

## 2020-06-16 LAB — CMP (CANCER CENTER ONLY)
ALT: 17 U/L (ref 0–44)
AST: 25 U/L (ref 15–41)
Albumin: 3.4 g/dL — ABNORMAL LOW (ref 3.5–5.0)
Alkaline Phosphatase: 137 U/L — ABNORMAL HIGH (ref 38–126)
Anion gap: 8 (ref 5–15)
BUN: 6 mg/dL (ref 6–20)
CO2: 27 mmol/L (ref 22–32)
Calcium: 9 mg/dL (ref 8.9–10.3)
Chloride: 103 mmol/L (ref 98–111)
Creatinine: 0.6 mg/dL (ref 0.44–1.00)
GFR, Estimated: >60 mL/min (ref >60)
Glucose, Bld: 121 mg/dL — ABNORMAL HIGH (ref 70–99)
Potassium: 3.8 mmol/L (ref 3.5–5.1)
Sodium: 138 mmol/L (ref 135–145)
Total Bilirubin: 0.3 mg/dL (ref 0.3–1.2)
Total Protein: 6.9 g/dL (ref 6.5–8.1)

## 2020-06-16 LAB — CBC WITH DIFFERENTIAL (CANCER CENTER ONLY)
Abs Immature Granulocytes: 0.01 10*3/uL (ref 0.00–0.07)
Basophils Absolute: 0 10*3/uL (ref 0.0–0.1)
Basophils Relative: 1 %
Eosinophils Absolute: 0 10*3/uL (ref 0.0–0.5)
Eosinophils Relative: 0 %
HCT: 31.2 % — ABNORMAL LOW (ref 36.0–46.0)
Hemoglobin: 9.7 g/dL — ABNORMAL LOW (ref 12.0–15.0)
Immature Granulocytes: 0 %
Lymphocytes Relative: 36 %
Lymphs Abs: 1.2 10*3/uL (ref 0.7–4.0)
MCH: 27.4 pg (ref 26.0–34.0)
MCHC: 31.1 g/dL (ref 30.0–36.0)
MCV: 88.1 fL (ref 80.0–100.0)
Monocytes Absolute: 0.6 10*3/uL (ref 0.1–1.0)
Monocytes Relative: 18 %
Neutro Abs: 1.5 10*3/uL — ABNORMAL LOW (ref 1.7–7.7)
Neutrophils Relative %: 45 %
Platelet Count: 166 10*3/uL (ref 150–400)
RBC: 3.54 MIL/uL — ABNORMAL LOW (ref 3.87–5.11)
RDW: 17.9 % — ABNORMAL HIGH (ref 11.5–15.5)
WBC Count: 3.4 10*3/uL — ABNORMAL LOW (ref 4.0–10.5)
nRBC: 0 % (ref 0.0–0.2)

## 2020-06-16 MED ORDER — PROCHLORPERAZINE MALEATE 10 MG PO TABS
10.0000 mg | ORAL_TABLET | Freq: Once | ORAL | Status: AC
  Administered 2020-06-16: 10 mg via ORAL

## 2020-06-16 MED ORDER — PROCHLORPERAZINE MALEATE 10 MG PO TABS
ORAL_TABLET | ORAL | Status: AC
  Filled 2020-06-16: qty 1

## 2020-06-16 MED ORDER — SODIUM CHLORIDE 0.9% FLUSH
10.0000 mL | Freq: Once | INTRAVENOUS | Status: AC
  Administered 2020-06-16: 10 mL
  Filled 2020-06-16: qty 10

## 2020-06-16 MED ORDER — SODIUM CHLORIDE 0.9 % IV SOLN
1000.0000 mg/m2 | Freq: Once | INTRAVENOUS | Status: AC
  Administered 2020-06-16: 1520 mg via INTRAVENOUS
  Filled 2020-06-16: qty 39.98

## 2020-06-16 MED ORDER — SODIUM CHLORIDE 0.9 % IV SOLN
Freq: Once | INTRAVENOUS | Status: AC
  Filled 2020-06-16: qty 250

## 2020-06-16 MED ORDER — SODIUM CHLORIDE 0.9% FLUSH
10.0000 mL | INTRAVENOUS | Status: DC | PRN
  Administered 2020-06-16: 10 mL
  Filled 2020-06-16: qty 10

## 2020-06-16 MED ORDER — HEPARIN SOD (PORK) LOCK FLUSH 100 UNIT/ML IV SOLN
500.0000 [IU] | Freq: Once | INTRAVENOUS | Status: AC | PRN
  Administered 2020-06-16: 500 [IU]
  Filled 2020-06-16: qty 5

## 2020-06-16 NOTE — Patient Instructions (Signed)
Gemcitabine injection What is this medicine? GEMCITABINE (jem SYE ta been) is a chemotherapy drug. This medicine is used to treat many types of cancer like breast cancer, lung cancer, pancreatic cancer, and ovarian cancer. This medicine may be used for other purposes; ask your health care provider or pharmacist if you have questions. COMMON BRAND NAME(S): Gemzar, Infugem What should I tell my health care provider before I take this medicine? They need to know if you have any of these conditions:  blood disorders  infection  kidney disease  liver disease  lung or breathing disease, like asthma  recent or ongoing radiation therapy  an unusual or allergic reaction to gemcitabine, other chemotherapy, other medicines, foods, dyes, or preservatives  pregnant or trying to get pregnant  breast-feeding How should I use this medicine? This drug is given as an infusion into a vein. It is administered in a hospital or clinic by a specially trained health care professional. Talk to your pediatrician regarding the use of this medicine in children. Special care may be needed. Overdosage: If you think you have taken too much of this medicine contact a poison control center or emergency room at once. NOTE: This medicine is only for you. Do not share this medicine with others. What if I miss a dose? It is important not to miss your dose. Call your doctor or health care professional if you are unable to keep an appointment. What may interact with this medicine?  medicines to increase blood counts like filgrastim, pegfilgrastim, sargramostim  some other chemotherapy drugs like cisplatin  vaccines Talk to your doctor or health care professional before taking any of these medicines:  acetaminophen  aspirin  ibuprofen  ketoprofen  naproxen This list may not describe all possible interactions. Give your health care provider a list of all the medicines, herbs, non-prescription drugs, or  dietary supplements you use. Also tell them if you smoke, drink alcohol, or use illegal drugs. Some items may interact with your medicine. What should I watch for while using this medicine? Visit your doctor for checks on your progress. This drug may make you feel generally unwell. This is not uncommon, as chemotherapy can affect healthy cells as well as cancer cells. Report any side effects. Continue your course of treatment even though you feel ill unless your doctor tells you to stop. In some cases, you may be given additional medicines to help with side effects. Follow all directions for their use. Call your doctor or health care professional for advice if you get a fever, chills or sore throat, or other symptoms of a cold or flu. Do not treat yourself. This drug decreases your body's ability to fight infections. Try to avoid being around people who are sick. This medicine may increase your risk to bruise or bleed. Call your doctor or health care professional if you notice any unusual bleeding. Be careful brushing and flossing your teeth or using a toothpick because you may get an infection or bleed more easily. If you have any dental work done, tell your dentist you are receiving this medicine. Avoid taking products that contain aspirin, acetaminophen, ibuprofen, naproxen, or ketoprofen unless instructed by your doctor. These medicines may hide a fever. Do not become pregnant while taking this medicine or for 6 months after stopping it. Women should inform their doctor if they wish to become pregnant or think they might be pregnant. Men should not father a child while taking this medicine and for 3 months after stopping it.   There is a potential for serious side effects to an unborn child. Talk to your health care professional or pharmacist for more information. Do not breast-feed an infant while taking this medicine or for at least 1 week after stopping it. Men should inform their doctors if they wish  to father a child. This medicine may lower sperm counts. Talk with your doctor or health care professional if you are concerned about your fertility. What side effects may I notice from receiving this medicine? Side effects that you should report to your doctor or health care professional as soon as possible:  allergic reactions like skin rash, itching or hives, swelling of the face, lips, or tongue  breathing problems  pain, redness, or irritation at site where injected  signs and symptoms of a dangerous change in heartbeat or heart rhythm like chest pain; dizziness; fast or irregular heartbeat; palpitations; feeling faint or lightheaded, falls; breathing problems  signs of decreased platelets or bleeding - bruising, pinpoint red spots on the skin, black, tarry stools, blood in the urine  signs of decreased red blood cells - unusually weak or tired, feeling faint or lightheaded, falls  signs of infection - fever or chills, cough, sore throat, pain or difficulty passing urine  signs and symptoms of kidney injury like trouble passing urine or change in the amount of urine  signs and symptoms of liver injury like dark yellow or brown urine; general ill feeling or flu-like symptoms; light-colored stools; loss of appetite; nausea; right upper belly pain; unusually weak or tired; yellowing of the eyes or skin  swelling of ankles, feet, hands Side effects that usually do not require medical attention (report to your doctor or health care professional if they continue or are bothersome):  constipation  diarrhea  hair loss  loss of appetite  nausea  rash  vomiting This list may not describe all possible side effects. Call your doctor for medical advice about side effects. You may report side effects to FDA at 1-800-FDA-1088. Where should I keep my medicine? This drug is given in a hospital or clinic and will not be stored at home. NOTE: This sheet is a summary. It may not cover all  possible information. If you have questions about this medicine, talk to your doctor, pharmacist, or health care provider.  2021 [employer] (2017-07-12 18:06:11)  

## 2020-06-16 NOTE — Patient Instructions (Signed)
Implanted Port Insertion, Care After This sheet gives you information about how to care for yourself after your procedure. Your health care provider may also give you more specific instructions. If you have problems or questions, contact your health care provider. What can I expect after the procedure? After the procedure, it is common to have:  Discomfort at the port insertion site.  Bruising on the skin over the port. This should improve over 3-4 days. Follow these instructions at home: Port care  After your port is placed, you will get a manufacturer's information card. The card has information about your port. Keep this card with you at all times.  Take care of the port as told by your health care provider. Ask your health care provider if you or a family member can get training for taking care of the port at home. A home health care nurse may also take care of the port.  Make sure to remember what type of port you have. Incision care  Follow instructions from your health care provider about how to take care of your port insertion site. Make sure you: ? Wash your hands with soap and water before and after you change your bandage (dressing). If soap and water are not available, use hand sanitizer. ? Change your dressing as told by your health care provider. ? Leave stitches (sutures), skin glue, or adhesive strips in place. These skin closures may need to stay in place for 2 weeks or longer. If adhesive strip edges start to loosen and curl up, you may trim the loose edges. Do not remove adhesive strips completely unless your health care provider tells you to do that.  Check your port insertion site every day for signs of infection. Check for: ? Redness, swelling, or pain. ? Fluid or blood. ? Warmth. ? Pus or a bad smell.      Activity  Return to your normal activities as told by your health care provider. Ask your health care provider what activities are safe for you.  Do not  lift anything that is heavier than 10 lb (4.5 kg), or the limit that you are told, until your health care provider says that it is safe. General instructions  Take over-the-counter and prescription medicines only as told by your health care provider.  Do not take baths, swim, or use a hot tub until your health care provider approves. Ask your health care provider if you may take showers. You may only be allowed to take sponge baths.  Do not drive for 24 hours if you were given a sedative during your procedure.  Wear a medical alert bracelet in case of an emergency. This will tell any health care providers that you have a port.  Keep all follow-up visits as told by your health care provider. This is important. Contact a health care provider if:  You cannot flush your port with saline as directed, or you cannot draw blood from the port.  You have a fever or chills.  You have redness, swelling, or pain around your port insertion site.  You have fluid or blood coming from your port insertion site.  Your port insertion site feels warm to the touch.  You have pus or a bad smell coming from the port insertion site. Get help right away if:  You have chest pain or shortness of breath.  You have bleeding from your port that you cannot control. Summary  Take care of the port as told by your   health care provider. Keep the manufacturer's information card with you at all times.  Change your dressing as told by your health care provider.  Contact a health care provider if you have a fever or chills or if you have redness, swelling, or pain around your port insertion site.  Keep all follow-up visits as told by your health care provider. This information is not intended to replace advice given to you by your health care provider. Make sure you discuss any questions you have with your health care provider. Document Revised: 11/14/2017 Document Reviewed: 11/14/2017 Elsevier Patient Education   2021 [employer]  

## 2020-06-26 NOTE — Progress Notes (Signed)
[place]   Telephone:([phone]) [phone] Fax:([phone]) [phone]   Clinic Follow up Note   Patient Care Team: Vernie Shanks, MD as PCP - General (Family Medicine) Fontaine, Belinda Block, MD (Inactive) as Consulting Physician (Gynecology) Truitt Merle, MD as Consulting Physician (Oncology) Jonnie Finner, RN as Registered Nurse Arta Silence, MD as Consulting Physician (Gastroenterology) Stark Klein, MD as Consulting Physician (General Surgery) Karie Mainland, RD as Dietitian (Nutrition)  Date of Service:  07/01/2020  CHIEF COMPLAINT: F/u of Pancreatic Cancer  SUMMARY OF ONCOLOGIC HISTORY: Oncology History Overview Note  Cancer Staging Pancreatic cancer [facility]) Staging form: Exocrine Pancreas, AJCC 8th Edition - Clinical stage from 10/16/2019: Stage IIB (cT2, cN1, cM0) - Signed by Truitt Merle, MD on 10/16/2019    Pancreatic cancer (Breanna Noble)  10/03/2019 Imaging   US Abdomen 10/03/19  IMPRESSION: Markedly dilated bile ducts. Dilated gallbladder with sludge but no gallstones.   2.9 cm mass in the uncinate process most likely pancreatic neoplasm causing biliary obstruction. Recommend MRI of the pancreas without with contrast for further evaluation. If the patient cannot have MRI, CT of the pancreas with contrast recommended.   10/07/2019 Imaging   MRI Abdomen 10/07/19  IMPRESSION: 1. There is a hypoenhancing mass of the pancreatic uncinate with abrupt obstruction of the central common bile duct, measuring approximately 2.9 cm, consistent with pancreatic adenocarcinoma. 2. Gross intra and extrahepatic biliary ductal dilatation, common bile duct measuring 1.4 cm. Gallbladder hydrops. 3. The pancreatic duct is nondilated. 4. Pancreatic mass lies closely adjacent to the most central portions of the superior mesenteric vein and central portal vein, due to motion artifact it is difficult to clearly discern whether there is a preserved fat plane. Multiphasic contrast  enhanced pancreatic protocol CT may be less motion sensitive and better delineate vascular structures for the purposes of surgical planning if necessary. 5. No evidence of lymphadenopathy or metastatic disease in the abdomen.   10/10/2019 Procedure   EUS by Dr Paulita Fujita 10/10/19  IMPRESSION - A mass was identified at junction of head/uncinate process of the pancreas. This was staged T2 N1 Mx by endosonographic criteria. Fine needle aspiration performed. - A few abnormal lymph nodes were visualized in the peripancreatic region. - There was dilation in the common bile duct which measured up to 14 mm. - Hyperechoic material consistent with sludge was visualized endosonographically in the gallbladder.   10/10/2019 Procedure   ERCP by Dr Paulita Fujita 10/10/19  IMPRESSION - The major papilla appeared normal. - One temporary stent was placed into the ventral pancreatic duct. - A biliary sphincterotomy was performed. - One covered metal stent was placed into the common bile duct.   10/10/2019 Initial Biopsy   FINAL MICROSCOPIC DIAGNOSIS: 10/10/19  A. PANCREAS, HEAD, FINE NEEDLE ASPIRATION:  - Malignant cells present, consistent with adenocarcinoma    10/16/2019 Initial Diagnosis   Pancreatic cancer ([place])   10/16/2019 Cancer Staging   Staging form: Exocrine Pancreas, AJCC 8th Edition - Clinical stage from 10/16/2019: Stage IIB (cT2, cN1, cM0) - Signed by Truitt Merle, MD on 10/16/2019   10/23/2019 Imaging   CT CAP W contrast  IMPRESSION: 1. Pancreatic mass with local adenopathy and potential retroperitoneal adenopathy. Given retroperitoneal lymph nodes and presence of venous collaterals PET scan may be useful for staging purposes. 2. No definite vascular involvement or signs of upper abdominal venous collaterals. Some stranding around the celiac may be due to recent biopsy. 3. Borderline enlarged RIGHT external iliac lymph node. Attention on follow-up. 4. No evidence  of metastatic disease in the  chest. 5. Signs of recent ERCP and stent placement.   10/25/2019 Procedure   PAC placed    11/09/2019 - 02/20/2020 Chemotherapy   Neoadjuvant FOLFIRINOX q2weeks starting 11/09/19-02/20/20 for 8 cycles   11/28/2019 Genetic Testing   Negative genetic testing on the common hereditary cancer panel, pancreatic cancer panel, preliminary evidence pancreatic cancer panel and chronic pancreatitis panel.  The Common Hereditary Gene Panel offered by [facility] includes sequencing and/or deletion duplication testing of the following 55 genes: APC*, ATM*, AXIN2, BARD1, BMPR1A, BRCA1, BRCA2, BRIP1, CASR, CDH1, CDK4, CDKN2A (p14ARF), CDKN2A (p16INK4a), CFTR*, CHEK2, CPA1, CTNNA1, CTRC, DICER1*, EPCAM*, FANCC, GREM1*, HOXB13, KIT, MEN1*, MLH1*, MSH2*, MSH3*, MSH6*, MUTYH, NBN, NF1*, NTHL1, PALB2, PALLD, PDGFRA, PMS2*, POLD1*, POLE, PRSS1*, PTEN*, RAD50, RAD51C, RAD51D, SDHA*, SDHB, SDHC*, SDHD, SMAD4, SMARCA4, SPINK1, STK11, TP53, TSC1*, TSC2, and VHL .  The report date is November 28, 2019.   01/28/2020 Imaging   CT AP w contrast  IMPRESSION: 1. No substantial interval change in exam. 2. Similar appearance of the pancreatic mass with associated main pancreatic ductal dilatation. 3. Soft tissue stranding again noted around the celiac axis, in the para-aortic retroperitoneal space and along the IMA (new in the interval). These findings are associated with similar appearance of upper normal to borderline hepatoduodenal ligament and retroperitoneal lymphadenopathy. Close continued attention on follow-up recommended. 4. Borderline enlarged right external iliac node identified on previous study has decreased in size in the interval.   02/14/2020 PET scan   IMPRESSION: 1. The pancreatic mass has a maximum SUV of 6.1. No findings of hypermetabolic local adenopathy or distant metastatic spread. 2. Splenomegaly. 3. Inspissated barium or appendicolith within the appendix. No signs of appendiceal inflammation. 4.  Scattered sigmoid colon diverticula. ADDENDUM: The original report was by Dr. Van Clines. The following addendum is by Dr. Van Clines:   On the CT from 01/28/2020 there were some upper normal sized/borderline retroperitoneal lymph nodes including a 0.8 cm aortocaval node on image 72 of series 9. This currently has a maximum SUV of 2.2, which is similar to blood pool activity. An indistinctly marginated lymph node just below the left renal vein has a maximum SUV of 2.4, just above blood pool levels., and measures about 1.0 cm in diameter. Lymph nodes anterior to the left psoas muscle have maximum SUV of 2.0, similar to blood pool. Overall these lymph nodes are borderline enlarged and with activity level at or just minimally above blood pool. While certainly not grossly positive, their proximity to the pancreatic head mass and borderline imaging characteristics make it difficult to exclude early nodal involvement by malignancy.   03/24/2020 Surgery   Whipple surgery, Laparoscopic Diagnostic, Appendectomy by Dr Barry Dienes    03/24/2020 Pathology Results   FINAL MICROSCOPIC DIAGNOSIS:   A. WHIPPLE PROCEDURE, WITH GALLBLADDER:  - Adenocarcinoma, moderately differentiated, spanning 1 cm.  - Tumor involves retroperitoneal peripancreatic soft tissue.  - Resection margins are negative.  - Treatment effect present.  - Rare tumor cells in one of fourteen lymph nodes (1/14).  - Benign gallbladder with chronic inflammation and benign lymph node.  - Bile duct with granulation tissue and reactive changes.  - Benign appendix.  - Mild reactive gastropathy.  - See oncology table.   B. LYMPH NODE, PORTAL, EXCISION:  - Three of three lymph nodes negative for carcinoma (0/3).   C. LYMPH NODE, COMMON HEPATIC ARTERY, EXCISION:  - Three of three lymph nodes negative for carcinoma (0/3).   D. APPENDIX, APPENDECTOMY:  -  Benign appendix.   E. LYMPH NODE, AORTOCAVAL, EXCISION:  - One of  one lymph node negative for carcinoma (0/1).    03/24/2020 Cancer Staging   Staging form: Exocrine Pancreas, AJCC 8th Edition - Pathologic stage from 03/24/2020: Stage IIB (pT1c, pN1, cM0) - Signed by Truitt Merle, MD on 04/16/2020   04/14/2020 Imaging   CT AP  IMPRESSION: 1. Interval Whipple procedure. Pancreatic stent in place. 2. Ill-defined soft tissue in the right abdominal wall extending from the subcutaneous tissues to the anterior abdominal wall musculature, but no focal fluid collection. This may represent postsurgical change, sterility indeterminate by imaging. 3. Pelvic anatomy is poorly defined, including pelvic bowel loops, adnexa, and colon. There appears to be sigmoid colonic wall thickening as well as intraluminal fluid, with additional diffuse colonic wall thickening from the transverse colon distally. Findings are suggestive of generalized colitis. Attention to this area on follow-up is recommended. 4. Small amount of non organized free fluid in the right upper quadrant. No evidence of intra-abdominal abscess. 5. Periportal edema, nonspecific. Previous biliary stent has been removed. Common bile duct is not well-defined on the current exam. 6. Mild splenomegaly. 7. Trace right pleural effusion.   05/19/2020 -  Chemotherapy   Adjuvant Gemcitabine 2 weeks on 1 week off starting 05/19/20 and Xeloda started on 05/25/2020. Xeloda dose currently at $RemoveBefo'1500mg'EJvCSwKezgK$  in the AM and $Remo'1000mg'KAWRE$  in the PM.        CURRENT THERAPY:  Adjuvant Gemcitabine 2 weeks on 1 week off starting 05/19/20 and Xeloda started on 05/25/2020. Xeloda dose currently at $RemoveBefo'1500mg'vqdjDcQIZBT$  in the AM and $Remo'1000mg'QEFaU$  in the PM.   INTERVAL HISTORY:  AITANNA HAUBNER is here for a follow up. She presents to the clinic alone. She notes dried blood on inside of nose, sores improved on inner lip and numbness of her finger tips.    REVIEW OF SYSTEMS:   Constitutional: Denies fevers, chills or abnormal weight loss Eyes: Denies blurriness of  vision Ears, nose, mouth, throat, and face: Denies mucositis or sore throat (+) Nose bleeding, mouth sores Respiratory: Denies cough, dyspnea or wheezes  Cardiovascular: Denies palpitation, chest discomfort or lower extremity swelling Gastrointestinal:  Denies nausea, heartburn or change in bowel habits Skin: Denies abnormal skin rashes Lymphatics: Denies new lymphadenopathy or easy bruising Neurological: Denies tingling or new weaknesses (+) Numbness of fingertips.  Behavioral/Psych: Mood is stable, no new changes  All other systems were reviewed with the patient and are negative.  MEDICAL HISTORY:  Past Medical History:  Diagnosis Date  . Cancer [facility])    pancreatic  . Cataracts, bilateral   . Diverticulosis 12/07/2001   found on colonoscopy  . Family history of colon cancer   . Family history of pancreatic cancer   . Hypothyroid 10/2007  . Macular hole   . Pancreatic mass   . PONV (postoperative nausea and vomiting)     SURGICAL HISTORY: Past Surgical History:  Procedure Laterality Date  . APPENDECTOMY N/A 03/24/2020   Procedure: APPENDECTOMY;  Surgeon: Stark Klein, MD;  Location: [place];  Service: General;  Laterality: N/A;  . BILIARY STENT PLACEMENT  10/10/2019   Procedure: BILIARY STENT PLACEMENT;  Surgeon: Arta Silence, MD;  Location: [place];  Service: Endoscopy;;  . CATARACT EXTRACTION, BILATERAL    . COLONOSCOPY    . ERCP N/A 10/10/2019   Procedure: ENDOSCOPIC RETROGRADE CHOLANGIOPANCREATOGRAPHY (ERCP);  Surgeon: Arta Silence, MD;  Location: [facility] ENDOSCOPY;  Service: Endoscopy;  Laterality: N/A;  . ESOPHAGOGASTRODUODENOSCOPY N/A 10/10/2019   Procedure:  ESOPHAGOGASTRODUODENOSCOPY (EGD);  Surgeon: Willis Modena, MD;  Location: [facility] ENDOSCOPY;  Service: Endoscopy;  Laterality: N/A;  . EUS N/A 10/10/2019   Procedure: UPPER ENDOSCOPIC ULTRASOUND (EUS) LINEAR;  Surgeon: Willis Modena, MD;  Location: [facility] ENDOSCOPY;  Service: Endoscopy;  Laterality: N/A;  . EYE  SURGERY    . FINE NEEDLE ASPIRATION  10/10/2019   Procedure: FINE NEEDLE ASPIRATION (FNA) LINEAR;  Surgeon: Willis Modena, MD;  Location: [facility] ENDOSCOPY;  Service: Endoscopy;;  . IR IMAGING GUIDED PORT INSERTION  10/25/2019  . LAPAROSCOPY N/A 03/24/2020   Procedure: LAPAROSCOPY DIAGNOSTIC;  Surgeon: Almond Lint, MD;  Location: [facility] OR;  Service: General;  Laterality: N/A;  . Macular hole surg    . PANCREATIC STENT PLACEMENT  10/10/2019   Procedure: PANCREATIC STENT PLACEMENT;  Surgeon: Willis Modena, MD;  Location: [facility] ENDOSCOPY;  Service: Endoscopy;;  . SPHINCTEROTOMY  10/10/2019   Procedure: Dennison Mascot;  Surgeon: Willis Modena, MD;  Location: [facility] ENDOSCOPY;  Service: Endoscopy;;  . TONSILLECTOMY    . TONSILLECTOMY    . WHIPPLE PROCEDURE N/A 03/24/2020   Procedure: WHIPPLE PROCEDURE;  Surgeon: Almond Lint, MD;  Location: [facility] OR;  Service: General;  Laterality: N/A;    I have reviewed the social history and family history with the patient and they are unchanged from previous note.  ALLERGIES:  has No Known Allergies.  MEDICATIONS:  Current Outpatient Medications  Medication Sig Dispense Refill  . Ascorbic Acid (VITAMIN C PO) Take 1,000 mg by mouth daily.     . B Complex Vitamins (B-COMPLEX/B-12 PO) Take by mouth. (Patient not taking: No sig reported)    . Calcium Carb-Cholecalciferol (CALCIUM 1000 + D PO) Take 1 tablet by mouth daily.     . capecitabine (XELODA) 500 MG tablet Take 3 tablets (1500 mg total) by mouth in morning and 2 tabs (1000 mg total) in evening. Take 14 days on, 7 days off, repeat every 21 days. Taken within 30 minutes of meals. 70 tablet 2  . diphenhydrAMINE (BENADRYL) 25 MG tablet Take 25 mg by mouth at bedtime.    . ferrous sulfate 325 (65 FE) MG tablet Take 325 mg by mouth daily with breakfast.    . ibuprofen (ADVIL) 200 MG tablet Take 400 mg by mouth every 6 (six) hours as needed for moderate pain.    Marland Kitchen levothyroxine (SYNTHROID, LEVOTHROID) 50 MCG tablet Take  1 tablet (50 mcg total) by mouth daily. 90 tablet 4  . lipase/protease/amylase (CREON) 36000 UNITS CPEP capsule Take 1 capsule (36,000 Units total) by mouth 3 (three) times daily with meals. May also take 1 capsule (36,000 Units total) as needed (with snacks). 50 capsule 0  . LORazepam (ATIVAN) 0.5 MG tablet Take 1 tablet (0.5 mg total) by mouth daily as needed for anxiety. For anticipatory n/v prior to chemo treatments (Patient not taking: No sig reported) 15 tablet 0  . Multiple Vitamin (MULTIVITAMIN PO) Take 1 tablet by mouth daily.    . mupirocin ointment (BACTROBAN) 2 % Place 1 application into the nose daily as needed (sore in nose). (Patient not taking: Reported on 03/17/2020)    . ondansetron (ZOFRAN) 4 MG tablet Take 1 tablet (4 mg total) by mouth every 8 (eight) hours as needed. 30 tablet 2  . oxyCODONE (OXY IR/ROXICODONE) 5 MG immediate release tablet Take 1 tablet (5 mg total) by mouth every 6 (six) hours as needed for moderate pain. 30 tablet 0  . pantoprazole (PROTONIX) 40 MG tablet Take 1 tablet (40 mg total) by  mouth daily with supper. 30 tablet 1  . potassium chloride SA (KLOR-CON) 20 MEQ tablet Take 1 tablet (20 mEq total) by mouth daily. 30 tablet 0  . prochlorperazine (COMPAZINE) 10 MG tablet TAKE 1 TABLET BY MOUTH EVERY 6 HOURS AS NEEDED FOR NAUSEA AND VOMITING 30 tablet 2  . vitamin B-12 (CYANOCOBALAMIN) 1000 MCG tablet Take 1,000 mcg by mouth daily.     No current facility-administered medications for this visit.   Facility-Administered Medications Ordered in Other Visits  Medication Dose Route Frequency Provider Last Rate Last Admin  . sodium chloride flush (NS) 0.9 % injection 10 mL  10 mL Intracatheter PRN Truitt Merle, MD   10 mL at 07/01/20 1234    PHYSICAL EXAMINATION: ECOG PERFORMANCE STATUS: 1 - Symptomatic but completely ambulatory  Vitals:   07/01/20 1027  BP: 115/63  Pulse: 67  Resp: 16  Temp: (!) 97.3 F (36.3 C)  SpO2: 100%   Filed Weights   07/01/20  1027  Weight: 109 lb 1.6 oz (49.5 kg)    Due to COVID19 we will limit examination to appearance. Patient had no complaints.  GENERAL:alert, no distress and comfortable SKIN: skin color normal, no rashes or significant lesions EYES: normal, Conjunctiva are pink and non-injected, sclera clear  NEURO: alert & oriented x 3 with fluent speech   LABORATORY DATA:  I have reviewed the data as listed CBC Latest Ref Rng & Units 07/01/2020 06/16/2020 06/09/2020  WBC 4.0 - 10.5 K/uL 4.3 3.4(L) 6.3  Hemoglobin 12.0 - 15.0 g/dL 10.1(L) 9.7(L) 10.2(L)  Hematocrit 36.0 - 46.0 % 32.4(L) 31.2(L) 32.7(L)  Platelets 150 - 400 K/uL 203 166 333     CMP Latest Ref Rng & Units 07/01/2020 06/16/2020 06/09/2020  Glucose 70 - 99 mg/dL 98 121(H) 102(H)  BUN 6 - 20 mg/dL $Remove'6 6 6  'GMHmvSP$ Creatinine 0.44 - 1.00 mg/dL 0.58 0.60 0.58  Sodium 135 - 145 mmol/L 137 138 136  Potassium 3.5 - 5.1 mmol/L 3.9 3.8 4.1  Chloride 98 - 111 mmol/L 104 103 102  CO2 22 - 32 mmol/L $RemoveB'26 27 27  'NIQodRSy$ Calcium 8.9 - 10.3 mg/dL 8.8(L) 9.0 9.0  Total Protein 6.5 - 8.1 g/dL 7.1 6.9 7.2  Total Bilirubin 0.3 - 1.2 mg/dL 0.3 0.3 0.3  Alkaline Phos 38 - 126 U/L 119 137(H) 137(H)  AST 15 - 41 U/L $Remo'28 25 22  'OniyV$ ALT 0 - 44 U/L $Remo'19 17 14      'rnimd$ RADIOGRAPHIC STUDIES: I have personally reviewed the radiological images as listed and agreed with the findings in the report. No results found.   ASSESSMENT & PLAN:  Breanna Noble is a 60 y.o. female with    1. Pancreatic Cancer, Stage IIB,cT2N1M0, ypT1N1 -She was diagnosed in 10/2019.Her initialUS showed bile duct and gallbladder dilation due to 2-3cm mass at head of pancreas/Uncinate. Her MRI showed her mass is laying close to SMV but not involving it. Her CT CAP from 10/23/19 showedborderline potential adenopathy in the periaortic region which will be followed on restaging scans. There is no definite vascular involvementor distant metastasis. -She underwent EUS and ERCP on 10/10/19 and had 2 stents placed by Dr  Paulita Fujita. Herpancreatic mass biopsyshowed Adenocarcinoma of Pancreas.Endoscopic staging T2N1, noevidence of vascular involvement. -She completed 8 cycles of neoadjuvant FOLFIRINOX7/10/21-10/21/21,tolerated moderately well -She underwent Whipple surgery with Dr Barry Dienes on 03/24/20.Pathshoweda residual 1 cm moderately differentiated adenocarcinoma, and 1 out of 14 positive lymph nodes. Surgical margins were negative. Lymph nodes biopsy from portal, hepatic artery,  and aortocaval were all negative. -We discussed her that she had good response to neoadjuvant chemotherapy, but still has significant residual disease, especially positive lymph nodes, which will predict high risk for recurrence after surgery. -I recommended additional chemo with Adjuvant Gemcitabine 2 weeks on 1 week off starting 05/19/20 and Xeloda started on 05/25/2020. Plan for 4 cycles, then scan her. Xeloda dose currently at $RemoveBefo'1500mg'JaGPFpYtsyJ$  in the AM and $Remo'1000mg'TtKiH$  in the PM.  -S/p C2 she is tolerating moderately well with some skin toxicity and mild numbness of fingertips.  -Labs reviewed and adequate to proceed with C3D1 Gemcitabine today and day 8 next week.  -Start Day 1 Xeloda today  -F/u in 3 weeks with start of final cycle chemo. Will order scan at that time.   3. Upper Abdominal Pain,Weight loss, Nausea -Presented with initial jaundice but since stent placement her pain hasmostlyresolvedand only occasional. -Nauseafrom chemois controlled on Compazineand Zofran. Her weight is maintained onBoost and premier proteinand adequate eating.If she loses more weight I recommend she increase supplements. -She will continue to f/u with dietician.Stable.  4. Genetic Testingin 10/2019 was negative for pathogenetic mutations.   5. Pancytopenia, secondary to chemo -will monitor closely  -Improved after C2 with normal WBC, ANC and PLT. Hg at 10.1 (07/01/20)   PLAN:  -Labs reviewed and adequate to proceed with C3D1 Gemcitabine  today -Proceed with D1 Xeloda today at $Remove'1500mg'lXzmCPY$  in the AM, $Remov'1000mg'SvIVFb$  in the PM 2 weeks on/1 week off starting today -Lab, flush, Gemcitabine in 1, 3, 4 weeks  -F/u in 3 weeks. Will order CT scan on next visit    No problem-specific Assessment & Plan notes found for this encounter.   No orders of the defined types were placed in this encounter.  All questions were answered. The patient knows to call the clinic with any problems, questions or concerns. No barriers to learning was detected. The total time spent in the appointment was 30 minutes.     Truitt Merle, MD 07/01/2020   I, Joslyn Devon, am acting as scribe for Truitt Merle, MD.   I have reviewed the above documentation for accuracy and completeness, and I agree with the above.

## 2020-07-01 ENCOUNTER — Inpatient Hospital Stay (HOSPITAL_BASED_OUTPATIENT_CLINIC_OR_DEPARTMENT_OTHER): Payer: BC Managed Care – PPO | Admitting: Hematology

## 2020-07-01 ENCOUNTER — Other Ambulatory Visit: Payer: Self-pay

## 2020-07-01 ENCOUNTER — Encounter: Payer: Self-pay | Admitting: Hematology

## 2020-07-01 ENCOUNTER — Inpatient Hospital Stay: Payer: BC Managed Care – PPO | Attending: Hematology

## 2020-07-01 ENCOUNTER — Inpatient Hospital Stay: Payer: BC Managed Care – PPO

## 2020-07-01 ENCOUNTER — Encounter: Payer: BC Managed Care – PPO | Admitting: Nutrition

## 2020-07-01 VITALS — BP 115/63 | HR 67 | Temp 97.3°F | Resp 16 | Ht 65.0 in | Wt 109.1 lb

## 2020-07-01 DIAGNOSIS — C250 Malignant neoplasm of head of pancreas: Secondary | ICD-10-CM

## 2020-07-01 DIAGNOSIS — C257 Malignant neoplasm of other parts of pancreas: Secondary | ICD-10-CM | POA: Insufficient documentation

## 2020-07-01 DIAGNOSIS — Z5111 Encounter for antineoplastic chemotherapy: Secondary | ICD-10-CM | POA: Diagnosis not present

## 2020-07-01 DIAGNOSIS — D61810 Antineoplastic chemotherapy induced pancytopenia: Secondary | ICD-10-CM | POA: Diagnosis not present

## 2020-07-01 DIAGNOSIS — Z95828 Presence of other vascular implants and grafts: Secondary | ICD-10-CM

## 2020-07-01 LAB — CBC WITH DIFFERENTIAL (CANCER CENTER ONLY)
Abs Immature Granulocytes: 0.01 10*3/uL (ref 0.00–0.07)
Basophils Absolute: 0 10*3/uL (ref 0.0–0.1)
Basophils Relative: 1 %
Eosinophils Absolute: 0.1 10*3/uL (ref 0.0–0.5)
Eosinophils Relative: 2 %
HCT: 32.4 % — ABNORMAL LOW (ref 36.0–46.0)
Hemoglobin: 10.1 g/dL — ABNORMAL LOW (ref 12.0–15.0)
Immature Granulocytes: 0 %
Lymphocytes Relative: 38 %
Lymphs Abs: 1.6 10*3/uL (ref 0.7–4.0)
MCH: 28.6 pg (ref 26.0–34.0)
MCHC: 31.2 g/dL (ref 30.0–36.0)
MCV: 91.8 fL (ref 80.0–100.0)
Monocytes Absolute: 0.8 10*3/uL (ref 0.1–1.0)
Monocytes Relative: 17 %
Neutro Abs: 1.8 10*3/uL (ref 1.7–7.7)
Neutrophils Relative %: 42 %
Platelet Count: 203 10*3/uL (ref 150–400)
RBC: 3.53 MIL/uL — ABNORMAL LOW (ref 3.87–5.11)
RDW: 21.2 % — ABNORMAL HIGH (ref 11.5–15.5)
WBC Count: 4.3 10*3/uL (ref 4.0–10.5)
nRBC: 0 % (ref 0.0–0.2)

## 2020-07-01 LAB — CMP (CANCER CENTER ONLY)
ALT: 19 U/L (ref 0–44)
AST: 28 U/L (ref 15–41)
Albumin: 3.7 g/dL (ref 3.5–5.0)
Alkaline Phosphatase: 119 U/L (ref 38–126)
Anion gap: 7 (ref 5–15)
BUN: 6 mg/dL (ref 6–20)
CO2: 26 mmol/L (ref 22–32)
Calcium: 8.8 mg/dL — ABNORMAL LOW (ref 8.9–10.3)
Chloride: 104 mmol/L (ref 98–111)
Creatinine: 0.58 mg/dL (ref 0.44–1.00)
GFR, Estimated: >60 mL/min (ref >60)
Glucose, Bld: 98 mg/dL (ref 70–99)
Potassium: 3.9 mmol/L (ref 3.5–5.1)
Sodium: 137 mmol/L (ref 135–145)
Total Bilirubin: 0.3 mg/dL (ref 0.3–1.2)
Total Protein: 7.1 g/dL (ref 6.5–8.1)

## 2020-07-01 MED ORDER — HEPARIN SOD (PORK) LOCK FLUSH 100 UNIT/ML IV SOLN
500.0000 [IU] | Freq: Once | INTRAVENOUS | Status: AC | PRN
  Administered 2020-07-01: 500 [IU]
  Filled 2020-07-01: qty 5

## 2020-07-01 MED ORDER — SODIUM CHLORIDE 0.9 % IV SOLN
1000.0000 mg/m2 | Freq: Once | INTRAVENOUS | Status: AC
  Administered 2020-07-01: 1520 mg via INTRAVENOUS
  Filled 2020-07-01: qty 39.98

## 2020-07-01 MED ORDER — SODIUM CHLORIDE 0.9% FLUSH
10.0000 mL | INTRAVENOUS | Status: DC | PRN
  Administered 2020-07-01: 10 mL
  Filled 2020-07-01: qty 10

## 2020-07-01 MED ORDER — PROCHLORPERAZINE MALEATE 10 MG PO TABS
ORAL_TABLET | ORAL | Status: AC
  Filled 2020-07-01: qty 1

## 2020-07-01 MED ORDER — SODIUM CHLORIDE 0.9% FLUSH
10.0000 mL | Freq: Once | INTRAVENOUS | Status: AC
  Administered 2020-07-01: 10 mL
  Filled 2020-07-01: qty 10

## 2020-07-01 MED ORDER — PROCHLORPERAZINE MALEATE 10 MG PO TABS
10.0000 mg | ORAL_TABLET | Freq: Once | ORAL | Status: AC
  Administered 2020-07-01: 10 mg via ORAL

## 2020-07-01 MED ORDER — SODIUM CHLORIDE 0.9 % IV SOLN
Freq: Once | INTRAVENOUS | Status: AC
  Filled 2020-07-01: qty 250

## 2020-07-01 NOTE — Patient Instructions (Signed)
Gemcitabine injection What is this medicine? GEMCITABINE (jem SYE ta been) is a chemotherapy drug. This medicine is used to treat many types of cancer like breast cancer, lung cancer, pancreatic cancer, and ovarian cancer. This medicine may be used for other purposes; ask your health care provider or pharmacist if you have questions. COMMON BRAND NAME(S): Gemzar, Infugem What should I tell my health care provider before I take this medicine? They need to know if you have any of these conditions:  blood disorders  infection  kidney disease  liver disease  lung or breathing disease, like asthma  recent or ongoing radiation therapy  an unusual or allergic reaction to gemcitabine, other chemotherapy, other medicines, foods, dyes, or preservatives  pregnant or trying to get pregnant  breast-feeding How should I use this medicine? This drug is given as an infusion into a vein. It is administered in a hospital or clinic by a specially trained health care professional. Talk to your pediatrician regarding the use of this medicine in children. Special care may be needed. Overdosage: If you think you have taken too much of this medicine contact a poison control center or emergency room at once. NOTE: This medicine is only for you. Do not share this medicine with others. What if I miss a dose? It is important not to miss your dose. Call your doctor or health care professional if you are unable to keep an appointment. What may interact with this medicine?  medicines to increase blood counts like filgrastim, pegfilgrastim, sargramostim  some other chemotherapy drugs like cisplatin  vaccines Talk to your doctor or health care professional before taking any of these medicines:  acetaminophen  aspirin  ibuprofen  ketoprofen  naproxen This list may not describe all possible interactions. Give your health care provider a list of all the medicines, herbs, non-prescription drugs, or  dietary supplements you use. Also tell them if you smoke, drink alcohol, or use illegal drugs. Some items may interact with your medicine. What should I watch for while using this medicine? Visit your doctor for checks on your progress. This drug may make you feel generally unwell. This is not uncommon, as chemotherapy can affect healthy cells as well as cancer cells. Report any side effects. Continue your course of treatment even though you feel ill unless your doctor tells you to stop. In some cases, you may be given additional medicines to help with side effects. Follow all directions for their use. Call your doctor or health care professional for advice if you get a fever, chills or sore throat, or other symptoms of a cold or flu. Do not treat yourself. This drug decreases your body's ability to fight infections. Try to avoid being around people who are sick. This medicine may increase your risk to bruise or bleed. Call your doctor or health care professional if you notice any unusual bleeding. Be careful brushing and flossing your teeth or using a toothpick because you may get an infection or bleed more easily. If you have any dental work done, tell your dentist you are receiving this medicine. Avoid taking products that contain aspirin, acetaminophen, ibuprofen, naproxen, or ketoprofen unless instructed by your doctor. These medicines may hide a fever. Do not become pregnant while taking this medicine or for 6 months after stopping it. Women should inform their doctor if they wish to become pregnant or think they might be pregnant. Men should not father a child while taking this medicine and for 3 months after stopping it.   There is a potential for serious side effects to an unborn child. Talk to your health care professional or pharmacist for more information. Do not breast-feed an infant while taking this medicine or for at least 1 week after stopping it. Men should inform their doctors if they wish  to father a child. This medicine may lower sperm counts. Talk with your doctor or health care professional if you are concerned about your fertility. What side effects may I notice from receiving this medicine? Side effects that you should report to your doctor or health care professional as soon as possible:  allergic reactions like skin rash, itching or hives, swelling of the face, lips, or tongue  breathing problems  pain, redness, or irritation at site where injected  signs and symptoms of a dangerous change in heartbeat or heart rhythm like chest pain; dizziness; fast or irregular heartbeat; palpitations; feeling faint or lightheaded, falls; breathing problems  signs of decreased platelets or bleeding - bruising, pinpoint red spots on the skin, black, tarry stools, blood in the urine  signs of decreased red blood cells - unusually weak or tired, feeling faint or lightheaded, falls  signs of infection - fever or chills, cough, sore throat, pain or difficulty passing urine  signs and symptoms of kidney injury like trouble passing urine or change in the amount of urine  signs and symptoms of liver injury like dark yellow or brown urine; general ill feeling or flu-like symptoms; light-colored stools; loss of appetite; nausea; right upper belly pain; unusually weak or tired; yellowing of the eyes or skin  swelling of ankles, feet, hands Side effects that usually do not require medical attention (report to your doctor or health care professional if they continue or are bothersome):  constipation  diarrhea  hair loss  loss of appetite  nausea  rash  vomiting This list may not describe all possible side effects. Call your doctor for medical advice about side effects. You may report side effects to FDA at 1-800-FDA-1088. Where should I keep my medicine? This drug is given in a hospital or clinic and will not be stored at home. NOTE: This sheet is a summary. It may not cover all  possible information. If you have questions about this medicine, talk to your doctor, pharmacist, or health care provider.  2021 [employer] (2017-07-12 18:06:11)  

## 2020-07-01 NOTE — Patient Instructions (Signed)

## 2020-07-02 ENCOUNTER — Telehealth: Payer: Self-pay | Admitting: Hematology

## 2020-07-02 LAB — CANCER ANTIGEN 19-9: CA 19-9: 29 U/mL (ref 0–35)

## 2020-07-02 NOTE — Telephone Encounter (Signed)
Checked out appointment. No LOS notes needing to be scheduled. No changes made. 

## 2020-07-07 ENCOUNTER — Inpatient Hospital Stay: Payer: BC Managed Care – PPO

## 2020-07-07 ENCOUNTER — Other Ambulatory Visit: Payer: Self-pay

## 2020-07-07 ENCOUNTER — Inpatient Hospital Stay: Payer: BC Managed Care – PPO | Admitting: Nutrition

## 2020-07-07 VITALS — BP 103/51 | HR 63 | Temp 98.2°F | Resp 17

## 2020-07-07 DIAGNOSIS — Z5111 Encounter for antineoplastic chemotherapy: Secondary | ICD-10-CM | POA: Diagnosis not present

## 2020-07-07 DIAGNOSIS — Z95828 Presence of other vascular implants and grafts: Secondary | ICD-10-CM

## 2020-07-07 DIAGNOSIS — C250 Malignant neoplasm of head of pancreas: Secondary | ICD-10-CM

## 2020-07-07 DIAGNOSIS — D61810 Antineoplastic chemotherapy induced pancytopenia: Secondary | ICD-10-CM | POA: Diagnosis not present

## 2020-07-07 DIAGNOSIS — C257 Malignant neoplasm of other parts of pancreas: Secondary | ICD-10-CM | POA: Diagnosis not present

## 2020-07-07 LAB — CMP (CANCER CENTER ONLY)
ALT: 23 U/L (ref 0–44)
AST: 32 U/L (ref 15–41)
Albumin: 3.6 g/dL (ref 3.5–5.0)
Alkaline Phosphatase: 134 U/L — ABNORMAL HIGH (ref 38–126)
Anion gap: 6 (ref 5–15)
BUN: 6 mg/dL (ref 6–20)
CO2: 28 mmol/L (ref 22–32)
Calcium: 8.8 mg/dL — ABNORMAL LOW (ref 8.9–10.3)
Chloride: 104 mmol/L (ref 98–111)
Creatinine: 0.56 mg/dL (ref 0.44–1.00)
GFR, Estimated: >60 mL/min (ref >60)
Glucose, Bld: 118 mg/dL — ABNORMAL HIGH (ref 70–99)
Potassium: 3.6 mmol/L (ref 3.5–5.1)
Sodium: 138 mmol/L (ref 135–145)
Total Bilirubin: 0.3 mg/dL (ref 0.3–1.2)
Total Protein: 6.8 g/dL (ref 6.5–8.1)

## 2020-07-07 LAB — CBC WITH DIFFERENTIAL (CANCER CENTER ONLY)
Abs Immature Granulocytes: 0 10*3/uL (ref 0.00–0.07)
Basophils Absolute: 0 10*3/uL (ref 0.0–0.1)
Basophils Relative: 1 %
Eosinophils Absolute: 0 10*3/uL (ref 0.0–0.5)
Eosinophils Relative: 1 %
HCT: 30.4 % — ABNORMAL LOW (ref 36.0–46.0)
Hemoglobin: 9.7 g/dL — ABNORMAL LOW (ref 12.0–15.0)
Immature Granulocytes: 0 %
Lymphocytes Relative: 44 %
Lymphs Abs: 1.1 10*3/uL (ref 0.7–4.0)
MCH: 29 pg (ref 26.0–34.0)
MCHC: 31.9 g/dL (ref 30.0–36.0)
MCV: 91 fL (ref 80.0–100.0)
Monocytes Absolute: 0.3 10*3/uL (ref 0.1–1.0)
Monocytes Relative: 14 %
Neutro Abs: 1 10*3/uL — ABNORMAL LOW (ref 1.7–7.7)
Neutrophils Relative %: 40 %
Platelet Count: 144 10*3/uL — ABNORMAL LOW (ref 150–400)
RBC: 3.34 MIL/uL — ABNORMAL LOW (ref 3.87–5.11)
RDW: 20 % — ABNORMAL HIGH (ref 11.5–15.5)
WBC Count: 2.5 10*3/uL — ABNORMAL LOW (ref 4.0–10.5)
nRBC: 0 % (ref 0.0–0.2)

## 2020-07-07 MED ORDER — SODIUM CHLORIDE 0.9 % IV SOLN
Freq: Once | INTRAVENOUS | Status: AC
  Filled 2020-07-07: qty 250

## 2020-07-07 MED ORDER — PROCHLORPERAZINE MALEATE 10 MG PO TABS
ORAL_TABLET | ORAL | Status: AC
  Filled 2020-07-07: qty 1

## 2020-07-07 MED ORDER — PROCHLORPERAZINE MALEATE 10 MG PO TABS
10.0000 mg | ORAL_TABLET | Freq: Once | ORAL | Status: AC
  Administered 2020-07-07: 10 mg via ORAL

## 2020-07-07 MED ORDER — SODIUM CHLORIDE 0.9% FLUSH
10.0000 mL | Freq: Once | INTRAVENOUS | Status: AC
  Administered 2020-07-07: 10 mL
  Filled 2020-07-07: qty 10

## 2020-07-07 MED ORDER — SODIUM CHLORIDE 0.9 % IV SOLN
1000.0000 mg/m2 | Freq: Once | INTRAVENOUS | Status: AC
  Administered 2020-07-07: 1520 mg via INTRAVENOUS
  Filled 2020-07-07: qty 39.98

## 2020-07-07 NOTE — Patient Instructions (Signed)
Gemcitabine injection What is this medicine? GEMCITABINE (jem SYE ta been) is a chemotherapy drug. This medicine is used to treat many types of cancer like breast cancer, lung cancer, pancreatic cancer, and ovarian cancer. This medicine may be used for other purposes; ask your health care provider or pharmacist if you have questions. COMMON BRAND NAME(S): Gemzar, Infugem What should I tell my health care provider before I take this medicine? They need to know if you have any of these conditions:  blood disorders  infection  kidney disease  liver disease  lung or breathing disease, like asthma  recent or ongoing radiation therapy  an unusual or allergic reaction to gemcitabine, other chemotherapy, other medicines, foods, dyes, or preservatives  pregnant or trying to get pregnant  breast-feeding How should I use this medicine? This drug is given as an infusion into a vein. It is administered in a hospital or clinic by a specially trained health care professional. Talk to your pediatrician regarding the use of this medicine in children. Special care may be needed. Overdosage: If you think you have taken too much of this medicine contact a poison control center or emergency room at once. NOTE: This medicine is only for you. Do not share this medicine with others. What if I miss a dose? It is important not to miss your dose. Call your doctor or health care professional if you are unable to keep an appointment. What may interact with this medicine?  medicines to increase blood counts like filgrastim, pegfilgrastim, sargramostim  some other chemotherapy drugs like cisplatin  vaccines Talk to your doctor or health care professional before taking any of these medicines:  acetaminophen  aspirin  ibuprofen  ketoprofen  naproxen This list may not describe all possible interactions. Give your health care provider a list of all the medicines, herbs, non-prescription drugs, or  dietary supplements you use. Also tell them if you smoke, drink alcohol, or use illegal drugs. Some items may interact with your medicine. What should I watch for while using this medicine? Visit your doctor for checks on your progress. This drug may make you feel generally unwell. This is not uncommon, as chemotherapy can affect healthy cells as well as cancer cells. Report any side effects. Continue your course of treatment even though you feel ill unless your doctor tells you to stop. In some cases, you may be given additional medicines to help with side effects. Follow all directions for their use. Call your doctor or health care professional for advice if you get a fever, chills or sore throat, or other symptoms of a cold or flu. Do not treat yourself. This drug decreases your body's ability to fight infections. Try to avoid being around people who are sick. This medicine may increase your risk to bruise or bleed. Call your doctor or health care professional if you notice any unusual bleeding. Be careful brushing and flossing your teeth or using a toothpick because you may get an infection or bleed more easily. If you have any dental work done, tell your dentist you are receiving this medicine. Avoid taking products that contain aspirin, acetaminophen, ibuprofen, naproxen, or ketoprofen unless instructed by your doctor. These medicines may hide a fever. Do not become pregnant while taking this medicine or for 6 months after stopping it. Women should inform their doctor if they wish to become pregnant or think they might be pregnant. Men should not father a child while taking this medicine and for 3 months after stopping it.   There is a potential for serious side effects to an unborn child. Talk to your health care professional or pharmacist for more information. Do not breast-feed an infant while taking this medicine or for at least 1 week after stopping it. Men should inform their doctors if they wish  to father a child. This medicine may lower sperm counts. Talk with your doctor or health care professional if you are concerned about your fertility. What side effects may I notice from receiving this medicine? Side effects that you should report to your doctor or health care professional as soon as possible:  allergic reactions like skin rash, itching or hives, swelling of the face, lips, or tongue  breathing problems  pain, redness, or irritation at site where injected  signs and symptoms of a dangerous change in heartbeat or heart rhythm like chest pain; dizziness; fast or irregular heartbeat; palpitations; feeling faint or lightheaded, falls; breathing problems  signs of decreased platelets or bleeding - bruising, pinpoint red spots on the skin, black, tarry stools, blood in the urine  signs of decreased red blood cells - unusually weak or tired, feeling faint or lightheaded, falls  signs of infection - fever or chills, cough, sore throat, pain or difficulty passing urine  signs and symptoms of kidney injury like trouble passing urine or change in the amount of urine  signs and symptoms of liver injury like dark yellow or brown urine; general ill feeling or flu-like symptoms; light-colored stools; loss of appetite; nausea; right upper belly pain; unusually weak or tired; yellowing of the eyes or skin  swelling of ankles, feet, hands Side effects that usually do not require medical attention (report to your doctor or health care professional if they continue or are bothersome):  constipation  diarrhea  hair loss  loss of appetite  nausea  rash  vomiting This list may not describe all possible side effects. Call your doctor for medical advice about side effects. You may report side effects to FDA at 1-800-FDA-1088. Where should I keep my medicine? This drug is given in a hospital or clinic and will not be stored at home. NOTE: This sheet is a summary. It may not cover all  possible information. If you have questions about this medicine, talk to your doctor, pharmacist, or health care provider.  2021 [employer] (2017-07-12 18:06:11)  

## 2020-07-07 NOTE — Progress Notes (Signed)
Ok to treat per Dr. Lindi Adie with [place] of 1.0 today

## 2020-07-07 NOTE — Progress Notes (Signed)
Brief nutrition follow-up completed with patient during infusion for pancreas cancer. Patient status post Whipple procedure on November 23. Weight stable and documented as 109.1 pounds March 2. Patient has started Xeloda. She states she has a little bit of nausea but does not need to take medications. She denies diarrhea and constipation. She struggles to drink milk-based nutrition supplements.  Nutrition diagnosis: Severe malnutrition continues.  Intervention: Continue strategies for increased calorie and protein intake. Begin clear liquid oral nutrition supplements such as Ensure clear.  Drink 2 daily.  Provided coupons. Medications as needed. Bowel regimen as needed.  Monitoring, evaluation, goals: Patient will tolerate adequate calories and protein to minimize weight loss.  Next visit: To be scheduled as needed.  **Disclaimer: This note was dictated with voice recognition software. Similar sounding words can inadvertently be transcribed and this note may contain transcription errors which may not have been corrected upon publication of note.**

## 2020-07-13 ENCOUNTER — Other Ambulatory Visit: Payer: Self-pay | Admitting: Hematology

## 2020-07-13 ENCOUNTER — Telehealth: Payer: Self-pay | Admitting: Pharmacist

## 2020-07-13 DIAGNOSIS — C250 Malignant neoplasm of head of pancreas: Secondary | ICD-10-CM

## 2020-07-13 MED ORDER — CAPECITABINE 500 MG PO TABS: ORAL_TABLET | ORAL | 0 refills | Status: DC

## 2020-07-13 NOTE — Telephone Encounter (Signed)
OK, I will refill for her today.   Truitt Merle MD

## 2020-07-13 NOTE — Telephone Encounter (Signed)
Oral Chemotherapy Pharmacist Encounter   Spoke with patient today to follow up regarding patient's oral chemotherapy medication: Xeloda (capecitabine)  Original Start date of oral chemotherapy: 05/25/20 (patient took 3 tablets the AM of 05/19/20, but was instructed to hold Xeloda and resume Xeloda on days 8-14 of cycle 1)  Pt reports 0 tablets/doses of Xeloda missed in the last cycle.   Returned patient's call inquiring her Xeloda cycle instructions. Patient stated pharmacist at [place] instructed patient she took Xeloda for 14 days on, 10 days off. Reviewed and re-educated patient that she takes Xeloda for 14 days on 7 days off. Patient started Cycle 3 of Xeloda on 07/01/20 - she will finish her 14 days of Xeloda for this current cycle on 07/14/2020 and then knows to not take Xeloda for 7 days. Her 4th cycle will be begin 07/22/20. Patient expressed understanding.   Additionally, patient reports that she has no refills left on current Xeloda prescription. Once she completes cycle 3, patient stated she will have 32 extra tablets on hand from when she was instructed to delay start of her 1st cycle in January. Patient will only need 38 tablets for next cycle of Xeloda sent [place].   Patient knows to call the office with questions or concerns.  Leron Croak, PharmD, BCPS Hematology/Oncology Clinical Pharmacist [facility] [phone] 07/13/2020 10:01 AM

## 2020-07-17 NOTE — Progress Notes (Signed)
[place]   [phone] [phone]   Clinic Follow up Note   Patient Care Team: Vernie Shanks, MD as PCP - General (Family Medicine) Fontaine, Belinda Block, MD (Inactive) as Consulting Physician (Gynecology) Truitt Merle, MD as Consulting Physician (Oncology) Jonnie Finner, RN as Registered Nurse Arta Silence, MD as Consulting Physician (Gastroenterology) Stark Klein, MD as Consulting Physician (General Surgery) Karie Mainland, RD as Dietitian (Nutrition)  Date of Service:  07/22/2020  CHIEF COMPLAINT: F/u of Pancreatic Cancer  SUMMARY OF ONCOLOGIC HISTORY: Oncology History Overview Note  Cancer Staging Pancreatic cancer [facility]) Staging form: Exocrine Pancreas, AJCC 8th Edition - Clinical stage from 10/16/2019: Stage IIB (cT2, cN1, cM0) - Signed by Truitt Merle, MD on 10/16/2019    Pancreatic cancer ([place])  10/03/2019 Imaging   US Abdomen 10/03/19  IMPRESSION: Markedly dilated bile ducts. Dilated gallbladder with sludge but no gallstones.   2.9 cm mass in the uncinate process most likely pancreatic neoplasm causing biliary obstruction. Recommend MRI of the pancreas without with contrast for further evaluation. If the patient cannot have MRI, CT of the pancreas with contrast recommended.   10/07/2019 Imaging   MRI Abdomen 10/07/19  IMPRESSION: 1. There is a hypoenhancing mass of the pancreatic uncinate with abrupt obstruction of the central common bile duct, measuring approximately 2.9 cm, consistent with pancreatic adenocarcinoma. 2. Gross intra and extrahepatic biliary ductal dilatation, common bile duct measuring 1.4 cm. Gallbladder hydrops. 3. The pancreatic duct is nondilated. 4. Pancreatic mass lies closely adjacent to the most central portions of the superior mesenteric vein and central portal vein, due to motion artifact it is difficult to clearly discern whether there is a preserved fat plane. Multiphasic contrast  enhanced pancreatic protocol CT may be less motion sensitive and better delineate vascular structures for the purposes of surgical planning if necessary. 5. No evidence of lymphadenopathy or metastatic disease in the abdomen.   10/10/2019 Procedure   EUS by Dr Paulita Fujita 10/10/19  IMPRESSION - A mass was identified at junction of head/uncinate process of the pancreas. This was staged T2 N1 Mx by endosonographic criteria. Fine needle aspiration performed. - A few abnormal lymph nodes were visualized in the peripancreatic region. - There was dilation in the common bile duct which measured up to 14 mm. - Hyperechoic material consistent with sludge was visualized endosonographically in the gallbladder.   10/10/2019 Procedure   ERCP by Dr Paulita Fujita 10/10/19  IMPRESSION - The major papilla appeared normal. - One temporary stent was placed into the ventral pancreatic duct. - A biliary sphincterotomy was performed. - One covered metal stent was placed into the common bile duct.   10/10/2019 Initial Biopsy   FINAL MICROSCOPIC DIAGNOSIS: 10/10/19  A. PANCREAS, HEAD, FINE NEEDLE ASPIRATION:  - Malignant cells present, consistent with adenocarcinoma    10/16/2019 Initial Diagnosis   Pancreatic cancer ([place])   10/16/2019 Cancer Staging   Staging form: Exocrine Pancreas, AJCC 8th Edition - Clinical stage from 10/16/2019: Stage IIB (cT2, cN1, cM0) - Signed by Truitt Merle, MD on 10/16/2019   10/23/2019 Imaging   CT CAP W contrast  IMPRESSION: 1. Pancreatic mass with local adenopathy and potential retroperitoneal adenopathy. Given retroperitoneal lymph nodes and presence of venous collaterals PET scan may be useful for staging purposes. 2. No definite vascular involvement or signs of upper abdominal venous collaterals. Some stranding around the celiac may be due to recent biopsy. 3. Borderline enlarged RIGHT external iliac lymph node. Attention on follow-up. 4. No evidence  of metastatic disease in the  chest. 5. Signs of recent ERCP and stent placement.   10/25/2019 Procedure   PAC placed    11/09/2019 - 02/20/2020 Chemotherapy   Neoadjuvant FOLFIRINOX q2weeks starting 11/09/19-02/20/20 for 8 cycles   11/28/2019 Genetic Testing   Negative genetic testing on the common hereditary cancer panel, pancreatic cancer panel, preliminary evidence pancreatic cancer panel and chronic pancreatitis panel.  The Common Hereditary Gene Panel offered by [facility] includes sequencing and/or deletion duplication testing of the following 55 genes: APC*, ATM*, AXIN2, BARD1, BMPR1A, BRCA1, BRCA2, BRIP1, CASR, CDH1, CDK4, CDKN2A (p14ARF), CDKN2A (p16INK4a), CFTR*, CHEK2, CPA1, CTNNA1, CTRC, DICER1*, EPCAM*, FANCC, GREM1*, HOXB13, KIT, MEN1*, MLH1*, MSH2*, MSH3*, MSH6*, MUTYH, NBN, NF1*, NTHL1, PALB2, PALLD, PDGFRA, PMS2*, POLD1*, POLE, PRSS1*, PTEN*, RAD50, RAD51C, RAD51D, SDHA*, SDHB, SDHC*, SDHD, SMAD4, SMARCA4, SPINK1, STK11, TP53, TSC1*, TSC2, and VHL .  The report date is November 28, 2019.   01/28/2020 Imaging   CT AP w contrast  IMPRESSION: 1. No substantial interval change in exam. 2. Similar appearance of the pancreatic mass with associated main pancreatic ductal dilatation. 3. Soft tissue stranding again noted around the celiac axis, in the para-aortic retroperitoneal space and along the IMA (new in the interval). These findings are associated with similar appearance of upper normal to borderline hepatoduodenal ligament and retroperitoneal lymphadenopathy. Close continued attention on follow-up recommended. 4. Borderline enlarged right external iliac node identified on previous study has decreased in size in the interval.   02/14/2020 PET scan   IMPRESSION: 1. The pancreatic mass has a maximum SUV of 6.1. No findings of hypermetabolic local adenopathy or distant metastatic spread. 2. Splenomegaly. 3. Inspissated barium or appendicolith within the appendix. No signs of appendiceal inflammation. 4.  Scattered sigmoid colon diverticula. ADDENDUM: The original report was by Dr. Van Clines. The following addendum is by Dr. Van Clines:   On the CT from 01/28/2020 there were some upper normal sized/borderline retroperitoneal lymph nodes including a 0.8 cm aortocaval node on image 72 of series 9. This currently has a maximum SUV of 2.2, which is similar to blood pool activity. An indistinctly marginated lymph node just below the left renal vein has a maximum SUV of 2.4, just above blood pool levels., and measures about 1.0 cm in diameter. Lymph nodes anterior to the left psoas muscle have maximum SUV of 2.0, similar to blood pool. Overall these lymph nodes are borderline enlarged and with activity level at or just minimally above blood pool. While certainly not grossly positive, their proximity to the pancreatic head mass and borderline imaging characteristics make it difficult to exclude early nodal involvement by malignancy.   03/24/2020 Surgery   Whipple surgery, Laparoscopic Diagnostic, Appendectomy by Dr Barry Dienes    03/24/2020 Pathology Results   FINAL MICROSCOPIC DIAGNOSIS:   A. WHIPPLE PROCEDURE, WITH GALLBLADDER:  - Adenocarcinoma, moderately differentiated, spanning 1 cm.  - Tumor involves retroperitoneal peripancreatic soft tissue.  - Resection margins are negative.  - Treatment effect present.  - Rare tumor cells in one of fourteen lymph nodes (1/14).  - Benign gallbladder with chronic inflammation and benign lymph node.  - Bile duct with granulation tissue and reactive changes.  - Benign appendix.  - Mild reactive gastropathy.  - See oncology table.   B. LYMPH NODE, PORTAL, EXCISION:  - Three of three lymph nodes negative for carcinoma (0/3).   C. LYMPH NODE, COMMON HEPATIC ARTERY, EXCISION:  - Three of three lymph nodes negative for carcinoma (0/3).   D. APPENDIX, APPENDECTOMY:  -  Benign appendix.   E. LYMPH NODE, AORTOCAVAL, EXCISION:  - One of  one lymph node negative for carcinoma (0/1).    03/24/2020 Cancer Staging   Staging form: Exocrine Pancreas, AJCC 8th Edition - Pathologic stage from 03/24/2020: Stage IIB (pT1c, pN1, cM0) - Signed by Truitt Merle, MD on 04/16/2020   04/14/2020 Imaging   CT AP  IMPRESSION: 1. Interval Whipple procedure. Pancreatic stent in place. 2. Ill-defined soft tissue in the right abdominal wall extending from the subcutaneous tissues to the anterior abdominal wall musculature, but no focal fluid collection. This may represent postsurgical change, sterility indeterminate by imaging. 3. Pelvic anatomy is poorly defined, including pelvic bowel loops, adnexa, and colon. There appears to be sigmoid colonic wall thickening as well as intraluminal fluid, with additional diffuse colonic wall thickening from the transverse colon distally. Findings are suggestive of generalized colitis. Attention to this area on follow-up is recommended. 4. Small amount of non organized free fluid in the right upper quadrant. No evidence of intra-abdominal abscess. 5. Periportal edema, nonspecific. Previous biliary stent has been removed. Common bile duct is not well-defined on the current exam. 6. Mild splenomegaly. 7. Trace right pleural effusion.   05/19/2020 -  Chemotherapy   Adjuvant Gemcitabine 2 weeks on 1 week off starting 05/19/20 and Xeloda started on 05/25/2020. Xeloda dose currently at 1576m in the AM and 1001min the PM.        CURRENT THERAPY:  Adjuvant Gemcitabine 2 weeks on 1 week off starting 05/19/20 and Xelodastarted on 05/25/2020. Xeloda dose currently at 150026mn the AM and 1000m79m the PM.   INTERVAL HISTORY:  Breanna [place] for a follow up. She presents to the clinic with her husband. Mild diarrhea last week, with cramps, resolved spontaneously.  No fever or chills. appetei is good, gained 3lbs since last visit. Occasional upgastric pain, usually after meal, resolves on its own. Little  numbness on fingers, no impact on her functions.  All other systems were reviewed with the patient and are negative.  MEDICAL HISTORY:  Past Medical History:  Diagnosis Date  [facility]  . Cataracts, bilateral   . Diverticulosis 12/07/2001   found on colonoscopy  . Family history of colon cancer   . Family history of pancreatic cancer   . Hypothyroid 10/2007  . Macular hole   . Pancreatic mass   . PONV (postoperative nausea and vomiting)     SURGICAL HISTORY: Past Surgical History:  Procedure Laterality Date  . APPENDECTOMY N/A 03/24/2020   Procedure: APPENDECTOMY;  Surgeon: ByerStark Klein;  [facility] [place];  Laterality: N/A;  . BILIARY STENT PLACEMENT  10/10/2019   Procedure: BILIARY STENT PLACEMENT;  Surgeon: OutlArta Silence;  [facility] [place];;  . CATARACT EXTRACTION, BILATERAL    . COLONOSCOPY    . ERCP N/A 10/10/2019   Procedure: ENDOSCOPIC RETROGRADE CHOLANGIOPANCREATOGRAPHY (ERCP);  Surgeon: OutlArta Silence;  [facility];  Service: Endoscopy;  Laterality: N/A;  . ESOPHAGOGASTRODUODENOSCOPY N/A 10/10/2019   Procedure: ESOPHAGOGASTRODUODENOSCOPY (EGD);  Surgeon: OutlArta Silence;  [facility];  Service: Endoscopy;  Laterality: N/A;  . EUS N/A 10/10/2019   Procedure: UPPER ENDOSCOPIC ULTRASOUND (EUS) LINEAR;  Surgeon: OutlArta Silence;  [facility];  Laterality: N/A;  . EYE SURGERY    . FINE NEEDLE ASPIRATION  10/10/2019   Procedure: FINE NEEDLE ASPIRATION (FNA) LINEAR;  Surgeon: OutlArta Silence;  [facility];  Service: Endoscopy;;  . IR IMAGING GUIDED PORT INSERTION  10/25/2019  . LAPAROSCOPY N/A 03/24/2020   Procedure: LAPAROSCOPY DIAGNOSTIC;  Surgeon: Stark Klein, MD;  Location: [employer];  Service: General;  Laterality: N/A;  . Macular hole surg    . PANCREATIC STENT PLACEMENT  10/10/2019   Procedure: PANCREATIC STENT PLACEMENT;   Surgeon: Arta Silence, MD;  Location: [facility] ENDOSCOPY;  Service: Endoscopy;;  . SPHINCTEROTOMY  10/10/2019   Procedure: Joan Mayans;  Surgeon: Arta Silence, MD;  Location[facility] END[facility]COPY;  Service: Endoscopy;;  . TONSILLECTOMY    . TONSILLECTOMY    . WHIPPLE PROCEDURE N/A 03/24/2020   Procedure: WHIPPLE PROCEDURE;  Surgeon: Stark Klein, MD;  Location: [place];  Service: General;  Laterality: N/A;    I have reviewed the social history and family history with the patient and they are unchanged from previous note.  ALLERGIES:  has no allergies on file.  MEDICATIONS:  Current Outpatient Medications  Medication Sig Dispense Refill  . Ascorbic Acid (VITAMIN C PO) Take 1,000 mg by mouth daily.     . B Complex Vitamins (B-COMPLEX/B-12 PO) Take by mouth. (Patient not taking: No sig reported)    . Calcium Carb-Cholecalciferol (CALCIUM 1000 + D PO) Take 1 tablet by mouth daily.     . capecitabine (XELODA) 500 MG tablet Take 3 tablets (1500 mg total) by mouth in morning and 2 tabs (1000 mg total) in evening. Take 14 days on, 7 days off, repeat every 21 days. Taken within 30 minutes of meals. 38 tablet 0  . diphenhydrAMINE (BENADRYL) 25 MG tablet Take 25 mg by mouth at bedtime.    . ferrous sulfate 325 (65 FE) MG tablet Take 325 mg by mouth daily with breakfast.    . ibuprofen (ADVIL) 200 MG tablet Take 400 mg by mouth every 6 (six) hours as needed for moderate pain.    Marland Kitchen levothyroxine (SYNTHROID, LEVOTHROID) 50 MCG tablet Take 1 tablet (50 mcg total) by mouth daily. 90 tablet 4  . lipase/protease/amylase (CREON) 36000 UNITS CPEP capsule Take 1 capsule (36,000 Units total) by mouth 3 (three) times daily with meals. May also take 1 capsule (36,000 Units total) as needed (with snacks). 50 capsule 0  . LORazepam (ATIVAN) 0.5 MG tablet Take 1 tablet (0.5 mg total) by mouth daily as needed for anxiety. For anticipatory n/v prior to chemo treatments (Patient not taking: No sig reported) 15 tablet 0  .  Multiple Vitamin (MULTIVITAMIN PO) Take 1 tablet by mouth daily.    . mupirocin ointment (BACTROBAN) 2 % Place 1 application into the nose daily as needed (sore in nose). (Patient not taking: Reported on 03/17/2020)    . ondansetron (ZOFRAN) 4 MG tablet Take 1 tablet (4 mg total) by mouth every 8 (eight) hours as needed. 30 tablet 2  . oxyCODONE (OXY IR/ROXICODONE) 5 MG immediate release tablet Take 1 tablet (5 mg total) by mouth every 6 (six) hours as needed for moderate pain. 30 tablet 0  . pantoprazole (PROTONIX) 40 MG tablet Take 1 tablet (40 mg total) by mouth daily with supper. 30 tablet 1  . potassium chloride SA (KLOR-CON) 20 MEQ tablet Take 1 tablet (20 mEq total) by mouth daily. 30 tablet 0  . prochlorperazine (COMPAZINE) 10 MG tablet TAKE 1 TABLET BY MOUTH EVERY 6 HOURS AS NEEDED FOR NAUSEA AND VOMITING 30 tablet 2  . vitamin B-12 (CYANOCOBALAMIN) 1000 MCG tablet Take 1,000 mcg by mouth daily.     No  current facility-administered medications for this visit.    PHYSICAL EXAMINATION: ECOG PERFORMANCE STATUS: 1 - Symptomatic but completely ambulatory  Vitals:   07/22/20 0821  BP: 103/60  Pulse: 67  Resp: 18  Temp: (!) 97.3 F (36.3 C)  SpO2: 100%   Filed Weights   07/22/20 0821  Weight: 112 lb 3.2 oz (50.9 kg)    GENERAL:alert, no distress and comfortable SKIN: skin color, texture, turgor are normal, no rashes or significant lesions EYES: normal, Conjunctiva are pink and non-injected, sclera clear NECK: supple, thyroid normal size, non-tender, without nodularity LYMPH:  no palpable lymphadenopathy in the cervical, axillary  LUNGS: clear to auscultation and percussion with normal breathing effort HEART: regular rate & rhythm and no murmurs and no lower extremity edema ABDOMEN:abdomen soft, non-tender and normal bowel sounds Musculoskeletal:no cyanosis of digits and no clubbing  NEURO: alert & oriented x 3 with fluent speech, no focal motor/sensory deficits  LABORATORY  DATA:  I have reviewed the data as listed CBC Latest Ref Rng & Units 07/22/2020 07/07/2020 07/01/2020  WBC 4.0 - 10.5 K/uL 3.3(L) 2.5(L) 4.3  Hemoglobin 12.0 - 15.0 g/dL 10.1(L) 9.7(L) 10.1(L)  Hematocrit 36.0 - 46.0 % 31.3(L) 30.4(L) 32.4(L)  Platelets 150 - 400 K/uL 166 144(L) 203     CMP Latest Ref Rng & Units 07/22/2020 07/07/2020 07/01/2020  Glucose 70 - 99 mg/dL 109(H) 118(H) 98  BUN 6 - 20 mg/dL 5(L) 6 6  Creatinine 0.44 - 1.00 mg/dL 0.62 0.56 0.58  Sodium 135 - 145 mmol/L 141 138 137  Potassium 3.5 - 5.1 mmol/L 3.8 3.6 3.9  Chloride 98 - 111 mmol/L 106 104 104  CO2 22 - 32 mmol/L _0 Calcium 8.9 - 10.3 mg/dL 8.9 8.8(L) 8.8(L)  Total Protein 6.5 - 8.1 g/dL 6.9 6.8 7.1  Total Bilirubin 0.3 - 1.2 mg/dL 0.3 0.3 0.3  Alkaline Phos 38 - 126 U/L 128(H) 134(H) 119  AST 15 - 41 U/L 36 32 28  ALT 0 - 44 U/L _1 RADIOGRAPHIC STUDIES: I have personally reviewed the radiological images as listed and agreed with the findings in the report. No results found.   ASSESSMENT & PLAN:  Breanna Noble is a 60 y.o. female with    1. Pancreatic Cancer, Stage IIB,cT2N1M0, ypT1N1 -She was diagnosed in 10/2019.Her initialUS showed bile duct and gallbladder dilation due to 2-3cm mass at head of pancreas/Uncinate. Her MRI showed her mass is laying close to SMV but not involving it. Her CT CAP from 10/23/19 showedborderline potential adenopathy in the periaortic region which will be followed on restaging scans. There is no definite vascular involvementor distant metastasis. -She underwent EUS and ERCP on 10/10/19 and had 2 stents placed by Dr Paulita Fujita. Herpancreatic mass biopsyshowed Adenocarcinoma of Pancreas.Endoscopic staging T2N1, noevidence of vascular involvement. -She completed 8 cycles of neoadjuvant FOLFIRINOX7/10/21-10/21/21,tolerated moderately well -She underwent Whipple surgery with Dr Barry Dienes on 03/24/20.Pathshoweda residual 1 cm moderately differentiated adenocarcinoma,  and 1 out of 14 positive lymph nodes. Surgical margins were negative. Lymph nodes biopsy from portal, hepatic artery, and aortocaval were all negative. -We discussed her that she had good response to neoadjuvant chemotherapy, but still has significant residual disease, especially positive lymph nodes, which will predict high risk for recurrence after surgery. -I recommended additional chemo with Adjuvant Gemcitabine 2 weeks on 1 week off starting 05/19/20 and Xelodastarted on 05/25/2020. Plan for 4 cycles, then scan her. Xeloda dose currently at 1558m in the  AM and 10102m in the PM.  -She is currently tolerating treatment well.  Lab reviewed, adequate for treatment, will proceed last cycle of capecitabine and gemcitabine today  -We discussed cancer surveillance.  Due to her high risk of recurrence, will repeat CT scan every 4 to 6 months. -Follow-up in 2 months with lab, flush and CT chest, and pelvis for 3 days before  3. Upper Abdominal Pain,Weight loss, Nausea -Presented with initial jaundice but since stent placement her pain hasmostlyresolvedand only occasional. -Nauseafrom chemois controlled on Compazineand Zofran. Her weight is maintained onBoost and premier proteinand adequate eating.If she loses more weight I recommend she increase supplements. -She will continue to f/u with dietician.Stable.  4. Genetic Testingin 10/2019 was negative for pathogenetic mutations.   5.Pancytopenia, secondary to chemo -will monitor closely -Improved after C2 with normal WBC, ANC and PLT. Hg at 10.1 (07/01/20)   PLAN: -Labs reviewed and adequate to proceed with C4D1 Gemcitabine today, gemcitabine next week.  This is last cycle adjuvant chemo. -start Xeloda today at 1509min the AM, 100065mn the PM 2 weeks on/1 week off  -Follow-up in 2 months with lab, flush, CT chest abdomen pelvis with contrast a few days before   No problem-specific Assessment & Plan notes found for this  encounter.   Orders Placed This Encounter  Procedures  . CT CHEST ABDOMEN PELVIS W CONTRAST    Standing Status:   Future    Standing Expiration Date:   07/22/2021    Order Specific Question:   If indicated for the ordered procedure, I authorize the administration of contrast media per Radiology protocol    Answer:   Yes    Order Specific Question:   Is patient pregnant?    Answer:   No    Order Specific Question:   Preferred imaging [facility]?    [facility] Order Specific Question:   Release to patient    Answer:   Immediate    Order Specific Question:   Is Oral Contrast requested for this exam?    Answer:   Yes, Per Radiology protocol    Order Specific Question:   Reason for Exam (SYMPTOM  OR DIAGNOSIS REQUIRED)    Answer:   rule out recurrence   All questions were answered. The patient knows to call the clinic with any problems, questions or concerns. No barriers to learning was detected. The total time spent in the appointment was 30 minutes.     YanTruitt MerleD 07/22/2020   I, AmoJoslyn Devonm acting as scribe for YanTruitt MerleD.   I have reviewed the above documentation for accuracy and completeness, and I agree with the above.

## 2020-07-22 ENCOUNTER — Inpatient Hospital Stay (HOSPITAL_BASED_OUTPATIENT_CLINIC_OR_DEPARTMENT_OTHER): Payer: BC Managed Care – PPO | Admitting: Hematology

## 2020-07-22 ENCOUNTER — Encounter: Payer: Self-pay | Admitting: Hematology

## 2020-07-22 ENCOUNTER — Inpatient Hospital Stay: Payer: BC Managed Care – PPO

## 2020-07-22 ENCOUNTER — Other Ambulatory Visit: Payer: Self-pay

## 2020-07-22 VITALS — BP 103/60 | HR 67 | Temp 97.3°F | Resp 18 | Ht 65.0 in | Wt 112.2 lb

## 2020-07-22 DIAGNOSIS — C250 Malignant neoplasm of head of pancreas: Secondary | ICD-10-CM

## 2020-07-22 DIAGNOSIS — Z95828 Presence of other vascular implants and grafts: Secondary | ICD-10-CM

## 2020-07-22 DIAGNOSIS — Z5111 Encounter for antineoplastic chemotherapy: Secondary | ICD-10-CM | POA: Diagnosis not present

## 2020-07-22 DIAGNOSIS — D61810 Antineoplastic chemotherapy induced pancytopenia: Secondary | ICD-10-CM | POA: Diagnosis not present

## 2020-07-22 DIAGNOSIS — C257 Malignant neoplasm of other parts of pancreas: Secondary | ICD-10-CM | POA: Diagnosis not present

## 2020-07-22 LAB — CBC WITH DIFFERENTIAL (CANCER CENTER ONLY)
Abs Immature Granulocytes: 0 10*3/uL (ref 0.00–0.07)
Basophils Absolute: 0 10*3/uL (ref 0.0–0.1)
Basophils Relative: 1 %
Eosinophils Absolute: 0.1 10*3/uL (ref 0.0–0.5)
Eosinophils Relative: 2 %
HCT: 31.3 % — ABNORMAL LOW (ref 36.0–46.0)
Hemoglobin: 10.1 g/dL — ABNORMAL LOW (ref 12.0–15.0)
Immature Granulocytes: 0 %
Lymphocytes Relative: 32 %
Lymphs Abs: 1.1 10*3/uL (ref 0.7–4.0)
MCH: 30.1 pg (ref 26.0–34.0)
MCHC: 32.3 g/dL (ref 30.0–36.0)
MCV: 93.4 fL (ref 80.0–100.0)
Monocytes Absolute: 0.7 10*3/uL (ref 0.1–1.0)
Monocytes Relative: 21 %
Neutro Abs: 1.5 10*3/uL — ABNORMAL LOW (ref 1.7–7.7)
Neutrophils Relative %: 44 %
Platelet Count: 166 10*3/uL (ref 150–400)
RBC: 3.35 MIL/uL — ABNORMAL LOW (ref 3.87–5.11)
RDW: 22.2 % — ABNORMAL HIGH (ref 11.5–15.5)
WBC Count: 3.3 10*3/uL — ABNORMAL LOW (ref 4.0–10.5)
nRBC: 0 % (ref 0.0–0.2)

## 2020-07-22 LAB — CMP (CANCER CENTER ONLY)
ALT: 26 U/L (ref 0–44)
AST: 36 U/L (ref 15–41)
Albumin: 3.7 g/dL (ref 3.5–5.0)
Alkaline Phosphatase: 128 U/L — ABNORMAL HIGH (ref 38–126)
Anion gap: 9 (ref 5–15)
BUN: 5 mg/dL — ABNORMAL LOW (ref 6–20)
CO2: 26 mmol/L (ref 22–32)
Calcium: 8.9 mg/dL (ref 8.9–10.3)
Chloride: 106 mmol/L (ref 98–111)
Creatinine: 0.62 mg/dL (ref 0.44–1.00)
GFR, Estimated: >60 mL/min (ref >60)
Glucose, Bld: 109 mg/dL — ABNORMAL HIGH (ref 70–99)
Potassium: 3.8 mmol/L (ref 3.5–5.1)
Sodium: 141 mmol/L (ref 135–145)
Total Bilirubin: 0.3 mg/dL (ref 0.3–1.2)
Total Protein: 6.9 g/dL (ref 6.5–8.1)

## 2020-07-22 MED ORDER — SODIUM CHLORIDE 0.9 % IV SOLN
1000.0000 mg/m2 | Freq: Once | INTRAVENOUS | Status: AC
  Administered 2020-07-22: 1520 mg via INTRAVENOUS
  Filled 2020-07-22: qty 39.98

## 2020-07-22 MED ORDER — PROCHLORPERAZINE MALEATE 10 MG PO TABS
10.0000 mg | ORAL_TABLET | Freq: Once | ORAL | Status: AC
  Administered 2020-07-22: 10 mg via ORAL

## 2020-07-22 MED ORDER — SODIUM CHLORIDE 0.9% FLUSH
10.0000 mL | INTRAVENOUS | Status: DC | PRN
  Administered 2020-07-22: 10 mL
  Filled 2020-07-22: qty 10

## 2020-07-22 MED ORDER — SODIUM CHLORIDE 0.9% FLUSH
10.0000 mL | Freq: Once | INTRAVENOUS | Status: AC
  Administered 2020-07-22: 10 mL
  Filled 2020-07-22: qty 10

## 2020-07-22 MED ORDER — PROCHLORPERAZINE MALEATE 10 MG PO TABS
ORAL_TABLET | ORAL | Status: AC
  Filled 2020-07-22: qty 1

## 2020-07-22 MED ORDER — HEPARIN SOD (PORK) LOCK FLUSH 100 UNIT/ML IV SOLN
500.0000 [IU] | Freq: Once | INTRAVENOUS | Status: AC | PRN
  Administered 2020-07-22: 500 [IU]
  Filled 2020-07-22: qty 5

## 2020-07-22 MED ORDER — SODIUM CHLORIDE 0.9 % IV SOLN
Freq: Once | INTRAVENOUS | Status: AC
  Filled 2020-07-22: qty 250

## 2020-07-22 NOTE — Patient Instructions (Signed)
[facility] °Discharge Instructions for Patients Receiving Chemotherapy ° °Today you received the following chemotherapy agents Gemzar ° °To help prevent nausea and vomiting after your treatment, we encourage you to take your nausea medication as directed. °  °If you develop nausea and vomiting that is not controlled by your nausea medication, call the clinic.  ° °BELOW ARE SYMPTOMS THAT SHOULD BE REPORTED IMMEDIATELY: °· *FEVER GREATER THAN 100.5 F °· *CHILLS WITH OR WITHOUT FEVER °· NAUSEA AND VOMITING THAT IS NOT CONTROLLED WITH YOUR NAUSEA MEDICATION °· *UNUSUAL SHORTNESS OF BREATH °· *UNUSUAL BRUISING OR BLEEDING °· TENDERNESS IN MOUTH AND THROAT WITH OR WITHOUT PRESENCE OF ULCERS °· *URINARY PROBLEMS °· *BOWEL PROBLEMS °· UNUSUAL RASH °Items with * indicate a potential emergency and should be followed up as soon as possible. ° °Feel free to call the clinic should you have any questions or concerns. The clinic phone number is [phone]. ° °Please show the CHEMO ALERT CARD at check-in to the Emergency Department and triage nurse. ° ° °

## 2020-07-28 ENCOUNTER — Inpatient Hospital Stay: Payer: BC Managed Care – PPO

## 2020-07-28 ENCOUNTER — Inpatient Hospital Stay: Payer: BC Managed Care – PPO | Admitting: Nutrition

## 2020-07-28 ENCOUNTER — Other Ambulatory Visit: Payer: Self-pay

## 2020-07-28 VITALS — BP 98/76 | HR 66 | Temp 98.2°F | Resp 18

## 2020-07-28 DIAGNOSIS — Z95828 Presence of other vascular implants and grafts: Secondary | ICD-10-CM

## 2020-07-28 DIAGNOSIS — C250 Malignant neoplasm of head of pancreas: Secondary | ICD-10-CM

## 2020-07-28 DIAGNOSIS — Z5111 Encounter for antineoplastic chemotherapy: Secondary | ICD-10-CM | POA: Diagnosis not present

## 2020-07-28 DIAGNOSIS — C257 Malignant neoplasm of other parts of pancreas: Secondary | ICD-10-CM | POA: Diagnosis not present

## 2020-07-28 DIAGNOSIS — D61810 Antineoplastic chemotherapy induced pancytopenia: Secondary | ICD-10-CM | POA: Diagnosis not present

## 2020-07-28 LAB — CMP (CANCER CENTER ONLY)
ALT: 29 U/L (ref 0–44)
AST: 32 U/L (ref 15–41)
Albumin: 3.7 g/dL (ref 3.5–5.0)
Alkaline Phosphatase: 131 U/L — ABNORMAL HIGH (ref 38–126)
Anion gap: 10 (ref 5–15)
BUN: 7 mg/dL (ref 6–20)
CO2: 26 mmol/L (ref 22–32)
Calcium: 8.7 mg/dL — ABNORMAL LOW (ref 8.9–10.3)
Chloride: 103 mmol/L (ref 98–111)
Creatinine: 0.58 mg/dL (ref 0.44–1.00)
GFR, Estimated: >60 mL/min (ref >60)
Glucose, Bld: 96 mg/dL (ref 70–99)
Potassium: 3.8 mmol/L (ref 3.5–5.1)
Sodium: 139 mmol/L (ref 135–145)
Total Bilirubin: 0.4 mg/dL (ref 0.3–1.2)
Total Protein: 6.9 g/dL (ref 6.5–8.1)

## 2020-07-28 LAB — CBC WITH DIFFERENTIAL (CANCER CENTER ONLY)
Abs Immature Granulocytes: 0 10*3/uL (ref 0.00–0.07)
Basophils Absolute: 0 10*3/uL (ref 0.0–0.1)
Basophils Relative: 1 %
Eosinophils Absolute: 0 10*3/uL (ref 0.0–0.5)
Eosinophils Relative: 1 %
HCT: 30.6 % — ABNORMAL LOW (ref 36.0–46.0)
Hemoglobin: 9.9 g/dL — ABNORMAL LOW (ref 12.0–15.0)
Immature Granulocytes: 0 %
Lymphocytes Relative: 43 %
Lymphs Abs: 1.2 10*3/uL (ref 0.7–4.0)
MCH: 30.6 pg (ref 26.0–34.0)
MCHC: 32.4 g/dL (ref 30.0–36.0)
MCV: 94.4 fL (ref 80.0–100.0)
Monocytes Absolute: 0.4 10*3/uL (ref 0.1–1.0)
Monocytes Relative: 14 %
Neutro Abs: 1.1 10*3/uL — ABNORMAL LOW (ref 1.7–7.7)
Neutrophils Relative %: 41 %
Platelet Count: 165 10*3/uL (ref 150–400)
RBC: 3.24 MIL/uL — ABNORMAL LOW (ref 3.87–5.11)
RDW: 20.9 % — ABNORMAL HIGH (ref 11.5–15.5)
WBC Count: 2.6 10*3/uL — ABNORMAL LOW (ref 4.0–10.5)
nRBC: 0 % (ref 0.0–0.2)

## 2020-07-28 MED ORDER — SODIUM CHLORIDE 0.9 % IV SOLN
1000.0000 mg/m2 | Freq: Once | INTRAVENOUS | Status: AC
  Administered 2020-07-28: 1520 mg via INTRAVENOUS
  Filled 2020-07-28: qty 39.98

## 2020-07-28 MED ORDER — SODIUM CHLORIDE 0.9% FLUSH
10.0000 mL | INTRAVENOUS | Status: DC | PRN
  Administered 2020-07-28: 10 mL
  Filled 2020-07-28: qty 10

## 2020-07-28 MED ORDER — HEPARIN SOD (PORK) LOCK FLUSH 100 UNIT/ML IV SOLN
500.0000 [IU] | Freq: Once | INTRAVENOUS | Status: AC | PRN
  Administered 2020-07-28: 500 [IU]
  Filled 2020-07-28: qty 5

## 2020-07-28 MED ORDER — PROCHLORPERAZINE MALEATE 10 MG PO TABS
10.0000 mg | ORAL_TABLET | Freq: Once | ORAL | Status: AC
  Administered 2020-07-28: 10 mg via ORAL

## 2020-07-28 MED ORDER — SODIUM CHLORIDE 0.9% FLUSH
10.0000 mL | Freq: Once | INTRAVENOUS | Status: AC
  Administered 2020-07-28: 10 mL
  Filled 2020-07-28: qty 10

## 2020-07-28 MED ORDER — SODIUM CHLORIDE 0.9 % IV SOLN
Freq: Once | INTRAVENOUS | Status: AC
  Filled 2020-07-28: qty 250

## 2020-07-28 MED ORDER — PROCHLORPERAZINE MALEATE 10 MG PO TABS
ORAL_TABLET | ORAL | Status: AC
  Filled 2020-07-28: qty 1

## 2020-07-28 NOTE — Progress Notes (Signed)
Per Dr. Burr Medico OK to treat with ANC of 1.1

## 2020-07-28 NOTE — Patient Instructions (Signed)
Implanted Port Insertion, Care After This sheet gives you information about how to care for yourself after your procedure. Your health care provider may also give you more specific instructions. If you have problems or questions, contact your health care provider. What can I expect after the procedure? After the procedure, it is common to have:  Discomfort at the port insertion site.  Bruising on the skin over the port. This should improve over 3-4 days. Follow these instructions at home: Port care  After your port is placed, you will get a manufacturer's information card. The card has information about your port. Keep this card with you at all times.  Take care of the port as told by your health care provider. Ask your health care provider if you or a family member can get training for taking care of the port at home. A home health care nurse may also take care of the port.  Make sure to remember what type of port you have. Incision care  Follow instructions from your health care provider about how to take care of your port insertion site. Make sure you: ? Wash your hands with soap and water before and after you change your bandage (dressing). If soap and water are not available, use hand sanitizer. ? Change your dressing as told by your health care provider. ? Leave stitches (sutures), skin glue, or adhesive strips in place. These skin closures may need to stay in place for 2 weeks or longer. If adhesive strip edges start to loosen and curl up, you may trim the loose edges. Do not remove adhesive strips completely unless your health care provider tells you to do that.  Check your port insertion site every day for signs of infection. Check for: ? Redness, swelling, or pain. ? Fluid or blood. ? Warmth. ? Pus or a bad smell.      Activity  Return to your normal activities as told by your health care provider. Ask your health care provider what activities are safe for you.  Do not  lift anything that is heavier than 10 lb (4.5 kg), or the limit that you are told, until your health care provider says that it is safe. General instructions  Take over-the-counter and prescription medicines only as told by your health care provider.  Do not take baths, swim, or use a hot tub until your health care provider approves. Ask your health care provider if you may take showers. You may only be allowed to take sponge baths.  Do not drive for 24 hours if you were given a sedative during your procedure.  Wear a medical alert bracelet in case of an emergency. This will tell any health care providers that you have a port.  Keep all follow-up visits as told by your health care provider. This is important. Contact a health care provider if:  You cannot flush your port with saline as directed, or you cannot draw blood from the port.  You have a fever or chills.  You have redness, swelling, or pain around your port insertion site.  You have fluid or blood coming from your port insertion site.  Your port insertion site feels warm to the touch.  You have pus or a bad smell coming from the port insertion site. Get help right away if:  You have chest pain or shortness of breath.  You have bleeding from your port that you cannot control. Summary  Take care of the port as told by your   health care provider. Keep the manufacturer's information card with you at all times.  Change your dressing as told by your health care provider.  Contact a health care provider if you have a fever or chills or if you have redness, swelling, or pain around your port insertion site.  Keep all follow-up visits as told by your health care provider. This information is not intended to replace advice given to you by your health care provider. Make sure you discuss any questions you have with your health care provider. Document Revised: 11/14/2017 Document Reviewed: 11/14/2017 Elsevier Patient Education   2021 [employer]  

## 2020-07-28 NOTE — Patient Instructions (Signed)
[facility] °Discharge Instructions for Patients Receiving Chemotherapy ° °Today you received the following chemotherapy agents Gemzar ° °To help prevent nausea and vomiting after your treatment, we encourage you to take your nausea medication as directed. °  °If you develop nausea and vomiting that is not controlled by your nausea medication, call the clinic.  ° °BELOW ARE SYMPTOMS THAT SHOULD BE REPORTED IMMEDIATELY: °· *FEVER GREATER THAN 100.5 F °· *CHILLS WITH OR WITHOUT FEVER °· NAUSEA AND VOMITING THAT IS NOT CONTROLLED WITH YOUR NAUSEA MEDICATION °· *UNUSUAL SHORTNESS OF BREATH °· *UNUSUAL BRUISING OR BLEEDING °· TENDERNESS IN MOUTH AND THROAT WITH OR WITHOUT PRESENCE OF ULCERS °· *URINARY PROBLEMS °· *BOWEL PROBLEMS °· UNUSUAL RASH °Items with * indicate a potential emergency and should be followed up as soon as possible. ° °Feel free to call the clinic should you have any questions or concerns. The clinic phone number is [phone]. ° °Please show the CHEMO ALERT CARD at check-in to the Emergency Department and triage nurse. ° ° °

## 2020-07-28 NOTE — Progress Notes (Signed)
Nutrition follow-up completed with patient during infusion for pancreas cancer. Patient status post Whipple procedure on November 23. Weight improved and documented as of 112.2 pounds, increased from 109.1 pounds March 2. Patient reports a little nausea and diarrhea however it was not really an issue. She has tolerated juice-based nutrition drinks and was even able to tolerate a regular boost. She is pleased with her success and appreciative for nutrition information.  Nutrition diagnosis: Severe malnutrition improved.  Intervention: Support and encourage patient to continue strategies for increased calorie and protein intake. Manage side effects as needed with medications or food strategies. Continue supplements to promote weight gain.  Monitoring, evaluation, goals: Patient will tolerate adequate calories and protein for weight maintenance/weight gain.  Next visit: To be scheduled as needed.  **Disclaimer: This note was dictated with voice recognition software. Similar sounding words can inadvertently be transcribed and this note may contain transcription errors which may not have been corrected upon publication of note.**

## 2020-07-29 ENCOUNTER — Other Ambulatory Visit: Payer: Self-pay | Admitting: Hematology

## 2020-07-29 DIAGNOSIS — C250 Malignant neoplasm of head of pancreas: Secondary | ICD-10-CM

## 2020-07-29 LAB — CANCER ANTIGEN 19-9: CA 19-9: 32 U/mL (ref 0–35)

## 2020-08-18 ENCOUNTER — Inpatient Hospital Stay: Payer: BC Managed Care – PPO

## 2020-08-18 ENCOUNTER — Other Ambulatory Visit: Payer: Self-pay

## 2020-08-18 ENCOUNTER — Encounter (HOSPITAL_COMMUNITY): Payer: Self-pay

## 2020-08-18 ENCOUNTER — Ambulatory Visit (HOSPITAL_COMMUNITY)
Admission: RE | Admit: 2020-08-18 | Discharge: 2020-08-18 | Disposition: A | Discharge status: Home / Self Care | Payer: BC Managed Care – PPO | Source: Ambulatory Visit | Attending: Hematology | Admitting: Hematology

## 2020-08-18 ENCOUNTER — Inpatient Hospital Stay: Payer: BC Managed Care – PPO | Attending: Hematology

## 2020-08-18 DIAGNOSIS — Z08 Encounter for follow-up examination after completed treatment for malignant neoplasm: Secondary | ICD-10-CM | POA: Diagnosis not present

## 2020-08-18 DIAGNOSIS — I2510 Atherosclerotic heart disease of native coronary artery without angina pectoris: Secondary | ICD-10-CM | POA: Diagnosis not present

## 2020-08-18 DIAGNOSIS — D61810 Antineoplastic chemotherapy induced pancytopenia: Secondary | ICD-10-CM | POA: Diagnosis not present

## 2020-08-18 DIAGNOSIS — Z8507 Personal history of malignant neoplasm of pancreas: Secondary | ICD-10-CM | POA: Diagnosis not present

## 2020-08-18 DIAGNOSIS — R161 Splenomegaly, not elsewhere classified: Secondary | ICD-10-CM | POA: Diagnosis not present

## 2020-08-18 DIAGNOSIS — C250 Malignant neoplasm of head of pancreas: Secondary | ICD-10-CM

## 2020-08-18 DIAGNOSIS — M47816 Spondylosis without myelopathy or radiculopathy, lumbar region: Secondary | ICD-10-CM | POA: Diagnosis not present

## 2020-08-18 DIAGNOSIS — J984 Other disorders of lung: Secondary | ICD-10-CM | POA: Diagnosis not present

## 2020-08-18 LAB — CBC WITH DIFFERENTIAL (CANCER CENTER ONLY)
Abs Immature Granulocytes: 0.01 10*3/uL (ref 0.00–0.07)
Basophils Absolute: 0 10*3/uL (ref 0.0–0.1)
Basophils Relative: 1 %
Eosinophils Absolute: 0.1 10*3/uL (ref 0.0–0.5)
Eosinophils Relative: 1 %
HCT: 35.5 % — ABNORMAL LOW (ref 36.0–46.0)
Hemoglobin: 11.4 g/dL — ABNORMAL LOW (ref 12.0–15.0)
Immature Granulocytes: 0 %
Lymphocytes Relative: 29 %
Lymphs Abs: 1.3 10*3/uL (ref 0.7–4.0)
MCH: 31.7 pg (ref 26.0–34.0)
MCHC: 32.1 g/dL (ref 30.0–36.0)
MCV: 98.6 fL (ref 80.0–100.0)
Monocytes Absolute: 0.7 10*3/uL (ref 0.1–1.0)
Monocytes Relative: 15 %
Neutro Abs: 2.4 10*3/uL (ref 1.7–7.7)
Neutrophils Relative %: 54 %
Platelet Count: 177 10*3/uL (ref 150–400)
RBC: 3.6 MIL/uL — ABNORMAL LOW (ref 3.87–5.11)
RDW: 20 % — ABNORMAL HIGH (ref 11.5–15.5)
WBC Count: 4.4 10*3/uL (ref 4.0–10.5)
nRBC: 0 % (ref 0.0–0.2)

## 2020-08-18 LAB — CMP (CANCER CENTER ONLY)
ALT: 29 U/L (ref 0–44)
AST: 40 U/L (ref 15–41)
Albumin: 4 g/dL (ref 3.5–5.0)
Alkaline Phosphatase: 129 U/L — ABNORMAL HIGH (ref 38–126)
Anion gap: 8 (ref 5–15)
BUN: 5 mg/dL — ABNORMAL LOW (ref 6–20)
CO2: 27 mmol/L (ref 22–32)
Calcium: 9 mg/dL (ref 8.9–10.3)
Chloride: 103 mmol/L (ref 98–111)
Creatinine: 0.59 mg/dL (ref 0.44–1.00)
GFR, Estimated: >60 mL/min (ref >60)
Glucose, Bld: 102 mg/dL — ABNORMAL HIGH (ref 70–99)
Potassium: 3.9 mmol/L (ref 3.5–5.1)
Sodium: 138 mmol/L (ref 135–145)
Total Bilirubin: 0.5 mg/dL (ref 0.3–1.2)
Total Protein: 7.1 g/dL (ref 6.5–8.1)

## 2020-08-18 MED ORDER — HEPARIN SOD (PORK) LOCK FLUSH 100 UNIT/ML IV SOLN
500.0000 [IU] | Freq: Once | INTRAVENOUS | Status: AC
  Administered 2020-08-18: 500 [IU] via INTRAVENOUS

## 2020-08-18 MED ORDER — IOHEXOL 300 MG/ML  SOLN
100.0000 mL | Freq: Once | INTRAMUSCULAR | Status: AC | PRN
  Administered 2020-08-18: 100 mL via INTRAVENOUS

## 2020-08-18 MED ORDER — HEPARIN SOD (PORK) LOCK FLUSH 100 UNIT/ML IV SOLN
INTRAVENOUS | Status: AC
  Filled 2020-08-18: qty 5

## 2020-08-18 NOTE — Patient Instructions (Signed)
Central Line, Adult A central line is a long, thin tube (catheter) that is put into a vein so that it goes to a large vein above your heart. It can be used to:  Give you medicine or fluids.  Give you food and nutrients.  Take blood or give you blood for testing or treatments. Types of central lines There are four main types of central lines:  Peripherally inserted central catheter (PICC) line. This type is usually put in the upper arm and goes up the arm to the heart.  Tunneled central line. This type is placed in a large vein in the neck, chest, or groin. It is tunneled under the skin and brought out through a second incision.  Non-tunneled central line. This type is used for a shorter time than other types, usually for 7 days at the most. It is inserted in the neck, chest, or groin.  Implanted port. This type can stay in place longer than other types of central lines. It is normally put in the upper chest but can also be placed in the upper arm or the belly. Surgery is needed to put it in and take it out. The type of central line you get will depend on how long you need it and your medical condition.   Tell a doctor about:  Any allergies you have.  All medicines you are taking. These include vitamins, herbs, eye drops, creams, and over-the-counter medicines.  Any problems you or family members have had with anesthetic medicines.  Any blood disorders you have.  Any surgeries you have had.  Any medical conditions you have.  Whether you are pregnant or may be pregnant. What are the risks? Generally, central lines are safe. However, problems may occur, including:  Infection.  A blood clot.  Bleeding from the place where the central line was inserted.  Getting a hole or crack in the central line. If this happens, the central line will need to be replaced.  Central line failure.  The catheter moving or coming out of place. What happens before the  procedure? Medicines  Ask your doctor about changing or stopping: ? Your normal medicines. ? Vitamins, herbs, and supplements. ? Over-the-counter medicines.  Do not take aspirin or ibuprofen unless you are told to. General instructions  Follow instructions from your doctor about eating or drinking.  For your safety, your doctor may: ? Mark the area of the procedure. ? Remove hair at the procedure site. ? Ask you to wash with a soap that kills germs.  Plan to have a responsible adult take you home from the hospital or clinic.  If you will be going home right after the procedure, plan to have a responsible adult care for you for the time you are told. This is important. What happens during the procedure?  An IV tube will be put into one of your veins.  You may be given: ? A sedative. This medicine helps you relax. ? Anesthetics. These medicines numb certain areas of your body.  Your skin will be cleaned with a germ-killing (antiseptic) solution. You may be covered with clean drapes.  Your blood pressure, heart rate, breathing rate, and blood oxygen level will be monitored during the procedure.  The central line will be put into the vein and moved through it to the correct spot. The doctor may use X-ray equipment to help guide the central line to the right place.  A bandage (dressing) will be placed over the insertion area.   The procedure may vary among doctors and hospitals. What can I expect after the procedure?  You will be monitored until you leave the hospital or clinic. This includes checking your blood pressure, heart rate, breathing rate, and blood oxygen level.  Caps may be placed on the ends of the central line tubing.  If you were given a sedative during your procedure, do not drive or use machines until your doctor says that it is safe. Follow these instructions at home: Caring for the tube  Follow instructions from your doctor about: ? Flushing the  tube. ? Cleaning the tube and the area around it.  Only use germ-free (sterile) supplies to flush. The supplies should be from your doctor, a pharmacy, or another place that your doctor recommends.  Before you flush the tube or clean the area around the tube: ? Wash your hands with soap and water for at least 20 seconds. If you cannot use soap and water, use hand sanitizer. ? Clean the central line hub with rubbing alcohol. To do this:  Scrub it using a twisting motion and rub for 10 to 15 seconds or for 30 twists. Follow the manufacturer's instructions.  Be sure you scrub the top of the hub, not just the sides. Never reuse alcohol pads.  Let the hub dry before use. Keep it from touching anything while drying.   Caring for your skin  Check the skin around the central line every day for signs of infection. Check for: ? Redness, swelling, or pain. ? Fluid or blood. ? Warmth. ? Pus or a bad smell.  Keep the area where the tube was put in clean and dry.  Change bandages only as told by your doctor.  Keep your bandage dry. If a bandage gets wet, have it changed right away. General instructions  Keep the tube clamped, unless it is being used.  If you or someone else accidentally pulls on the tube, make sure: ? The bandage is okay. ? There is no bleeding. ? The tube has not been pulled out.  Do not use scissors or sharp objects near the tube.  Do not take baths, swim, or use a hot tub until your doctor says it is okay. Ask your doctor if you may take showers. You may only be allowed to take sponge baths.  Ask your doctor what activities are safe for you. Your doctor may tell you not to lift anything or move your arm too much.  Take over-the-counter and prescription medicines only as told by your doctor.  Keep all follow-up visits. Storing and throwing away supplies  Keep your supplies in a clean, dry location.  Throw away any used syringes in a container that is only for  sharp items (sharps container). You can buy a sharps container from a pharmacy, or you can make one by using an empty hard plastic bottle with a cover.  Place any used bandages or infusion bags into a plastic bag. Throw that bag in the trash. Contact a doctor if:  You have any of these signs of infection where the tube was put in: ? Redness, swelling, or pain. ? Fluid or blood. ? Warmth. ? Pus or a bad smell. Get help right away if:  You have: ? A fever or chills. ? Shortness of breath. ? Pain in your chest. ? A fast heartbeat. ? Swelling in your neck, face, chest, or arm.  You feel dizzy or you faint.  There are red lines coming from   where the tube was put in.  The area where the tube was put in is bleeding and the bleeding will not stop.  Your tube is hard to flush.  You do not get a blood return from the tube.  The tube gets loose or comes out.  The tube has a hole or a tear.  The tube leaks. Summary  A central line is a long, thin tube (catheter) that is put in your vein. It can be used to give you medicine, food, or fluids.  Follow instructions from your doctor about flushing and cleaning the tube.  Keep the area where the tube was put in clean and dry.  Ask your doctor what activities are safe for you. This information is not intended to replace advice given to you by your health care provider. Make sure you discuss any questions you have with your health care provider. Document Revised: 12/19/2019 Document Reviewed: 12/19/2019 [employer] Patient Education  2021 [employer]  

## 2020-08-19 NOTE — Progress Notes (Signed)
[place]   [phone] [phone]   Clinic Follow up Note   Patient Care Team: Vernie Shanks, MD as PCP - General (Family Medicine) Fontaine, Belinda Block, MD (Inactive) as Consulting Physician (Gynecology) Truitt Merle, MD as Consulting Physician (Oncology) Jonnie Finner, RN as Registered Nurse Arta Silence, MD as Consulting Physician (Gastroenterology) Stark Klein, MD as Consulting Physician (General Surgery) Karie Mainland, RD as Dietitian (Nutrition)  Date of Service:  08/21/2020  CHIEF COMPLAINT: F/u of Pancreatic Cancer  SUMMARY OF ONCOLOGIC HISTORY: Oncology History Overview Note  Cancer Staging Pancreatic cancer [facility]) Staging form: Exocrine Pancreas, AJCC 8th Edition - Clinical stage from 10/16/2019: Stage IIB (cT2, cN1, cM0) - Signed by Truitt Merle, MD on 10/16/2019    Pancreatic cancer ([place])  10/03/2019 Imaging   US Abdomen 10/03/19  IMPRESSION: Markedly dilated bile ducts. Dilated gallbladder with sludge but no gallstones.   2.9 cm mass in the uncinate process most likely pancreatic neoplasm causing biliary obstruction. Recommend MRI of the pancreas without with contrast for further evaluation. If the patient cannot have MRI, CT of the pancreas with contrast recommended.   10/07/2019 Imaging   MRI Abdomen 10/07/19  IMPRESSION: 1. There is a hypoenhancing mass of the pancreatic uncinate with abrupt obstruction of the central common bile duct, measuring approximately 2.9 cm, consistent with pancreatic adenocarcinoma. 2. Gross intra and extrahepatic biliary ductal dilatation, common bile duct measuring 1.4 cm. Gallbladder hydrops. 3. The pancreatic duct is nondilated. 4. Pancreatic mass lies closely adjacent to the most central portions of the superior mesenteric vein and central portal vein, due to motion artifact it is difficult to clearly discern whether there is a preserved fat plane. Multiphasic contrast  enhanced pancreatic protocol CT may be less motion sensitive and better delineate vascular structures for the purposes of surgical planning if necessary. 5. No evidence of lymphadenopathy or metastatic disease in the abdomen.   10/10/2019 Procedure   EUS by Dr Paulita Fujita 10/10/19  IMPRESSION - A mass was identified at junction of head/uncinate process of the pancreas. This was staged T2 N1 Mx by endosonographic criteria. Fine needle aspiration performed. - A few abnormal lymph nodes were visualized in the peripancreatic region. - There was dilation in the common bile duct which measured up to 14 mm. - Hyperechoic material consistent with sludge was visualized endosonographically in the gallbladder.   10/10/2019 Procedure   ERCP by Dr Paulita Fujita 10/10/19  IMPRESSION - The major papilla appeared normal. - One temporary stent was placed into the ventral pancreatic duct. - A biliary sphincterotomy was performed. - One covered metal stent was placed into the common bile duct.   10/10/2019 Initial Biopsy   FINAL MICROSCOPIC DIAGNOSIS: 10/10/19  A. PANCREAS, HEAD, FINE NEEDLE ASPIRATION:  - Malignant cells present, consistent with adenocarcinoma    10/16/2019 Initial Diagnosis   Pancreatic cancer ([place])   10/16/2019 Cancer Staging   Staging form: Exocrine Pancreas, AJCC 8th Edition - Clinical stage from 10/16/2019: Stage IIB (cT2, cN1, cM0) - Signed by Truitt Merle, MD on 10/16/2019   10/23/2019 Imaging   CT CAP W contrast  IMPRESSION: 1. Pancreatic mass with local adenopathy and potential retroperitoneal adenopathy. Given retroperitoneal lymph nodes and presence of venous collaterals PET scan may be useful for staging purposes. 2. No definite vascular involvement or signs of upper abdominal venous collaterals. Some stranding around the celiac may be due to recent biopsy. 3. Borderline enlarged RIGHT external iliac lymph node. Attention on follow-up. 4. No evidence  of metastatic disease in the  chest. 5. Signs of recent ERCP and stent placement.   10/25/2019 Procedure   PAC placed    11/09/2019 - 02/20/2020 Chemotherapy   Neoadjuvant FOLFIRINOX q2weeks starting 11/09/19-02/20/20 for 8 cycles   11/28/2019 Genetic Testing   Negative genetic testing on the common hereditary cancer panel, pancreatic cancer panel, preliminary evidence pancreatic cancer panel and chronic pancreatitis panel.  The Common Hereditary Gene Panel offered by [facility] includes sequencing and/or deletion duplication testing of the following 55 genes: APC*, ATM*, AXIN2, BARD1, BMPR1A, BRCA1, BRCA2, BRIP1, CASR, CDH1, CDK4, CDKN2A (p14ARF), CDKN2A (p16INK4a), CFTR*, CHEK2, CPA1, CTNNA1, CTRC, DICER1*, EPCAM*, FANCC, GREM1*, HOXB13, KIT, MEN1*, MLH1*, MSH2*, MSH3*, MSH6*, MUTYH, NBN, NF1*, NTHL1, PALB2, PALLD, PDGFRA, PMS2*, POLD1*, POLE, PRSS1*, PTEN*, RAD50, RAD51C, RAD51D, SDHA*, SDHB, SDHC*, SDHD, SMAD4, SMARCA4, SPINK1, STK11, TP53, TSC1*, TSC2, and VHL .  The report date is November 28, 2019.   01/28/2020 Imaging   CT AP w contrast  IMPRESSION: 1. No substantial interval change in exam. 2. Similar appearance of the pancreatic mass with associated main pancreatic ductal dilatation. 3. Soft tissue stranding again noted around the celiac axis, in the para-aortic retroperitoneal space and along the IMA (new in the interval). These findings are associated with similar appearance of upper normal to borderline hepatoduodenal ligament and retroperitoneal lymphadenopathy. Close continued attention on follow-up recommended. 4. Borderline enlarged right external iliac node identified on previous study has decreased in size in the interval.   02/14/2020 PET scan   IMPRESSION: 1. The pancreatic mass has a maximum SUV of 6.1. No findings of hypermetabolic local adenopathy or distant metastatic spread. 2. Splenomegaly. 3. Inspissated barium or appendicolith within the appendix. No signs of appendiceal inflammation. 4.  Scattered sigmoid colon diverticula. ADDENDUM: The original report was by Dr. Van Clines. The following addendum is by Dr. Van Clines:   On the CT from 01/28/2020 there were some upper normal sized/borderline retroperitoneal lymph nodes including a 0.8 cm aortocaval node on image 72 of series 9. This currently has a maximum SUV of 2.2, which is similar to blood pool activity. An indistinctly marginated lymph node just below the left renal vein has a maximum SUV of 2.4, just above blood pool levels., and measures about 1.0 cm in diameter. Lymph nodes anterior to the left psoas muscle have maximum SUV of 2.0, similar to blood pool. Overall these lymph nodes are borderline enlarged and with activity level at or just minimally above blood pool. While certainly not grossly positive, their proximity to the pancreatic head mass and borderline imaging characteristics make it difficult to exclude early nodal involvement by malignancy.   03/24/2020 Surgery   Whipple surgery, Laparoscopic Diagnostic, Appendectomy by Dr Barry Dienes    03/24/2020 Pathology Results   FINAL MICROSCOPIC DIAGNOSIS:   A. WHIPPLE PROCEDURE, WITH GALLBLADDER:  - Adenocarcinoma, moderately differentiated, spanning 1 cm.  - Tumor involves retroperitoneal peripancreatic soft tissue.  - Resection margins are negative.  - Treatment effect present.  - Rare tumor cells in one of fourteen lymph nodes (1/14).  - Benign gallbladder with chronic inflammation and benign lymph node.  - Bile duct with granulation tissue and reactive changes.  - Benign appendix.  - Mild reactive gastropathy.  - See oncology table.   B. LYMPH NODE, PORTAL, EXCISION:  - Three of three lymph nodes negative for carcinoma (0/3).   C. LYMPH NODE, COMMON HEPATIC ARTERY, EXCISION:  - Three of three lymph nodes negative for carcinoma (0/3).   D. APPENDIX, APPENDECTOMY:  -  Benign appendix.   E. LYMPH NODE, AORTOCAVAL, EXCISION:  - One of  one lymph node negative for carcinoma (0/1).    03/24/2020 Cancer Staging   Staging form: Exocrine Pancreas, AJCC 8th Edition - Pathologic stage from 03/24/2020: Stage IIB (pT1c, pN1, cM0) - Signed by Truitt Merle, MD on 04/16/2020   04/14/2020 Imaging   CT AP  IMPRESSION: 1. Interval Whipple procedure. Pancreatic stent in place. 2. Ill-defined soft tissue in the right abdominal wall extending from the subcutaneous tissues to the anterior abdominal wall musculature, but no focal fluid collection. This may represent postsurgical change, sterility indeterminate by imaging. 3. Pelvic anatomy is poorly defined, including pelvic bowel loops, adnexa, and colon. There appears to be sigmoid colonic wall thickening as well as intraluminal fluid, with additional diffuse colonic wall thickening from the transverse colon distally. Findings are suggestive of generalized colitis. Attention to this area on follow-up is recommended. 4. Small amount of non organized free fluid in the right upper quadrant. No evidence of intra-abdominal abscess. 5. Periportal edema, nonspecific. Previous biliary stent has been removed. Common bile duct is not well-defined on the current exam. 6. Mild splenomegaly. 7. Trace right pleural effusion.   05/19/2020 - 08/04/2020 Chemotherapy   Adjuvant Gemcitabine 2 weeks on 1 week off starting 05/19/20 and Xeloda started on 05/25/2020. Xeloda dose currently at 1563m in the AM and 10064min the PM. Completed 08/04/20.    08/18/2020 Imaging   CT CAP  IMPRESSION: 1. Interval improvement in previously demonstrated ascites and generalized mesenteric edema. No peritoneal nodularity or focal extraluminal fluid collection identified. 2. Stable postsurgical changes from previous Whipple procedure. No evidence of local recurrence or metastatic disease. 3. Pancreatic stent remains in place without pancreatic ductal dilatation. Although there is no pneumobilia, there is no significant  biliary dilatation. 4. Stable mild splenomegaly. 5. No significant findings in the chest.      CURRENT THERAPY:  Surveillance   INTERVAL HISTORY:  Breanna DURFLINGERs here for a follow up. She was last seen by me 07/22/20. She presents to the clinic with her husband. She notes since stopping chemo she feels she is recovering. She is able to walk more and faster. She notes her appetite and eating is adequate. She denies any open surgical wound. She notes her BM are normal. She notes she will have occasional diarrhea for 2 hours, which she feels is related to what she eats. Last episode was yesterday morning. This will occur once a week. She will F/u with Dr ByBarry Dienesn 08/25/20.   I reviewed her medication list with her. She stopped Creon given bowel cramps from medication.     REVIEW OF SYSTEMS:   Constitutional: Denies fevers, chills or abnormal weight loss Eyes: Denies blurriness of vision Ears, nose, mouth, throat, and face: Denies mucositis or sore throat Respiratory: Denies cough, dyspnea or wheezes Cardiovascular: Denies palpitation, chest discomfort or lower extremity swelling Gastrointestinal:  Denies nausea, heartburn or change in bowel habits Skin: Denies abnormal skin rashes Lymphatics: Denies new lymphadenopathy or easy bruising Neurological:Denies numbness, tingling or new weaknesses Behavioral/Psych: Mood is stable, no new changes  All other systems were reviewed with the patient and are negative.  MEDICAL HISTORY:  Past Medical History:  Diagnosis Date  [facility]   [facility]  . Cataracts, bilateral   . Diverticulosis 12/07/2001   found on colonoscopy  . Family history of colon cancer   . Family history of pancreatic cancer   . Hypothyroid 10/2007  .  Macular hole   . Pancreatic mass   . PONV (postoperative nausea and vomiting)     SURGICAL HISTORY: Past Surgical History:  Procedure Laterality Date  . APPENDECTOMY N/A 03/24/2020   Procedure: APPENDECTOMY;   Surgeon: Stark Klein, MD;  Location: [place];  Service: General;  Laterality: N/A;  . BILIARY STENT PLACEMENT  10/10/2019   Procedure: BILIARY STENT PLACEMENT;  Surgeon: Arta Silence, MD;  Location: [place];  Service: Endoscopy;;  . CATARACT EXTRACTION, BILATERAL    . COLONOSCOPY    . ERCP N/A 10/10/2019   Procedure: ENDOSCOPIC RETROGRADE CHOLANGIOPANCREATOGRAPHY (ERCP);  Surgeon: Arta Silence, MD;  Location: [facility] ENDOSCOPY;  Service: Endoscopy;  Laterality: N/A;  . ESOPHAGOGASTRODUODENOSCOPY N/A 10/10/2019   Procedure: ESOPHAGOGASTRODUODENOSCOPY (EGD);  Surgeon: Arta Silence, MD;  Location[facility] ENDOSCOPY;  Service: Endoscopy;  Laterality: N/A;  . EUS N/A 10/10/2019   Procedure: UPPER ENDOSCOPIC ULTRASOUND (EUS) LINEAR;  Surgeon: Arta Silence, MD;  Location: [place];  Service: Endoscopy;  Laterality: N/A;  . EYE SURGERY    . FINE NEEDLE ASPIRATION  10/10/2019   Procedure: FINE NEEDLE ASPIRATION (FNA) LINEAR;  Surgeon: Arta Silence, MD;  Location: [place];  Service: Endoscopy;;  . IR IMAGING GUIDED PORT INSERTION  10/25/2019  . LAPAROSCOPY N/A 03/24/2020   Procedure: LAPAROSCOPY DIAGNOSTIC;  Surgeon: Stark Klein, MD;  Location: [place];  Service: General;  Laterality: N/A;  . Macular hole surg    . PANCREATIC STENT PLACEMENT  10/10/2019   Procedure: PANCREATIC STENT PLACEMENT;  Surgeon: Arta Silence, MD;  [facility] ENDOSCOPY;  Service: Endoscopy;;  . SPHINCTEROTOMY  10/10/2019   Procedure: Joan Mayans;  Surgeon: Arta Silence, MD;  Location: [facility] ENDOSCOPY;  Service: Endoscopy;;  . TONSILLECTOMY    . TONSILLECTOMY    . WHIPPLE PROCEDURE N/A 03/24/2020   Procedure: WHIPPLE PROCEDURE;  Surgeon: Stark Klein, MD;  Location: [place];  Service: General;  Laterality: N/A;    I have reviewed the social history and family history with the patient and they are unchanged from previous note.  ALLERGIES:  has no allergies on file.  MEDICATIONS:  Current Outpatient  Medications  Medication Sig Dispense Refill  . Ascorbic Acid (VITAMIN C PO) Take 1,000 mg by mouth daily.     . B Complex Vitamins (B-COMPLEX/B-12 PO) Take by mouth. (Patient not taking: No sig reported)    . Calcium Carb-Cholecalciferol (CALCIUM 1000 + D PO) Take 1 tablet by mouth daily.     . diphenhydrAMINE (BENADRYL) 25 MG tablet Take 25 mg by mouth at bedtime.    . ferrous sulfate 325 (65 FE) MG tablet Take 325 mg by mouth daily with breakfast.    . ibuprofen (ADVIL) 200 MG tablet Take 400 mg by mouth every 6 (six) hours as needed for moderate pain.    Marland Kitchen levothyroxine (SYNTHROID, LEVOTHROID) 50 MCG tablet Take 1 tablet (50 mcg total) by mouth daily. 90 tablet 4  . LORazepam (ATIVAN) 0.5 MG tablet Take 1 tablet (0.5 mg total) by mouth daily as needed for anxiety. For anticipatory n/v prior to chemo treatments (Patient not taking: No sig reported) 15 tablet 0  . Multiple Vitamin (MULTIVITAMIN PO) Take 1 tablet by mouth daily.    . mupirocin ointment (BACTROBAN) 2 % Place 1 application into the nose daily as needed (sore in nose). (Patient not taking: Reported on 03/17/2020)    . vitamin B-12 (CYANOCOBALAMIN) 1000 MCG tablet Take 1,000 mcg by mouth daily.     No current facility-administered medications for this visit.  PHYSICAL EXAMINATION: ECOG PERFORMANCE STATUS: 1 - Symptomatic but completely ambulatory  Vitals:   08/21/20 0908  BP: 127/69  Pulse: 90  Resp: 18  Temp: (!) 97.1 F (36.2 C)  SpO2: 100%   Filed Weights   08/21/20 0908  Weight: 111 lb 1.6 oz (50.4 kg)    GENERAL:alert, no distress and comfortable SKIN: skin color, texture, turgor are normal, no rashes or significant lesions EYES: normal, Conjunctiva are pink and non-injected, sclera clear  NECK: supple, thyroid normal size, non-tender, without nodularity LYMPH:  no palpable lymphadenopathy in the cervical, axillary  LUNGS: clear to auscultation and percussion with normal breathing effort HEART: regular rate  & rhythm and no murmurs and no lower extremity edema ABDOMEN:abdomen soft, non-tender and normal bowel sounds (+) Surgical incisions healed well with mild pinkish skin (+) Mild abdominal tenderness Musculoskeletal:no cyanosis of digits and no clubbing  NEURO: alert & oriented x 3 with fluent speech, no focal motor/sensory deficits  LABORATORY DATA:  I have reviewed the data as listed CBC Latest Ref Rng & Units 08/18/2020 07/28/2020 07/22/2020  WBC 4.0 - 10.5 K/uL 4.4 2.6(L) 3.3(L)  Hemoglobin 12.0 - 15.0 g/dL 11.4(L) 9.9(L) 10.1(L)  Hematocrit 36.0 - 46.0 % 35.5(L) 30.6(L) 31.3(L)  Platelets 150 - 400 K/uL 177 165 166     CMP Latest Ref Rng & Units 08/18/2020 07/28/2020 07/22/2020  Glucose 70 - 99 mg/dL 102(H) 96 109(H)  BUN 6 - 20 mg/dL 5(L) 7 5(L)  Creatinine 0.44 - 1.00 mg/dL 0.59 0.58 0.62  Sodium 135 - 145 mmol/L 138 139 141  Potassium 3.5 - 5.1 mmol/L 3.9 3.8 3.8  Chloride 98 - 111 mmol/L 103 103 106  CO2 22 - 32 mmol/L _0 Calcium 8.9 - 10.3 mg/dL 9.0 8.7(L) 8.9  Total Protein 6.5 - 8.1 g/dL 7.1 6.9 6.9  Total Bilirubin 0.3 - 1.2 mg/dL 0.5 0.4 0.3  Alkaline Phos 38 - 126 U/L 129(H) 131(H) 128(H)  AST 15 - 41 U/L 40 32 36  ALT 0 - 44 U/L _1 RADIOGRAPHIC STUDIES: I have personally reviewed the radiological images as listed and agreed with the findings in the report. No results found.   ASSESSMENT & PLAN:  AHSHA HINSLEY is a 60 y.o. female with    1. Pancreatic Cancer, Stage IIB,cT2N1M0, ypT1N1 -She was diagnosed in 10/2019.Her initialUS showed bile duct and gallbladder dilation due to 2-3cm mass at head of pancreas/Uncinate. Her MRI showed her mass is laying close to SMV but not involving it. Her CT CAP from 10/23/19 showedborderline potential adenopathy in the periaortic region which will be followed on restaging scans. There is no definite vascular involvementor distant metastasis. -She underwent EUS and ERCP on 10/10/19 and had 2 stents placed by Dr  Paulita Fujita. Herpancreatic mass biopsyshowed Adenocarcinoma of Pancreas.Endoscopic staging T2N1, noevidence of vascular involvement. -She completed 8 cycles of neoadjuvant FOLFIRINOX7/10/21-10/21/21,tolerated moderately well -She underwent Whipple surgery with Dr Barry Dienes on 03/24/20.Pathshoweda residual 1 cm moderately differentiated adenocarcinoma, and 1 out of 14 positive lymph nodes. Surgical margins were negative. Lymph nodes biopsy from portal, hepatic artery, and aortocaval were all negative. -She completed 4 cycles of adjuvant chemo Gemcitabine/Xeloda 05/19/20-08/04/20. -Her 08/18/20 CT CAP showed mild ascites and Stable postsurgical changes and No evidence of local recurrence or metastatic disease. I personally reviewed scan images and discussed the results with patient today.  -She is recovering well from treatment and surgery. She has occasional diarrhea. She is able to  gain weight, I recommend she work on gaining weight back to baseline. Labs from 08/18/20 showed CBC And CMP WNL except Hg 11.4, BG 102, Alk phos 129.  -We reviewed cancer surveillance. Due to her high risk of recurrence, will repeat CT scan every 6 months for first 2 years. She will keep port for now and continue flushes every 6-8 weeks.  -F/u in 2 months    2. Genetic Testingin 10/2019 was negative for pathogenetic mutations.   3.Pancytopenia, secondary to chemo -Improved after C2 and resolved after stopping chemo. She remains to have mild anemia.    PLAN: -CT CAP reviewed, NED  -Lab, flush, f/u in 2 months    No problem-specific Assessment & Plan notes found for this encounter.   No orders of the defined types were placed in this encounter.  All questions were answered. The patient knows to call the clinic with any problems, questions or concerns. No barriers to learning was detected. The total time spent in the appointment was 30 minutes.     Truitt Merle, MD 08/21/2020   I, Joslyn Devon, am acting as  scribe for Truitt Merle, MD.   I have reviewed the above documentation for accuracy and completeness, and I agree with the above.

## 2020-08-21 ENCOUNTER — Encounter: Payer: Self-pay | Admitting: Hematology

## 2020-08-21 ENCOUNTER — Other Ambulatory Visit: Payer: Self-pay

## 2020-08-21 ENCOUNTER — Inpatient Hospital Stay: Payer: BC Managed Care – PPO | Admitting: Hematology

## 2020-08-21 VITALS — BP 127/69 | HR 90 | Temp 97.1°F | Resp 18 | Ht 65.0 in | Wt 111.1 lb

## 2020-08-21 DIAGNOSIS — C250 Malignant neoplasm of head of pancreas: Secondary | ICD-10-CM

## 2020-08-24 ENCOUNTER — Other Ambulatory Visit: Payer: Self-pay | Admitting: Family Medicine

## 2020-08-24 DIAGNOSIS — C250 Malignant neoplasm of head of pancreas: Secondary | ICD-10-CM | POA: Diagnosis not present

## 2020-08-24 DIAGNOSIS — Z1231 Encounter for screening mammogram for malignant neoplasm of breast: Secondary | ICD-10-CM

## 2020-10-09 ENCOUNTER — Other Ambulatory Visit: Payer: Self-pay

## 2020-10-12 ENCOUNTER — Encounter: Payer: Self-pay | Admitting: Hematology

## 2020-10-12 ENCOUNTER — Other Ambulatory Visit: Payer: Self-pay

## 2020-10-12 NOTE — Progress Notes (Signed)
[place]   Telephone:([phone]) [phone] Fax:([phone]) [phone]   Clinic Follow up Note   Patient Care Team: Vernie Shanks, MD as PCP - General (Family Medicine) Fontaine, Belinda Block, MD (Inactive) as Consulting Physician (Gynecology) Truitt Merle, MD as Consulting Physician (Oncology) Arta Silence, MD as Consulting Physician (Gastroenterology) Stark Klein, MD as Consulting Physician (General Surgery) Karie Mainland, RD as Dietitian (Nutrition)  Date of Service:  10/16/2020  CHIEF COMPLAINT: F/u of Pancreatic Cancer  SUMMARY OF ONCOLOGIC HISTORY: Oncology History Overview Note  Cancer Staging Pancreatic cancer [facility]) Staging form: Exocrine Pancreas, AJCC 8th Edition - Clinical stage from 10/16/2019: Stage IIB (cT2, cN1, cM0) - Signed by Truitt Merle, MD on 10/16/2019    Pancreatic cancer ([place])  10/03/2019 Imaging   US Abdomen 10/03/19  IMPRESSION: Markedly dilated bile ducts. Dilated gallbladder with sludge but no gallstones.   2.9 cm mass in the uncinate process most likely pancreatic neoplasm causing biliary obstruction. Recommend MRI of the pancreas without with contrast for further evaluation. If the patient cannot have MRI, CT of the pancreas with contrast recommended.   10/07/2019 Imaging   MRI Abdomen 10/07/19  IMPRESSION: 1. There is a hypoenhancing mass of the pancreatic uncinate with abrupt obstruction of the central common bile duct, measuring approximately 2.9 cm, consistent with pancreatic adenocarcinoma. 2. Gross intra and extrahepatic biliary ductal dilatation, common bile duct measuring 1.4 cm. Gallbladder hydrops. 3. The pancreatic duct is nondilated. 4. Pancreatic mass lies closely adjacent to the most central portions of the superior mesenteric vein and central portal vein, due to motion artifact it is difficult to clearly discern whether there is a preserved fat plane. Multiphasic contrast enhanced pancreatic protocol CT may be less motion sensitive  and better delineate vascular structures for the purposes of surgical planning if necessary. 5. No evidence of lymphadenopathy or metastatic disease in the abdomen.   10/10/2019 Procedure   EUS by Dr Paulita Fujita 10/10/19  IMPRESSION - A mass was identified at junction of head/uncinate process of the pancreas. This was staged T2 N1 Mx by endosonographic criteria. Fine needle aspiration performed. - A few abnormal lymph nodes were visualized in the peripancreatic region. - There was dilation in the common bile duct which measured up to 14 mm. - Hyperechoic material consistent with sludge was visualized endosonographically in the gallbladder.   10/10/2019 Procedure   ERCP by Dr Paulita Fujita 10/10/19  IMPRESSION - The major papilla appeared normal. - One temporary stent was placed into the ventral pancreatic duct. - A biliary sphincterotomy was performed. - One covered metal stent was placed into the common bile duct.   10/10/2019 Initial Biopsy   FINAL MICROSCOPIC DIAGNOSIS: 10/10/19  A. PANCREAS, HEAD, FINE NEEDLE ASPIRATION:  - Malignant cells present, consistent with adenocarcinoma    10/16/2019 Initial Diagnosis   Pancreatic cancer ([place])    10/16/2019 Cancer Staging   Staging form: Exocrine Pancreas, AJCC 8th Edition - Clinical stage from 10/16/2019: Stage IIB (cT2, cN1, cM0) - Signed by Truitt Merle, MD on 10/16/2019    10/23/2019 Imaging   CT CAP W contrast  IMPRESSION: 1. Pancreatic mass with local adenopathy and potential retroperitoneal adenopathy. Given retroperitoneal lymph nodes and presence of venous collaterals PET scan may be useful for staging purposes. 2. No definite vascular involvement or signs of upper abdominal venous collaterals. Some stranding around the celiac may be due to recent biopsy. 3. Borderline enlarged RIGHT external iliac lymph node. Attention on follow-up. 4. No evidence of metastatic disease in the  chest. 5. Signs of recent ERCP and stent placement.    10/25/2019 Procedure   PAC placed    11/09/2019 - 02/20/2020 Chemotherapy   Neoadjuvant FOLFIRINOX q2weeks starting 11/09/19-02/20/20 for 8 cycles   11/28/2019 Genetic Testing   Negative genetic testing on the common hereditary cancer panel, pancreatic cancer panel, preliminary evidence pancreatic cancer panel and chronic pancreatitis panel.  The Common Hereditary Gene Panel offered by [facility] includes sequencing and/or deletion duplication testing of the following 55 genes: APC*, ATM*, AXIN2, BARD1, BMPR1A, BRCA1, BRCA2, BRIP1, CASR, CDH1, CDK4, CDKN2A (p14ARF), CDKN2A (p16INK4a), CFTR*, CHEK2, CPA1, CTNNA1, CTRC, DICER1*, EPCAM*, FANCC, GREM1*, HOXB13, KIT, MEN1*, MLH1*, MSH2*, MSH3*, MSH6*, MUTYH, NBN, NF1*, NTHL1, PALB2, PALLD, PDGFRA, PMS2*, POLD1*, POLE, PRSS1*, PTEN*, RAD50, RAD51C, RAD51D, SDHA*, SDHB, SDHC*, SDHD, SMAD4, SMARCA4, SPINK1, STK11, TP53, TSC1*, TSC2, and VHL .  The report date is November 28, 2019.   01/28/2020 Imaging   CT AP w contrast  IMPRESSION: 1. No substantial interval change in exam. 2. Similar appearance of the pancreatic mass with associated main pancreatic ductal dilatation. 3. Soft tissue stranding again noted around the celiac axis, in the para-aortic retroperitoneal space and along the IMA (new in the interval). These findings are associated with similar appearance of upper normal to borderline hepatoduodenal ligament and retroperitoneal lymphadenopathy. Close continued attention on follow-up recommended. 4. Borderline enlarged right external iliac node identified on previous study has decreased in size in the interval.   02/14/2020 PET scan   IMPRESSION: 1. The pancreatic mass has a maximum SUV of 6.1. No findings of hypermetabolic local adenopathy or distant metastatic spread. 2. Splenomegaly. 3. Inspissated barium or appendicolith within the appendix. No signs of appendiceal inflammation. 4. Scattered sigmoid colon diverticula. ADDENDUM: The original  report was by Dr. Van Clines. The following addendum is by Dr. Van Clines:   On the CT from 01/28/2020 there were some upper normal sized/borderline retroperitoneal lymph nodes including a 0.8 cm aortocaval node on image 72 of series 9. This currently has a maximum SUV of 2.2, which is similar to blood pool activity. An indistinctly marginated lymph node just below the left renal vein has a maximum SUV of 2.4, just above blood pool levels., and measures about 1.0 cm in diameter. Lymph nodes anterior to the left psoas muscle have maximum SUV of 2.0, similar to blood pool. Overall these lymph nodes are borderline enlarged and with activity level at or just minimally above blood pool. While certainly not grossly positive, their proximity to the pancreatic head mass and borderline imaging characteristics make it difficult to exclude early nodal involvement by malignancy.   03/24/2020 Surgery   Whipple surgery, Laparoscopic Diagnostic, Appendectomy by Dr Barry Dienes    03/24/2020 Pathology Results   FINAL MICROSCOPIC DIAGNOSIS:   A. WHIPPLE PROCEDURE, WITH GALLBLADDER:  - Adenocarcinoma, moderately differentiated, spanning 1 cm.  - Tumor involves retroperitoneal peripancreatic soft tissue.  - Resection margins are negative.  - Treatment effect present.  - Rare tumor cells in one of fourteen lymph nodes (1/14).  - Benign gallbladder with chronic inflammation and benign lymph node.  - Bile duct with granulation tissue and reactive changes.  - Benign appendix.  - Mild reactive gastropathy.  - See oncology table.   B. LYMPH NODE, PORTAL, EXCISION:  - Three of three lymph nodes negative for carcinoma (0/3).   C. LYMPH NODE, COMMON HEPATIC ARTERY, EXCISION:  - Three of three lymph nodes negative for carcinoma (0/3).   D. APPENDIX, APPENDECTOMY:  - Benign appendix.  E. LYMPH NODE, AORTOCAVAL, EXCISION:  - One of one lymph node negative for carcinoma (0/1).    03/24/2020  Cancer Staging   Staging form: Exocrine Pancreas, AJCC 8th Edition - Pathologic stage from 03/24/2020: Stage IIB (pT1c, pN1, cM0) - Signed by Truitt Merle, MD on 04/16/2020    04/14/2020 Imaging   CT AP  IMPRESSION: 1. Interval Whipple procedure. Pancreatic stent in place. 2. Ill-defined soft tissue in the right abdominal wall extending from the subcutaneous tissues to the anterior abdominal wall musculature, but no focal fluid collection. This may represent postsurgical change, sterility indeterminate by imaging. 3. Pelvic anatomy is poorly defined, including pelvic bowel loops, adnexa, and colon. There appears to be sigmoid colonic wall thickening as well as intraluminal fluid, with additional diffuse colonic wall thickening from the transverse colon distally. Findings are suggestive of generalized colitis. Attention to this area on follow-up is recommended. 4. Small amount of non organized free fluid in the right upper quadrant. No evidence of intra-abdominal abscess. 5. Periportal edema, nonspecific. Previous biliary stent has been removed. Common bile duct is not well-defined on the current exam. 6. Mild splenomegaly. 7. Trace right pleural effusion.   05/19/2020 - 08/04/2020 Chemotherapy   Adjuvant Gemcitabine 2 weeks on 1 week off starting 05/19/20 and Xeloda started on 05/25/2020. Xeloda dose currently at 1511m in the AM and 10012min the PM. Completed 08/04/20.    08/18/2020 Imaging   CT CAP  IMPRESSION: 1. Interval improvement in previously demonstrated ascites and generalized mesenteric edema. No peritoneal nodularity or focal extraluminal fluid collection identified. 2. Stable postsurgical changes from previous Whipple procedure. No evidence of local recurrence or metastatic disease. 3. Pancreatic stent remains in place without pancreatic ductal dilatation. Although there is no pneumobilia, there is no significant biliary dilatation. 4. Stable mild splenomegaly. 5. No  significant findings in the chest.      CURRENT THERAPY:  Surveillance   INTERVAL HISTORY:  LoEMMACLAIRE SWITALAs here for a follow up of pancreatic cancer. She was last seen by me 2 months ago. She presents to the clinic with her husband. She feels she has recovered from prior treatment and surgery well. She denies any new changes. She notes she feels good. She notes she was seen by Gyn 3 days ago. She denies any current abdominal pain, her appetite and energy is adequate. She notes she is able to watch her toddler grandchildren. She notes she is eating mostly regular portions of meals. She continues to f/u with Dr ByBarry DienesI reviewed her medication list with her. She is not taking B complex.    REVIEW OF SYSTEMS:   Constitutional: Denies fevers, chills or abnormal weight loss Eyes: Denies blurriness of vision Ears, nose, mouth, throat, and face: Denies mucositis or sore throat Respiratory: Denies cough, dyspnea or wheezes Cardiovascular: Denies palpitation, chest discomfort or lower extremity swelling Gastrointestinal:  Denies nausea, heartburn or change in bowel habits Skin: Denies abnormal skin rashes Lymphatics: Denies new lymphadenopathy or easy bruising Neurological:Denies numbness, tingling or new weaknesses Behavioral/Psych: Mood is stable, no new changes  All other systems were reviewed with the patient and are negative.  MEDICAL HISTORY:  Past Medical History:  Diagnosis Date   [facility]   Cataracts, bilateral    Diverticulosis 12/07/2001   found on colonoscopy   Family history of colon cancer    Family history of pancreatic cancer    Hypothyroid 10/2007   Macular hole    Pancreatic mass  PONV (postoperative nausea and vomiting)     SURGICAL HISTORY: Past Surgical History:  Procedure Laterality Date   APPENDECTOMY N/A 03/24/2020   Procedure: APPENDECTOMY;  Surgeon: Stark Klein, MD;  Location: [place];  Service: General;  Laterality: N/A;   BILIARY  STENT PLACEMENT  10/10/2019   Procedure: BILIARY STENT PLACEMENT;  Surgeon: Arta Silence, MD;  Location: [place];  Service: Endoscopy;;   CATARACT EXTRACTION, BILATERAL     COLONOSCOPY     ERCP N/A 10/10/2019   Procedure: ENDOSCOPIC RETROGRADE CHOLANGIOPANCREATOGRAPHY (ERCP);  Surgeon: Arta Silence, MD;  Location: [facility] ENDOSCOPY;  Service: Endoscopy;  Laterality: N/A;   ESOPHAGOGASTRODUODENOSCOPY N/A 10/10/2019   Procedure: ESOPHAGOGASTRODUODENOSCOPY (EGD);  Surgeon: Arta Silence, MD;  Location: [facility] ENDOSCOPY;  Service: Endoscopy;  Laterality: N/A;   EUS N/A 10/10/2019   Procedure: UPPER ENDOSCOPIC ULTRASOUND (EUS) LINEAR;  Surgeon: Arta Silence, MD;  Location: [place];  Service: Endoscopy;  Laterality: N/A;   EYE SURGERY     FINE NEEDLE ASPIRATION  10/10/2019   Procedure: FINE NEEDLE ASPIRATION (FNA) LINEAR;  Surgeon: Arta Silence, MD;  Location: [place] ENDOSCOPY;  Service: Endoscopy;;   IR IMAGING GUIDED PORT INSERTION  10/25/2019   LAPAROSCOPY N/A 03/24/2020   Procedure: LAPAROSCOPY DIAGNOSTIC;  Surgeon: Stark Klein, MD;  Location: [place];  Service: General;  Laterality: N/A;   Macular hole surg     PANCREATIC STENT PLACEMENT  10/10/2019   Procedure: PANCREATIC STENT PLACEMENT;  Surgeon: Arta Silence, MD;  Location: [facility] ENDOSCOPY;  Service: Endoscopy;;   SPHINCTEROTOMY  10/10/2019   Procedure: Joan Mayans;  Surgeon: Arta Silence, MD;  Location: [place];  Service: Endoscopy;;   TONSILLECTOMY     TONSILLECTOMY     WHIPPLE PROCEDURE N/A 03/24/2020   Procedure: WHIPPLE PROCEDURE;  Surgeon: Stark Klein, MD;  Location: [place];  Service: General;  Laterality: N/A;    Breanna Noble have reviewed the social history and family history with the patient and they are unchanged from previous note.  ALLERGIES:  has No Known Allergies.  MEDICATIONS:  Current Outpatient Medications  Medication Sig Dispense Refill   Ascorbic Acid (VITAMIN C PO) Take 1,000 mg by mouth daily.      B  Complex Vitamins (B-COMPLEX/B-12 PO) Take by mouth.     Calcium Carb-Cholecalciferol (CALCIUM 1000 + D PO) Take 1 tablet by mouth daily.      ferrous sulfate 325 (65 FE) MG tablet Take 325 mg by mouth daily with breakfast.     ibuprofen (ADVIL) 200 MG tablet Take 400 mg by mouth every 6 (six) hours as needed for moderate pain.     levothyroxine (SYNTHROID, LEVOTHROID) 50 MCG tablet Take 1 tablet (50 mcg total) by mouth daily. 90 tablet 4   Multiple Vitamin (MULTIVITAMIN PO) Take 1 tablet by mouth daily.     vitamin B-12 (CYANOCOBALAMIN) 1000 MCG tablet Take 1,000 mcg by mouth daily.     No current facility-administered medications for this visit.    PHYSICAL EXAMINATION: ECOG PERFORMANCE STATUS: 1 - Symptomatic but completely ambulatory  Vitals:   10/16/20 1323  BP: 107/63  Pulse: 63  Resp: 16  Temp: 97.8 F (36.6 C)  SpO2: 100%   Filed Weights   10/16/20 1323  Weight: 115 lb 4.8 oz (52.3 kg)    GENERAL:alert, no distress and comfortable SKIN: skin color, texture, turgor are normal, no rashes or significant lesions EYES: normal, Conjunctiva are pink and non-injected, sclera clear  NECK: supple, thyroid normal size, non-tender, without nodularity LYMPH:  no palpable  lymphadenopathy in the cervical, axillary  LUNGS: clear to auscultation and percussion with normal breathing effort HEART: regular rate & rhythm and no murmurs and no lower extremity edema ABDOMEN:abdomen soft, non-tender and normal bowel sounds (+) Surgical incision healed well with minimal scar tissue and mild tenderness.  Musculoskeletal:no cyanosis of digits and no clubbing  NEURO: alert & oriented x 3 with fluent speech, no focal motor/sensory deficits  LABORATORY DATA:  Breanna Noble have reviewed the data as listed CBC Latest Ref Rng & Units 10/16/2020 08/18/2020 07/28/2020  WBC 4.0 - 10.5 K/uL 4.7 4.4 2.6(L)  Hemoglobin 12.0 - 15.0 g/dL 11.8(L) 11.4(L) 9.9(L)  Hematocrit 36.0 - 46.0 % 36.3 35.5(L) 30.6(L)  Platelets  150 - 400 K/uL 140(L) 177 165     CMP Latest Ref Rng & Units 08/18/2020 07/28/2020 07/22/2020  Glucose 70 - 99 mg/dL 102(H) 96 109(H)  BUN 6 - 20 mg/dL 5(L) 7 5(L)  Creatinine 0.44 - 1.00 mg/dL 0.59 0.58 0.62  Sodium 135 - 145 mmol/L 138 139 141  Potassium 3.5 - 5.1 mmol/L 3.9 3.8 3.8  Chloride 98 - 111 mmol/L 103 103 106  CO2 22 - 32 mmol/L _0 Calcium 8.9 - 10.3 mg/dL 9.0 8.7(L) 8.9  Total Protein 6.5 - 8.1 g/dL 7.1 6.9 6.9  Total Bilirubin 0.3 - 1.2 mg/dL 0.5 0.4 0.3  Alkaline Phos 38 - 126 U/L 129(H) 131(H) 128(H)  AST 15 - 41 U/L 40 32 36  ALT 0 - 44 U/L _1 RADIOGRAPHIC STUDIES: Breanna Noble have personally reviewed the radiological images as listed and agreed with the findings in the report. No results found.   ASSESSMENT & PLAN:  Breanna Noble is a 60 y.o. female with   1. Pancreatic Cancer, Stage IIB, cT2N1M0, ypT1N1 -She was diagnosed in 10/2019. Her initial US showed bile duct and gallbladder dilation due to 2-3cm mass at head of pancreas/Uncinate. Her MRI showed her mass is laying close to SMV but not involving it. Her CT CAP from 10/23/19 showed borderline potential adenopathy in the periaortic region which will be followed on restaging scans. There is no definite vascular involvement or distant metastasis.  -She underwent EUS and ERCP on 10/10/19 and had 2 stents placed by Dr Paulita Fujita. Her pancreatic mass biopsy showed Adenocarcinoma of Pancreas. Endoscopic staging T2N1, no evidence of vascular involvement. -She completed 8 cycles of neoadjuvant FOLFIRINOX 11/09/19-02/20/20, tolerated moderately well -She underwent Whipple surgery with Dr Barry Dienes on 03/24/20. Path showed a residual 1 cm moderately differentiated adenocarcinoma, and 1 out of 14 positive lymph nodes.  Surgical margins were negative.  Lymph nodes biopsy from portal, hepatic artery, and aortocaval were all negative. -She completed 4 cycles of adjuvant chemo Gemcitabine/Xeloda 05/19/20-08/04/20. She is currently on  surveillance.  -She has overall recovered from prior treatment and surgery. Breanna Noble reviewed things to watch at home. Labs reviewed, CBC and CMP WNL except Hg 11.8, plt 140K. Physical exam unremarkable.  -Continue surveillance. Next mammogram in 01/2021. She will keep her Port for now and continue flush every 6-8 weeks.  -F/u in 2 months. Breanna Noble will order scan at that time.     2. Genetic Testing in 10/2019 was negative for pathogenetic mutations.   3. Pancytopenia, secondary to chemo  -Improved after C2 and resolved after stopping chemo. She remains to have mild anemia. Continues to improve slowly.      PLAN:  -- She is clinically doing well, continue cancer surveillance  -lab, flush,  f/u in 2 months, will order surveillance CT scan on next visit   No problem-specific Assessment & Plan notes found for this encounter.   No orders of the defined types were placed in this encounter.  All questions were answered. The patient knows to call the clinic with any problems, questions or concerns. No barriers to learning was detected. The total time spent in the appointment was 25 minutes.     Truitt Merle, MD 10/16/2020   Breanna Noble, Breanna Noble, am acting as scribe for Truitt Merle, MD.   Breanna Noble have reviewed the above documentation for accuracy and completeness, and Breanna Noble agree with the above.

## 2020-10-13 ENCOUNTER — Other Ambulatory Visit: Payer: Self-pay

## 2020-10-13 ENCOUNTER — Encounter: Payer: Self-pay | Admitting: Nurse Practitioner

## 2020-10-13 ENCOUNTER — Ambulatory Visit
Admission: RE | Admit: 2020-10-13 | Discharge: 2020-10-13 | Disposition: A | Discharge status: Home / Self Care | Payer: BC Managed Care – PPO | Source: Ambulatory Visit | Attending: Family Medicine | Admitting: Family Medicine

## 2020-10-13 ENCOUNTER — Ambulatory Visit (INDEPENDENT_AMBULATORY_CARE_PROVIDER_SITE_OTHER): Payer: BC Managed Care – PPO | Admitting: Nurse Practitioner

## 2020-10-13 VITALS — BP 122/74 | Ht 65.5 in | Wt 115.0 lb

## 2020-10-13 DIAGNOSIS — Z780 Asymptomatic menopausal state: Secondary | ICD-10-CM | POA: Diagnosis not present

## 2020-10-13 DIAGNOSIS — N83201 Unspecified ovarian cyst, right side: Secondary | ICD-10-CM | POA: Diagnosis not present

## 2020-10-13 DIAGNOSIS — Z01419 Encounter for gynecological examination (general) (routine) without abnormal findings: Secondary | ICD-10-CM | POA: Diagnosis not present

## 2020-10-13 DIAGNOSIS — Z1231 Encounter for screening mammogram for malignant neoplasm of breast: Secondary | ICD-10-CM

## 2020-10-13 NOTE — Progress Notes (Signed)
Breanna Noble June 24, 1960 [id number]   History:  60 y.o. G2P2 presents for annual exam. Postmenopausal - no HRT, no bleeding. Normal pap and mammogram history. Persistent unchanged right ovarian cyst first seen in 2018, normal CA-125 in the past. Options discussed with patient in the past and she prefers no intervention at this time. Hypothyroidism managed by PCP. Diagnosed with pancreatic cancer 10/2019 stage 2B- whipple procedure with appendectomy, chemo x 2. Has follow up with oncology this week.   Gynecologic History Patient's last menstrual period was 11/22/2013.   Contraception/Family planning: post menopausal status  Health Maintenance Last Pap: 01/24/2018. Results were: Normal Last mammogram: 10/13/2020. Results were: no report yet Last colonoscopy: 2014. Results were: Normal, 10-year recall Last Dexa:  Not indicated  Past medical history, past surgical history, family history and social history were all reviewed and documented in the EPIC chart. Married. Retired. 2 children. Keeps 2 and 59 yo grandsons during the week.   ROS:  A ROS was performed and pertinent positives and negatives are included.  Exam:  Vitals:   10/13/20 1049  BP: 122/74  Weight: 115 lb (52.2 kg)  Height: 5' 5.5" (1.664 m)   Body mass index is 18.85 kg/m.  General appearance:  Normal Thyroid:  Symmetrical, normal in size, without palpable masses or nodularity. Respiratory  Auscultation:  Clear without wheezing or rhonchi Cardiovascular  Auscultation:  Regular rate, without rubs, murmurs or gallops  Edema/varicosities:  Not grossly evident Abdominal  Soft,nontender, without masses, guarding or rebound.  Liver/spleen:  No organomegaly noted  Hernia:  None appreciated  Skin  Inspection:  Grossly normal Breasts: Examined lying and sitting.   Right: Without masses, retractions, nipple discharge or axillary adenopathy.   Left: Without masses, retractions, nipple discharge or axillary  adenopathy. Genitourinary   Inguinal/mons:  Normal without inguinal adenopathy  External genitalia:  Normal appearing vulva with no masses, tenderness, or lesions  BUS/Urethra/Skene's glands:  Normal  Vagina:  Normal appearing with normal color and discharge, no lesions. Atrophic changes.   Cervix:  Normal appearing without discharge or lesions  Uterus:  Normal in size, shape and contour.  Midline and mobile, nontender  Adnexa/parametria:     Rt: Normal in size, without masses or tenderness.   Lt: Normal in size, without masses or tenderness.  Anus and perineum: Normal  Digital rectal exam: Normal sphincter tone without palpated masses or tenderness  Assessment/Plan:  60 y.o. G2P2 for annual exam.   Well female exam with routine gynecological exam - Education provided on SBEs, importance of preventative screenings, current guidelines, high calcium diet, regular exercise, and multivitamin daily. Labs with oncology and PCP.   Right ovarian cyst - Plan: US PELVIS TRANSVAGINAL NON-OB (TV ONLY). Persistent unchanged right ovarian cyst first seen in 2018, normal CA-125 in the past. Options discussed with patient in the past and she prefers no intervention at this time. Most recent ultrasound 06/2020 showed right ovarian solid cystic mass 15 x 16 x 14 mm with increased color flow, 6 mm calcification lateral to mass. Asymptomatic. Normal exam today.   Postmenopausal - no HRT, no bleeding.   Screening for cervical cancer - Normal Pap history.  Will repeat at 5-year interval per guidelines.  Screening for breast cancer - Normal mammogram history.  Continue annual screenings.  Normal breast exam today.  Screening for colon cancer - 2014 colonoscopy. Will repeat at GI's recommended interval.   Screening for osteoporosis - will plan for DXA next year.   Return in 1  year for annual.    Tamela Gammon DNP, 11:09 AM 10/13/2020

## 2020-10-16 ENCOUNTER — Other Ambulatory Visit: Payer: Self-pay

## 2020-10-16 ENCOUNTER — Inpatient Hospital Stay: Payer: BC Managed Care – PPO | Attending: Hematology

## 2020-10-16 ENCOUNTER — Inpatient Hospital Stay: Payer: BC Managed Care – PPO | Admitting: Hematology

## 2020-10-16 ENCOUNTER — Other Ambulatory Visit: Payer: Self-pay | Admitting: Hematology

## 2020-10-16 ENCOUNTER — Encounter: Payer: Self-pay | Admitting: Hematology

## 2020-10-16 VITALS — BP 107/63 | HR 63 | Temp 97.8°F | Resp 16 | Ht 65.5 in | Wt 115.3 lb

## 2020-10-16 DIAGNOSIS — C250 Malignant neoplasm of head of pancreas: Secondary | ICD-10-CM

## 2020-10-16 DIAGNOSIS — Z8507 Personal history of malignant neoplasm of pancreas: Secondary | ICD-10-CM | POA: Insufficient documentation

## 2020-10-16 DIAGNOSIS — D649 Anemia, unspecified: Secondary | ICD-10-CM | POA: Diagnosis not present

## 2020-10-16 LAB — CBC WITH DIFFERENTIAL (CANCER CENTER ONLY)
Abs Immature Granulocytes: 0.01 10*3/uL (ref 0.00–0.07)
Basophils Absolute: 0 10*3/uL (ref 0.0–0.1)
Basophils Relative: 0 %
Eosinophils Absolute: 0 10*3/uL (ref 0.0–0.5)
Eosinophils Relative: 1 %
HCT: 36.3 % (ref 36.0–46.0)
Hemoglobin: 11.8 g/dL — ABNORMAL LOW (ref 12.0–15.0)
Immature Granulocytes: 0 %
Lymphocytes Relative: 40 %
Lymphs Abs: 1.9 10*3/uL (ref 0.7–4.0)
MCH: 29.8 pg (ref 26.0–34.0)
MCHC: 32.5 g/dL (ref 30.0–36.0)
MCV: 91.7 fL (ref 80.0–100.0)
Monocytes Absolute: 0.6 10*3/uL (ref 0.1–1.0)
Monocytes Relative: 13 %
Neutro Abs: 2.2 10*3/uL (ref 1.7–7.7)
Neutrophils Relative %: 46 %
Platelet Count: 140 10*3/uL — ABNORMAL LOW (ref 150–400)
RBC: 3.96 MIL/uL (ref 3.87–5.11)
RDW: 14.1 % (ref 11.5–15.5)
WBC Count: 4.7 10*3/uL (ref 4.0–10.5)
nRBC: 0 % (ref 0.0–0.2)

## 2020-10-16 LAB — CMP (CANCER CENTER ONLY)
ALT: 33 U/L (ref 0–44)
AST: 33 U/L (ref 15–41)
Albumin: 3.9 g/dL (ref 3.5–5.0)
Alkaline Phosphatase: 112 U/L (ref 38–126)
Anion gap: 7 (ref 5–15)
BUN: 6 mg/dL (ref 6–20)
CO2: 29 mmol/L (ref 22–32)
Calcium: 9.1 mg/dL (ref 8.9–10.3)
Chloride: 103 mmol/L (ref 98–111)
Creatinine: 0.61 mg/dL (ref 0.44–1.00)
GFR, Estimated: >60 mL/min (ref >60)
Glucose, Bld: 115 mg/dL — ABNORMAL HIGH (ref 70–99)
Potassium: 4.2 mmol/L (ref 3.5–5.1)
Sodium: 139 mmol/L (ref 135–145)
Total Bilirubin: 0.5 mg/dL (ref 0.3–1.2)
Total Protein: 6.9 g/dL (ref 6.5–8.1)

## 2020-10-17 LAB — CANCER ANTIGEN 19-9: CA 19-9: 43 U/mL — ABNORMAL HIGH (ref 0–35)

## 2020-10-20 ENCOUNTER — Telehealth: Payer: Self-pay | Admitting: *Deleted

## 2020-10-20 ENCOUNTER — Telehealth: Payer: Self-pay | Admitting: Hematology

## 2020-10-20 DIAGNOSIS — C250 Malignant neoplasm of head of pancreas: Secondary | ICD-10-CM

## 2020-10-20 NOTE — Telephone Encounter (Signed)
Called pt & informed of lab result per Dr Burr Medico.  Pt had already seen on MyChart.  Message to pt & then to scheduler to repeat lab in 1 mo & if still elevated will add scan.

## 2020-10-20 NOTE — Telephone Encounter (Signed)
-----   Message from Truitt Merle, MD sent at 10/18/2020 11:39 PM EDT ----- Please let pt know her CA19.9 was slightly elevated, please add lab and flush appointment in a month. If it goes up more on next lab, will repeat scan. Thanks   Truitt Merle  10/18/2020

## 2020-10-20 NOTE — Telephone Encounter (Signed)
Scheduled appt per 6/21 sch msg. Pt aware.

## 2020-11-03 ENCOUNTER — Other Ambulatory Visit: Payer: Self-pay

## 2020-11-03 ENCOUNTER — Ambulatory Visit (INDEPENDENT_AMBULATORY_CARE_PROVIDER_SITE_OTHER): Payer: BC Managed Care – PPO | Admitting: Obstetrics and Gynecology

## 2020-11-03 ENCOUNTER — Ambulatory Visit (INDEPENDENT_AMBULATORY_CARE_PROVIDER_SITE_OTHER): Payer: BC Managed Care – PPO

## 2020-11-03 ENCOUNTER — Encounter: Payer: Self-pay | Admitting: Obstetrics and Gynecology

## 2020-11-03 VITALS — BP 110/75 | HR 79 | Ht 65.0 in | Wt 114.0 lb

## 2020-11-03 DIAGNOSIS — N83201 Unspecified ovarian cyst, right side: Secondary | ICD-10-CM | POA: Diagnosis not present

## 2020-11-03 DIAGNOSIS — N9489 Other specified conditions associated with female genital organs and menstrual cycle: Secondary | ICD-10-CM | POA: Diagnosis not present

## 2020-11-03 NOTE — Progress Notes (Signed)
GYNECOLOGY  VISIT   HPI: 60 y.o.   Married White or Caucasian Not Hispanic or Latino  female   G2P2 with Patient's last menstrual period was 11/22/2013.   here for a follow up ultrasound for a right ovarian cyst. The cyst was first noted in 2018. Prior normal CA 125.  Her last pelvic ultrasound was on 07/11/2018: Ultrasound transvaginal shows uterus normal size and echotexture.  Endometrial echo 0.7 mm.  Left ovary normal.  Right ovary with continued presence of a solid cystic mass 15 x 16 x 14 mm with increased color flow Doppler.  6 mm calcification lateral to the mass noted.  Small echo-free thin-walled cyst 20 x 19 mm and separate 8 mm cyst.  Cul-de-sac negative.    H/O pancreatic cancer in 10/2019 stage 2B- whipple procedure with appendectomy, chemo x 2. CA 19-9 was recently mildly elevated, she is getting f/u blood work later this month.   In f/u for her pancreatic cancer she has had multiple CT scans. Last pelvic CT was on 08/18/20 The uterus and ovaries appear normal. No adnexal masses.     GYNECOLOGIC HISTORY: Patient's last menstrual period was 11/22/2013. Contraception:PMP Menopausal hormone therapy: none         OB History     Gravida  2   Para  2   Term      Preterm      AB      Living  2      SAB      IAB      Ectopic      Multiple      Live Births                 Patient Active Problem List   Diagnosis Date Noted   Malnutrition of moderate degree 03/26/2020   Port-A-Cath in place 02/03/2020   Genetic testing 11/28/2019   Family history of pancreatic cancer    Family history of colon cancer    Pancreatic cancer ([place]) 10/16/2019   Hypothyroid 12/10/2012   Diverticulosis 12/10/2012   Annual physical exam 12/10/2012   Screening examination for infectious disease 12/10/2012    Past Medical History:  Diagnosis Date   Cancer [facility])    pancreatic   Cataracts, bilateral    Diverticulosis 12/07/2001   found on colonoscopy   Family history  of colon cancer    Family history of pancreatic cancer    Hypothyroid 10/2007   Macular hole    Pancreatic mass    PONV (postoperative nausea and vomiting)     Past Surgical History:  Procedure Laterality Date   APPENDECTOMY N/A 03/24/2020   Procedure: APPENDECTOMY;  Surgeon: Stark Klein, MD;  Location: [place];  Service: General;  Laterality: N/A;   BILIARY STENT PLACEMENT  10/10/2019   Procedure: BILIARY STENT PLACEMENT;  Surgeon: Arta Silence, MD;  Location: [place];  Service: Endoscopy;;   CATARACT EXTRACTION, BILATERAL     COLONOSCOPY     ERCP N/A 10/10/2019   Procedure: ENDOSCOPIC RETROGRADE CHOLANGIOPANCREATOGRAPHY (ERCP);  Surgeon: Arta Silence, MD;  Location: [facility] ENDOSCOPY;  Service: Endoscopy;  Laterality: N/A;   ESOPHAGOGASTRODUODENOSCOPY N/A 10/10/2019   Procedure: ESOPHAGOGASTRODUODENOSCOPY (EGD);  Surgeon: Arta Silence, MD;  Location: [facility] ENDOSCOPY;  Service: Endoscopy;  Laterality: N/A;   EUS N/A 10/10/2019   Procedure: UPPER ENDOSCOPIC ULTRASOUND (EUS) LINEAR;  Surgeon: Arta Silence, MD;  Location: [place];  Service: Endoscopy;  Laterality: N/A;   EYE SURGERY     FINE NEEDLE ASPIRATION  10/10/2019   Procedure: FINE NEEDLE ASPIRATION (FNA) LINEAR;  Surgeon: Arta Silence, MD;  Location: [facility] ENDOSCOPY;  Service: Endoscopy;;   IR IMAGING GUIDED PORT INSERTION  10/25/2019   LAPAROSCOPY N/A 03/24/2020   Procedure: LAPAROSCOPY DIAGNOSTIC;  Surgeon: Stark Klein, MD;  Location: [place];  Service: General;  Laterality: N/A;   Macular hole surg     PANCREATIC STENT PLACEMENT  10/10/2019   Procedure: PANCREATIC STENT PLACEMENT;  Surgeon: Arta Silence, MD;  Location: [facility] ENDOSCOPY;  Service: Endoscopy;;   SPHINCTEROTOMY  10/10/2019   Procedure: Joan Mayans;  Surgeon: Arta Silence, MD;  Location: [facility] ENDOSCOPY;  Service: Endoscopy;;   TONSILLECTOMY     TONSILLECTOMY     WHIPPLE PROCEDURE N/A 03/24/2020   Procedure: WHIPPLE PROCEDURE;  Surgeon: Stark Klein,  MD;  Location: [place];  Service: General;  Laterality: N/A;    Current Outpatient Medications  Medication Sig Dispense Refill   Ascorbic Acid (VITAMIN C PO) Take 1,000 mg by mouth daily.      B Complex Vitamins (B-COMPLEX/B-12 PO) Take by mouth.     Calcium Carb-Cholecalciferol (CALCIUM 1000 + D PO) Take 1 tablet by mouth daily.      ferrous sulfate 325 (65 FE) MG tablet Take 325 mg by mouth daily with breakfast.     ibuprofen (ADVIL) 200 MG tablet Take 400 mg by mouth every 6 (six) hours as needed for moderate pain.     levothyroxine (SYNTHROID, LEVOTHROID) 50 MCG tablet Take 1 tablet (50 mcg total) by mouth daily. 90 tablet 4   Multiple Vitamin (MULTIVITAMIN PO) Take 1 tablet by mouth daily.     vitamin B-12 (CYANOCOBALAMIN) 1000 MCG tablet Take 1,000 mcg by mouth daily.     No current facility-administered medications for this visit.     ALLERGIES: Patient has no known allergies.  Family History  Problem Relation Age of Onset   Hypertension Father    Heart disease Father    Cancer Maternal Grandmother        PANCREATIC   Heart disease Maternal Grandfather    Cancer Maternal Grandfather        lung cancer   Diabetes Paternal Grandfather    Cancer Maternal Uncle 55       Colon   Other Mother        Brain tumor benign   Dementia Maternal Uncle    Dementia Paternal Grandmother    Stroke Paternal Uncle    Breast cancer Neg Hx     Social History   Socioeconomic History   Marital status: Married    Spouse name: Not on file   Number of children: 2   Years of education: Not on file   Highest education level: Not on file  Occupational History   Not on file  Tobacco Use   Smoking status: Never   Smokeless tobacco: Never  Vaping Use   Vaping Use: Never used  Substance and Sexual Activity   Alcohol use: No    Alcohol/week: 0.0 standard drinks   Drug use: No   Sexual activity: Yes    Birth control/protection: Post-menopausal    Comment: 1st intercourse 7 yo-2 partners   Other Topics Concern   Not on file  Social History Narrative   Not on file   Social Determinants of Health   Financial Resource Strain: Not on file  Food Insecurity: Not on file  Transportation Needs: Not on file  Physical Activity: Not on file  Stress: Not on file  Social Connections:  Not on file  Intimate Partner Violence: Not on file    Review of Systems  All other systems reviewed and are negative.  PHYSICAL EXAMINATION:    LMP 11/22/2013     General appearance: alert, cooperative and appears stated age  Pelvic ultrasound  Indications: f/u right ovarian cyst  Findings:  Uterus 5.83 x 4.11 x 2.91 cm  Endometrium 2.15 cm   Left ovary 1.95 x 1.50 x 1.42 cm  Right ovary 2.45 x 2.01 x 2.05 cm  19 x 16 mm simple avascular cyst  11 x 10 mm mixed solid/cystic avascular cyst  In between the 2 cysts is a sold vascular area 9.2 x 7.9 mm From the cini-loop I think it is possible this is her tube folded over itself. It could also be a benign ovary tumor.   No free fluid  Impression:  Normal sized anteverted uterus Thin symmetrical endometrium Normal left adnexa Persistent, stable, complex cystic right adnexal mass  1. Adnexal mass Persistent complex right adnexal cystic mass. Stable since 2018, not seen on recent CT. Prior normal CA 125. I discussed with the patient that I think it is possible this is her tube folded over itself and dilated. If this were a new finding, I would consider removal.Given that it has been stable for 4 years and not seen on CT scan I think it is very unlikely worrisome.  -She is currently being followed for Pancreatic cancer and is having frequent CT scans. -At this point I would recommend a f/u ultrasound in one year, sooner if pelvic findings are noted at her CT scans    CC: Breanna Lowenstein, NP

## 2020-11-19 ENCOUNTER — Other Ambulatory Visit: Payer: Self-pay

## 2020-11-19 ENCOUNTER — Inpatient Hospital Stay: Payer: BC Managed Care – PPO | Attending: Hematology

## 2020-11-19 DIAGNOSIS — C250 Malignant neoplasm of head of pancreas: Secondary | ICD-10-CM

## 2020-11-19 DIAGNOSIS — Z452 Encounter for adjustment and management of vascular access device: Secondary | ICD-10-CM | POA: Diagnosis not present

## 2020-11-19 DIAGNOSIS — Z95828 Presence of other vascular implants and grafts: Secondary | ICD-10-CM

## 2020-11-19 DIAGNOSIS — Z8507 Personal history of malignant neoplasm of pancreas: Secondary | ICD-10-CM | POA: Diagnosis not present

## 2020-11-19 LAB — CBC WITH DIFFERENTIAL (CANCER CENTER ONLY)
Abs Immature Granulocytes: 0.02 10*3/uL (ref 0.00–0.07)
Basophils Absolute: 0 10*3/uL (ref 0.0–0.1)
Basophils Relative: 0 %
Eosinophils Absolute: 0.1 10*3/uL (ref 0.0–0.5)
Eosinophils Relative: 2 %
HCT: 36.3 % (ref 36.0–46.0)
Hemoglobin: 12 g/dL (ref 12.0–15.0)
Immature Granulocytes: 0 %
Lymphocytes Relative: 34 %
Lymphs Abs: 1.7 10*3/uL (ref 0.7–4.0)
MCH: 29.6 pg (ref 26.0–34.0)
MCHC: 33.1 g/dL (ref 30.0–36.0)
MCV: 89.4 fL (ref 80.0–100.0)
Monocytes Absolute: 0.7 10*3/uL (ref 0.1–1.0)
Monocytes Relative: 13 %
Neutro Abs: 2.7 10*3/uL (ref 1.7–7.7)
Neutrophils Relative %: 51 %
Platelet Count: 143 10*3/uL — ABNORMAL LOW (ref 150–400)
RBC: 4.06 MIL/uL (ref 3.87–5.11)
RDW: 14.6 % (ref 11.5–15.5)
WBC Count: 5.2 10*3/uL (ref 4.0–10.5)
nRBC: 0 % (ref 0.0–0.2)

## 2020-11-19 LAB — CMP (CANCER CENTER ONLY)
ALT: 34 U/L (ref 0–44)
AST: 36 U/L (ref 15–41)
Albumin: 4 g/dL (ref 3.5–5.0)
Alkaline Phosphatase: 107 U/L (ref 38–126)
Anion gap: 6 (ref 5–15)
BUN: 8 mg/dL (ref 6–20)
CO2: 29 mmol/L (ref 22–32)
Calcium: 8.9 mg/dL (ref 8.9–10.3)
Chloride: 103 mmol/L (ref 98–111)
Creatinine: 0.52 mg/dL (ref 0.44–1.00)
GFR, Estimated: >60 mL/min (ref >60)
Glucose, Bld: 99 mg/dL (ref 70–99)
Potassium: 4 mmol/L (ref 3.5–5.1)
Sodium: 138 mmol/L (ref 135–145)
Total Bilirubin: 0.5 mg/dL (ref 0.3–1.2)
Total Protein: 6.8 g/dL (ref 6.5–8.1)

## 2020-11-19 MED ORDER — HEPARIN SOD (PORK) LOCK FLUSH 100 UNIT/ML IV SOLN
500.0000 [IU] | Freq: Once | INTRAVENOUS | Status: AC
  Administered 2020-11-19: 500 [IU]
  Filled 2020-11-19: qty 5

## 2020-11-19 MED ORDER — SODIUM CHLORIDE 0.9% FLUSH
10.0000 mL | Freq: Once | INTRAVENOUS | Status: AC
  Administered 2020-11-19: 10 mL
  Filled 2020-11-19: qty 10

## 2020-11-20 LAB — CANCER ANTIGEN 19-9: CA 19-9: 58 U/mL — ABNORMAL HIGH (ref 0–35)

## 2020-11-24 ENCOUNTER — Telehealth: Payer: Self-pay

## 2020-11-24 ENCOUNTER — Other Ambulatory Visit: Payer: Self-pay | Admitting: Hematology

## 2020-11-24 DIAGNOSIS — C250 Malignant neoplasm of head of pancreas: Secondary | ICD-10-CM

## 2020-11-24 NOTE — Telephone Encounter (Signed)
This nurse spoke with patient related to test results and recommendations for CT scan and office visit following with no labs needed per Dr. Burr Medico.  Patient scheduled CT scan for 8/9 and agrees to office visit on 8/11 at 820 am.  No further questions or concerns at this time.

## 2020-12-08 ENCOUNTER — Other Ambulatory Visit: Payer: Self-pay

## 2020-12-08 ENCOUNTER — Encounter (HOSPITAL_COMMUNITY): Payer: Self-pay

## 2020-12-08 ENCOUNTER — Ambulatory Visit (HOSPITAL_COMMUNITY)
Admission: RE | Admit: 2020-12-08 | Discharge: 2020-12-08 | Disposition: A | Discharge status: Home / Self Care | Payer: BC Managed Care – PPO | Source: Ambulatory Visit | Attending: Hematology | Admitting: Hematology

## 2020-12-08 DIAGNOSIS — Z9889 Other specified postprocedural states: Secondary | ICD-10-CM | POA: Diagnosis not present

## 2020-12-08 DIAGNOSIS — R188 Other ascites: Secondary | ICD-10-CM | POA: Diagnosis not present

## 2020-12-08 DIAGNOSIS — C250 Malignant neoplasm of head of pancreas: Secondary | ICD-10-CM | POA: Diagnosis not present

## 2020-12-08 DIAGNOSIS — C259 Malignant neoplasm of pancreas, unspecified: Secondary | ICD-10-CM | POA: Diagnosis not present

## 2020-12-08 DIAGNOSIS — K8689 Other specified diseases of pancreas: Secondary | ICD-10-CM | POA: Diagnosis not present

## 2020-12-08 DIAGNOSIS — I2510 Atherosclerotic heart disease of native coronary artery without angina pectoris: Secondary | ICD-10-CM | POA: Diagnosis not present

## 2020-12-08 MED ORDER — IOHEXOL 350 MG/ML SOLN
80.0000 mL | Freq: Once | INTRAVENOUS | Status: AC | PRN
  Administered 2020-12-08: 80 mL via INTRAVENOUS

## 2020-12-09 ENCOUNTER — Encounter: Payer: Self-pay | Admitting: Nurse Practitioner

## 2020-12-09 ENCOUNTER — Telehealth: Payer: Self-pay

## 2020-12-09 ENCOUNTER — Inpatient Hospital Stay: Payer: BC Managed Care – PPO | Attending: Hematology | Admitting: Nurse Practitioner

## 2020-12-09 DIAGNOSIS — C250 Malignant neoplasm of head of pancreas: Secondary | ICD-10-CM

## 2020-12-09 DIAGNOSIS — Z8507 Personal history of malignant neoplasm of pancreas: Secondary | ICD-10-CM | POA: Insufficient documentation

## 2020-12-09 DIAGNOSIS — Z452 Encounter for adjustment and management of vascular access device: Secondary | ICD-10-CM | POA: Insufficient documentation

## 2020-12-09 NOTE — Telephone Encounter (Signed)
This nurse received call report for CT CAP.  MD has received results.  No further questions or concerns at this time.

## 2020-12-09 NOTE — Progress Notes (Signed)
I spoke with Ms Zipkin and let her know the PET scan appt date, time, and instructions.  She verbalized understanding.

## 2020-12-09 NOTE — Progress Notes (Signed)
[place]   [phone]) [phone] Fax:(336) [phone]   Clinic Follow up Note   Patient Care Team: Vernie Shanks, MD as PCP - General (Family Medicine) Fontaine, Belinda Block, MD (Inactive) as Consulting Physician (Gynecology) Truitt Merle, MD as Consulting Physician (Oncology) Arta Silence, MD as Consulting Physician (Gastroenterology) Stark Klein, MD as Consulting Physician (General Surgery) Karie Mainland, RD as Dietitian (Nutrition) 12/09/2020  I connected with [name] on 12/09/20 at 12:30 PM EDT by telephone visit and verified that I am speaking with the correct person using two identifiers.   I discussed the limitations, risks, security and privacy concerns of performing an evaluation and management service by telemedicine and the availability of in-person appointments. I also discussed with the patient that there may be a patient responsible charge related to this service. The patient expressed understanding and agreed to proceed.   Other persons participating in the visit and their role in the encounter: family in the car but not on speaker  Patient's location: car Provider's location: home office    CHIEF COMPLAINT: Review CT result   SUMMARY OF ONCOLOGIC HISTORY: Oncology History Overview Note  Cancer Staging Pancreatic cancer [facility]) Staging form: Exocrine Pancreas, AJCC 8th Edition - Clinical stage from 10/16/2019: Stage IIB (cT2, cN1, cM0) - Signed by Truitt Merle, MD on 10/16/2019    Pancreatic cancer ([name])  10/03/2019 Imaging   US Abdomen 10/03/19  IMPRESSION: Markedly dilated bile ducts. Dilated gallbladder with sludge but no gallstones.   2.9 cm mass in the uncinate process most likely pancreatic neoplasm causing biliary obstruction. Recommend MRI of the pancreas without with contrast for further evaluation. If the patient cannot have MRI, CT of the pancreas with contrast recommended.   10/07/2019 Imaging   MRI Abdomen 10/07/19  IMPRESSION: 1.  There is a hypoenhancing mass of the pancreatic uncinate with abrupt obstruction of the central common bile duct, measuring approximately 2.9 cm, consistent with pancreatic adenocarcinoma. 2. Gross intra and extrahepatic biliary ductal dilatation, common bile duct measuring 1.4 cm. Gallbladder hydrops. 3. The pancreatic duct is nondilated. 4. Pancreatic mass lies closely adjacent to the most central portions of the superior mesenteric vein and central portal vein, due to motion artifact it is difficult to clearly discern whether there is a preserved fat plane. Multiphasic contrast enhanced pancreatic protocol CT may be less motion sensitive and better delineate vascular structures for the purposes of surgical planning if necessary. 5. No evidence of lymphadenopathy or metastatic disease in the abdomen.   10/10/2019 Procedure   EUS by Dr Paulita Fujita 10/10/19  IMPRESSION - A mass was identified at junction of head/uncinate process of the pancreas. This was staged T2 N1 Mx by endosonographic criteria. Fine needle aspiration performed. - A few abnormal lymph nodes were visualized in the peripancreatic region. - There was dilation in the common bile duct which measured up to 14 mm. - Hyperechoic material consistent with sludge was visualized endosonographically in the gallbladder.   10/10/2019 Procedure   ERCP by Dr Paulita Fujita 10/10/19  IMPRESSION - The major papilla appeared normal. - One temporary stent was placed into the ventral pancreatic duct. - A biliary sphincterotomy was performed. - One covered metal stent was placed into the common bile duct.   10/10/2019 Initial Biopsy   FINAL MICROSCOPIC DIAGNOSIS: 10/10/19  A. PANCREAS, HEAD, FINE NEEDLE ASPIRATION:  - Malignant cells present, consistent with adenocarcinoma    10/16/2019 Initial Diagnosis   Pancreatic cancer ([name])    10/16/2019 Cancer Staging  Staging form: Exocrine Pancreas, AJCC 8th Edition - Clinical stage from 10/16/2019:  Stage IIB (cT2, cN1, cM0) - Signed by Truitt Merle, MD on 10/16/2019    10/23/2019 Imaging   CT CAP W contrast  IMPRESSION: 1. Pancreatic mass with local adenopathy and potential retroperitoneal adenopathy. Given retroperitoneal lymph nodes and presence of venous collaterals PET scan may be useful for staging purposes. 2. No definite vascular involvement or signs of upper abdominal venous collaterals. Some stranding around the celiac may be due to recent biopsy. 3. Borderline enlarged RIGHT external iliac lymph node. Attention on follow-up. 4. No evidence of metastatic disease in the chest. 5. Signs of recent ERCP and stent placement.   10/25/2019 Procedure   PAC placed    11/09/2019 - 02/20/2020 Chemotherapy   Neoadjuvant FOLFIRINOX q2weeks starting 11/09/19-02/20/20 for 8 cycles   11/28/2019 Genetic Testing   Negative genetic testing on the common hereditary cancer panel, pancreatic cancer panel, preliminary evidence pancreatic cancer panel and chronic pancreatitis panel.  The Common Hereditary Gene Panel offered by [facility] includes sequencing and/or deletion duplication testing of the following 55 genes: APC*, ATM*, AXIN2, BARD1, BMPR1A, BRCA1, BRCA2, BRIP1, CASR, CDH1, CDK4, CDKN2A (p14ARF), CDKN2A (p16INK4a), CFTR*, CHEK2, CPA1, CTNNA1, CTRC, DICER1*, EPCAM*, FANCC, GREM1*, HOXB13, KIT, MEN1*, MLH1*, MSH2*, MSH3*, MSH6*, MUTYH, NBN, NF1*, NTHL1, PALB2, PALLD, PDGFRA, PMS2*, POLD1*, POLE, PRSS1*, PTEN*, RAD50, RAD51C, RAD51D, SDHA*, SDHB, SDHC*, SDHD, SMAD4, SMARCA4, SPINK1, STK11, TP53, TSC1*, TSC2, and VHL .  The report date is November 28, 2019.   01/28/2020 Imaging   CT AP w contrast  IMPRESSION: 1. No substantial interval change in exam. 2. Similar appearance of the pancreatic mass with associated main pancreatic ductal dilatation. 3. Soft tissue stranding again noted around the celiac axis, in the para-aortic retroperitoneal space and along the IMA (new in the interval). These findings  are associated with similar appearance of upper normal to borderline hepatoduodenal ligament and retroperitoneal lymphadenopathy. Close continued attention on follow-up recommended. 4. Borderline enlarged right external iliac node identified on previous study has decreased in size in the interval.   02/14/2020 PET scan   IMPRESSION: 1. The pancreatic mass has a maximum SUV of 6.1. No findings of hypermetabolic local adenopathy or distant metastatic spread. 2. Splenomegaly. 3. Inspissated barium or appendicolith within the appendix. No signs of appendiceal inflammation. 4. Scattered sigmoid colon diverticula. ADDENDUM: The original report was by Dr. Van Clines. The following addendum is by Dr. Van Clines:   On the CT from 01/28/2020 there were some upper normal sized/borderline retroperitoneal lymph nodes including a 0.8 cm aortocaval node on image 72 of series 9. This currently has a maximum SUV of 2.2, which is similar to blood pool activity. An indistinctly marginated lymph node just below the left renal vein has a maximum SUV of 2.4, just above blood pool levels., and measures about 1.0 cm in diameter. Lymph nodes anterior to the left psoas muscle have maximum SUV of 2.0, similar to blood pool. Overall these lymph nodes are borderline enlarged and with activity level at or just minimally above blood pool. While certainly not grossly positive, their proximity to the pancreatic head mass and borderline imaging characteristics make it difficult to exclude early nodal involvement by malignancy.   03/24/2020 Surgery   Whipple surgery, Laparoscopic Diagnostic, Appendectomy by Dr Barry Dienes    03/24/2020 Pathology Results   FINAL MICROSCOPIC DIAGNOSIS:   A. WHIPPLE PROCEDURE, WITH GALLBLADDER:  - Adenocarcinoma, moderately differentiated, spanning 1 cm.  - Tumor involves retroperitoneal peripancreatic soft  tissue.  - Resection margins are negative.  - Treatment effect  present.  - Rare tumor cells in one of fourteen lymph nodes (1/14).  - Benign gallbladder with chronic inflammation and benign lymph node.  - Bile duct with granulation tissue and reactive changes.  - Benign appendix.  - Mild reactive gastropathy.  - See oncology table.   B. LYMPH NODE, PORTAL, EXCISION:  - Three of three lymph nodes negative for carcinoma (0/3).   C. LYMPH NODE, COMMON HEPATIC ARTERY, EXCISION:  - Three of three lymph nodes negative for carcinoma (0/3).   D. APPENDIX, APPENDECTOMY:  - Benign appendix.   E. LYMPH NODE, AORTOCAVAL, EXCISION:  - One of one lymph node negative for carcinoma (0/1).    03/24/2020 Cancer Staging   Staging form: Exocrine Pancreas, AJCC 8th Edition - Pathologic stage from 03/24/2020: Stage IIB (pT1c, pN1, cM0) - Signed by Truitt Merle, MD on 04/16/2020    04/14/2020 Imaging   CT AP  IMPRESSION: 1. Interval Whipple procedure. Pancreatic stent in place. 2. Ill-defined soft tissue in the right abdominal wall extending from the subcutaneous tissues to the anterior abdominal wall musculature, but no focal fluid collection. This may represent postsurgical change, sterility indeterminate by imaging. 3. Pelvic anatomy is poorly defined, including pelvic bowel loops, adnexa, and colon. There appears to be sigmoid colonic wall thickening as well as intraluminal fluid, with additional diffuse colonic wall thickening from the transverse colon distally. Findings are suggestive of generalized colitis. Attention to this area on follow-up is recommended. 4. Small amount of non organized free fluid in the right upper quadrant. No evidence of intra-abdominal abscess. 5. Periportal edema, nonspecific. Previous biliary stent has been removed. Common bile duct is not well-defined on the current exam. 6. Mild splenomegaly. 7. Trace right pleural effusion.   05/19/2020 - 08/04/2020 Chemotherapy   Adjuvant Gemcitabine 2 weeks on 1 week off starting 05/19/20  and Xeloda started on 05/25/2020. Xeloda dose currently at 1535m in the AM and 10062min the PM. Completed 08/04/20.    08/18/2020 Imaging   CT CAP  IMPRESSION: 1. Interval improvement in previously demonstrated ascites and generalized mesenteric edema. No peritoneal nodularity or focal extraluminal fluid collection identified. 2. Stable postsurgical changes from previous Whipple procedure. No evidence of local recurrence or metastatic disease. 3. Pancreatic stent remains in place without pancreatic ductal dilatation. Although there is no pneumobilia, there is no significant biliary dilatation. 4. Stable mild splenomegaly. 5. No significant findings in the chest.     CURRENT THERAPY: Surveillance   INTERVAL HISTORY: Ms. BrWhitelockresents for phone f/up to discuss CT scan. She identifies herself x2. She feels great, appetite is "tremendous" and she has good energy. Able to keep 2 grandchildren. Denies noticeable weight loss. Recently she noticed lighter stool and mild skin itching, but "not like before." Denies yellow eyes, dark urine, n/v, fever, or chills.    MEDICAL HISTORY:  Past Medical History:  Diagnosis Date   Cataracts, bilateral    Diverticulosis 12/07/2001   found on colonoscopy   Family history of colon cancer    Family history of pancreatic cancer    Hypothyroid 10/2007   Macular hole    pancreatic ca 08/2019   pancreatic   Pancreatic mass    PONV (postoperative nausea and vomiting)     SURGICAL HISTORY: Past Surgical History:  Procedure Laterality Date   APPENDECTOMY N/A 03/24/2020   Procedure: APPENDECTOMY;  Surgeon: ByStark KleinMD;  [place] Service: General;  Laterality:  N/A;   BILIARY STENT PLACEMENT  10/10/2019   Procedure: BILIARY STENT PLACEMENT;  Surgeon: Arta Silence, MD;  Location: [place];  Service: Endoscopy;;   CATARACT EXTRACTION, BILATERAL     COLONOSCOPY     ERCP N/A 10/10/2019   Procedure: ENDOSCOPIC RETROGRADE  CHOLANGIOPANCREATOGRAPHY (ERCP);  Surgeon: Arta Silence, MD;  Location: [facility] ENDOSCOPY;  Service: Endoscopy;  Laterality: N/A;   ESOPHAGOGASTRODUODENOSCOPY N/A 10/10/2019   Procedure: ESOPHAGOGASTRODUODENOSCOPY (EGD);  Surgeon: Arta Silence, MD;  Location: [facility] ENDOSCOPY;  Service: Endoscopy;  Laterality: N/A;   EUS N/A 10/10/2019   Procedure: UPPER ENDOSCOPIC ULTRASOUND (EUS) LINEAR;  Surgeon: Arta Silence, MD;  Location: [place];  Service: Endoscopy;  Laterality: N/A;   EYE SURGERY     FINE NEEDLE ASPIRATION  10/10/2019   Procedure: FINE NEEDLE ASPIRATION (FNA) LINEAR;  Surgeon: Arta Silence, MD;  Location: [place] ENDOSCOPY;  Service: Endoscopy;;   IR IMAGING GUIDED PORT INSERTION  10/25/2019   LAPAROSCOPY N/A 03/24/2020   Procedure: LAPAROSCOPY DIAGNOSTIC;  Surgeon: Stark Klein, MD;  Location: [place];  Service: General;  Laterality: N/A;   Macular hole surg     PANCREATIC STENT PLACEMENT  10/10/2019   Procedure: PANCREATIC STENT PLACEMENT;  Surgeon: Arta Silence, MD;  Location: [facility] ENDOSCOPY;  Service: Endoscopy;;   SPHINCTEROTOMY  10/10/2019   Procedure: Joan Mayans;  Surgeon: Arta Silence, MD;  Location: [place];  Service: Endoscopy;;   TONSILLECTOMY     TONSILLECTOMY     WHIPPLE PROCEDURE N/A 03/24/2020   Procedure: WHIPPLE PROCEDURE;  Surgeon: Stark Klein, MD;  Location: [place];  Service: General;  Laterality: N/A;    I have reviewed the social history and family history with the patient and they are unchanged from previous note.  ALLERGIES:  has No Known Allergies.  MEDICATIONS:  Current Outpatient Medications  Medication Sig Dispense Refill   Ascorbic Acid (VITAMIN C PO) Take 1,000 mg by mouth daily.      B Complex Vitamins (B-COMPLEX/B-12 PO) Take by mouth.     Calcium Carb-Cholecalciferol (CALCIUM 1000 + D PO) Take 1 tablet by mouth daily.      ferrous sulfate 325 (65 FE) MG tablet Take 325 mg by mouth daily with breakfast.     ibuprofen (ADVIL) 200 MG  tablet Take 400 mg by mouth every 6 (six) hours as needed for moderate pain.     levothyroxine (SYNTHROID, LEVOTHROID) 50 MCG tablet Take 1 tablet (50 mcg total) by mouth daily. 90 tablet 4   Multiple Vitamin (MULTIVITAMIN PO) Take 1 tablet by mouth daily.     vitamin B-12 (CYANOCOBALAMIN) 1000 MCG tablet Take 1,000 mcg by mouth daily.     No current facility-administered medications for this visit.    PHYSICAL EXAMINATION: ECOG PERFORMANCE STATUS: 0 - Asymptomatic  There were no vitals filed for this visit. There were no vitals filed for this visit.  Patient appears well over the phone. Strong voice, clear fluent speech. Mood/affect appear appropriate. No cough or conversational dyspnea.   LABORATORY DATA:  I have reviewed the data as listed CBC Latest Ref Rng & Units 11/19/2020 10/16/2020 08/18/2020  WBC 4.0 - 10.5 K/uL 5.2 4.7 4.4  Hemoglobin 12.0 - 15.0 g/dL 12.0 11.8(L) 11.4(L)  Hematocrit 36.0 - 46.0 % 36.3 36.3 35.5(L)  Platelets 150 - 400 K/uL 143(L) 140(L) 177     CMP Latest Ref Rng & Units 11/19/2020 10/16/2020 08/18/2020  Glucose 70 - 99 mg/dL 99 115(H) 102(H)  BUN 6 - 20 mg/dL 8 6 5(L)  Creatinine 0.44 - 1.00 mg/dL 0.52 0.61 0.59  Sodium 135 - 145 mmol/L 138 139 138  Potassium 3.5 - 5.1 mmol/L 4.0 4.2 3.9  Chloride 98 - 111 mmol/L 103 103 103  CO2 22 - 32 mmol/L _0 Calcium 8.9 - 10.3 mg/dL 8.9 9.1 9.0  Total Protein 6.5 - 8.1 g/dL 6.8 6.9 7.1  Total Bilirubin 0.3 - 1.2 mg/dL 0.5 0.5 0.5  Alkaline Phos 38 - 126 U/L 107 112 129(H)  AST 15 - 41 U/L 36 33 40  ALT 0 - 44 U/L 34 33 29      RADIOGRAPHIC STUDIES: I have personally reviewed the radiological images as listed and agreed with the findings in the report. CT CHEST ABDOMEN PELVIS W CONTRAST  Result Date: 12/08/2020 CLINICAL DATA:  Pancreatic cancer, rising tumor marker, rule out malignant recurrence or metastatic disease EXAM: CT CHEST, ABDOMEN, AND PELVIS WITH CONTRAST TECHNIQUE: Multidetector CT imaging  of the chest, abdomen and pelvis was performed following the standard protocol during bolus administration of intravenous contrast. CONTRAST:  40m OMNIPAQUE IOHEXOL 350 MG/ML SOLN, additional oral enteric contrast COMPARISON:  08/18/2020 FINDINGS: CT CHEST FINDINGS Cardiovascular: Right chest port catheter. Normal heart size. Left and right coronary artery calcifications. No pericardial effusion. Mediastinum/Nodes: No enlarged mediastinal, hilar, or axillary lymph nodes. Thyroid gland, trachea, and esophagus demonstrate no significant findings. Lungs/Pleura: Lungs are clear. No pleural effusion or pneumothorax. Musculoskeletal: No chest wall mass or suspicious bone lesions identified. CT ABDOMEN PELVIS FINDINGS Hepatobiliary: No focal liver abnormality is seen. Status post cholecystectomy and hepatojejunostomy. No biliary dilatation. Postoperative pneumobilia. Pancreas: Status post Whipple pancreaticoduodenectomy and pancreaticojejunostomy. Main pancreatic duct stent remains within the pancreatic duct but advanced several centimeters into the jejunal efferent limb. There is new, diffuse pancreatic ductal dilatation, measuring up to 6 mm. Spleen: Normal in size without significant abnormality. Adrenals/Urinary Tract: Adrenal glands are unremarkable. Kidneys are normal, without renal calculi, solid lesion, or hydronephrosis. Bladder is unremarkable. Stomach/Bowel: Status post distal gastrectomy and gastrojejunostomy. Appendix is surgically absent. No evidence of bowel wall thickening, distention, or inflammatory changes. Large burden of stool throughout the colon and rectum. Vascular/Lymphatic: No significant vascular findings are present. There are numerous newly enlarged, matted appearing retroperitoneal lymph nodes, largest left retroperitoneal nodes measuring up to 1.9 x 1.2 cm (series 7, image 123). Reproductive: No mass or other abnormality. Other: No abdominal wall hernia or abnormality. Small volume  perihepatic ascites, unchanged. Musculoskeletal: No acute or significant osseous findings. IMPRESSION: 1. Status post Whipple pancreaticoduodenectomy. 2. There are numerous newly enlarged, matted appearing retroperitoneal lymph nodes, largest left retroperitoneal nodes measuring up to 1.9 x 1.2 cm. Findings are highly concerning for nodal metastatic disease. 3. Main pancreatic duct stent remains within the pancreatic duct but advanced several centimeters into the jejunal efferent limb when compared to prior examination. There is new, diffuse pancreatic ductal dilatation, measuring up to 6 mm. 4. No evidence of organ metastatic disease or distant metastatic disease in the chest. 5. Small volume perihepatic ascites, unchanged, suspicious for malignant ascites, although without direct evidence of omental or peritoneal metastatic disease. 6. Coronary artery disease. These results will be called to the ordering clinician or representative by the Radiologist Assistant, and communication documented in the PACS or [employer] Electronically Signed   By: AEddie CandleM.D.   On: 12/08/2020 16:10     ASSESSMENT & PLAN: LCARLYN LEMKEis a 60y.o. caucasian female with a history of Cataracts, Diverticulosis, Hypothyroidism   1.  Pancreatic Cancer, Stage IIB, cT2N1M0, ypT1N1 -Diagnosed 10/2019.  Her US showed bile duct and gallbladder dilation due to 2-3cm mass at head of pancreas/Uncinate. Her MRI showed her mass is laying close to SMV but not involving it. There is no evidence of lymphadenopathy or metastatic disease in the abdomen on MRI. -She underwent EUS and ERCP on 10/10/19 and had 2 stents placed by Dr Paulita Fujita. Her pancreatic mass biopsy showed Adenocarcinoma of Pancreas. Endoscopic staging T2N1, no evidence of vascular involvement. -Given her young age and good performance status she completed 8 cycles of neoadjuvant FOLFIRINOX from 11/09/2019 - 02/20/2020, tolerated moderately well  -S/p Whipple surgery with Dr.  Barry Dienes on 03/24/2020, path showed a residual 1 cm moderately differentiated adenocarcinoma involving RP peripancreatic soft tissue and rare tumor cells in 1/14 positive lymph nodes, margins negative -She completed 3 months adjuvant chemo with gemcitabine/Xeloda 05/19/20 - 08/04/20 then went on surveillance  -CA 19-9 normalized from Feb - March 2022 on adjuvant chemo, slowly increased to 43 on 10/16/20 and now 58 on 11/19/20 during surveillance. Clinically doing well with early signs of juandice but so clinical signs of recurrence  -CT CAP 12/08/20 concerning for enlarged RP adenopathy and new pancreatic ductal dilatation. PET scan recommended for further eval   2. Upper Abdominal Pain, Mild weight loss -Presented with initial jaundice but since stent placement her pain has much improved.  -Pain resolved on chemo -post-op pain is more cramping, especially if she overeats. Worsened by creon, stopped after 2 doses -f/up dietician. Encouraged to start supplements.  -pain resolved and appetite is normal after completing adjuvant chemo on surveillance    3. Genetic Testing -negative result 11/28/19  Disposition:  Ms. Hove is clinically doing well without obvious signs of recurrent/metastatic disease. Due to rising CA 19-9 to 58 (>800 at diagnosis 2021) a CT CAP was obtained 12/08/20 which is concerning for enlarged retroperitoneal adenopathy and pancreatic ductal dilatation. I reviewed with the patient today. She has early clinical signs of juandice including light stool and mild skin itching. Last CMP was normal 7/21, will repeat.   I reviewed her scan with IR Dr. Anselm Pancoast who did not feel her adenopathy was significantly changed from prior scan 07/2020, and with Dr. Burr Medico. The recommendation is to proceed with PET scan to r/o nodal recurrence/metastasis. We discussed tumor markers can be elevated with stent malfunction, and infectious/inflammatory adenopathy as well as metastatic disease can be PET avid, and a biopsy  might be recommended pending results. She understands and agrees to proceed.   PET also noted coronary artery disease, she has not had lipids checked in 1-2 years due to the pandemic and her cancer treatment. I will cc my note to PCP for risk stratification.   We will review her case in next tumor board and f/up after work up, tentative 8/17.   Orders Placed This Encounter  Procedures   NM PET Image Restag (PS) Skull Base To Thigh    Standing Status:   Future    Standing Expiration Date:   12/09/2021    Order Specific Question:   If indicated for the ordered procedure, I authorize the administration of a radiopharmaceutical per Radiology protocol    Answer:   Yes    Order Specific Question:   Is the patient pregnant?    Answer:   No    Order Specific Question:   Preferred imaging location?    Answer:   [facility]    I discussed the assessment and treatment plan  with the patient. The patient was provided an opportunity to ask questions and all were answered. The patient agreed with the plan and demonstrated an understanding of the instructions.   The patient was advised to call back or seek an in-person evaluation if the symptoms worsen or if the condition fails to improve as anticipated. Total encounter time was 15 minutes.      Alla Feeling, NP 12/09/20

## 2020-12-10 ENCOUNTER — Inpatient Hospital Stay: Payer: BC Managed Care – PPO | Admitting: Hematology

## 2020-12-10 ENCOUNTER — Telehealth: Payer: Self-pay | Admitting: Hematology

## 2020-12-10 NOTE — Telephone Encounter (Signed)
Scheduled follow-up appointments per 8/10 los. Patient is aware. 

## 2020-12-14 ENCOUNTER — Telehealth: Payer: Self-pay | Admitting: Hematology

## 2020-12-14 NOTE — Telephone Encounter (Signed)
Scheduled appt per 8/14 sch msg. Pt aware.

## 2020-12-16 ENCOUNTER — Inpatient Hospital Stay: Payer: BC Managed Care – PPO

## 2020-12-16 ENCOUNTER — Inpatient Hospital Stay: Payer: BC Managed Care – PPO | Admitting: Hematology

## 2020-12-22 ENCOUNTER — Ambulatory Visit (HOSPITAL_COMMUNITY)
Admission: RE | Admit: 2020-12-22 | Discharge: 2020-12-22 | Disposition: A | Discharge status: Home / Self Care | Payer: BC Managed Care – PPO | Source: Ambulatory Visit | Attending: Nurse Practitioner | Admitting: Nurse Practitioner

## 2020-12-22 ENCOUNTER — Other Ambulatory Visit: Payer: Self-pay

## 2020-12-22 ENCOUNTER — Other Ambulatory Visit: Payer: Self-pay | Admitting: Hematology

## 2020-12-22 ENCOUNTER — Inpatient Hospital Stay: Payer: BC Managed Care – PPO

## 2020-12-22 DIAGNOSIS — Z9889 Other specified postprocedural states: Secondary | ICD-10-CM | POA: Diagnosis not present

## 2020-12-22 DIAGNOSIS — Z95828 Presence of other vascular implants and grafts: Secondary | ICD-10-CM

## 2020-12-22 DIAGNOSIS — Z452 Encounter for adjustment and management of vascular access device: Secondary | ICD-10-CM | POA: Diagnosis not present

## 2020-12-22 DIAGNOSIS — C250 Malignant neoplasm of head of pancreas: Secondary | ICD-10-CM | POA: Diagnosis not present

## 2020-12-22 DIAGNOSIS — C259 Malignant neoplasm of pancreas, unspecified: Secondary | ICD-10-CM | POA: Diagnosis not present

## 2020-12-22 DIAGNOSIS — K760 Fatty (change of) liver, not elsewhere classified: Secondary | ICD-10-CM | POA: Diagnosis not present

## 2020-12-22 DIAGNOSIS — Z8507 Personal history of malignant neoplasm of pancreas: Secondary | ICD-10-CM | POA: Diagnosis not present

## 2020-12-22 LAB — CBC WITH DIFFERENTIAL (CANCER CENTER ONLY)
Abs Immature Granulocytes: 0.01 10*3/uL (ref 0.00–0.07)
Basophils Absolute: 0 10*3/uL (ref 0.0–0.1)
Basophils Relative: 0 %
Eosinophils Absolute: 0 10*3/uL (ref 0.0–0.5)
Eosinophils Relative: 1 %
HCT: 38.1 % (ref 36.0–46.0)
Hemoglobin: 12.7 g/dL (ref 12.0–15.0)
Immature Granulocytes: 0 %
Lymphocytes Relative: 35 %
Lymphs Abs: 1.8 10*3/uL (ref 0.7–4.0)
MCH: 29.7 pg (ref 26.0–34.0)
MCHC: 33.3 g/dL (ref 30.0–36.0)
MCV: 89.2 fL (ref 80.0–100.0)
Monocytes Absolute: 0.6 10*3/uL (ref 0.1–1.0)
Monocytes Relative: 12 %
Neutro Abs: 2.6 10*3/uL (ref 1.7–7.7)
Neutrophils Relative %: 52 %
Platelet Count: 121 10*3/uL — ABNORMAL LOW (ref 150–400)
RBC: 4.27 MIL/uL (ref 3.87–5.11)
RDW: 14.5 % (ref 11.5–15.5)
WBC Count: 5.1 10*3/uL (ref 4.0–10.5)
nRBC: 0 % (ref 0.0–0.2)

## 2020-12-22 LAB — CMP (CANCER CENTER ONLY)
ALT: 36 U/L (ref 0–44)
AST: 40 U/L (ref 15–41)
Albumin: 4.1 g/dL (ref 3.5–5.0)
Alkaline Phosphatase: 109 U/L (ref 38–126)
Anion gap: 9 (ref 5–15)
BUN: 5 mg/dL — ABNORMAL LOW (ref 6–20)
CO2: 28 mmol/L (ref 22–32)
Calcium: 9.3 mg/dL (ref 8.9–10.3)
Chloride: 104 mmol/L (ref 98–111)
Creatinine: 0.67 mg/dL (ref 0.44–1.00)
GFR, Estimated: >60 mL/min (ref >60)
Glucose, Bld: 102 mg/dL — ABNORMAL HIGH (ref 70–99)
Potassium: 3.8 mmol/L (ref 3.5–5.1)
Sodium: 141 mmol/L (ref 135–145)
Total Bilirubin: 0.5 mg/dL (ref 0.3–1.2)
Total Protein: 7 g/dL (ref 6.5–8.1)

## 2020-12-22 LAB — GLUCOSE, CAPILLARY: Glucose-Capillary: 103 mg/dL — ABNORMAL HIGH (ref 70–99)

## 2020-12-22 MED ORDER — FLUDEOXYGLUCOSE F - 18 (FDG) INJECTION
5.9000 | Freq: Once | INTRAVENOUS | Status: AC
  Administered 2020-12-22: 5.7 via INTRAVENOUS

## 2020-12-22 MED ORDER — HEPARIN SOD (PORK) LOCK FLUSH 100 UNIT/ML IV SOLN
500.0000 [IU] | Freq: Once | INTRAVENOUS | Status: AC
  Administered 2020-12-22: 500 [IU]

## 2020-12-22 MED ORDER — SODIUM CHLORIDE 0.9% FLUSH
10.0000 mL | Freq: Once | INTRAVENOUS | Status: AC
  Administered 2020-12-22: 10 mL

## 2020-12-23 ENCOUNTER — Inpatient Hospital Stay (HOSPITAL_BASED_OUTPATIENT_CLINIC_OR_DEPARTMENT_OTHER): Payer: BC Managed Care – PPO | Admitting: Hematology

## 2020-12-23 ENCOUNTER — Other Ambulatory Visit: Payer: Self-pay

## 2020-12-23 ENCOUNTER — Encounter: Payer: Self-pay | Admitting: Hematology

## 2020-12-23 VITALS — BP 128/64 | HR 83 | Temp 98.3°F | Resp 18 | Ht 65.0 in | Wt 116.8 lb

## 2020-12-23 DIAGNOSIS — Z452 Encounter for adjustment and management of vascular access device: Secondary | ICD-10-CM | POA: Diagnosis not present

## 2020-12-23 DIAGNOSIS — Z8507 Personal history of malignant neoplasm of pancreas: Secondary | ICD-10-CM | POA: Diagnosis not present

## 2020-12-23 DIAGNOSIS — C250 Malignant neoplasm of head of pancreas: Secondary | ICD-10-CM

## 2020-12-23 NOTE — Progress Notes (Signed)
The proposed treatment discussed in conference is for discussion purpose only and is not a binding recommendation.  The patients have not been physically examined, or presented with their treatment options.  Therefore, final treatment plans cannot be decided.  

## 2020-12-23 NOTE — Progress Notes (Signed)
[name]   [phone] [phone]   Clinic Follow up Note   Patient Care Team: Vernie Shanks, MD as PCP - General (Family Medicine) Fontaine, Belinda Block, MD (Inactive) as Consulting Physician (Gynecology) Truitt Merle, MD as Consulting Physician (Oncology) Arta Silence, MD as Consulting Physician (Gastroenterology) Stark Klein, MD as Consulting Physician (General Surgery) Karie Mainland, RD as Dietitian (Nutrition)  Date of Service:  12/23/2020  CHIEF COMPLAINT: f/u of pancreatic cancer  SUMMARY OF ONCOLOGIC HISTORY: Oncology History Overview Note  Cancer Staging Pancreatic cancer [facility]) Staging form: Exocrine Pancreas, AJCC 8th Edition - Clinical stage from 10/16/2019: Stage IIB (cT2, cN1, cM0) - Signed by Truitt Merle, MD on 10/16/2019    Pancreatic cancer ([place])  10/03/2019 Imaging   US Abdomen 10/03/19  IMPRESSION: Markedly dilated bile ducts. Dilated gallbladder with sludge but no gallstones.   2.9 cm mass in the uncinate process most likely pancreatic neoplasm causing biliary obstruction. Recommend MRI of the pancreas without with contrast for further evaluation. If the patient cannot have MRI, CT of the pancreas with contrast recommended.   10/07/2019 Imaging   MRI Abdomen 10/07/19  IMPRESSION: 1. There is a hypoenhancing mass of the pancreatic uncinate with abrupt obstruction of the central common bile duct, measuring approximately 2.9 cm, consistent with pancreatic adenocarcinoma. 2. Gross intra and extrahepatic biliary ductal dilatation, common bile duct measuring 1.4 cm. Gallbladder hydrops. 3. The pancreatic duct is nondilated. 4. Pancreatic mass lies closely adjacent to the most central portions of the superior mesenteric vein and central portal vein, due to motion artifact it is difficult to clearly discern whether there is a preserved fat plane. Multiphasic contrast enhanced pancreatic protocol CT may be less motion sensitive  and better delineate vascular structures for the purposes of surgical planning if necessary. 5. No evidence of lymphadenopathy or metastatic disease in the abdomen.   10/10/2019 Procedure   EUS by Dr Paulita Fujita 10/10/19  IMPRESSION - A mass was identified at junction of head/uncinate process of the pancreas. This was staged T2 N1 Mx by endosonographic criteria. Fine needle aspiration performed. - A few abnormal lymph nodes were visualized in the peripancreatic region. - There was dilation in the common bile duct which measured up to 14 mm. - Hyperechoic material consistent with sludge was visualized endosonographically in the gallbladder.   10/10/2019 Procedure   ERCP by Dr Paulita Fujita 10/10/19  IMPRESSION - The major papilla appeared normal. - One temporary stent was placed into the ventral pancreatic duct. - A biliary sphincterotomy was performed. - One covered metal stent was placed into the common bile duct.   10/10/2019 Initial Biopsy   FINAL MICROSCOPIC DIAGNOSIS: 10/10/19  A. PANCREAS, HEAD, FINE NEEDLE ASPIRATION:  - Malignant cells present, consistent with adenocarcinoma    10/16/2019 Initial Diagnosis   Pancreatic cancer ([place])   10/16/2019 Cancer Staging   Staging form: Exocrine Pancreas, AJCC 8th Edition - Clinical stage from 10/16/2019: Stage IIB (cT2, cN1, cM0) - Signed by Truitt Merle, MD on 10/16/2019   10/23/2019 Imaging   CT CAP W contrast  IMPRESSION: 1. Pancreatic mass with local adenopathy and potential retroperitoneal adenopathy. Given retroperitoneal lymph nodes and presence of venous collaterals PET scan may be useful for staging purposes. 2. No definite vascular involvement or signs of upper abdominal venous collaterals. Some stranding around the celiac may be due to recent biopsy. 3. Borderline enlarged RIGHT external iliac lymph node. Attention on follow-up. 4. No evidence of metastatic disease in the chest. 5.  Signs of recent ERCP and stent placement.    10/25/2019 Procedure   PAC placed    11/09/2019 - 02/20/2020 Chemotherapy   Neoadjuvant FOLFIRINOX q2weeks starting 11/09/19-02/20/20 for 8 cycles   11/28/2019 Genetic Testing   Negative genetic testing on the common hereditary cancer panel, pancreatic cancer panel, preliminary evidence pancreatic cancer panel and chronic pancreatitis panel.  The Common Hereditary Gene Panel offered by [facility] includes sequencing and/or deletion duplication testing of the following 55 genes: APC*, ATM*, AXIN2, BARD1, BMPR1A, BRCA1, BRCA2, BRIP1, CASR, CDH1, CDK4, CDKN2A (p14ARF), CDKN2A (p16INK4a), CFTR*, CHEK2, CPA1, CTNNA1, CTRC, DICER1*, EPCAM*, FANCC, GREM1*, HOXB13, KIT, MEN1*, MLH1*, MSH2*, MSH3*, MSH6*, MUTYH, NBN, NF1*, NTHL1, PALB2, PALLD, PDGFRA, PMS2*, POLD1*, POLE, PRSS1*, PTEN*, RAD50, RAD51C, RAD51D, SDHA*, SDHB, SDHC*, SDHD, SMAD4, SMARCA4, SPINK1, STK11, TP53, TSC1*, TSC2, and VHL .  The report date is November 28, 2019.   01/28/2020 Imaging   CT AP w contrast  IMPRESSION: 1. No substantial interval change in exam. 2. Similar appearance of the pancreatic mass with associated main pancreatic ductal dilatation. 3. Soft tissue stranding again noted around the celiac axis, in the para-aortic retroperitoneal space and along the IMA (new in the interval). These findings are associated with similar appearance of upper normal to borderline hepatoduodenal ligament and retroperitoneal lymphadenopathy. Close continued attention on follow-up recommended. 4. Borderline enlarged right external iliac node identified on previous study has decreased in size in the interval.   02/14/2020 PET scan   IMPRESSION: 1. The pancreatic mass has a maximum SUV of 6.1. No findings of hypermetabolic local adenopathy or distant metastatic spread. 2. Splenomegaly. 3. Inspissated barium or appendicolith within the appendix. No signs of appendiceal inflammation. 4. Scattered sigmoid colon diverticula. ADDENDUM: The original  report was by Dr. Van Clines. The following addendum is by Dr. Van Clines:   On the CT from 01/28/2020 there were some upper normal sized/borderline retroperitoneal lymph nodes including a 0.8 cm aortocaval node on image 72 of series 9. This currently has a maximum SUV of 2.2, which is similar to blood pool activity. An indistinctly marginated lymph node just below the left renal vein has a maximum SUV of 2.4, just above blood pool levels., and measures about 1.0 cm in diameter. Lymph nodes anterior to the left psoas muscle have maximum SUV of 2.0, similar to blood pool. Overall these lymph nodes are borderline enlarged and with activity level at or just minimally above blood pool. While certainly not grossly positive, their proximity to the pancreatic head mass and borderline imaging characteristics make it difficult to exclude early nodal involvement by malignancy.   03/24/2020 Surgery   Whipple surgery, Laparoscopic Diagnostic, Appendectomy by Dr Barry Dienes    03/24/2020 Pathology Results   FINAL MICROSCOPIC DIAGNOSIS:   A. WHIPPLE PROCEDURE, WITH GALLBLADDER:  - Adenocarcinoma, moderately differentiated, spanning 1 cm.  - Tumor involves retroperitoneal peripancreatic soft tissue.  - Resection margins are negative.  - Treatment effect present.  - Rare tumor cells in one of fourteen lymph nodes (1/14).  - Benign gallbladder with chronic inflammation and benign lymph node.  - Bile duct with granulation tissue and reactive changes.  - Benign appendix.  - Mild reactive gastropathy.  - See oncology table.   B. LYMPH NODE, PORTAL, EXCISION:  - Three of three lymph nodes negative for carcinoma (0/3).   C. LYMPH NODE, COMMON HEPATIC ARTERY, EXCISION:  - Three of three lymph nodes negative for carcinoma (0/3).   D. APPENDIX, APPENDECTOMY:  - Benign appendix.   E.  LYMPH NODE, AORTOCAVAL, EXCISION:  - One of one lymph node negative for carcinoma (0/1).    03/24/2020  Cancer Staging   Staging form: Exocrine Pancreas, AJCC 8th Edition - Pathologic stage from 03/24/2020: Stage IIB (pT1c, pN1, cM0) - Signed by Truitt Merle, MD on 04/16/2020   04/14/2020 Imaging   CT AP  IMPRESSION: 1. Interval Whipple procedure. Pancreatic stent in place. 2. Ill-defined soft tissue in the right abdominal wall extending from the subcutaneous tissues to the anterior abdominal wall musculature, but no focal fluid collection. This may represent postsurgical change, sterility indeterminate by imaging. 3. Pelvic anatomy is poorly defined, including pelvic bowel loops, adnexa, and colon. There appears to be sigmoid colonic wall thickening as well as intraluminal fluid, with additional diffuse colonic wall thickening from the transverse colon distally. Findings are suggestive of generalized colitis. Attention to this area on follow-up is recommended. 4. Small amount of non organized free fluid in the right upper quadrant. No evidence of intra-abdominal abscess. 5. Periportal edema, nonspecific. Previous biliary stent has been removed. Common bile duct is not well-defined on the current exam. 6. Mild splenomegaly. 7. Trace right pleural effusion.   05/19/2020 - 08/04/2020 Chemotherapy   Adjuvant Gemcitabine 2 weeks on 1 week off starting 05/19/20 and Xeloda started on 05/25/2020. Xeloda dose currently at 156m in the AM and 10067min the PM. Completed 08/04/20.    08/18/2020 Imaging   CT CAP  IMPRESSION: 1. Interval improvement in previously demonstrated ascites and generalized mesenteric edema. No peritoneal nodularity or focal extraluminal fluid collection identified. 2. Stable postsurgical changes from previous Whipple procedure. No evidence of local recurrence or metastatic disease. 3. Pancreatic stent remains in place without pancreatic ductal dilatation. Although there is no pneumobilia, there is no significant biliary dilatation. 4. Stable mild splenomegaly. 5. No  significant findings in the chest.   12/08/2020 Imaging   CT CAP  IMPRESSION: 1. Status post Whipple pancreaticoduodenectomy. 2. There are numerous newly enlarged, matted appearing retroperitoneal lymph nodes, largest left retroperitoneal nodes measuring up to 1.9 x 1.2 cm. Findings are highly concerning for nodal metastatic disease. 3. Main pancreatic duct stent remains within the pancreatic duct but advanced several centimeters into the jejunal efferent limb when compared to prior examination. There is new, diffuse pancreatic ductal dilatation, measuring up to 6 mm. 4. No evidence of organ metastatic disease or distant metastatic disease in the chest. 5. Small volume perihepatic ascites, unchanged, suspicious for malignant ascites, although without direct evidence of omental or peritoneal metastatic disease. 6. Coronary artery disease.   12/22/2020 PET scan   IMPRESSION: No evidence of recurrent or metastatic carcinoma.      CURRENT THERAPY:  Surveillance  INTERVAL HISTORY:  Breanna WEATHERHOLTZs here for a follow up of pancreatic cancer. She was last seen by me on 10/16/20 and by NP Lacie in the interim. She presents to the clinic accompanied by her husband.  She is clinically doing very well, denies any abdominal bloating, nausea, or other GI symptoms.  She is eating well, weight is stable.  Her energy level is also good, she functions normally at home.   All other systems were reviewed with the patient and are negative.  MEDICAL HISTORY:  Past Medical History:  Diagnosis Date   Cataracts, bilateral    Diverticulosis 12/07/2001   found on colonoscopy   Family history of colon cancer    Family history of pancreatic cancer    Hypothyroid 10/2007   Macular hole    pancreatic  ca 08/2019   pancreatic   Pancreatic mass    PONV (postoperative nausea and vomiting)     SURGICAL HISTORY: Past Surgical History:  Procedure Laterality Date   APPENDECTOMY N/A 03/24/2020   Procedure:  APPENDECTOMY;  Surgeon: Stark Klein, MD;  Location: [place];  Service: General;  Laterality: N/A;   BILIARY STENT PLACEMENT  10/10/2019   Procedure: BILIARY STENT PLACEMENT;  Surgeon: Arta Silence, MD;  Location: [place];  Service: Endoscopy;;   CATARACT EXTRACTION, BILATERAL     COLONOSCOPY     ERCP N/A 10/10/2019   Procedure: ENDOSCOPIC RETROGRADE CHOLANGIOPANCREATOGRAPHY (ERCP);  Surgeon: Arta Silence, MD;  Location[facility] ENDOSCOPY;  Service: Endoscopy;  Laterality: N/A;   ESOPHAGOGASTRODUODENOSCOPY N/A 10/10/2019   Procedure: ESOPHAGOGASTRODUODENOSCOPY (EGD);  Surgeon: Arta Silence, MD;  Location: [facility] ENDOSCOPY;  Service: Endoscopy;  Laterality: N/A;   EUS N/A 10/10/2019   Procedure: UPPER ENDOSCOPIC ULTRASOUND (EUS) LINEAR;  Surgeon: Arta Silence, MD;  Location: [place];  Service: Endoscopy;  Laterality: N/A;   EYE SURGERY     FINE NEEDLE ASPIRATION  10/10/2019   Procedure: FINE NEEDLE ASPIRATION (FNA) LINEAR;  Surgeon: Arta Silence, MD;  Location: [place] ENDOSCOPY;  Service: Endoscopy;;   IR IMAGING GUIDED PORT INSERTION  10/25/2019   LAPAROSCOPY N/A 03/24/2020   Procedure: LAPAROSCOPY DIAGNOSTIC;  Surgeon: Stark Klein, MD;  Location: [place];  Service: General;  Laterality: N/A;   Macular hole surg     PANCREATIC STENT PLACEMENT  10/10/2019   Procedure: PANCREATIC STENT PLACEMENT;  Surgeon: Arta Silence, MD;  Location: [facility] ENDOSCOPY;  Service: Endoscopy;;   SPHINCTEROTOMY  10/10/2019   Procedure: Joan Mayans;  Surgeon: Arta Silence, MD;  Location: [place];  Service: Endoscopy;;   TONSILLECTOMY     TONSILLECTOMY     WHIPPLE PROCEDURE N/A 03/24/2020   Procedure: WHIPPLE PROCEDURE;  Surgeon: Stark Klein, MD;  Location: [place];  Service: General;  Laterality: N/A;    I have reviewed the social history and family history with the patient and they are unchanged from previous note.  ALLERGIES:  has No Known Allergies.  MEDICATIONS:  Current Outpatient  Medications  Medication Sig Dispense Refill   Ascorbic Acid (VITAMIN C PO) Take 1,000 mg by mouth daily.      B Complex Vitamins (B-COMPLEX/B-12 PO) Take by mouth.     Calcium Carb-Cholecalciferol (CALCIUM 1000 + D PO) Take 1 tablet by mouth daily.      ferrous sulfate 325 (65 FE) MG tablet Take 325 mg by mouth daily with breakfast.     ibuprofen (ADVIL) 200 MG tablet Take 400 mg by mouth every 6 (six) hours as needed for moderate pain.     levothyroxine (SYNTHROID, LEVOTHROID) 50 MCG tablet Take 1 tablet (50 mcg total) by mouth daily. 90 tablet 4   Multiple Vitamin (MULTIVITAMIN PO) Take 1 tablet by mouth daily.     vitamin B-12 (CYANOCOBALAMIN) 1000 MCG tablet Take 1,000 mcg by mouth daily.     No current facility-administered medications for this visit.    PHYSICAL EXAMINATION: ECOG PERFORMANCE STATUS: 0 - Asymptomatic  Vitals:   12/23/20 1604  BP: 128/64  Pulse: 83  Resp: 18  Temp: 98.3 F (36.8 C)  SpO2: 100%   Wt Readings from Last 3 Encounters:  12/23/20 116 lb 12.8 oz (53 kg)  11/03/20 114 lb (51.7 kg)  10/16/20 115 lb 4.8 oz (52.3 kg)     GENERAL:alert, no distress and comfortable SKIN: skin color, texture, turgor are normal, no rashes or  significant lesions EYES: normal, Conjunctiva are pink and non-injected, sclera clear ABDOMEN:abdomen soft, non-tender and normal bowel sounds, surgical incision has healed very well. Musculoskeletal:no cyanosis of digits and no clubbing  NEURO: alert & oriented x 3 with fluent speech, no focal motor/sensory deficits  LABORATORY DATA:  I have reviewed the data as listed CBC Latest Ref Rng & Units 12/22/2020 11/19/2020 10/16/2020  WBC 4.0 - 10.5 K/uL 5.1 5.2 4.7  Hemoglobin 12.0 - 15.0 g/dL 12.7 12.0 11.8(L)  Hematocrit 36.0 - 46.0 % 38.1 36.3 36.3  Platelets 150 - 400 K/uL 121(L) 143(L) 140(L)     CMP Latest Ref Rng & Units 12/22/2020 11/19/2020 10/16/2020  Glucose 70 - 99 mg/dL 102(H) 99 115(H)  BUN 6 - 20 mg/dL 5(L) 8 6   Creatinine 0.44 - 1.00 mg/dL 0.67 0.52 0.61  Sodium 135 - 145 mmol/L 141 138 139  Potassium 3.5 - 5.1 mmol/L 3.8 4.0 4.2  Chloride 98 - 111 mmol/L 104 103 103  CO2 22 - 32 mmol/L 28 29 29   Calcium 8.9 - 10.3 mg/dL 9.3 8.9 9.1  Total Protein 6.5 - 8.1 g/dL 7.0 6.8 6.9  Total Bilirubin 0.3 - 1.2 mg/dL 0.5 0.5 0.5  Alkaline Phos 38 - 126 U/L 109 107 112  AST 15 - 41 U/L 40 36 33  ALT 0 - 44 U/L 36 34 33      RADIOGRAPHIC STUDIES: I have personally reviewed the radiological images as listed and agreed with the findings in the report. NM PET Image Restag (PS) Skull Base To Thigh  Result Date: 12/22/2020 CLINICAL DATA:  Subsequent treatment strategy for pancreatic carcinoma. Rising CA 19-9 level. EXAM: NUCLEAR MEDICINE PET SKULL BASE TO THIGH TECHNIQUE: 5.7 mCi F-18 FDG was injected intravenously. Full-ring PET imaging was performed from the skull base to thigh after the radiotracer. CT data was obtained and used for attenuation correction and anatomic localization. Fasting blood glucose: 103 mg/dl COMPARISON:  CTs on 12/08/2020 and 10/23/2019, and PET-CT on 02/14/2019 FINDINGS: Mediastinal blood-pool activity (background): SUV max = 2.0 Liver activity (reference): SUV max = N/A NECK:  No hypermetabolic lymph nodes or masses. Incidental CT findings:  None. CHEST: No hypermetabolic masses or lymphadenopathy. No suspicious pulmonary nodules seen on CT images. Incidental CT findings:  None. ABDOMEN/PELVIS: No abnormal hypermetabolic activity within the liver, pancreas, adrenal glands, or spleen. No hypermetabolic lymph nodes in the abdomen or pelvis. Incidental CT findings: Sub-cm retroperitoneal lymph nodes in the left paraaortic region are unchanged, and show no FDG uptake. Stable hepatic steatosis and postop changes from Whipple procedure. SKELETON: No focal hypermetabolic bone lesions to suggest skeletal metastasis. Incidental CT findings:  None. IMPRESSION: No evidence of recurrent or metastatic  carcinoma. Electronically Signed   By: Marlaine Hind M.D.   On: 12/22/2020 16:38     ASSESSMENT & PLAN:  RAYLEN KEN is a 60 y.o. female with   1. Pancreatic Cancer, Stage IIB, cT2N1M0, ypT1N1 -She was diagnosed in 10/2019. Her initial US showed bile duct and gallbladder dilation due to 2-3cm mass at head of pancreas/Uncinate. Her MRI showed her mass is laying close to SMV but not involving it. Her CT CAP from 10/23/19 showed borderline potential adenopathy in the periaortic region which will be followed on restaging scans. There is no definite vascular involvement or distant metastasis.  -She underwent EUS and ERCP on 10/10/19 and had 2 stents placed by Dr Paulita Fujita. Her pancreatic mass biopsy showed Adenocarcinoma of Pancreas. Endoscopic staging T2N1, no evidence of  vascular involvement. -She completed 8 cycles of neoadjuvant FOLFIRINOX 11/09/19-02/20/20, tolerated moderately well -She underwent Whipple surgery with Dr Barry Dienes on 03/24/20. Path showed a residual 1 cm moderately differentiated adenocarcinoma, and 1 out of 14 positive lymph nodes.  Surgical margins were negative.  Lymph nodes biopsy from portal, hepatic artery, and aortocaval were all negative. -She completed 4 cycles of adjuvant chemo Gemcitabine/Xeloda 05/19/20-08/04/20. She is currently on surveillance.  -CA 19-9 normalized from Feb - March 2022 on adjuvant chemo, slowly increased to 43 on 10/16/20 and now 58 on 11/19/20 during surveillance.  -CT CAP 12/08/20 concerning for enlarged RP adenopathy and new pancreatic ductal dilatation. PET 12/22/20 showed no hypermetabolic recurrence or metastatic disease.  We reviewed her scans in our GI tumor board this morning, the retroperitoneal adenopathy felt to be stable since 2021, given the negative PET scanning, no high suspicious for cancer recurrence. -Not sure if elevated CA 19.9 is related to her pancreatic duct dilatation (she has a stent in the pancreatic duct) -She is clinically doing very well,  asymptomatic, I recommended continue monitoring, and repeat a CT abdomen pelvis with contrast in 3 months.  She agrees with the plan.   2. Genetic Testing in 10/2019 was negative for pathogenetic mutations.    PLAN:  -PET scan reviewed, no evidence of recurrence -Lab and flush in 6 weeks, see Dr. Barry Dienes after lab -Follow-up with me in 3 months with lab, flush and CT abdomen pelvis with contrast a few days before    No problem-specific Assessment & Plan notes found for this encounter.   Orders Placed This Encounter  Procedures   CT Abdomen Pelvis W Contrast    Standing Status:   Future    Standing Expiration Date:   12/23/2021    Order Specific Question:   If indicated for the ordered procedure, I authorize the administration of contrast media per Radiology protocol    Answer:   Yes    Order Specific Question:   Is patient pregnant?    Answer:   No    Order Specific Question:   Preferred imaging location?    Answer:   [facility]    Order Specific Question:   Release to patient    Answer:   Immediate    Order Specific Question:   Is Oral Contrast requested for this exam?    Answer:   Yes, Per Radiology protocol    All questions were answered. The patient knows to call the clinic with any problems, questions or concerns. No barriers to learning was detected. The total time spent in the appointment was 30 minutes.     Truitt Merle, MD 12/23/2020   I, Wilburn Mylar, am acting as scribe for Truitt Merle, MD.   I have reviewed the above documentation for accuracy and completeness, and I agree with the above.

## 2020-12-25 ENCOUNTER — Telehealth: Payer: Self-pay | Admitting: Hematology

## 2020-12-25 NOTE — Telephone Encounter (Signed)
Scheduled follow-up appointments per 8/24 los. Patient is aware. 

## 2020-12-28 ENCOUNTER — Ambulatory Visit: Payer: BC Managed Care – PPO | Admitting: Hematology

## 2021-01-18 DIAGNOSIS — H01009 Unspecified blepharitis unspecified eye, unspecified eyelid: Secondary | ICD-10-CM | POA: Diagnosis not present

## 2021-01-18 DIAGNOSIS — H00019 Hordeolum externum unspecified eye, unspecified eyelid: Secondary | ICD-10-CM | POA: Diagnosis not present

## 2021-02-03 ENCOUNTER — Inpatient Hospital Stay: Payer: BC Managed Care – PPO | Attending: Hematology

## 2021-02-03 ENCOUNTER — Other Ambulatory Visit: Payer: Self-pay

## 2021-02-03 DIAGNOSIS — C250 Malignant neoplasm of head of pancreas: Secondary | ICD-10-CM

## 2021-02-03 DIAGNOSIS — Z8507 Personal history of malignant neoplasm of pancreas: Secondary | ICD-10-CM | POA: Insufficient documentation

## 2021-02-03 DIAGNOSIS — Z452 Encounter for adjustment and management of vascular access device: Secondary | ICD-10-CM | POA: Diagnosis not present

## 2021-02-03 DIAGNOSIS — Z95828 Presence of other vascular implants and grafts: Secondary | ICD-10-CM

## 2021-02-03 LAB — CBC WITH DIFFERENTIAL (CANCER CENTER ONLY)
Abs Immature Granulocytes: 0 10*3/uL (ref 0.00–0.07)
Basophils Absolute: 0 10*3/uL (ref 0.0–0.1)
Basophils Relative: 1 %
Eosinophils Absolute: 0.1 10*3/uL (ref 0.0–0.5)
Eosinophils Relative: 1 %
HCT: 36.7 % (ref 36.0–46.0)
Hemoglobin: 12.1 g/dL (ref 12.0–15.0)
Immature Granulocytes: 0 %
Lymphocytes Relative: 45 %
Lymphs Abs: 2.2 10*3/uL (ref 0.7–4.0)
MCH: 29.7 pg (ref 26.0–34.0)
MCHC: 33 g/dL (ref 30.0–36.0)
MCV: 90.2 fL (ref 80.0–100.0)
Monocytes Absolute: 0.6 10*3/uL (ref 0.1–1.0)
Monocytes Relative: 12 %
Neutro Abs: 2 10*3/uL (ref 1.7–7.7)
Neutrophils Relative %: 41 %
Platelet Count: 127 10*3/uL — ABNORMAL LOW (ref 150–400)
RBC: 4.07 MIL/uL (ref 3.87–5.11)
RDW: 13.7 % (ref 11.5–15.5)
WBC Count: 4.8 10*3/uL (ref 4.0–10.5)
nRBC: 0 % (ref 0.0–0.2)

## 2021-02-03 LAB — CMP (CANCER CENTER ONLY)
ALT: 37 U/L (ref 0–44)
AST: 38 U/L (ref 15–41)
Albumin: 4.1 g/dL (ref 3.5–5.0)
Alkaline Phosphatase: 99 U/L (ref 38–126)
Anion gap: 8 (ref 5–15)
BUN: 8 mg/dL (ref 6–20)
CO2: 28 mmol/L (ref 22–32)
Calcium: 9 mg/dL (ref 8.9–10.3)
Chloride: 105 mmol/L (ref 98–111)
Creatinine: 0.65 mg/dL (ref 0.44–1.00)
GFR, Estimated: >60 mL/min (ref >60)
Glucose, Bld: 108 mg/dL — ABNORMAL HIGH (ref 70–99)
Potassium: 4 mmol/L (ref 3.5–5.1)
Sodium: 141 mmol/L (ref 135–145)
Total Bilirubin: 0.4 mg/dL (ref 0.3–1.2)
Total Protein: 6.7 g/dL (ref 6.5–8.1)

## 2021-02-03 MED ORDER — FILGRASTIM-AAFI 480 MCG/0.8ML IJ SOSY: 480.0000 ug | PREFILLED_SYRINGE | Freq: Once | INTRAMUSCULAR | Status: DC

## 2021-02-03 MED ORDER — SODIUM CHLORIDE 0.9% FLUSH
10.0000 mL | Freq: Once | INTRAVENOUS | Status: AC
  Administered 2021-02-03: 10 mL

## 2021-02-03 MED ORDER — HEPARIN SOD (PORK) LOCK FLUSH 100 UNIT/ML IV SOLN
500.0000 [IU] | Freq: Once | INTRAVENOUS | Status: AC
  Administered 2021-02-03: 500 [IU]

## 2021-02-04 LAB — CANCER ANTIGEN 19-9: CA 19-9: 38 U/mL — ABNORMAL HIGH (ref 0–35)

## 2021-02-05 DIAGNOSIS — C250 Malignant neoplasm of head of pancreas: Secondary | ICD-10-CM | POA: Diagnosis not present

## 2021-03-09 DIAGNOSIS — Z0000 Encounter for general adult medical examination without abnormal findings: Secondary | ICD-10-CM | POA: Diagnosis not present

## 2021-03-09 DIAGNOSIS — Z789 Other specified health status: Secondary | ICD-10-CM | POA: Diagnosis not present

## 2021-03-09 DIAGNOSIS — R7309 Other abnormal glucose: Secondary | ICD-10-CM | POA: Diagnosis not present

## 2021-03-09 DIAGNOSIS — E039 Hypothyroidism, unspecified: Secondary | ICD-10-CM | POA: Diagnosis not present

## 2021-03-09 DIAGNOSIS — Z23 Encounter for immunization: Secondary | ICD-10-CM | POA: Diagnosis not present

## 2021-03-09 DIAGNOSIS — Z13220 Encounter for screening for lipoid disorders: Secondary | ICD-10-CM | POA: Diagnosis not present

## 2021-03-22 ENCOUNTER — Other Ambulatory Visit: Payer: Self-pay

## 2021-03-22 ENCOUNTER — Inpatient Hospital Stay: Payer: BC Managed Care – PPO | Attending: Hematology

## 2021-03-22 ENCOUNTER — Ambulatory Visit (HOSPITAL_COMMUNITY)
Admission: RE | Admit: 2021-03-22 | Discharge: 2021-03-22 | Disposition: A | Discharge status: Home / Self Care | Payer: BC Managed Care – PPO | Source: Ambulatory Visit | Attending: Hematology | Admitting: Hematology

## 2021-03-22 ENCOUNTER — Encounter (HOSPITAL_COMMUNITY): Payer: Self-pay

## 2021-03-22 DIAGNOSIS — K5730 Diverticulosis of large intestine without perforation or abscess without bleeding: Secondary | ICD-10-CM | POA: Diagnosis not present

## 2021-03-22 DIAGNOSIS — K8689 Other specified diseases of pancreas: Secondary | ICD-10-CM | POA: Diagnosis not present

## 2021-03-22 DIAGNOSIS — C259 Malignant neoplasm of pancreas, unspecified: Secondary | ICD-10-CM | POA: Diagnosis not present

## 2021-03-22 DIAGNOSIS — C250 Malignant neoplasm of head of pancreas: Secondary | ICD-10-CM

## 2021-03-22 DIAGNOSIS — R590 Localized enlarged lymph nodes: Secondary | ICD-10-CM | POA: Diagnosis not present

## 2021-03-22 DIAGNOSIS — Z95828 Presence of other vascular implants and grafts: Secondary | ICD-10-CM

## 2021-03-22 LAB — CBC WITH DIFFERENTIAL (CANCER CENTER ONLY)
Abs Immature Granulocytes: 0.03 10*3/uL (ref 0.00–0.07)
Basophils Absolute: 0 10*3/uL (ref 0.0–0.1)
Basophils Relative: 0 %
Eosinophils Absolute: 0.1 10*3/uL (ref 0.0–0.5)
Eosinophils Relative: 1 %
HCT: 37.2 % (ref 36.0–46.0)
Hemoglobin: 12.6 g/dL (ref 12.0–15.0)
Immature Granulocytes: 0 %
Lymphocytes Relative: 26 %
Lymphs Abs: 2.4 10*3/uL (ref 0.7–4.0)
MCH: 30.3 pg (ref 26.0–34.0)
MCHC: 33.9 g/dL (ref 30.0–36.0)
MCV: 89.4 fL (ref 80.0–100.0)
Monocytes Absolute: 1 10*3/uL (ref 0.1–1.0)
Monocytes Relative: 11 %
Neutro Abs: 5.8 10*3/uL (ref 1.7–7.7)
Neutrophils Relative %: 62 %
Platelet Count: 167 10*3/uL (ref 150–400)
RBC: 4.16 MIL/uL (ref 3.87–5.11)
RDW: 13.4 % (ref 11.5–15.5)
WBC Count: 9.4 10*3/uL (ref 4.0–10.5)
nRBC: 0 % (ref 0.0–0.2)

## 2021-03-22 LAB — CMP (CANCER CENTER ONLY)
ALT: 32 U/L (ref 0–44)
AST: 37 U/L (ref 15–41)
Albumin: 4 g/dL (ref 3.5–5.0)
Alkaline Phosphatase: 97 U/L (ref 38–126)
Anion gap: 10 (ref 5–15)
BUN: 6 mg/dL (ref 6–20)
CO2: 26 mmol/L (ref 22–32)
Calcium: 9 mg/dL (ref 8.9–10.3)
Chloride: 99 mmol/L (ref 98–111)
Creatinine: 0.44 mg/dL (ref 0.44–1.00)
GFR, Estimated: >60 mL/min (ref >60)
Glucose, Bld: 115 mg/dL — ABNORMAL HIGH (ref 70–99)
Potassium: 3.6 mmol/L (ref 3.5–5.1)
Sodium: 135 mmol/L (ref 135–145)
Total Bilirubin: 0.8 mg/dL (ref 0.3–1.2)
Total Protein: 7.3 g/dL (ref 6.5–8.1)

## 2021-03-22 MED ORDER — IOHEXOL 350 MG/ML SOLN
75.0000 mL | Freq: Once | INTRAVENOUS | Status: AC | PRN
  Administered 2021-03-22: 75 mL via INTRAVENOUS

## 2021-03-22 MED ORDER — HEPARIN SOD (PORK) LOCK FLUSH 100 UNIT/ML IV SOLN
INTRAVENOUS | Status: AC
  Administered 2021-03-22: 500 [IU] via INTRAVENOUS
  Filled 2021-03-22: qty 5

## 2021-03-22 MED ORDER — SODIUM CHLORIDE 0.9% FLUSH
10.0000 mL | Freq: Once | INTRAVENOUS | Status: AC
  Administered 2021-03-22: 10 mL

## 2021-03-22 MED ORDER — HEPARIN SOD (PORK) LOCK FLUSH 100 UNIT/ML IV SOLN: 500.0000 [IU] | Freq: Once | INTRAVENOUS | Status: AC

## 2021-03-23 ENCOUNTER — Telehealth: Payer: Self-pay

## 2021-03-23 ENCOUNTER — Other Ambulatory Visit: Payer: Self-pay | Admitting: Hematology

## 2021-03-23 DIAGNOSIS — Z8507 Personal history of malignant neoplasm of pancreas: Secondary | ICD-10-CM | POA: Diagnosis not present

## 2021-03-23 DIAGNOSIS — C250 Malignant neoplasm of head of pancreas: Secondary | ICD-10-CM

## 2021-03-23 DIAGNOSIS — J189 Pneumonia, unspecified organism: Secondary | ICD-10-CM | POA: Diagnosis not present

## 2021-03-23 LAB — CANCER ANTIGEN 19-9: CA 19-9: 36 U/mL — ABNORMAL HIGH (ref 0–35)

## 2021-03-23 NOTE — Telephone Encounter (Signed)
Pt called regarding her chest CT results from yesterday 03/22/2021.  Pt stated that her CT results showed she has pneumonia and the pt wanted to know who & what should she do as far as follow-up.  Instructed pt to follow-up with her primary care provider regarding her pneumonia.  Pt verbalized and demonstrated understanding of instruction.  Pt's PCP Dr. Milagros Evener w/[facility] contacted Dr Burr Medico wanting advise on how to treat the pt's pneumonia since the pt has cancer.  Dr. Burr Medico was currently in clinic seeing patients when Dr. Radene Ou' office called.  Dr. Radene Ou is requesting Dr. Burr Medico give her a call.  Will let Dr. Burr Medico know Dr Radene Ou would like for her to give her a call.

## 2021-03-29 ENCOUNTER — Inpatient Hospital Stay: Payer: BC Managed Care – PPO | Admitting: Hematology

## 2021-04-19 ENCOUNTER — Ambulatory Visit
Admission: RE | Admit: 2021-04-19 | Discharge: 2021-04-19 | Disposition: A | Discharge status: Home / Self Care | Payer: BC Managed Care – PPO | Source: Ambulatory Visit | Attending: Family Medicine | Admitting: Family Medicine

## 2021-04-19 ENCOUNTER — Other Ambulatory Visit: Payer: Self-pay | Admitting: Family Medicine

## 2021-04-19 DIAGNOSIS — J189 Pneumonia, unspecified organism: Secondary | ICD-10-CM | POA: Diagnosis not present

## 2021-04-19 DIAGNOSIS — Q676 Pectus excavatum: Secondary | ICD-10-CM | POA: Diagnosis not present

## 2021-05-14 ENCOUNTER — Other Ambulatory Visit: Payer: Self-pay

## 2021-05-14 ENCOUNTER — Ambulatory Visit (HOSPITAL_COMMUNITY)
Admission: RE | Admit: 2021-05-14 | Discharge: 2021-05-14 | Disposition: A | Discharge status: Home / Self Care | Payer: BC Managed Care – PPO | Source: Ambulatory Visit | Attending: Hematology | Admitting: Hematology

## 2021-05-14 DIAGNOSIS — J189 Pneumonia, unspecified organism: Secondary | ICD-10-CM | POA: Diagnosis not present

## 2021-05-14 DIAGNOSIS — C250 Malignant neoplasm of head of pancreas: Secondary | ICD-10-CM | POA: Insufficient documentation

## 2021-05-17 ENCOUNTER — Telehealth: Payer: Self-pay

## 2021-05-17 NOTE — Telephone Encounter (Signed)
Per Dr. Burr Medico, message was sent through [place] making pt aware of no concerns for recent CT results. Advised pt that scheduling will contact her for follow up appts.

## 2021-05-28 DIAGNOSIS — H6123 Impacted cerumen, bilateral: Secondary | ICD-10-CM | POA: Diagnosis not present

## 2021-06-04 ENCOUNTER — Telehealth: Payer: Self-pay | Admitting: Hematology

## 2021-06-04 NOTE — Telephone Encounter (Signed)
Scheduled follow-up appointment per 1/13 result note. Patient is aware.

## 2021-06-10 ENCOUNTER — Encounter: Payer: Self-pay | Admitting: Hematology

## 2021-06-10 ENCOUNTER — Inpatient Hospital Stay: Payer: BC Managed Care – PPO

## 2021-06-10 ENCOUNTER — Other Ambulatory Visit: Payer: Self-pay

## 2021-06-10 ENCOUNTER — Inpatient Hospital Stay: Payer: BC Managed Care – PPO | Attending: Hematology | Admitting: Hematology

## 2021-06-10 VITALS — BP 113/58 | HR 68 | Temp 98.8°F | Resp 17 | Ht 65.0 in | Wt 124.3 lb

## 2021-06-10 DIAGNOSIS — R978 Other abnormal tumor markers: Secondary | ICD-10-CM | POA: Insufficient documentation

## 2021-06-10 DIAGNOSIS — C250 Malignant neoplasm of head of pancreas: Secondary | ICD-10-CM

## 2021-06-10 DIAGNOSIS — Z9221 Personal history of antineoplastic chemotherapy: Secondary | ICD-10-CM | POA: Insufficient documentation

## 2021-06-10 DIAGNOSIS — Z8507 Personal history of malignant neoplasm of pancreas: Secondary | ICD-10-CM | POA: Insufficient documentation

## 2021-06-10 DIAGNOSIS — Z95828 Presence of other vascular implants and grafts: Secondary | ICD-10-CM

## 2021-06-10 LAB — CBC WITH DIFFERENTIAL (CANCER CENTER ONLY)
Abs Immature Granulocytes: 0.01 10*3/uL (ref 0.00–0.07)
Basophils Absolute: 0 10*3/uL (ref 0.0–0.1)
Basophils Relative: 1 %
Eosinophils Absolute: 0.1 10*3/uL (ref 0.0–0.5)
Eosinophils Relative: 2 %
HCT: 38.9 % (ref 36.0–46.0)
Hemoglobin: 13 g/dL (ref 12.0–15.0)
Immature Granulocytes: 0 %
Lymphocytes Relative: 44 %
Lymphs Abs: 2 10*3/uL (ref 0.7–4.0)
MCH: 30.2 pg (ref 26.0–34.0)
MCHC: 33.4 g/dL (ref 30.0–36.0)
MCV: 90.5 fL (ref 80.0–100.0)
Monocytes Absolute: 0.5 10*3/uL (ref 0.1–1.0)
Monocytes Relative: 12 %
Neutro Abs: 1.8 10*3/uL (ref 1.7–7.7)
Neutrophils Relative %: 41 %
Platelet Count: 147 10*3/uL — ABNORMAL LOW (ref 150–400)
RBC: 4.3 MIL/uL (ref 3.87–5.11)
RDW: 13.7 % (ref 11.5–15.5)
WBC Count: 4.5 10*3/uL (ref 4.0–10.5)
nRBC: 0 % (ref 0.0–0.2)

## 2021-06-10 LAB — CMP (CANCER CENTER ONLY)
ALT: 35 U/L (ref 0–44)
AST: 35 U/L (ref 15–41)
Albumin: 4.4 g/dL (ref 3.5–5.0)
Alkaline Phosphatase: 84 U/L (ref 38–126)
Anion gap: 5 (ref 5–15)
BUN: 6 mg/dL (ref 6–20)
CO2: 31 mmol/L (ref 22–32)
Calcium: 9.3 mg/dL (ref 8.9–10.3)
Chloride: 104 mmol/L (ref 98–111)
Creatinine: 0.58 mg/dL (ref 0.44–1.00)
GFR, Estimated: >60 mL/min (ref >60)
Glucose, Bld: 140 mg/dL — ABNORMAL HIGH (ref 70–99)
Potassium: 4 mmol/L (ref 3.5–5.1)
Sodium: 140 mmol/L (ref 135–145)
Total Bilirubin: 0.5 mg/dL (ref 0.3–1.2)
Total Protein: 6.9 g/dL (ref 6.5–8.1)

## 2021-06-10 MED ORDER — SODIUM CHLORIDE 0.9% FLUSH
10.0000 mL | Freq: Once | INTRAVENOUS | Status: AC
  Administered 2021-06-10: 10 mL

## 2021-06-10 MED ORDER — HEPARIN SOD (PORK) LOCK FLUSH 100 UNIT/ML IV SOLN
500.0000 [IU] | Freq: Once | INTRAVENOUS | Status: AC
  Administered 2021-06-10: 500 [IU]

## 2021-06-10 NOTE — Progress Notes (Signed)
[place]   [phone]) [phone] [phone]   Clinic Follow up Note   Patient Care Team: Vernie Shanks, MD as PCP - General (Family Medicine) Fontaine, Belinda Block, MD (Inactive) as Consulting Physician (Gynecology) Truitt Merle, MD as Consulting Physician (Oncology) Arta Silence, MD as Consulting Physician (Gastroenterology) Stark Klein, MD as Consulting Physician (General Surgery) Karie Mainland, RD as Dietitian (Nutrition)  Date of Service:  06/10/2021  CHIEF COMPLAINT: f/u of pancreatic cancer  CURRENT THERAPY:  Surveillance  ASSESSMENT & PLAN:  Breanna Noble is a 61 y.o. female with   1. Pancreatic Cancer, Stage IIB, cT2N1M0, ypT1N1 -She was diagnosed in 10/2019. Her initial US showed bile duct and gallbladder dilation due to 2-3cm mass at head of pancreas/Uncinate. Her MRI showed her mass is laying close to SMV but not involving it. Her CT CAP from 10/23/19 showed borderline potential adenopathy in the periaortic region which will be followed on restaging scans. There is no definite vascular involvement or distant metastasis.  -She underwent EUS and ERCP on 10/10/19 and had 2 stents placed by Dr Paulita Fujita. Her pancreatic mass biopsy showed Adenocarcinoma of Pancreas. Endoscopic staging T2N1, no evidence of vascular involvement. -She completed 8 cycles of neoadjuvant FOLFIRINOX 11/09/19-02/20/20, tolerated moderately well -She underwent Whipple surgery with Dr Barry Dienes on 03/24/20. Path showed a residual 1 cm moderately differentiated adenocarcinoma, and 1 out of 14 positive lymph nodes.  Surgical margins were negative.  Lymph nodes biopsy from portal, hepatic artery, and aortocaval were all negative. -She completed 4 cycles of adjuvant chemo Gemcitabine/Xeloda 05/19/20-08/04/20. She is currently on surveillance.  -restaging CT AP on 03/22/21 showed NED. -CA 19.9 is overall stable, only slightly elevated in 03/2021. Today's result is pending. -She is clinically  doing very well. Labs reviewed, CBC WNL, other results are pending. Physical exam was unremarkable. We will plan to repeat a CT abdomen pelvis with contrast in 3 months.   2. Genetic Testing in 10/2019 was negative for pathogenetic mutations.  3. Right Adnexal Mass, Abdominal pain -stable on most recent pelvic US 11/03/20, not seen on CT AP 03/22/21. -she reports intermittent RLQ abdominal pain. I recommend she undergo repeat pelvic US with her GYN. If pain gets worse, will get CT scan sooner      PLAN:  -Lab and flush in 6 weeks -Follow-up with me in 3 months with lab, flush and CT abdomen pelvis with contrast a few days before, or sooner if tumor marker gets much worse or abdominal pain gets worse    No problem-specific Assessment & Plan notes found for this encounter.   SUMMARY OF ONCOLOGIC HISTORY: Oncology History Overview Note  Cancer Staging Pancreatic cancer [facility]) Staging form: Exocrine Pancreas, AJCC 8th Edition - Clinical stage from 10/16/2019: Stage IIB (cT2, cN1, cM0) - Signed by Truitt Merle, MD on 10/16/2019    Pancreatic cancer ([place])  10/03/2019 Imaging   US Abdomen 10/03/19  IMPRESSION: Markedly dilated bile ducts. Dilated gallbladder with sludge but no gallstones.   2.9 cm mass in the uncinate process most likely pancreatic neoplasm causing biliary obstruction. Recommend MRI of the pancreas without with contrast for further evaluation. If the patient cannot have MRI, CT of the pancreas with contrast recommended.   10/07/2019 Imaging   MRI Abdomen 10/07/19  IMPRESSION: 1. There is a hypoenhancing mass of the pancreatic uncinate with abrupt obstruction of the central common bile duct, measuring approximately 2.9 cm, consistent with pancreatic adenocarcinoma. 2. Gross intra and extrahepatic biliary ductal dilatation,  common bile duct measuring 1.4 cm. Gallbladder hydrops. 3. The pancreatic duct is nondilated. 4. Pancreatic mass lies closely adjacent to the most  central portions of the superior mesenteric vein and central portal vein, due to motion artifact it is difficult to clearly discern whether there is a preserved fat plane. Multiphasic contrast enhanced pancreatic protocol CT may be less motion sensitive and better delineate vascular structures for the purposes of surgical planning if necessary. 5. No evidence of lymphadenopathy or metastatic disease in the abdomen.   10/10/2019 Procedure   EUS by Dr Paulita Fujita 10/10/19  IMPRESSION - A mass was identified at junction of head/uncinate process of the pancreas. This was staged T2 N1 Mx by endosonographic criteria. Fine needle aspiration performed. - A few abnormal lymph nodes were visualized in the peripancreatic region. - There was dilation in the common bile duct which measured up to 14 mm. - Hyperechoic material consistent with sludge was visualized endosonographically in the gallbladder.   10/10/2019 Procedure   ERCP by Dr Paulita Fujita 10/10/19  IMPRESSION - The major papilla appeared normal. - One temporary stent was placed into the ventral pancreatic duct. - A biliary sphincterotomy was performed. - One covered metal stent was placed into the common bile duct.   10/10/2019 Initial Biopsy   FINAL MICROSCOPIC DIAGNOSIS: 10/10/19  A. PANCREAS, HEAD, FINE NEEDLE ASPIRATION:  - Malignant cells present, consistent with adenocarcinoma    10/16/2019 Initial Diagnosis   Pancreatic cancer ([place])   10/16/2019 Cancer Staging   Staging form: Exocrine Pancreas, AJCC 8th Edition - Clinical stage from 10/16/2019: Stage IIB (cT2, cN1, cM0) - Signed by Truitt Merle, MD on 10/16/2019    10/23/2019 Imaging   CT CAP W contrast  IMPRESSION: 1. Pancreatic mass with local adenopathy and potential retroperitoneal adenopathy. Given retroperitoneal lymph nodes and presence of venous collaterals PET scan may be useful for staging purposes. 2. No definite vascular involvement or signs of upper abdominal venous  collaterals. Some stranding around the celiac may be due to recent biopsy. 3. Borderline enlarged RIGHT external iliac lymph node. Attention on follow-up. 4. No evidence of metastatic disease in the chest. 5. Signs of recent ERCP and stent placement.   10/25/2019 Procedure   PAC placed    11/09/2019 - 02/20/2020 Chemotherapy   Neoadjuvant FOLFIRINOX q2weeks starting 11/09/19-02/20/20 for 8 cycles   11/28/2019 Genetic Testing   Negative genetic testing on the common hereditary cancer panel, pancreatic cancer panel, preliminary evidence pancreatic cancer panel and chronic pancreatitis panel.  The Common Hereditary Gene Panel offered by [facility] includes sequencing and/or deletion duplication testing of the following 55 genes: APC*, ATM*, AXIN2, BARD1, BMPR1A, BRCA1, BRCA2, BRIP1, CASR, CDH1, CDK4, CDKN2A (p14ARF), CDKN2A (p16INK4a), CFTR*, CHEK2, CPA1, CTNNA1, CTRC, DICER1*, EPCAM*, FANCC, GREM1*, HOXB13, KIT, MEN1*, MLH1*, MSH2*, MSH3*, MSH6*, MUTYH, NBN, NF1*, NTHL1, PALB2, PALLD, PDGFRA, PMS2*, POLD1*, POLE, PRSS1*, PTEN*, RAD50, RAD51C, RAD51D, SDHA*, SDHB, SDHC*, SDHD, SMAD4, SMARCA4, SPINK1, STK11, TP53, TSC1*, TSC2, and VHL .  The report date is November 28, 2019.   01/28/2020 Imaging   CT AP w contrast  IMPRESSION: 1. No substantial interval change in exam. 2. Similar appearance of the pancreatic mass with associated main pancreatic ductal dilatation. 3. Soft tissue stranding again noted around the celiac axis, in the para-aortic retroperitoneal space and along the IMA (new in the interval). These findings are associated with similar appearance of upper normal to borderline hepatoduodenal ligament and retroperitoneal lymphadenopathy. Close continued attention on follow-up recommended. 4. Borderline enlarged right external iliac node  identified on previous study has decreased in size in the interval.   02/14/2020 PET scan   IMPRESSION: 1. The pancreatic mass has a maximum SUV of 6.1. No  findings of hypermetabolic local adenopathy or distant metastatic spread. 2. Splenomegaly. 3. Inspissated barium or appendicolith within the appendix. No signs of appendiceal inflammation. 4. Scattered sigmoid colon diverticula. ADDENDUM: The original report was by Dr. Van Clines. The following addendum is by Dr. Van Clines:   On the CT from 01/28/2020 there were some upper normal sized/borderline retroperitoneal lymph nodes including a 0.8 cm aortocaval node on image 72 of series 9. This currently has a maximum SUV of 2.2, which is similar to blood pool activity. An indistinctly marginated lymph node just below the left renal vein has a maximum SUV of 2.4, just above blood pool levels., and measures about 1.0 cm in diameter. Lymph nodes anterior to the left psoas muscle have maximum SUV of 2.0, similar to blood pool. Overall these lymph nodes are borderline enlarged and with activity level at or just minimally above blood pool. While certainly not grossly positive, their proximity to the pancreatic head mass and borderline imaging characteristics make it difficult to exclude early nodal involvement by malignancy.   03/24/2020 Surgery   Whipple surgery, Laparoscopic Diagnostic, Appendectomy by Dr Barry Dienes    03/24/2020 Pathology Results   FINAL MICROSCOPIC DIAGNOSIS:   A. WHIPPLE PROCEDURE, WITH GALLBLADDER:  - Adenocarcinoma, moderately differentiated, spanning 1 cm.  - Tumor involves retroperitoneal peripancreatic soft tissue.  - Resection margins are negative.  - Treatment effect present.  - Rare tumor cells in one of fourteen lymph nodes (1/14).  - Benign gallbladder with chronic inflammation and benign lymph node.  - Bile duct with granulation tissue and reactive changes.  - Benign appendix.  - Mild reactive gastropathy.  - See oncology table.   B. LYMPH NODE, PORTAL, EXCISION:  - Three of three lymph nodes negative for carcinoma (0/3).   C. LYMPH NODE,  COMMON HEPATIC ARTERY, EXCISION:  - Three of three lymph nodes negative for carcinoma (0/3).   D. APPENDIX, APPENDECTOMY:  - Benign appendix.   E. LYMPH NODE, AORTOCAVAL, EXCISION:  - One of one lymph node negative for carcinoma (0/1).    03/24/2020 Cancer Staging   Staging form: Exocrine Pancreas, AJCC 8th Edition - Pathologic stage from 03/24/2020: Stage IIB (pT1c, pN1, cM0) - Signed by Truitt Merle, MD on 04/16/2020    04/14/2020 Imaging   CT AP  IMPRESSION: 1. Interval Whipple procedure. Pancreatic stent in place. 2. Ill-defined soft tissue in the right abdominal wall extending from the subcutaneous tissues to the anterior abdominal wall musculature, but no focal fluid collection. This may represent postsurgical change, sterility indeterminate by imaging. 3. Pelvic anatomy is poorly defined, including pelvic bowel loops, adnexa, and colon. There appears to be sigmoid colonic wall thickening as well as intraluminal fluid, with additional diffuse colonic wall thickening from the transverse colon distally. Findings are suggestive of generalized colitis. Attention to this area on follow-up is recommended. 4. Small amount of non organized free fluid in the right upper quadrant. No evidence of intra-abdominal abscess. 5. Periportal edema, nonspecific. Previous biliary stent has been removed. Common bile duct is not well-defined on the current exam. 6. Mild splenomegaly. 7. Trace right pleural effusion.   05/19/2020 - 08/04/2020 Chemotherapy   Adjuvant Gemcitabine 2 weeks on 1 week off starting 05/19/20 and Xeloda started on 05/25/2020. Xeloda dose currently at $RemoveBefo'1500mg'adtufhYVraq$  in the AM and $Remo'1000mg'OZUGA$  in the  PM. Completed 08/04/20.    08/18/2020 Imaging   CT CAP  IMPRESSION: 1. Interval improvement in previously demonstrated ascites and generalized mesenteric edema. No peritoneal nodularity or focal extraluminal fluid collection identified. 2. Stable postsurgical changes from previous Whipple  procedure. No evidence of local recurrence or metastatic disease. 3. Pancreatic stent remains in place without pancreatic ductal dilatation. Although there is no pneumobilia, there is no significant biliary dilatation. 4. Stable mild splenomegaly. 5. No significant findings in the chest.   12/08/2020 Imaging   CT CAP  IMPRESSION: 1. Status post Whipple pancreaticoduodenectomy. 2. There are numerous newly enlarged, matted appearing retroperitoneal lymph nodes, largest left retroperitoneal nodes measuring up to 1.9 x 1.2 cm. Findings are highly concerning for nodal metastatic disease. 3. Main pancreatic duct stent remains within the pancreatic duct but advanced several centimeters into the jejunal efferent limb when compared to prior examination. There is new, diffuse pancreatic ductal dilatation, measuring up to 6 mm. 4. No evidence of organ metastatic disease or distant metastatic disease in the chest. 5. Small volume perihepatic ascites, unchanged, suspicious for malignant ascites, although without direct evidence of omental or peritoneal metastatic disease. 6. Coronary artery disease.   12/22/2020 PET scan   IMPRESSION: No evidence of recurrent or metastatic carcinoma.      INTERVAL HISTORY:  Breanna Noble is here for a follow up of pancreatic cancer. She was last seen by me on 12/23/20. She presents to the clinic accompanied by her husband. She reports she has recovered well from her pneumonia. She reports some intermittent abdominal pain, and she notes she has an ovarian cyst.    All other systems were reviewed with the patient and are negative.  MEDICAL HISTORY:  Past Medical History:  Diagnosis Date   Cataracts, bilateral    Diverticulosis 12/07/2001   found on colonoscopy   Family history of colon cancer    Family history of pancreatic cancer    Hypothyroid 10/2007   Macular hole    pancreatic ca 08/2019   pancreatic   Pancreatic mass    PONV (postoperative nausea  and vomiting)     SURGICAL HISTORY: Past Surgical History:  Procedure Laterality Date   APPENDECTOMY N/A 03/24/2020   Procedure: APPENDECTOMY;  Surgeon: Stark Klein, MD;  Location: [facility];  Service: General;  Laterality: N/A;   BILIARY STENT PLACEMENT  10/10/2019   Procedure: BILIARY STENT PLACEMENT;  Surgeon: Arta Silence, MD;  Location: [place];  Service: Endoscopy;;   CATARACT EXTRACTION, BILATERAL     COLONOSCOPY     ERCP N/A 10/10/2019   Procedure: ENDOSCOPIC RETROGRADE CHOLANGIOPANCREATOGRAPHY (ERCP);  Surgeon: Arta Silence, MD;  Location: [facility] ENDOSCOPY;  Service: Endoscopy;  Laterality: N/A;   ESOPHAGOGASTRODUODENOSCOPY N/A 10/10/2019   Procedure: ESOPHAGOGASTRODUODENOSCOPY (EGD);  Surgeon: Arta Silence, MD;  Location: [facility] ENDOSCOPY;  Service: Endoscopy;  Laterality: N/A;   EUS N/A 10/10/2019   Procedure: UPPER ENDOSCOPIC ULTRASOUND (EUS) LINEAR;  Surgeon: Arta Silence, MD;  Location: [place];  Service: Endoscopy;  Laterality: N/A;   EYE SURGERY     FINE NEEDLE ASPIRATION  10/10/2019   Procedure: FINE NEEDLE ASPIRATION (FNA) LINEAR;  Surgeon: Arta Silence, MD;  Location: [place] ENDOSCOPY;  Service: Endoscopy;;   IR IMAGING GUIDED PORT INSERTION  10/25/2019   LAPAROSCOPY N/A 03/24/2020   Procedure: LAPAROSCOPY DIAGNOSTIC;  Surgeon: Stark Klein, MD;  Location: [place];  Service: General;  Laterality: N/A;   Macular hole surg     PANCREATIC STENT PLACEMENT  10/10/2019   Procedure: PANCREATIC  STENT PLACEMENT;  Surgeon: Arta Silence, MD;  Location: [facility] ENDOSCOPY;  Service: Endoscopy;;   Joan Mayans  10/10/2019   Procedure: Joan Mayans;  Surgeon: Arta Silence, MD;  Location: [facility] ENDOSCOPY;  Service: Endoscopy;;   TONSILLECTOMY     TONSILLECTOMY     WHIPPLE PROCEDURE N/A 03/24/2020   Procedure: WHIPPLE PROCEDURE;  Surgeon: Stark Klein, MD;  Location: [place];  Service: General;  Laterality: N/A;    I have reviewed the social history and family history with the  patient and they are unchanged from previous note.  ALLERGIES:  has No Known Allergies.  MEDICATIONS:  Current Outpatient Medications  Medication Sig Dispense Refill   Ascorbic Acid (VITAMIN C PO) Take 1,000 mg by mouth daily.      B Complex Vitamins (B-COMPLEX/B-12 PO) Take by mouth.     Calcium Carb-Cholecalciferol (CALCIUM 1000 + D PO) Take 1 tablet by mouth daily.      ferrous sulfate 325 (65 FE) MG tablet Take 325 mg by mouth daily with breakfast.     ibuprofen (ADVIL) 200 MG tablet Take 400 mg by mouth every 6 (six) hours as needed for moderate pain.     levothyroxine (SYNTHROID, LEVOTHROID) 50 MCG tablet Take 1 tablet (50 mcg total) by mouth daily. 90 tablet 4   Multiple Vitamin (MULTIVITAMIN PO) Take 1 tablet by mouth daily.     vitamin B-12 (CYANOCOBALAMIN) 1000 MCG tablet Take 1,000 mcg by mouth daily.     No current facility-administered medications for this visit.    PHYSICAL EXAMINATION: ECOG PERFORMANCE STATUS: 1 - Symptomatic but completely ambulatory  Vitals:   06/10/21 0936  BP: (!) 113/58  Pulse: 68  Resp: 17  Temp: 98.8 F (37.1 C)  SpO2: 100%   Wt Readings from Last 3 Encounters:  06/10/21 124 lb 4.8 oz (56.4 kg)  12/23/20 116 lb 12.8 oz (53 kg)  11/03/20 114 lb (51.7 kg)     GENERAL:alert, no distress and comfortable SKIN: skin color, texture, turgor are normal, no rashes or significant lesions EYES: normal, Conjunctiva are pink and non-injected, sclera clear  NECK: supple, thyroid normal size, non-tender, without nodularity LYMPH:  no palpable lymphadenopathy in the cervical, axillary  LUNGS: clear to auscultation and percussion with normal breathing effort HEART: regular rate & rhythm and no murmurs and no lower extremity edema ABDOMEN:abdomen soft, non-tender and normal bowel sounds Musculoskeletal:no cyanosis of digits and no clubbing  NEURO: alert & oriented x 3 with fluent speech, no focal motor/sensory deficits  LABORATORY DATA:  I have  reviewed the data as listed CBC Latest Ref Rng & Units 06/10/2021 03/22/2021 02/03/2021  WBC 4.0 - 10.5 K/uL 4.5 9.4 4.8  Hemoglobin 12.0 - 15.0 g/dL 13.0 12.6 12.1  Hematocrit 36.0 - 46.0 % 38.9 37.2 36.7  Platelets 150 - 400 K/uL 147(L) 167 127(L)     CMP Latest Ref Rng & Units 06/10/2021 03/22/2021 02/03/2021  Glucose 70 - 99 mg/dL 140(H) 115(H) 108(H)  BUN 6 - 20 mg/dL _0 Creatinine 0.44 - 1.00 mg/dL 0.58 0.44 0.65  Sodium 135 - 145 mmol/L 140 135 141  Potassium 3.5 - 5.1 mmol/L 4.0 3.6 4.0  Chloride 98 - 111 mmol/L 104 99 105  CO2 22 - 32 mmol/L _1 Calcium 8.9 - 10.3 mg/dL 9.3 9.0 9.0  Total Protein 6.5 - 8.1 g/dL 6.9 7.3 6.7  Total Bilirubin 0.3 - 1.2 mg/dL 0.5 0.8 0.4  Alkaline Phos 38 - 126 U/L 84 97 99  AST 15 - 41 U/L 35 37 38  ALT 0 - 44 U/L 35 32 37      RADIOGRAPHIC STUDIES: I have personally reviewed the radiological images as listed and agreed with the findings in the report. No results found.    Orders Placed This Encounter  Procedures   CT ABDOMEN PELVIS W CONTRAST    Standing Status:   Future    Standing Expiration Date:   06/10/2022    Order Specific Question:   If indicated for the ordered procedure, I authorize the administration of contrast media per Radiology protocol    Answer:   Yes    Order Specific Question:   Preferred imaging location?    Answer:   [facility]    Order Specific Question:   Is Oral Contrast requested for this exam?    Answer:   Yes, Per Radiology protocol    Order Specific Question:   Is patient pregnant?    Answer:   No   All questions were answered. The patient knows to call the clinic with any problems, questions or concerns. No barriers to learning was detected. The total time spent in the appointment was 30 minutes.     Truitt Merle, MD 06/10/2021   I, Wilburn Mylar, am acting as scribe for Truitt Merle, MD.   I have reviewed the above documentation for accuracy and completeness, and I agree with the  above.

## 2021-06-11 LAB — CANCER ANTIGEN 19-9: CA 19-9: 42 U/mL — ABNORMAL HIGH (ref 0–35)

## 2021-06-12 ENCOUNTER — Encounter: Payer: Self-pay | Admitting: Hematology

## 2021-07-16 DIAGNOSIS — K5730 Diverticulosis of large intestine without perforation or abscess without bleeding: Secondary | ICD-10-CM | POA: Diagnosis not present

## 2021-07-16 DIAGNOSIS — D120 Benign neoplasm of cecum: Secondary | ICD-10-CM | POA: Diagnosis not present

## 2021-07-16 DIAGNOSIS — Z1211 Encounter for screening for malignant neoplasm of colon: Secondary | ICD-10-CM | POA: Diagnosis not present

## 2021-07-22 ENCOUNTER — Other Ambulatory Visit: Payer: Self-pay

## 2021-07-22 ENCOUNTER — Inpatient Hospital Stay: Payer: BC Managed Care – PPO | Attending: Hematology

## 2021-07-22 DIAGNOSIS — Z8507 Personal history of malignant neoplasm of pancreas: Secondary | ICD-10-CM | POA: Diagnosis not present

## 2021-07-22 DIAGNOSIS — Z452 Encounter for adjustment and management of vascular access device: Secondary | ICD-10-CM | POA: Insufficient documentation

## 2021-07-22 DIAGNOSIS — Z95828 Presence of other vascular implants and grafts: Secondary | ICD-10-CM

## 2021-07-22 DIAGNOSIS — C250 Malignant neoplasm of head of pancreas: Secondary | ICD-10-CM

## 2021-07-22 LAB — CMP (CANCER CENTER ONLY)
ALT: 24 U/L (ref 0–44)
AST: 26 U/L (ref 15–41)
Albumin: 4.3 g/dL (ref 3.5–5.0)
Alkaline Phosphatase: 86 U/L (ref 38–126)
Anion gap: 6 (ref 5–15)
BUN: 8 mg/dL (ref 6–20)
CO2: 31 mmol/L (ref 22–32)
Calcium: 9.7 mg/dL (ref 8.9–10.3)
Chloride: 103 mmol/L (ref 98–111)
Creatinine: 0.55 mg/dL (ref 0.44–1.00)
GFR, Estimated: >60 mL/min (ref >60)
Glucose, Bld: 108 mg/dL — ABNORMAL HIGH (ref 70–99)
Potassium: 3.9 mmol/L (ref 3.5–5.1)
Sodium: 140 mmol/L (ref 135–145)
Total Bilirubin: 0.4 mg/dL (ref 0.3–1.2)
Total Protein: 7 g/dL (ref 6.5–8.1)

## 2021-07-22 LAB — CBC WITH DIFFERENTIAL (CANCER CENTER ONLY)
Abs Immature Granulocytes: 0.01 10*3/uL (ref 0.00–0.07)
Basophils Absolute: 0 10*3/uL (ref 0.0–0.1)
Basophils Relative: 1 %
Eosinophils Absolute: 0.2 10*3/uL (ref 0.0–0.5)
Eosinophils Relative: 3 %
HCT: 40.2 % (ref 36.0–46.0)
Hemoglobin: 13.2 g/dL (ref 12.0–15.0)
Immature Granulocytes: 0 %
Lymphocytes Relative: 38 %
Lymphs Abs: 2.3 10*3/uL (ref 0.7–4.0)
MCH: 29.7 pg (ref 26.0–34.0)
MCHC: 32.8 g/dL (ref 30.0–36.0)
MCV: 90.5 fL (ref 80.0–100.0)
Monocytes Absolute: 0.8 10*3/uL (ref 0.1–1.0)
Monocytes Relative: 13 %
Neutro Abs: 2.7 10*3/uL (ref 1.7–7.7)
Neutrophils Relative %: 45 %
Platelet Count: 173 10*3/uL (ref 150–400)
RBC: 4.44 MIL/uL (ref 3.87–5.11)
RDW: 13.2 % (ref 11.5–15.5)
WBC Count: 6 10*3/uL (ref 4.0–10.5)
nRBC: 0 % (ref 0.0–0.2)

## 2021-07-22 MED ORDER — HEPARIN SOD (PORK) LOCK FLUSH 100 UNIT/ML IV SOLN
500.0000 [IU] | Freq: Once | INTRAVENOUS | Status: AC
  Administered 2021-07-22: 500 [IU]

## 2021-07-22 MED ORDER — SODIUM CHLORIDE 0.9% FLUSH
10.0000 mL | Freq: Once | INTRAVENOUS | Status: AC
  Administered 2021-07-22: 10 mL

## 2021-07-23 LAB — CANCER ANTIGEN 19-9: CA 19-9: 35 U/mL (ref 0–35)

## 2021-07-24 ENCOUNTER — Encounter: Payer: Self-pay | Admitting: Hematology

## 2021-08-05 ENCOUNTER — Other Ambulatory Visit (HOSPITAL_COMMUNITY)
Admission: RE | Admit: 2021-08-05 | Discharge: 2021-08-05 | Disposition: A | Discharge status: Home / Self Care | Payer: BC Managed Care – PPO | Source: Ambulatory Visit | Attending: Obstetrics and Gynecology | Admitting: Obstetrics and Gynecology

## 2021-08-05 ENCOUNTER — Ambulatory Visit: Payer: BC Managed Care – PPO | Admitting: Obstetrics and Gynecology

## 2021-08-05 ENCOUNTER — Encounter: Payer: Self-pay | Admitting: Obstetrics and Gynecology

## 2021-08-05 VITALS — BP 122/74 | HR 66 | Ht 65.5 in | Wt 127.0 lb

## 2021-08-05 DIAGNOSIS — Z124 Encounter for screening for malignant neoplasm of cervix: Secondary | ICD-10-CM

## 2021-08-05 DIAGNOSIS — R1031 Right lower quadrant pain: Secondary | ICD-10-CM | POA: Diagnosis not present

## 2021-08-05 DIAGNOSIS — N9489 Other specified conditions associated with female genital organs and menstrual cycle: Secondary | ICD-10-CM | POA: Diagnosis not present

## 2021-08-05 DIAGNOSIS — N950 Postmenopausal bleeding: Secondary | ICD-10-CM | POA: Diagnosis not present

## 2021-08-05 NOTE — Progress Notes (Signed)
GYNECOLOGY  VISIT ?  ?HPI: ?61 y.o.   Married White or Caucasian Not Hispanic or Latino  female   ?G2P2 with Patient's last menstrual period was 11/22/2013.   ?here for RLQ pain and spotting on Saturday evening.  ? ?She has a h/o pancreatic cancer, diagnosed in 6/21.S/P chemo and Whipple surgery. She has a f/u CT in May. ? ?She has a known right adnexal mass, stable for 4 years at the time of pelvic ultrasound in 7/22, not seen on CT in 11/22.  ? ?She c/o a several month h/o intermittent RLQ abdominal cramping, feels similar to menstrual cramps. The pain is occurring almost daily, only lasts for a minute. Not correlated with eating or BM's. She had a one time episode of spotting last weekend. Noticed on the towel after she took a shower and one time after wiping (s/p BM).  ? ?Colonoscopy was in 3/23, one polyp was removed. Told to f/u in 7 years.  ? ?CT report from 03/22/21 reviewed. No adnexal masses were seen. ? ? ?GYNECOLOGIC HISTORY: ?Patient's last menstrual period was 11/22/2013. ?Contraception:pmp ?Menopausal hormone therapy: none  ?       ?OB History   ? ? Gravida  ?2  ? Para  ?2  ? Term  ?   ? Preterm  ?   ? AB  ?   ? Living  ?2  ?  ? ? SAB  ?   ? IAB  ?   ? Ectopic  ?   ? Multiple  ?   ? Live Births  ?   ?   ?  ?  ?    ? ?Patient Active Problem List  ? Diagnosis Date Noted  ? Malnutrition of moderate degree 03/26/2020  ? Port-A-Cath in place 02/03/2020  ? Genetic testing 11/28/2019  ? Family history of pancreatic cancer   ? Family history of colon cancer   ? Pancreatic cancer ([place]) 10/16/2019  ? Hypothyroid 12/10/2012  ? Diverticulosis 12/10/2012  ? Annual physical exam 12/10/2012  ? Screening examination for infectious disease 12/10/2012  ? ? ?Past Medical History:  ?Diagnosis Date  ? Cataracts, bilateral   ? Diverticulosis 12/07/2001  ? found on colonoscopy  ? Family history of colon cancer   ? Family history of pancreatic cancer   ? Hypothyroid 10/2007  ? Macular hole   ? pancreatic ca 08/2019  ?  pancreatic  ? Pancreatic mass   ? PONV (postoperative nausea and vomiting)   ? ? ?Past Surgical History:  ?Procedure Laterality Date  ? APPENDECTOMY N/A 03/24/2020  ? Procedure: APPENDECTOMY;  Surgeon: Stark Klein, MD;  Location: [place];  Service: General;  Laterality: N/A;  ? BILIARY STENT PLACEMENT  10/10/2019  ? Procedure: BILIARY STENT PLACEMENT;  Surgeon: Arta Silence, MD;  Location: [place];  Service: Endoscopy;;  ? CATARACT EXTRACTION, BILATERAL    ? COLONOSCOPY    ? ERCP N/A 10/10/2019  ? Procedure: ENDOSCOPIC RETROGRADE CHOLANGIOPANCREATOGRAPHY (ERCP);  Surgeon: Arta Silence, MD;  Location: [facility] ENDOSCOPY;  Service: Endoscopy;  Laterality: N/A;  ? ESOPHAGOGASTRODUODENOSCOPY N/A 10/10/2019  ? Procedure: ESOPHAGOGASTRODUODENOSCOPY (EGD);  Surgeon: Arta Silence, MD;  Location: [facility] ENDOSCOPY;  Service: Endoscopy;  Laterality: N/A;  ? EUS N/A 10/10/2019  ? Procedure: UPPER ENDOSCOPIC ULTRASOUND (EUS) LINEAR;  Surgeon: Arta Silence, MD;  Location: [place];  Service: Endoscopy;  Laterality: N/A;  ? EYE SURGERY    ? FINE NEEDLE ASPIRATION  10/10/2019  ? Procedure: FINE NEEDLE ASPIRATION (FNA) LINEAR;  Surgeon: Paulita Fujita,  Gwyndolyn Saxon, MD;  Location: [place];  Service: Endoscopy;;  ? IR IMAGING GUIDED PORT INSERTION  10/25/2019  ? LAPAROSCOPY N/A 03/24/2020  ? Procedure: LAPAROSCOPY DIAGNOSTIC;  Surgeon: Stark Klein, MD;  Location: [place];  Service: General;  Laterality: N/A;  ? Macular hole surg    ? PANCREATIC STENT PLACEMENT  10/10/2019  ? Procedure: PANCREATIC STENT PLACEMENT;  Surgeon: Arta Silence, MD;  Location: [place];  Service: Endoscopy;;  ? SPHINCTEROTOMY  10/10/2019  ? Procedure: SPHINCTEROTOMY;  Surgeon: Arta Silence, MD;  Location: [place];  Service: Endoscopy;;  ? TONSILLECTOMY    ? TONSILLECTOMY    ? WHIPPLE PROCEDURE N/A 03/24/2020  ? Procedure: WHIPPLE PROCEDURE;  Surgeon: Stark Klein, MD;  Location: [place];  Service: General;  Laterality: N/A;  ? ? ?Current Outpatient  Medications  ?Medication Sig Dispense Refill  ? Ascorbic Acid (VITAMIN C PO) Take 1,000 mg by mouth daily.     ? Calcium Carb-Cholecalciferol (CALCIUM 1000 + D PO) Take 1 tablet by mouth daily.     ? ferrous sulfate 325 (65 FE) MG tablet Take 325 mg by mouth daily with breakfast.    ? ibuprofen (ADVIL) 200 MG tablet Take 400 mg by mouth every 6 (six) hours as needed for moderate pain.    ? levothyroxine (SYNTHROID, LEVOTHROID) 50 MCG tablet Take 1 tablet (50 mcg total) by mouth daily. 90 tablet 4  ? Multiple Vitamin (MULTIVITAMIN PO) Take 1 tablet by mouth daily.    ? vitamin B-12 (CYANOCOBALAMIN) 1000 MCG tablet Take 1,000 mcg by mouth daily.    ? ?No current facility-administered medications for this visit.  ?  ? ?ALLERGIES: Patient has no known allergies. ? ?Family History  ?Problem Relation Age of Onset  ? Hypertension Father   ? Heart disease Father   ? Cancer Maternal Grandmother   ?     PANCREATIC  ? Heart disease Maternal Grandfather   ? Cancer Maternal Grandfather   ?     lung cancer  ? Diabetes Paternal Grandfather   ? Cancer Maternal Uncle 60  ?     Colon  ? Other Mother   ?     Brain tumor benign  ? Dementia Maternal Uncle   ? Dementia Paternal Grandmother   ? Stroke Paternal Uncle   ? Breast cancer Neg Hx   ? ? ?Social History  ? ?Socioeconomic History  ? Marital status: Married  ?  Spouse name: Not on file  ? Number of children: 2  ? Years of education: Not on file  ? Highest education level: Not on file  ?Occupational History  ? Not on file  ?Tobacco Use  ? Smoking status: Never  ? Smokeless tobacco: Never  ?Vaping Use  ? Vaping Use: Never used  ?Substance and Sexual Activity  ? Alcohol use: No  ?  Alcohol/week: 0.0 standard drinks  ? Drug use: No  ? Sexual activity: Yes  ?  Birth control/protection: Post-menopausal  ?  Comment: 1st intercourse 65 yo-2 partners  ?Other Topics Concern  ? Not on file  ?Social History Narrative  ? Not on file  ? ?Social Determinants of Health  ? ?Financial Resource  Strain: Not on file  ?Food Insecurity: Not on file  ?Transportation Needs: Not on file  ?Physical Activity: Not on file  ?Stress: Not on file  ?Social Connections: Not on file  ?Intimate Partner Violence: Not on file  ? ? ?Review of Systems  ?All other systems reviewed and are negative. ? ?  PHYSICAL EXAMINATION:   ? ?BP 122/74   Pulse 66   Ht 5' 5.5" (1.664 m)   Wt 127 lb (57.6 kg)   LMP 11/22/2013   SpO2 99%   BMI 20.81 kg/m?     ?General appearance: alert, cooperative and appears stated age ?Abdomen: soft, non-tender; non distended, no masses,  no organomegaly ? ?Pelvic: External genitalia:  no lesions ?             Urethra:  normal appearing urethra with no masses, tenderness or lesions ?             Bartholins and Skenes: normal    ?             Vagina: normal appearing vagina with normal color and discharge, no lesions ?             Cervix: no lesions ?             Bimanual Exam:  Uterus:  normal size, contour, position, consistency, mobility, non-tender ?             Adnexa: no mass, fullness, tenderness ?             Rectovaginal: Yes.  .  Confirms. ?             Anus:  normal sphincter tone, no lesions ? ?Chaperone was present for exam. ? ?1. RLQ abdominal pain ?Normal exam ?- US PELVIS TRANSVAGINAL NON-OB (TV ONLY); Future ? ?2. Postmenopausal bleeding ?- US PELVIS TRANSVAGINAL NON-OB (TV ONLY); Future ?- Cytology - PAP ? ?3. Adnexal mass ?Long term stable mass. Prior normal CA 125 ?- US PELVIS TRANSVAGINAL NON-OB (TV ONLY); Future ? ?4. Screening for cervical cancer ?- Cytology - PAP ? ?

## 2021-08-10 ENCOUNTER — Ambulatory Visit (INDEPENDENT_AMBULATORY_CARE_PROVIDER_SITE_OTHER): Payer: BC Managed Care – PPO

## 2021-08-10 ENCOUNTER — Ambulatory Visit (INDEPENDENT_AMBULATORY_CARE_PROVIDER_SITE_OTHER): Payer: BC Managed Care – PPO | Admitting: Obstetrics and Gynecology

## 2021-08-10 VITALS — BP 110/72 | HR 66 | Wt 125.8 lb

## 2021-08-10 DIAGNOSIS — N950 Postmenopausal bleeding: Secondary | ICD-10-CM | POA: Diagnosis not present

## 2021-08-10 DIAGNOSIS — N9489 Other specified conditions associated with female genital organs and menstrual cycle: Secondary | ICD-10-CM

## 2021-08-10 DIAGNOSIS — R1031 Right lower quadrant pain: Secondary | ICD-10-CM

## 2021-08-10 LAB — CYTOLOGY - PAP
Comment: NEGATIVE
Diagnosis: NEGATIVE
High risk HPV: NEGATIVE

## 2021-08-10 NOTE — Progress Notes (Signed)
GYNECOLOGY  VISIT ?  ?HPI: ?61 y.o.   Married White or Caucasian Not Hispanic or Latino  female   ?G2P2 with Patient's last menstrual period was 11/22/2013.   ?here for evaluation of RLQ pain and PMP spotting. ? ?She has a h/o pancreatic cancer, diagnosed in 6/21.S/P chemo and Whipple surgery. She has a f/u CT in May. ?  ?She has a known right adnexal mass, stable for 4 years at the time of pelvic ultrasound in 7/22, not seen on CT in 11/22.  ? ?She had a normal pelvic exam and a normal pap on 08/05/21.  ? ?GYNECOLOGIC HISTORY: ?Patient's last menstrual period was 11/22/2013. ?Contraception:PMP ?Menopausal hormone therapy: no ?       ?OB History   ? ? Gravida  ?2  ? Para  ?2  ? Term  ?   ? Preterm  ?   ? AB  ?   ? Living  ?2  ?  ? ? SAB  ?   ? IAB  ?   ? Ectopic  ?   ? Multiple  ?   ? Live Births  ?   ?   ?  ?  ?    ? ?Patient Active Problem List  ? Diagnosis Date Noted  ? Malnutrition of moderate degree 03/26/2020  ? Port-A-Cath in place 02/03/2020  ? Genetic testing 11/28/2019  ? Family history of pancreatic cancer   ? Family history of colon cancer   ? Pancreatic cancer ([place]) 10/16/2019  ? Hypothyroid 12/10/2012  ? Diverticulosis 12/10/2012  ? Annual physical exam 12/10/2012  ? Screening examination for infectious disease 12/10/2012  ? ? ?Past Medical History:  ?Diagnosis Date  ? Cataracts, bilateral   ? Diverticulosis 12/07/2001  ? found on colonoscopy  ? Family history of colon cancer   ? Family history of pancreatic cancer   ? Hypothyroid 10/2007  ? Macular hole   ? pancreatic ca 08/2019  ? pancreatic  ? Pancreatic mass   ? PONV (postoperative nausea and vomiting)   ? ? ?Past Surgical History:  ?Procedure Laterality Date  ? APPENDECTOMY N/A 03/24/2020  ? Procedure: APPENDECTOMY;  Surgeon: Stark Klein, MD;  Location: [place];  Service: General;  Laterality: N/A;  ? BILIARY STENT PLACEMENT  10/10/2019  ? Procedure: BILIARY STENT PLACEMENT;  Surgeon: Arta Silence, MD;  Location: [place];  Service:  Endoscopy;;  ? CATARACT EXTRACTION, BILATERAL    ? COLONOSCOPY    ? ERCP N/A 10/10/2019  ? Procedure: ENDOSCOPIC RETROGRADE CHOLANGIOPANCREATOGRAPHY (ERCP);  Surgeon: Arta Silence, MD;  Location: [facility] ENDOSCOPY;  Service: Endoscopy;  Laterality: N/A;  ? ESOPHAGOGASTRODUODENOSCOPY N/A 10/10/2019  ? Procedure: ESOPHAGOGASTRODUODENOSCOPY (EGD);  Surgeon: Arta Silence, MD;  Location: [facility] ENDOSCOPY;  Service: Endoscopy;  Laterality: N/A;  ? EUS N/A 10/10/2019  ? Procedure: UPPER ENDOSCOPIC ULTRASOUND (EUS) LINEAR;  Surgeon: Arta Silence, MD;  Location: [place];  Service: Endoscopy;  Laterality: N/A;  ? EYE SURGERY    ? FINE NEEDLE ASPIRATION  10/10/2019  ? Procedure: FINE NEEDLE ASPIRATION (FNA) LINEAR;  Surgeon: Arta Silence, MD;  Location: [place];  Service: Endoscopy;;  ? IR IMAGING GUIDED PORT INSERTION  10/25/2019  ? LAPAROSCOPY N/A 03/24/2020  ? Procedure: LAPAROSCOPY DIAGNOSTIC;  Surgeon: Stark Klein, MD;  Location: [place];  Service: General;  Laterality: N/A;  ? Macular hole surg    ? PANCREATIC STENT PLACEMENT  10/10/2019  ? Procedure: PANCREATIC STENT PLACEMENT;  Surgeon: Arta Silence, MD;  Location: [place];  Service: Endoscopy;;  ?  SPHINCTEROTOMY  10/10/2019  ? Procedure: SPHINCTEROTOMY;  Surgeon: Arta Silence, MD;  Location: [place];  Service: Endoscopy;;  ? TONSILLECTOMY    ? TONSILLECTOMY    ? WHIPPLE PROCEDURE N/A 03/24/2020  ? Procedure: WHIPPLE PROCEDURE;  Surgeon: Stark Klein, MD;  Location: [place];  Service: General;  Laterality: N/A;  ? ? ?Current Outpatient Medications  ?Medication Sig Dispense Refill  ? Ascorbic Acid (VITAMIN C PO) Take 1,000 mg by mouth daily.     ? Calcium Carb-Cholecalciferol (CALCIUM 1000 + D PO) Take 1 tablet by mouth daily.     ? ferrous sulfate 325 (65 FE) MG tablet Take 325 mg by mouth daily with breakfast.    ? ibuprofen (ADVIL) 200 MG tablet Take 400 mg by mouth every 6 (six) hours as needed for moderate pain.    ? levothyroxine (SYNTHROID,  LEVOTHROID) 50 MCG tablet Take 1 tablet (50 mcg total) by mouth daily. 90 tablet 4  ? Multiple Vitamin (MULTIVITAMIN PO) Take 1 tablet by mouth daily.    ? vitamin B-12 (CYANOCOBALAMIN) 1000 MCG tablet Take 1,000 mcg by mouth daily.    ? ?No current facility-administered medications for this visit.  ?  ? ?ALLERGIES: Patient has no known allergies. ? ?Family History  ?Problem Relation Age of Onset  ? Hypertension Father   ? Heart disease Father   ? Cancer Maternal Grandmother   ?     PANCREATIC  ? Heart disease Maternal Grandfather   ? Cancer Maternal Grandfather   ?     lung cancer  ? Diabetes Paternal Grandfather   ? Cancer Maternal Uncle 60  ?     Colon  ? Other Mother   ?     Brain tumor benign  ? Dementia Maternal Uncle   ? Dementia Paternal Grandmother   ? Stroke Paternal Uncle   ? Breast cancer Neg Hx   ? ? ?Social History  ? ?Socioeconomic History  ? Marital status: Married  ?  Spouse name: Not on file  ? Number of children: 2  ? Years of education: Not on file  ? Highest education level: Not on file  ?Occupational History  ? Not on file  ?Tobacco Use  ? Smoking status: Never  ? Smokeless tobacco: Never  ?Vaping Use  ? Vaping Use: Never used  ?Substance and Sexual Activity  ? Alcohol use: No  ?  Alcohol/week: 0.0 standard drinks  ? Drug use: No  ? Sexual activity: Yes  ?  Birth control/protection: Post-menopausal  ?  Comment: 1st intercourse 52 yo-2 partners  ?Other Topics Concern  ? Not on file  ?Social History Narrative  ? Not on file  ? ?Social Determinants of Health  ? ?Financial Resource Strain: Not on file  ?Food Insecurity: Not on file  ?Transportation Needs: Not on file  ?Physical Activity: Not on file  ?Stress: Not on file  ?Social Connections: Not on file  ?Intimate Partner Violence: Not on file  ? ? ?ROS ? ?PHYSICAL EXAMINATION:   ? ?LMP 11/22/2013     ?General appearance: alert, cooperative and appears stated age ? ?Pelvic ultrasound ? ?Indications: PMP spotting ? ?Findings: ? ?Uterus 5.33 x 4.08  x 2.83 cm, anteverted ? ?Endometrium 1.55 mm, thin, symmetrical  ? ?Left ovary 1.99 x 1.07 x 1.46 cm, atrophic, + perfusion ? ?Right ovary 2.82 x 1.69 x 2.60 cm ?1.3 x 0.9 cm follicle ?1.1 x 0.8 cm complex cystic area, avascular, stable ?0.8 x 0.6 cm solid, vascular area,  stable.  ? ? ?No free fluid ? ?Impression:  ?Small anteverted uterus ?Thin, symmetrical endometrium ?Stable right adnexal mass  ?Atrophic left ovary ? ?1. Postmenopausal bleeding ?Thin endometrial stripe, normal exam, normal pap ?Call with further bleeding ? ?2. Adnexal mass ?Stable now for almost 5 years. She is getting regular CT scans because of her history of pancreatic cancer, will hold on further ultrasounds for now.  ? ?

## 2021-08-13 ENCOUNTER — Telehealth: Payer: Self-pay | Admitting: Obstetrics and Gynecology

## 2021-08-13 NOTE — Telephone Encounter (Signed)
Mychart message sent.

## 2021-08-23 DIAGNOSIS — R059 Cough, unspecified: Secondary | ICD-10-CM | POA: Diagnosis not present

## 2021-08-23 DIAGNOSIS — R062 Wheezing: Secondary | ICD-10-CM | POA: Diagnosis not present

## 2021-09-02 ENCOUNTER — Other Ambulatory Visit: Payer: Self-pay

## 2021-09-02 ENCOUNTER — Ambulatory Visit (HOSPITAL_COMMUNITY)
Admission: RE | Admit: 2021-09-02 | Discharge: 2021-09-02 | Disposition: A | Discharge status: Home / Self Care | Payer: BC Managed Care – PPO | Source: Ambulatory Visit | Attending: Hematology | Admitting: Hematology

## 2021-09-02 ENCOUNTER — Inpatient Hospital Stay: Payer: BC Managed Care – PPO | Attending: Hematology

## 2021-09-02 DIAGNOSIS — Z8507 Personal history of malignant neoplasm of pancreas: Secondary | ICD-10-CM | POA: Diagnosis not present

## 2021-09-02 DIAGNOSIS — R220 Localized swelling, mass and lump, head: Secondary | ICD-10-CM | POA: Insufficient documentation

## 2021-09-02 DIAGNOSIS — Z9889 Other specified postprocedural states: Secondary | ICD-10-CM | POA: Diagnosis not present

## 2021-09-02 DIAGNOSIS — C250 Malignant neoplasm of head of pancreas: Secondary | ICD-10-CM

## 2021-09-02 DIAGNOSIS — Z95828 Presence of other vascular implants and grafts: Secondary | ICD-10-CM

## 2021-09-02 DIAGNOSIS — Z08 Encounter for follow-up examination after completed treatment for malignant neoplasm: Secondary | ICD-10-CM | POA: Insufficient documentation

## 2021-09-02 DIAGNOSIS — R590 Localized enlarged lymph nodes: Secondary | ICD-10-CM | POA: Diagnosis not present

## 2021-09-02 DIAGNOSIS — K5730 Diverticulosis of large intestine without perforation or abscess without bleeding: Secondary | ICD-10-CM | POA: Diagnosis not present

## 2021-09-02 LAB — CMP (CANCER CENTER ONLY)
ALT: 25 U/L (ref 0–44)
AST: 30 U/L (ref 15–41)
Albumin: 4.4 g/dL (ref 3.5–5.0)
Alkaline Phosphatase: 80 U/L (ref 38–126)
Anion gap: 7 (ref 5–15)
BUN: 6 mg/dL (ref 6–20)
CO2: 30 mmol/L (ref 22–32)
Calcium: 9.6 mg/dL (ref 8.9–10.3)
Chloride: 102 mmol/L (ref 98–111)
Creatinine: 0.58 mg/dL (ref 0.44–1.00)
GFR, Estimated: >60 mL/min (ref >60)
Glucose, Bld: 103 mg/dL — ABNORMAL HIGH (ref 70–99)
Potassium: 4.1 mmol/L (ref 3.5–5.1)
Sodium: 139 mmol/L (ref 135–145)
Total Bilirubin: 0.5 mg/dL (ref 0.3–1.2)
Total Protein: 7 g/dL (ref 6.5–8.1)

## 2021-09-02 LAB — CBC WITH DIFFERENTIAL (CANCER CENTER ONLY)
Abs Immature Granulocytes: 0.01 10*3/uL (ref 0.00–0.07)
Basophils Absolute: 0 10*3/uL (ref 0.0–0.1)
Basophils Relative: 1 %
Eosinophils Absolute: 0.2 10*3/uL (ref 0.0–0.5)
Eosinophils Relative: 3 %
HCT: 40.3 % (ref 36.0–46.0)
Hemoglobin: 13.6 g/dL (ref 12.0–15.0)
Immature Granulocytes: 0 %
Lymphocytes Relative: 41 %
Lymphs Abs: 2.4 10*3/uL (ref 0.7–4.0)
MCH: 30.6 pg (ref 26.0–34.0)
MCHC: 33.7 g/dL (ref 30.0–36.0)
MCV: 90.8 fL (ref 80.0–100.0)
Monocytes Absolute: 0.7 10*3/uL (ref 0.1–1.0)
Monocytes Relative: 12 %
Neutro Abs: 2.6 10*3/uL (ref 1.7–7.7)
Neutrophils Relative %: 43 %
Platelet Count: 175 10*3/uL (ref 150–400)
RBC: 4.44 MIL/uL (ref 3.87–5.11)
RDW: 13.2 % (ref 11.5–15.5)
WBC Count: 5.8 10*3/uL (ref 4.0–10.5)
nRBC: 0 % (ref 0.0–0.2)

## 2021-09-02 MED ORDER — HEPARIN SOD (PORK) LOCK FLUSH 100 UNIT/ML IV SOLN
INTRAVENOUS | Status: AC
Start: 2021-09-02 — End: 2021-09-02
  Filled 2021-09-02: qty 5

## 2021-09-02 MED ORDER — SODIUM CHLORIDE 0.9% FLUSH
10.0000 mL | Freq: Once | INTRAVENOUS | Status: AC
  Administered 2021-09-02: 10 mL

## 2021-09-02 MED ORDER — IOHEXOL 300 MG/ML  SOLN
100.0000 mL | Freq: Once | INTRAMUSCULAR | Status: AC | PRN
  Administered 2021-09-02: 100 mL via INTRAVENOUS

## 2021-09-02 MED ORDER — SODIUM CHLORIDE (PF) 0.9 % IJ SOLN
INTRAMUSCULAR | Status: AC
  Filled 2021-09-02: qty 50

## 2021-09-03 LAB — CANCER ANTIGEN 19-9: CA 19-9: 45 U/mL — ABNORMAL HIGH (ref 0–35)

## 2021-09-06 ENCOUNTER — Other Ambulatory Visit: Payer: Self-pay

## 2021-09-06 ENCOUNTER — Inpatient Hospital Stay: Payer: BC Managed Care – PPO | Admitting: Hematology

## 2021-09-06 VITALS — BP 125/70 | HR 89 | Temp 98.4°F | Resp 18 | Ht 65.5 in | Wt 127.6 lb

## 2021-09-06 DIAGNOSIS — C250 Malignant neoplasm of head of pancreas: Secondary | ICD-10-CM | POA: Diagnosis not present

## 2021-09-06 DIAGNOSIS — R220 Localized swelling, mass and lump, head: Secondary | ICD-10-CM | POA: Diagnosis not present

## 2021-09-06 DIAGNOSIS — Z08 Encounter for follow-up examination after completed treatment for malignant neoplasm: Secondary | ICD-10-CM | POA: Diagnosis not present

## 2021-09-06 DIAGNOSIS — Z8507 Personal history of malignant neoplasm of pancreas: Secondary | ICD-10-CM | POA: Diagnosis not present

## 2021-09-06 NOTE — Progress Notes (Signed)
?[place]   ?Telephone:(336) [phone] [phone]   ?Clinic Follow up Note  ? ?Patient Care Team: ?Vernie Shanks, MD as PCP - General (Family Medicine) ?Fontaine, Belinda Block, MD (Inactive) as Consulting Physician (Gynecology) ?Truitt Merle, MD as Consulting Physician (Oncology) ?Arta Silence, MD as Consulting Physician (Gastroenterology) ?Stark Klein, MD as Consulting Physician (General Surgery) ?Karie Mainland, RD as Dietitian (Nutrition) ? ?Date of Service:  09/06/2021 ? ?CHIEF COMPLAINT: f/u of pancreatic cancer ? ?CURRENT THERAPY:  ?Surveillance ? ?ASSESSMENT & PLAN:  ?Breanna Noble is a 61 y.o. female with  ? ?1. Pancreatic Cancer, Stage IIB, cT2N1M0, ypT1N1 ?-diagnosed in 10/2019, initial US showed bile duct and gallbladder dilation due to 2-3cm mass at head of pancreas/Uncinate. MRI showed mass is close to SMV but not involving it. CT CAP from 10/23/19 showed borderline potential adenopathy in the periaortic region, no definite vascular involvement or distant metastasis.  ?-She underwent EUS and ERCP on 10/10/19 and had 2 stents placed by Dr Paulita Fujita. Pancreatic mass biopsy showed Adenocarcinoma of Pancreas. Endoscopic staging T2N1, no evidence of vascular involvement. ?-baseline CA 19-9 on 10/16/19 was elevated at 883. ?-She completed 8 cycles of neoadjuvant FOLFIRINOX 11/09/19 - 02/20/20, tolerated moderately well ?-s/p Whipple surgery with Dr Barry Dienes on 03/24/20. Path showed a residual 1 cm moderately differentiated adenocarcinoma, and 1 out of 14 positive lymph nodes.  Surgical margins were negative.  ?-She completed 4 cycles of adjuvant chemo Gemcitabine/Xeloda 05/19/20 - 08/04/20. She is currently on surveillance.  ?-restaging CT AP on 09/02/21 showed NED.  I personally reviewed her imaging and discussed the results with her. ?-CA 19.9 has been mildly elevated and fluctuates but is overall stable, most recently slightly elevated at 45 on 09/02/21.  Given the degree of mild elevation, I think  this is likely nonspecific.  I reviewed this and her scan results with them today. ?-She is clinically doing very well. Labs from 09/02/21 reviewed, CBC and CMP WNL.  ?-she is scheduled for f/u with Dr. Barry Dienes on 09/13/21. I advised her to postpone her visit with Dr. Barry Dienes by 3 months and that she can discuss port removal with Dr. Barry Dienes at their appointment.  ?  ?2. Genetic Testing in 10/2019 was negative for pathogenetic mutations. ?  ?3. Right Adnexal Mass ?-mass has been stable over 5 years of imaging, most recently on pelvic US on 08/10/21. ?-PAP on 08/05/21 was negative. ?  ?  ?PLAN:  ?-Lab and CT scan reviewed, NED.   ?-Okay to postpone her f/u with Dr. Barry Dienes on 09/13/21 for 3 months, and remove her port after next visit, I sent a message to Dr. Barry Dienes. ?-port flush in 6 and 12 weeks ?-lab in 12 weeks ?-lab and f/u in 6 months ? ? ?No problem-specific Assessment & Plan notes found for this encounter. ? ? ?SUMMARY OF ONCOLOGIC HISTORY: ?Oncology History Overview Note  ?Cancer Staging ?Pancreatic cancer ([place]) ?Staging form: Exocrine Pancreas, AJCC 8th Edition ?- Clinical stage from 10/16/2019: Stage IIB (cT2, cN1, cM0) - Signed by Truitt Merle, MD on 10/16/2019 ? ?  ?Pancreatic cancer [facility])  ?10/03/2019 Imaging  ? US Abdomen 10/03/19  ?IMPRESSION: ?Markedly dilated bile ducts. Dilated gallbladder with sludge but no ?gallstones. ?  ?2.9 cm mass in the uncinate process most likely pancreatic neoplasm ?causing biliary obstruction. Recommend MRI of the pancreas without ?with contrast for further evaluation. If the patient cannot have ?MRI, CT of the pancreas with contrast recommended. ?  ?10/07/2019 Imaging  ? MRI  Abdomen 10/07/19  ?IMPRESSION: ?1. There is a hypoenhancing mass of the pancreatic uncinate with ?abrupt obstruction of the central common bile duct, measuring ?approximately 2.9 cm, consistent with pancreatic adenocarcinoma. ?2. Gross intra and extrahepatic biliary ductal dilatation, common ?bile duct measuring 1.4 cm.  Gallbladder hydrops. ?3. The pancreatic duct is nondilated. ?4. Pancreatic mass lies closely adjacent to the most central ?portions of the superior mesenteric vein and central portal vein, ?due to motion artifact it is difficult to clearly discern whether ?there is a preserved fat plane. Multiphasic contrast enhanced ?pancreatic protocol CT may be less motion sensitive and better ?delineate vascular structures for the purposes of surgical planning ?if necessary. ?5. No evidence of lymphadenopathy or metastatic disease in the ?abdomen. ?  ?10/10/2019 Procedure  ? EUS by Dr Paulita Fujita 10/10/19  ?IMPRESSION ?- A mass was identified at junction of head/uncinate process of the pancreas. This was staged T2 N1 Mx by endosonographic criteria. Fine needle aspiration performed. ?- A few abnormal lymph nodes were visualized in the peripancreatic region. ?- There was dilation in the common bile duct which measured up to 14 mm. ?- Hyperechoic material consistent with sludge was visualized endosonographically in the ?gallbladder. ?  ?10/10/2019 Procedure  ? ERCP by Dr Paulita Fujita 10/10/19  ?IMPRESSION ?- The major papilla appeared normal. ?- One temporary stent was placed into the ventral pancreatic duct. ?- A biliary sphincterotomy was performed. ?- One covered metal stent was placed into the common bile duct. ?  ?10/10/2019 Initial Biopsy  ? FINAL MICROSCOPIC DIAGNOSIS: 10/10/19 ? ?A. PANCREAS, HEAD, FINE NEEDLE ASPIRATION:  ?- Malignant cells present, consistent with adenocarcinoma  ?  ?10/16/2019 Initial Diagnosis  ? Pancreatic cancer ([place]) ? ?  ?10/16/2019 Cancer Staging  ? Staging form: Exocrine Pancreas, AJCC 8th Edition ?- Clinical stage from 10/16/2019: Stage IIB (cT2, cN1, cM0) - Signed by Truitt Merle, MD on 10/16/2019 ? ?  ?10/23/2019 Imaging  ? CT CAP W contrast  ?IMPRESSION: ?1. Pancreatic mass with local adenopathy and potential ?retroperitoneal adenopathy. Given retroperitoneal lymph nodes and ?presence of venous collaterals PET scan  may be useful for staging ?purposes. ?2. No definite vascular involvement or signs of upper abdominal ?venous collaterals. Some stranding around the celiac may be due to ?recent biopsy. ?3. Borderline enlarged RIGHT external iliac lymph node. Attention on ?follow-up. ?4. No evidence of metastatic disease in the chest. ?5. Signs of recent ERCP and stent placement. ?  ?10/25/2019 Procedure  ? PAC placed  ?  ?11/09/2019 - 02/20/2020 Chemotherapy  ? Neoadjuvant FOLFIRINOX q2weeks starting 11/09/19-02/20/20 for 8 cycles ?  ?11/28/2019 Genetic Testing  ? Negative genetic testing on the common hereditary cancer panel, pancreatic cancer panel, preliminary evidence pancreatic cancer panel and chronic pancreatitis panel.  The Common Hereditary Gene Panel offered by [facility] includes sequencing and/or deletion duplication testing of the following 55 genes: APC*, ATM*, AXIN2, BARD1, BMPR1A, BRCA1, BRCA2, BRIP1, CASR, CDH1, CDK4, CDKN2A (p14ARF), CDKN2A (p16INK4a), CFTR*, CHEK2, CPA1, CTNNA1, CTRC, DICER1*, EPCAM*, FANCC, GREM1*, HOXB13, KIT, MEN1*, MLH1*, MSH2*, MSH3*, MSH6*, MUTYH, NBN, NF1*, NTHL1, PALB2, PALLD, PDGFRA, PMS2*, POLD1*, POLE, PRSS1*, PTEN*, RAD50, RAD51C, RAD51D, SDHA*, SDHB, SDHC*, SDHD, SMAD4, SMARCA4, SPINK1, STK11, TP53, TSC1*, TSC2, and VHL .  The report date is November 28, 2019. ?  ?01/28/2020 Imaging  ? CT AP w contrast  ?IMPRESSION: ?1. No substantial interval change in exam. ?2. Similar appearance of the pancreatic mass with associated main ?pancreatic ductal dilatation. ?3. Soft tissue stranding again noted around the celiac axis, in the ?para-aortic  retroperitoneal space and along the IMA (new in the ?interval). These findings are associated with similar appearance of ?upper normal to borderline hepatoduodenal ligament and ?retroperitoneal lymphadenopathy. Close continued attention on ?follow-up recommended. ?4. Borderline enlarged right external iliac node identified on ?previous study has decreased in size  in the interval. ?  ?02/14/2020 PET scan  ? IMPRESSION: ?1. The pancreatic mass has a maximum SUV of 6.1. No findings of ?hypermetabolic local adenopathy or distant metastatic spread. ?2. Splenomegaly. ?3.

## 2021-09-07 ENCOUNTER — Encounter: Payer: Self-pay | Admitting: Hematology

## 2021-09-07 DIAGNOSIS — C259 Malignant neoplasm of pancreas, unspecified: Secondary | ICD-10-CM | POA: Diagnosis not present

## 2021-09-07 DIAGNOSIS — E039 Hypothyroidism, unspecified: Secondary | ICD-10-CM | POA: Diagnosis not present

## 2021-09-07 DIAGNOSIS — R7309 Other abnormal glucose: Secondary | ICD-10-CM | POA: Diagnosis not present

## 2021-09-08 ENCOUNTER — Other Ambulatory Visit: Payer: Self-pay | Admitting: Family Medicine

## 2021-09-08 DIAGNOSIS — Z1231 Encounter for screening mammogram for malignant neoplasm of breast: Secondary | ICD-10-CM

## 2021-10-18 ENCOUNTER — Inpatient Hospital Stay: Payer: BC Managed Care – PPO | Attending: Hematology

## 2021-10-18 ENCOUNTER — Other Ambulatory Visit: Payer: Self-pay

## 2021-10-18 DIAGNOSIS — Z8507 Personal history of malignant neoplasm of pancreas: Secondary | ICD-10-CM | POA: Insufficient documentation

## 2021-10-18 DIAGNOSIS — Z452 Encounter for adjustment and management of vascular access device: Secondary | ICD-10-CM | POA: Insufficient documentation

## 2021-10-18 DIAGNOSIS — Z95828 Presence of other vascular implants and grafts: Secondary | ICD-10-CM

## 2021-10-18 MED ORDER — HEPARIN SOD (PORK) LOCK FLUSH 100 UNIT/ML IV SOLN
500.0000 [IU] | Freq: Once | INTRAVENOUS | Status: AC
  Administered 2021-10-18: 500 [IU]

## 2021-10-18 MED ORDER — SODIUM CHLORIDE 0.9% FLUSH
10.0000 mL | Freq: Once | INTRAVENOUS | Status: AC
  Administered 2021-10-18: 10 mL

## 2021-10-26 ENCOUNTER — Encounter: Payer: Self-pay | Admitting: Nurse Practitioner

## 2021-10-26 ENCOUNTER — Ambulatory Visit
Admission: RE | Admit: 2021-10-26 | Discharge: 2021-10-26 | Disposition: A | Discharge status: Home / Self Care | Payer: BC Managed Care – PPO | Source: Ambulatory Visit | Attending: Family Medicine | Admitting: Family Medicine

## 2021-10-26 ENCOUNTER — Ambulatory Visit (INDEPENDENT_AMBULATORY_CARE_PROVIDER_SITE_OTHER): Payer: BC Managed Care – PPO | Admitting: Nurse Practitioner

## 2021-10-26 VITALS — BP 118/76 | Ht 66.0 in | Wt 129.0 lb

## 2021-10-26 DIAGNOSIS — N951 Menopausal and female climacteric states: Secondary | ICD-10-CM | POA: Diagnosis not present

## 2021-10-26 DIAGNOSIS — Z780 Asymptomatic menopausal state: Secondary | ICD-10-CM

## 2021-10-26 DIAGNOSIS — Z01419 Encounter for gynecological examination (general) (routine) without abnormal findings: Secondary | ICD-10-CM

## 2021-10-26 DIAGNOSIS — Z1231 Encounter for screening mammogram for malignant neoplasm of breast: Secondary | ICD-10-CM | POA: Diagnosis not present

## 2021-10-26 MED ORDER — ESTRADIOL 0.1 MG/GM VA CREA: 1.0000 | TOPICAL_CREAM | VAGINAL | 3 refills | Status: DC

## 2021-11-16 ENCOUNTER — Other Ambulatory Visit: Payer: Self-pay | Admitting: Nurse Practitioner

## 2021-11-16 ENCOUNTER — Ambulatory Visit (INDEPENDENT_AMBULATORY_CARE_PROVIDER_SITE_OTHER): Payer: BC Managed Care – PPO

## 2021-11-16 DIAGNOSIS — Z780 Asymptomatic menopausal state: Secondary | ICD-10-CM | POA: Diagnosis not present

## 2021-11-16 DIAGNOSIS — Z13820 Encounter for screening for osteoporosis: Secondary | ICD-10-CM

## 2021-11-22 ENCOUNTER — Encounter: Payer: Self-pay | Admitting: *Deleted

## 2021-11-22 ENCOUNTER — Other Ambulatory Visit: Payer: Self-pay

## 2021-11-24 ENCOUNTER — Telehealth: Payer: Self-pay | Admitting: *Deleted

## 2021-11-24 NOTE — Telephone Encounter (Signed)
Patient called stating she received her bone density result via my chart and it states she has history of breast cancer. Patient said that information is not correct, I informed Sharee Pimple, RN and she then asked Ivin Booty bone density tech to correct error. Patient aware of this.

## 2021-11-29 ENCOUNTER — Other Ambulatory Visit: Payer: Self-pay

## 2021-11-29 ENCOUNTER — Inpatient Hospital Stay: Payer: BC Managed Care – PPO | Attending: Hematology

## 2021-11-29 DIAGNOSIS — Z452 Encounter for adjustment and management of vascular access device: Secondary | ICD-10-CM | POA: Insufficient documentation

## 2021-11-29 DIAGNOSIS — Z8507 Personal history of malignant neoplasm of pancreas: Secondary | ICD-10-CM | POA: Insufficient documentation

## 2021-11-29 DIAGNOSIS — C250 Malignant neoplasm of head of pancreas: Secondary | ICD-10-CM

## 2021-11-29 DIAGNOSIS — Z95828 Presence of other vascular implants and grafts: Secondary | ICD-10-CM

## 2021-11-29 MED ORDER — HEPARIN SOD (PORK) LOCK FLUSH 100 UNIT/ML IV SOLN
500.0000 [IU] | Freq: Once | INTRAVENOUS | Status: AC
  Administered 2021-11-29: 500 [IU]

## 2021-11-29 MED ORDER — SODIUM CHLORIDE 0.9% FLUSH
10.0000 mL | Freq: Once | INTRAVENOUS | Status: AC
  Administered 2021-11-29: 10 mL

## 2021-11-30 ENCOUNTER — Other Ambulatory Visit: Payer: Self-pay

## 2021-11-30 LAB — CANCER ANTIGEN 19-9: CA 19-9: 55 U/mL — ABNORMAL HIGH (ref 0–35)

## 2021-12-02 ENCOUNTER — Other Ambulatory Visit: Payer: Self-pay

## 2021-12-06 DIAGNOSIS — C250 Malignant neoplasm of head of pancreas: Secondary | ICD-10-CM | POA: Diagnosis not present

## 2021-12-06 DIAGNOSIS — Z95828 Presence of other vascular implants and grafts: Secondary | ICD-10-CM | POA: Diagnosis not present

## 2021-12-12 ENCOUNTER — Other Ambulatory Visit: Payer: Self-pay | Admitting: Hematology

## 2021-12-12 DIAGNOSIS — C250 Malignant neoplasm of head of pancreas: Secondary | ICD-10-CM

## 2022-01-22 ENCOUNTER — Encounter: Payer: Self-pay | Admitting: Hematology

## 2022-03-09 ENCOUNTER — Inpatient Hospital Stay: Payer: BC Managed Care – PPO

## 2022-03-09 ENCOUNTER — Inpatient Hospital Stay: Payer: BC Managed Care – PPO | Attending: Hematology | Admitting: Hematology

## 2022-03-09 ENCOUNTER — Other Ambulatory Visit: Payer: Self-pay

## 2022-03-09 ENCOUNTER — Encounter: Payer: Self-pay | Admitting: Hematology

## 2022-03-09 VITALS — BP 119/61 | HR 62 | Temp 98.0°F | Resp 18 | Ht 66.0 in | Wt 126.9 lb

## 2022-03-09 DIAGNOSIS — C250 Malignant neoplasm of head of pancreas: Secondary | ICD-10-CM | POA: Diagnosis not present

## 2022-03-09 DIAGNOSIS — Z23 Encounter for immunization: Secondary | ICD-10-CM | POA: Diagnosis not present

## 2022-03-09 DIAGNOSIS — Z95828 Presence of other vascular implants and grafts: Secondary | ICD-10-CM

## 2022-03-09 LAB — COMPREHENSIVE METABOLIC PANEL
ALT: 22 U/L (ref 0–44)
AST: 27 U/L (ref 15–41)
Albumin: 4.1 g/dL (ref 3.5–5.0)
Alkaline Phosphatase: 78 U/L (ref 38–126)
Anion gap: 6 (ref 5–15)
BUN: 5 mg/dL — ABNORMAL LOW (ref 6–20)
CO2: 28 mmol/L (ref 22–32)
Calcium: 8.9 mg/dL (ref 8.9–10.3)
Chloride: 105 mmol/L (ref 98–111)
Creatinine, Ser: 0.96 mg/dL (ref 0.44–1.00)
GFR, Estimated: >60 mL/min (ref >60)
Glucose, Bld: 123 mg/dL — ABNORMAL HIGH (ref 70–99)
Potassium: 3.8 mmol/L (ref 3.5–5.1)
Sodium: 139 mmol/L (ref 135–145)
Total Bilirubin: 0.3 mg/dL (ref 0.3–1.2)
Total Protein: 6.4 g/dL — ABNORMAL LOW (ref 6.5–8.1)

## 2022-03-09 LAB — CBC WITH DIFFERENTIAL/PLATELET
Abs Immature Granulocytes: 0.02 10*3/uL (ref 0.00–0.07)
Basophils Absolute: 0 10*3/uL (ref 0.0–0.1)
Basophils Relative: 1 %
Eosinophils Absolute: 0.1 10*3/uL (ref 0.0–0.5)
Eosinophils Relative: 3 %
HCT: 39.1 % (ref 36.0–46.0)
Hemoglobin: 12.8 g/dL (ref 12.0–15.0)
Immature Granulocytes: 0 %
Lymphocytes Relative: 46 %
Lymphs Abs: 2.4 10*3/uL (ref 0.7–4.0)
MCH: 29.8 pg (ref 26.0–34.0)
MCHC: 32.7 g/dL (ref 30.0–36.0)
MCV: 91.1 fL (ref 80.0–100.0)
Monocytes Absolute: 0.6 10*3/uL (ref 0.1–1.0)
Monocytes Relative: 11 %
Neutro Abs: 2 10*3/uL (ref 1.7–7.7)
Neutrophils Relative %: 39 %
Platelets: 142 10*3/uL — ABNORMAL LOW (ref 150–400)
RBC: 4.29 MIL/uL (ref 3.87–5.11)
RDW: 13.5 % (ref 11.5–15.5)
WBC: 5.2 10*3/uL (ref 4.0–10.5)
nRBC: 0 % (ref 0.0–0.2)

## 2022-03-09 MED ORDER — SODIUM CHLORIDE 0.9% FLUSH
10.0000 mL | Freq: Once | INTRAVENOUS | Status: AC
  Administered 2022-03-09: 10 mL

## 2022-03-09 MED ORDER — INFLUENZA VAC SPLIT QUAD 0.5 ML IM SUSY
0.5000 mL | PREFILLED_SYRINGE | Freq: Once | INTRAMUSCULAR | Status: AC
  Administered 2022-03-09: 0.5 mL via INTRAMUSCULAR
  Filled 2022-03-09: qty 0.5

## 2022-03-09 MED ORDER — HEPARIN SOD (PORK) LOCK FLUSH 100 UNIT/ML IV SOLN
500.0000 [IU] | Freq: Once | INTRAVENOUS | Status: AC
  Administered 2022-03-09: 500 [IU]

## 2022-03-09 NOTE — Progress Notes (Signed)
[place]   [phone] [phone]   Clinic Follow up Note   Patient Care Team: Vernie Shanks, MD (Inactive) as PCP - General (Family Medicine) Truitt Merle, MD as Attending Physician (Hematology and Oncology) Arta Silence, MD as Consulting Physician (Gastroenterology) Stark Klein, MD as Consulting Physician (General Surgery) Karie Mainland, RD as Dietitian (Nutrition) Salvadore Dom, MD as Consulting Physician (Obstetrics and Gynecology)  Date of Service:  03/09/2022  CHIEF COMPLAINT: f/u of pancreatic cancer  CURRENT THERAPY:  Surveillance  ASSESSMENT & PLAN:  Breanna Noble is a 61 y.o. female with   1. Pancreatic Cancer, Stage IIB, cT2N1M0, ypT1N1 -diagnosed in 10/10/19 by EUS (with ERCP) by Dr Paulita Fujita. Pancreatic mass biopsy showed adenocarcinoma. Endoscopic staging T2N1, no evidence of vascular involvement. -baseline CA 19-9 on 10/16/19 was elevated at 883. -s/p 8 cycles of neoadjuvant FOLFIRINOX 11/09/19 - 02/20/20, tolerated moderately well -s/p Whipple surgery with Dr Barry Dienes on 03/24/20. Path showed 1 cm residual moderately differentiated adenocarcinoma, and 1/14 positive lymph nodes. Surgical margins were negative.  -s/p 4 cycles of adjuvant Gemcitabine/Xeloda 05/19/20 - 08/04/20. She is currently on surveillance.  -restaging CT AP on 09/02/21 showed NED. -She is clinically doing very well. Labs reviewed, CBC and CMP WNL, except slightly low protein and platelets. We will draw CA 19-9 today to monitor. -she will f/u with Dr. Barry Dienes in 06/2022. -plan for CT scan in 6 months; I ordered today.     PLAN:  -f/u with Dr. Barry Dienes in 3 months -port flush every 6 weeks -lab in 12 weeks -f/u in 6 months, with lab/flush and CT several days before   No problem-specific Assessment & Plan notes found for this encounter.   SUMMARY OF ONCOLOGIC HISTORY: Oncology History Overview Note  Cancer Staging Pancreatic cancer [facility]) Staging form:  Exocrine Pancreas, AJCC 8th Edition - Clinical stage from 10/16/2019: Stage IIB (cT2, cN1, cM0) - Signed by Truitt Merle, MD on 10/16/2019    Pancreatic cancer ([place])  10/03/2019 Imaging   US Abdomen 10/03/19  IMPRESSION: Markedly dilated bile ducts. Dilated gallbladder with sludge but no gallstones.   2.9 cm mass in the uncinate process most likely pancreatic neoplasm causing biliary obstruction. Recommend MRI of the pancreas without with contrast for further evaluation. If the patient cannot have MRI, CT of the pancreas with contrast recommended.   10/07/2019 Imaging   MRI Abdomen 10/07/19  IMPRESSION: 1. There is a hypoenhancing mass of the pancreatic uncinate with abrupt obstruction of the central common bile duct, measuring approximately 2.9 cm, consistent with pancreatic adenocarcinoma. 2. Gross intra and extrahepatic biliary ductal dilatation, common bile duct measuring 1.4 cm. Gallbladder hydrops. 3. The pancreatic duct is nondilated. 4. Pancreatic mass lies closely adjacent to the most central portions of the superior mesenteric vein and central portal vein, due to motion artifact it is difficult to clearly discern whether there is a preserved fat plane. Multiphasic contrast enhanced pancreatic protocol CT may be less motion sensitive and better delineate vascular structures for the purposes of surgical planning if necessary. 5. No evidence of lymphadenopathy or metastatic disease in the abdomen.   10/10/2019 Procedure   EUS by Dr Paulita Fujita 10/10/19  IMPRESSION - A mass was identified at junction of head/uncinate process of the pancreas. This was staged T2 N1 Mx by endosonographic criteria. Fine needle aspiration performed. - A few abnormal lymph nodes were visualized in the peripancreatic region. - There was dilation in the common bile duct which measured up to  14 mm. - Hyperechoic material consistent with sludge was visualized endosonographically in the gallbladder.   10/10/2019  Procedure   ERCP by Dr Paulita Fujita 10/10/19  IMPRESSION - The major papilla appeared normal. - One temporary stent was placed into the ventral pancreatic duct. - A biliary sphincterotomy was performed. - One covered metal stent was placed into the common bile duct.   10/10/2019 Initial Biopsy   FINAL MICROSCOPIC DIAGNOSIS: 10/10/19  A. PANCREAS, HEAD, FINE NEEDLE ASPIRATION:  - Malignant cells present, consistent with adenocarcinoma    10/16/2019 Initial Diagnosis   Pancreatic cancer ([place])   10/16/2019 Cancer Staging   Staging form: Exocrine Pancreas, AJCC 8th Edition - Clinical stage from 10/16/2019: Stage IIB (cT2, cN1, cM0) - Signed by Truitt Merle, MD on 10/16/2019   10/23/2019 Imaging   CT CAP W contrast  IMPRESSION: 1. Pancreatic mass with local adenopathy and potential retroperitoneal adenopathy. Given retroperitoneal lymph nodes and presence of venous collaterals PET scan may be useful for staging purposes. 2. No definite vascular involvement or signs of upper abdominal venous collaterals. Some stranding around the celiac may be due to recent biopsy. 3. Borderline enlarged RIGHT external iliac lymph node. Attention on follow-up. 4. No evidence of metastatic disease in the chest. 5. Signs of recent ERCP and stent placement.   10/25/2019 Procedure   PAC placed    11/09/2019 - 02/20/2020 Chemotherapy   Neoadjuvant FOLFIRINOX q2weeks starting 11/09/19-02/20/20 for 8 cycles   11/28/2019 Genetic Testing   Negative genetic testing on the common hereditary cancer panel, pancreatic cancer panel, preliminary evidence pancreatic cancer panel and chronic pancreatitis panel.  The Common Hereditary Gene Panel offered by [facility] includes sequencing and/or deletion duplication testing of the following 55 genes: APC*, ATM*, AXIN2, BARD1, BMPR1A, BRCA1, BRCA2, BRIP1, CASR, CDH1, CDK4, CDKN2A (p14ARF), CDKN2A (p16INK4a), CFTR*, CHEK2, CPA1, CTNNA1, CTRC, DICER1*, EPCAM*, FANCC, GREM1*, HOXB13, KIT,  MEN1*, MLH1*, MSH2*, MSH3*, MSH6*, MUTYH, NBN, NF1*, NTHL1, PALB2, PALLD, PDGFRA, PMS2*, POLD1*, POLE, PRSS1*, PTEN*, RAD50, RAD51C, RAD51D, SDHA*, SDHB, SDHC*, SDHD, SMAD4, SMARCA4, SPINK1, STK11, TP53, TSC1*, TSC2, and VHL .  The report date is November 28, 2019.   01/28/2020 Imaging   CT AP w contrast  IMPRESSION: 1. No substantial interval change in exam. 2. Similar appearance of the pancreatic mass with associated main pancreatic ductal dilatation. 3. Soft tissue stranding again noted around the celiac axis, in the para-aortic retroperitoneal space and along the IMA (new in the interval). These findings are associated with similar appearance of upper normal to borderline hepatoduodenal ligament and retroperitoneal lymphadenopathy. Close continued attention on follow-up recommended. 4. Borderline enlarged right external iliac node identified on previous study has decreased in size in the interval.   02/14/2020 PET scan   IMPRESSION: 1. The pancreatic mass has a maximum SUV of 6.1. No findings of hypermetabolic local adenopathy or distant metastatic spread. 2. Splenomegaly. 3. Inspissated barium or appendicolith within the appendix. No signs of appendiceal inflammation. 4. Scattered sigmoid colon diverticula. ADDENDUM: The original report was by Dr. Van Clines. The following addendum is by Dr. Van Clines:   On the CT from 01/28/2020 there were some upper normal sized/borderline retroperitoneal lymph nodes including a 0.8 cm aortocaval node on image 72 of series 9. This currently has a maximum SUV of 2.2, which is similar to blood pool activity. An indistinctly marginated lymph node just below the left renal vein has a maximum SUV of 2.4, just above blood pool levels., and measures about 1.0 cm in diameter. Lymph nodes anterior  to the left psoas muscle have maximum SUV of 2.0, similar to blood pool. Overall these lymph nodes are borderline enlarged and with  activity level at or just minimally above blood pool. While certainly not grossly positive, their proximity to the pancreatic head mass and borderline imaging characteristics make it difficult to exclude early nodal involvement by malignancy.   03/24/2020 Surgery   Whipple surgery, Laparoscopic Diagnostic, Appendectomy by Dr Barry Dienes    03/24/2020 Pathology Results   FINAL MICROSCOPIC DIAGNOSIS:   A. WHIPPLE PROCEDURE, WITH GALLBLADDER:  - Adenocarcinoma, moderately differentiated, spanning 1 cm.  - Tumor involves retroperitoneal peripancreatic soft tissue.  - Resection margins are negative.  - Treatment effect present.  - Rare tumor cells in one of fourteen lymph nodes (1/14).  - Benign gallbladder with chronic inflammation and benign lymph node.  - Bile duct with granulation tissue and reactive changes.  - Benign appendix.  - Mild reactive gastropathy.  - See oncology table.   B. LYMPH NODE, PORTAL, EXCISION:  - Three of three lymph nodes negative for carcinoma (0/3).   C. LYMPH NODE, COMMON HEPATIC ARTERY, EXCISION:  - Three of three lymph nodes negative for carcinoma (0/3).   D. APPENDIX, APPENDECTOMY:  - Benign appendix.   E. LYMPH NODE, AORTOCAVAL, EXCISION:  - One of one lymph node negative for carcinoma (0/1).    03/24/2020 Cancer Staging   Staging form: Exocrine Pancreas, AJCC 8th Edition - Pathologic stage from 03/24/2020: Stage IIB (pT1c, pN1, cM0) - Signed by Truitt Merle, MD on 04/16/2020   04/14/2020 Imaging   CT AP  IMPRESSION: 1. Interval Whipple procedure. Pancreatic stent in place. 2. Ill-defined soft tissue in the right abdominal wall extending from the subcutaneous tissues to the anterior abdominal wall musculature, but no focal fluid collection. This may represent postsurgical change, sterility indeterminate by imaging. 3. Pelvic anatomy is poorly defined, including pelvic bowel loops, adnexa, and colon. There appears to be sigmoid colonic  wall thickening as well as intraluminal fluid, with additional diffuse colonic wall thickening from the transverse colon distally. Findings are suggestive of generalized colitis. Attention to this area on follow-up is recommended. 4. Small amount of non organized free fluid in the right upper quadrant. No evidence of intra-abdominal abscess. 5. Periportal edema, nonspecific. Previous biliary stent has been removed. Common bile duct is not well-defined on the current exam. 6. Mild splenomegaly. 7. Trace right pleural effusion.   05/19/2020 - 08/04/2020 Chemotherapy   Adjuvant Gemcitabine 2 weeks on 1 week off starting 05/19/20 and Xeloda started on 05/25/2020. Xeloda dose currently at 1521m in the AM and 10041min the PM. Completed 08/04/20.    08/18/2020 Imaging   CT CAP  IMPRESSION: 1. Interval improvement in previously demonstrated ascites and generalized mesenteric edema. No peritoneal nodularity or focal extraluminal fluid collection identified. 2. Stable postsurgical changes from previous Whipple procedure. No evidence of local recurrence or metastatic disease. 3. Pancreatic stent remains in place without pancreatic ductal dilatation. Although there is no pneumobilia, there is no significant biliary dilatation. 4. Stable mild splenomegaly. 5. No significant findings in the chest.   12/08/2020 Imaging   CT CAP  IMPRESSION: 1. Status post Whipple pancreaticoduodenectomy. 2. There are numerous newly enlarged, matted appearing retroperitoneal lymph nodes, largest left retroperitoneal nodes measuring up to 1.9 x 1.2 cm. Findings are highly concerning for nodal metastatic disease. 3. Main pancreatic duct stent remains within the pancreatic duct but advanced several centimeters into the jejunal efferent limb when compared to prior examination. There  is new, diffuse pancreatic ductal dilatation, measuring up to 6 mm. 4. No evidence of organ metastatic disease or distant metastatic disease  in the chest. 5. Small volume perihepatic ascites, unchanged, suspicious for malignant ascites, although without direct evidence of omental or peritoneal metastatic disease. 6. Coronary artery disease.   12/22/2020 PET scan   IMPRESSION: No evidence of recurrent or metastatic carcinoma.      INTERVAL HISTORY:  Breanna Noble is here for a follow up of pancreatic cancer. She was last seen by me on 09/06/21. She presents to the clinic accompanied by her husband. She reports she is doing well overall, denies any stomach concerns. She endorses taking omeprazole for her reflux, per my recommendation, and finds this helpful.   All other systems were reviewed with the patient and are negative.  MEDICAL HISTORY:  Past Medical History:  Diagnosis Date   Cataracts, bilateral    Diverticulosis 12/07/2001   found on colonoscopy   Family history of colon cancer    Family history of pancreatic cancer    Hypothyroid 10/2007   Macular hole    pancreatic ca 08/2019   pancreatic   Pancreatic mass    PONV (postoperative nausea and vomiting)     SURGICAL HISTORY: Past Surgical History:  Procedure Laterality Date   APPENDECTOMY N/A 03/24/2020   Procedure: APPENDECTOMY;  Surgeon: Stark Klein, MD;  Location: [place];  Service: General;  Laterality: N/A;   BILIARY STENT PLACEMENT  10/10/2019   Procedure: BILIARY STENT PLACEMENT;  Surgeon: Arta Silence, MD;  Location: [place];  Service: Endoscopy;;   CATARACT EXTRACTION, BILATERAL     COLONOSCOPY     ERCP N/A 10/10/2019   Procedure: ENDOSCOPIC RETROGRADE CHOLANGIOPANCREATOGRAPHY (ERCP);  Surgeon: Arta Silence, MD;  [facility] ENDOSCOPY;  Service: Endoscopy;  Laterality: N/A;   ESOPHAGOGASTRODUODENOSCOPY N/A 10/10/2019   Procedure: ESOPHAGOGASTRODUODENOSCOPY (EGD);  Surgeon: Arta Silence, MD;  Location[facility] ENDOSCOPY;  Service: Endoscopy;  Laterality: N/A;   EUS N/A 10/10/2019   Procedure: UPPER ENDOSCOPIC ULTRASOUND (EUS) LINEAR;   Surgeon: Arta Silence, MD;  Location: [place];  Service: Endoscopy;  Laterality: N/A;   EYE SURGERY     FINE NEEDLE ASPIRATION  10/10/2019   Procedure: FINE NEEDLE ASPIRATION (FNA) LINEAR;  Surgeon: Arta Silence, MD;  Location: [place] ENDOSCOPY;  Service: Endoscopy;;   IR IMAGING GUIDED PORT INSERTION  10/25/2019   LAPAROSCOPY N/A 03/24/2020   Procedure: LAPAROSCOPY DIAGNOSTIC;  Surgeon: Stark Klein, MD;  Location: [place];  Service: General;  Laterality: N/A;   Macular hole surg     PANCREATIC STENT PLACEMENT  10/10/2019   Procedure: PANCREATIC STENT PLACEMENT;  Surgeon: Arta Silence, MD;  Location: [facility] ENDOSCOPY;  Service: Endoscopy;;   SPHINCTEROTOMY  10/10/2019   Procedure: Joan Mayans;  Surgeon: Arta Silence, MD;  Location: [place];  Service: Endoscopy;;   TONSILLECTOMY     TONSILLECTOMY     WHIPPLE PROCEDURE N/A 03/24/2020   Procedure: WHIPPLE PROCEDURE;  Surgeon: Stark Klein, MD;  Location: [place];  Service: General;  Laterality: N/A;    I have reviewed the social history and family history with the patient and they are unchanged from previous note.  ALLERGIES:  has No Known Allergies.  MEDICATIONS:  Current Outpatient Medications  Medication Sig Dispense Refill   Ascorbic Acid (VITAMIN C PO) Take 1,000 mg by mouth daily.      Calcium Carb-Cholecalciferol (CALCIUM 1000 + D PO) Take 1 tablet by mouth daily.      estradiol (ESTRACE VAGINAL) 0.1  MG/GM vaginal cream Place 1 Applicatorful vaginally 2 (two) times a week. Initial dose: Use nightly x 2 weeks, then twice weekly 42.5 g 3   ferrous sulfate 325 (65 FE) MG tablet Take 325 mg by mouth daily with breakfast.     ibuprofen (ADVIL) 200 MG tablet Take 400 mg by mouth every 6 (six) hours as needed for moderate pain.     levothyroxine (SYNTHROID, LEVOTHROID) 50 MCG tablet Take 1 tablet (50 mcg total) by mouth daily. 90 tablet 4   Multiple Vitamin (MULTIVITAMIN PO) Take 1 tablet by mouth daily.     vitamin B-12  (CYANOCOBALAMIN) 1000 MCG tablet Take 1,000 mcg by mouth daily.     No current facility-administered medications for this visit.    PHYSICAL EXAMINATION: ECOG PERFORMANCE STATUS: 0 - Asymptomatic  Vitals:   03/09/22 0921  BP: 119/61  Pulse: 62  Resp: 18  Temp: 98 F (36.7 C)  SpO2: 100%   Wt Readings from Last 3 Encounters:  03/09/22 126 lb 14.4 oz (57.6 kg)  10/26/21 129 lb (58.5 kg)  09/06/21 127 lb 9.6 oz (57.9 kg)     GENERAL:alert, no distress and comfortable SKIN: skin color, texture, turgor are normal, no rashes or significant lesions EYES: normal, Conjunctiva are pink and non-injected, sclera clear  NECK: supple, thyroid normal size, non-tender, without nodularity LYMPH:  no palpable lymphadenopathy in the cervical, axillary  LUNGS: clear to auscultation and percussion with normal breathing effort HEART: regular rate & rhythm and no murmurs and no lower extremity edema ABDOMEN:abdomen soft, non-tender and normal bowel sounds Musculoskeletal:no cyanosis of digits and no clubbing  NEURO: alert & oriented x 3 with fluent speech, no focal motor/sensory deficits  LABORATORY DATA:  I have reviewed the data as listed    Latest Ref Rng & Units 03/09/2022    8:40 AM 09/02/2021    7:40 AM 07/22/2021    8:48 AM  CBC  WBC 4.0 - 10.5 K/uL 5.2  5.8  6.0   Hemoglobin 12.0 - 15.0 g/dL 12.8  13.6  13.2   Hematocrit 36.0 - 46.0 % 39.1  40.3  40.2   Platelets 150 - 400 K/uL 142  175  173         Latest Ref Rng & Units 03/09/2022    8:40 AM 09/02/2021    7:40 AM 07/22/2021    8:48 AM  CMP  Glucose 70 - 99 mg/dL 123  103  108   BUN 6 - 20 mg/dL _0 Creatinine 0.44 - 1.00 mg/dL 0.96  0.58  0.55   Sodium 135 - 145 mmol/L 139  139  140   Potassium 3.5 - 5.1 mmol/L 3.8  4.1  3.9   Chloride 98 - 111 mmol/L 105  102  103   CO2 22 - 32 mmol/L _1 Calcium 8.9 - 10.3 mg/dL 8.9  9.6  9.7   Total Protein 6.5 - 8.1 g/dL 6.4  7.0  7.0   Total Bilirubin 0.3 - 1.2 mg/dL 0.3   0.5  0.4   Alkaline Phos 38 - 126 U/L 78  80  86   AST 15 - 41 U/L _2 ALT 0 - 44 U/L _3 RADIOGRAPHIC STUDIES: I have personally reviewed the radiological images as listed and agreed with the findings in the report. No results found.  Orders Placed This Encounter  Procedures   CT CHEST ABDOMEN PELVIS W CONTRAST    Standing Status:   Future    Standing Expiration Date:   03/10/2023    Order Specific Question:   Is patient pregnant?    Answer:   No    Order Specific Question:   Preferred imaging location?    Answer:   [facility]    Order Specific Question:   Release to patient    Answer:   Immediate    Order Specific Question:   Is Oral Contrast requested for this exam?    Answer:   Yes, Per Radiology protocol   Cancer antigen 19-9    Standing Status:   Standing    Number of Occurrences:   20    Standing Expiration Date:   03/10/2023   All questions were answered. The patient knows to call the clinic with any problems, questions or concerns. No barriers to learning was detected. The total time spent in the appointment was 30 minutes.     Truitt Merle, MD 03/09/2022   I, Wilburn Mylar, am acting as scribe for Truitt Merle, MD.   I have reviewed the above documentation for accuracy and completeness, and I agree with the above.

## 2022-03-10 LAB — CANCER ANTIGEN 19-9: CA 19-9: 43 U/mL — ABNORMAL HIGH (ref 0–35)

## 2022-03-16 DIAGNOSIS — Z8507 Personal history of malignant neoplasm of pancreas: Secondary | ICD-10-CM | POA: Diagnosis not present

## 2022-03-16 DIAGNOSIS — Z0000 Encounter for general adult medical examination without abnormal findings: Secondary | ICD-10-CM | POA: Diagnosis not present

## 2022-03-16 DIAGNOSIS — Z9049 Acquired absence of other specified parts of digestive tract: Secondary | ICD-10-CM | POA: Diagnosis not present

## 2022-03-16 DIAGNOSIS — Z789 Other specified health status: Secondary | ICD-10-CM | POA: Diagnosis not present

## 2022-03-16 DIAGNOSIS — E039 Hypothyroidism, unspecified: Secondary | ICD-10-CM | POA: Diagnosis not present

## 2022-03-16 DIAGNOSIS — R739 Hyperglycemia, unspecified: Secondary | ICD-10-CM | POA: Diagnosis not present

## 2022-03-24 IMAGING — CT CT ABD-PEL WO/W CM
2 of 12 series · 11 of 46 positions shown, 16 images · IV contrast (APPLIED)
Comparison: 10/23/2019

CLINICAL DATA: Pancreatic cancer.  Restaging.

EXAM:
CT ABDOMEN AND PELVIS WITHOUT AND WITH CONTRAST
TECHNIQUE: Multidetector CT imaging of the abdomen and pelvis was performed
following the standard protocol before and following the bolus
administration of intravenous contrast.
CONTRAST:  100mL OMNIPAQUE IOHEXOL 300 MG/ML  SOLN

[Series 5: coronal arterial · coronal · arterial · 0.57mm/px · 3 of 69 slices shown]
[im 18/69  soft-tissue]
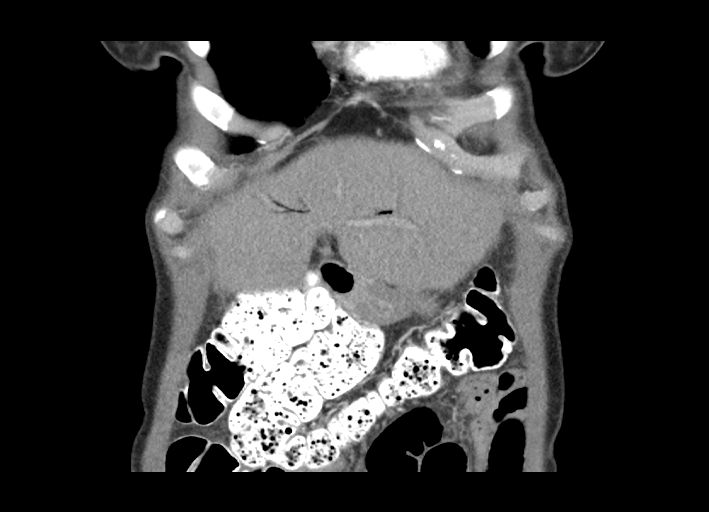
[im 35/69  soft-tissue]
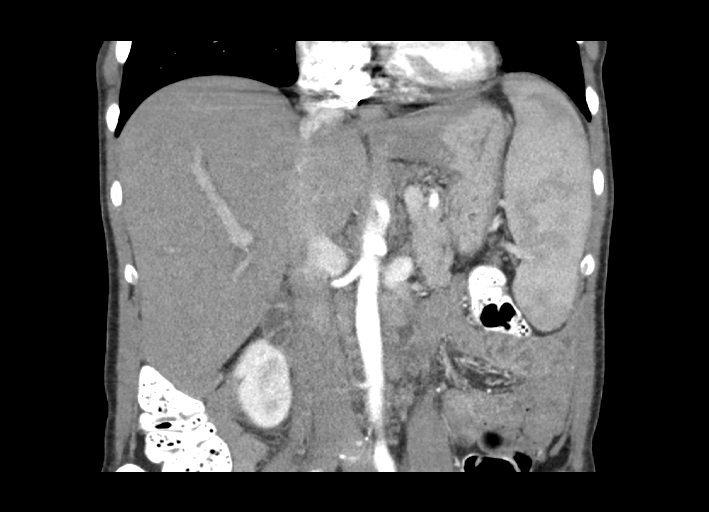
[im 52/69  soft-tissue]
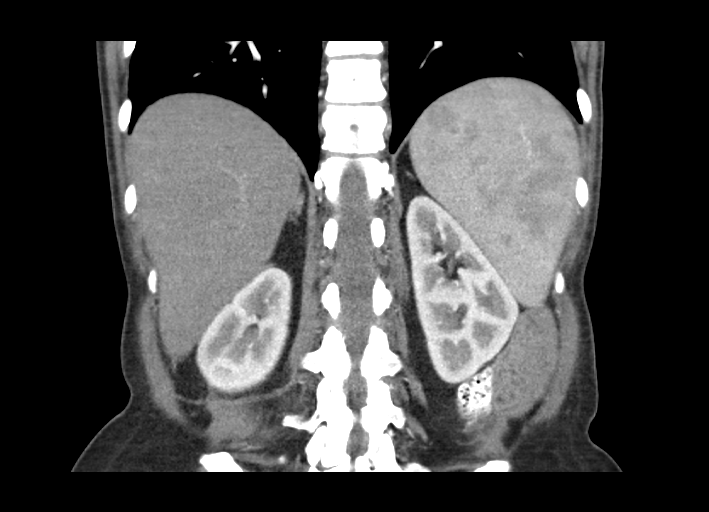

[Series 13: portal thin · axial · portal-venous · 0.72mm/px · z∈[-86,+254]mm · 8 of 220 slices shown, 13 images]
[im 25/220  soft-tissue]
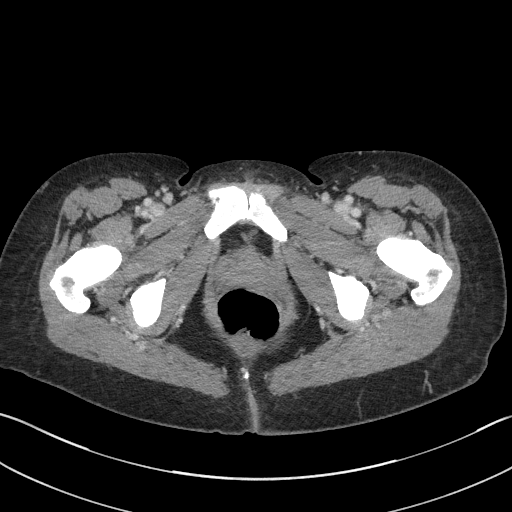
[im 25/220  bone]
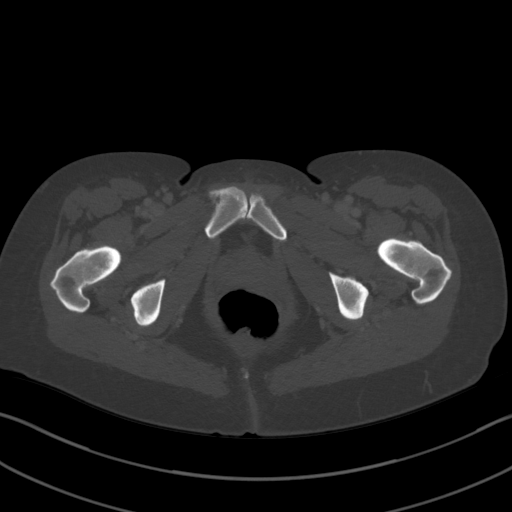
[im 49/220  soft-tissue]
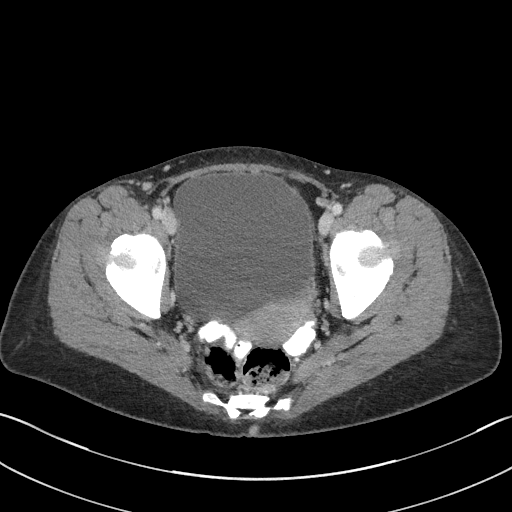
[im 74/220  soft-tissue]
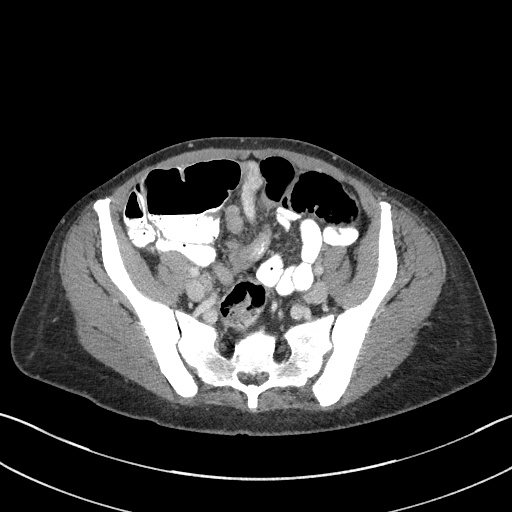
[im 98/220  soft-tissue]
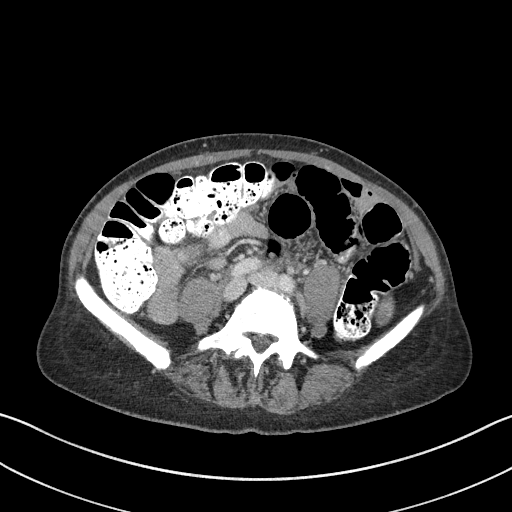
[im 122/220  soft-tissue]
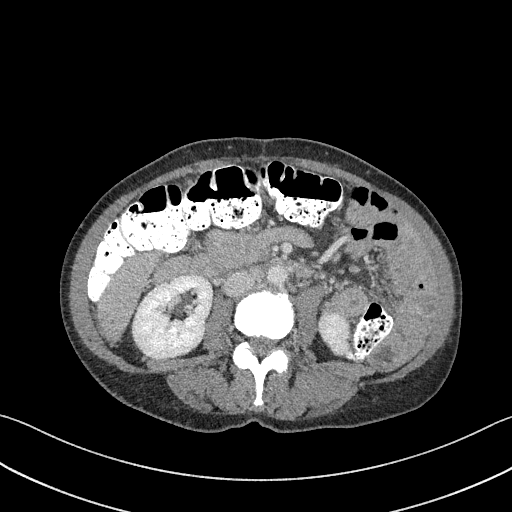
[im 122/220  lung]
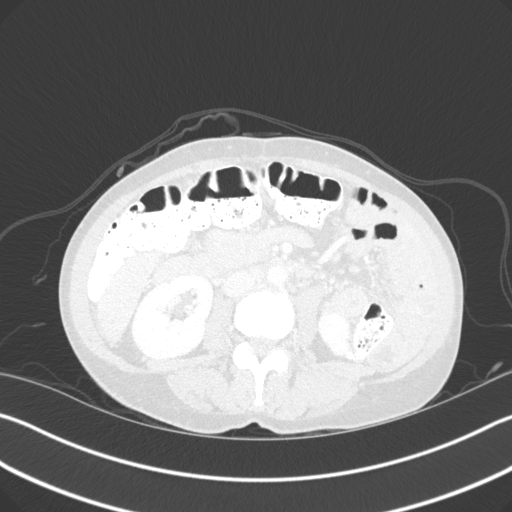
[im 147/220  soft-tissue]
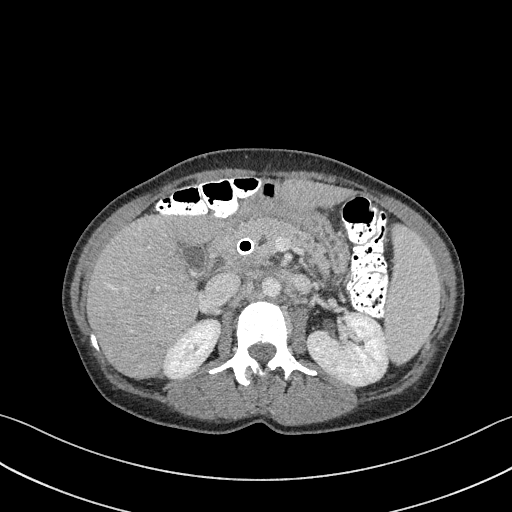
[im 147/220  lung]
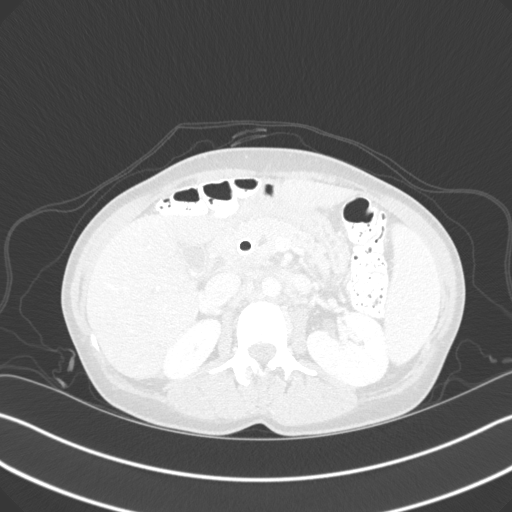
[im 171/220  soft-tissue]
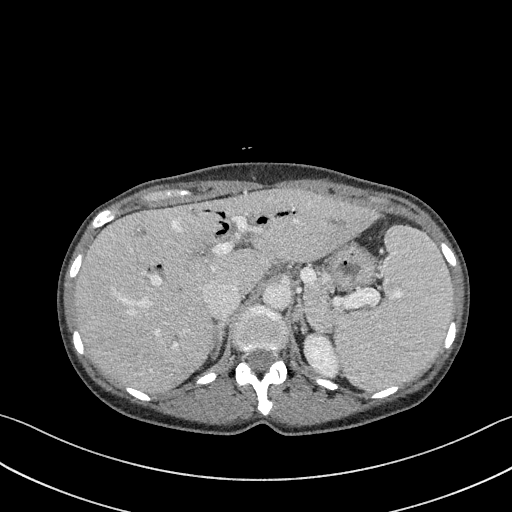
[im 171/220  lung]
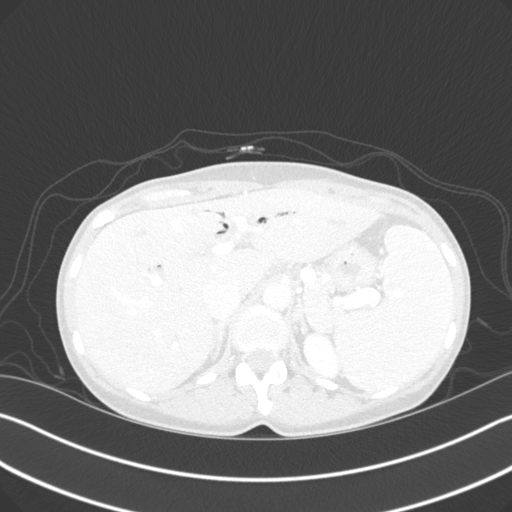
[im 195/220  soft-tissue]
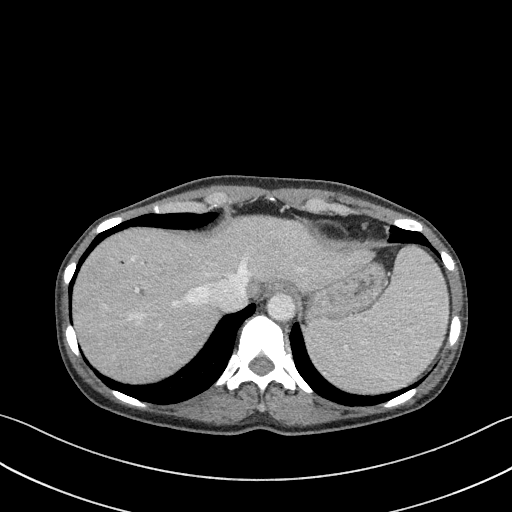
[im 195/220  lung]
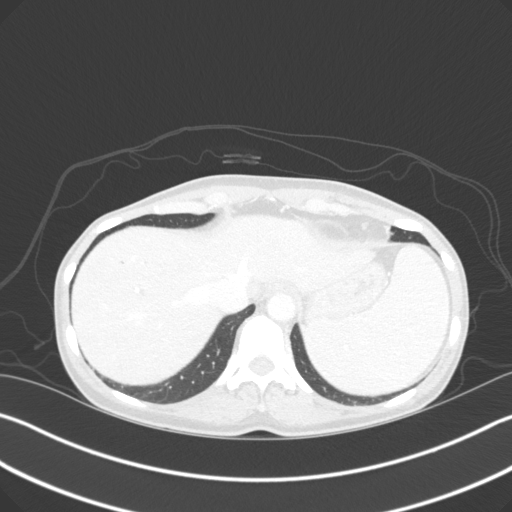

[11 of 46 positions shown; findings below may reference images not displayed]

FINDINGS: Lower chest: Unremarkable.

Hepatobiliary: No suspicious focal abnormality within the liver
parenchyma. Similar appearance of mild intrahepatic biliary duct
prominence and pneumobilia. Common bile duct stent again visualized
in situ.

Pancreas: Posterior pancreatic head mass again noted at 3.0 x 1.2 cm
today compared to 3.1 x 1.3 cm previously. Stable main pancreatic
ductal dilatation.

Spleen: 15.7 cm craniocaudal length, increased from 11.1 cm
previously.

Adrenals/Urinary Tract: No adrenal nodule or mass. Kidneys
unremarkable. No evidence for hydroureter. The urinary bladder
appears normal for the degree of distention.

Stomach/Bowel: Stomach is unremarkable. No gastric wall thickening.
No evidence of outlet obstruction. Duodenum is normally positioned
as is the ligament of Treitz. No small bowel wall thickening. No
small bowel dilatation. The terminal ileum is normal. The appendix
is normal. No gross colonic mass. No colonic wall thickening.

Vascular/Lymphatic: No abdominal aortic aneurysm. No abdominal
aortic atherosclerotic calcification. Soft tissue attenuation around
the celiac axis is similar to prior. A collar of soft tissue
attenuation is visible around the IMA today, new in the interval.
There is diffuse haziness in the para-aortic retroperitoneum. Stable
9 mm short axis pre caval lymph node. 11 mm short axis aortocaval
node on 107/13 was 10 mm previously. Additional scattered small
retroperitoneal lymph nodes are similar. No pelvic sidewall
lymphadenopathy. 10 mm short axis right external iliac node
measured previously is 7 mm today.

Reproductive: The uterus is unremarkable.  There is no adnexal mass.

Other: No intraperitoneal free fluid.

Musculoskeletal: No worrisome lytic or sclerotic osseous
abnormality.
IMPRESSION: 1. No substantial interval change in exam.
2. Similar appearance of the pancreatic mass with associated main
pancreatic ductal dilatation.
3. Soft tissue stranding again noted around the celiac axis, in the
para-aortic retroperitoneal space and along the IMA (new in the
interval). These findings are associated with similar appearance of
upper normal to borderline hepatoduodenal ligament and
retroperitoneal lymphadenopathy. Close continued attention on
follow-up recommended.
4. Borderline enlarged right external iliac node identified on
previous study has decreased in size in the interval.

## 2022-04-21 ENCOUNTER — Inpatient Hospital Stay: Payer: BC Managed Care – PPO | Attending: Hematology

## 2022-04-21 ENCOUNTER — Other Ambulatory Visit: Payer: Self-pay

## 2022-04-21 DIAGNOSIS — C250 Malignant neoplasm of head of pancreas: Secondary | ICD-10-CM | POA: Diagnosis not present

## 2022-04-21 DIAGNOSIS — Z452 Encounter for adjustment and management of vascular access device: Secondary | ICD-10-CM | POA: Insufficient documentation

## 2022-04-21 DIAGNOSIS — Z95828 Presence of other vascular implants and grafts: Secondary | ICD-10-CM

## 2022-04-21 MED ORDER — SODIUM CHLORIDE 0.9% FLUSH
10.0000 mL | Freq: Once | INTRAVENOUS | Status: AC
  Administered 2022-04-21: 10 mL

## 2022-04-21 MED ORDER — HEPARIN SOD (PORK) LOCK FLUSH 100 UNIT/ML IV SOLN
500.0000 [IU] | Freq: Once | INTRAVENOUS | Status: AC
  Administered 2022-04-21: 500 [IU]

## 2022-06-02 ENCOUNTER — Inpatient Hospital Stay: Payer: BC Managed Care – PPO | Attending: Hematology

## 2022-06-02 ENCOUNTER — Other Ambulatory Visit: Payer: Self-pay

## 2022-06-02 DIAGNOSIS — C250 Malignant neoplasm of head of pancreas: Secondary | ICD-10-CM | POA: Insufficient documentation

## 2022-06-02 DIAGNOSIS — Z452 Encounter for adjustment and management of vascular access device: Secondary | ICD-10-CM | POA: Diagnosis not present

## 2022-06-02 DIAGNOSIS — Z95828 Presence of other vascular implants and grafts: Secondary | ICD-10-CM

## 2022-06-02 LAB — COMPREHENSIVE METABOLIC PANEL
ALT: 28 U/L (ref 0–44)
AST: 30 U/L (ref 15–41)
Albumin: 4.2 g/dL (ref 3.5–5.0)
Alkaline Phosphatase: 83 U/L (ref 38–126)
Anion gap: 4 — ABNORMAL LOW (ref 5–15)
BUN: 6 mg/dL — ABNORMAL LOW (ref 8–23)
CO2: 31 mmol/L (ref 22–32)
Calcium: 9.3 mg/dL (ref 8.9–10.3)
Chloride: 104 mmol/L (ref 98–111)
Creatinine, Ser: 0.48 mg/dL (ref 0.44–1.00)
GFR, Estimated: >60 mL/min (ref >60)
Glucose, Bld: 96 mg/dL (ref 70–99)
Potassium: 4.3 mmol/L (ref 3.5–5.1)
Sodium: 139 mmol/L (ref 135–145)
Total Bilirubin: 0.4 mg/dL (ref 0.3–1.2)
Total Protein: 6.9 g/dL (ref 6.5–8.1)

## 2022-06-02 LAB — CBC WITH DIFFERENTIAL/PLATELET
Abs Immature Granulocytes: 0.01 10*3/uL (ref 0.00–0.07)
Basophils Absolute: 0 10*3/uL (ref 0.0–0.1)
Basophils Relative: 1 %
Eosinophils Absolute: 0.1 10*3/uL (ref 0.0–0.5)
Eosinophils Relative: 2 %
HCT: 38.7 % (ref 36.0–46.0)
Hemoglobin: 12.8 g/dL (ref 12.0–15.0)
Immature Granulocytes: 0 %
Lymphocytes Relative: 51 %
Lymphs Abs: 3.2 10*3/uL (ref 0.7–4.0)
MCH: 29.9 pg (ref 26.0–34.0)
MCHC: 33.1 g/dL (ref 30.0–36.0)
MCV: 90.4 fL (ref 80.0–100.0)
Monocytes Absolute: 0.6 10*3/uL (ref 0.1–1.0)
Monocytes Relative: 11 %
Neutro Abs: 2.1 10*3/uL (ref 1.7–7.7)
Neutrophils Relative %: 35 %
Platelets: 179 10*3/uL (ref 150–400)
RBC: 4.28 MIL/uL (ref 3.87–5.11)
RDW: 13.4 % (ref 11.5–15.5)
WBC: 6.1 10*3/uL (ref 4.0–10.5)
nRBC: 0 % (ref 0.0–0.2)

## 2022-06-02 MED ORDER — HEPARIN SOD (PORK) LOCK FLUSH 100 UNIT/ML IV SOLN
500.0000 [IU] | Freq: Once | INTRAVENOUS | Status: AC
  Administered 2022-06-02: 500 [IU]

## 2022-06-02 MED ORDER — SODIUM CHLORIDE 0.9% FLUSH
10.0000 mL | Freq: Once | INTRAVENOUS | Status: AC
  Administered 2022-06-02: 10 mL

## 2022-06-03 DIAGNOSIS — Z800 Family history of malignant neoplasm of digestive organs: Secondary | ICD-10-CM | POA: Diagnosis not present

## 2022-06-03 DIAGNOSIS — C250 Malignant neoplasm of head of pancreas: Secondary | ICD-10-CM | POA: Diagnosis not present

## 2022-06-03 DIAGNOSIS — K219 Gastro-esophageal reflux disease without esophagitis: Secondary | ICD-10-CM | POA: Diagnosis not present

## 2022-06-03 DIAGNOSIS — R079 Chest pain, unspecified: Secondary | ICD-10-CM | POA: Diagnosis not present

## 2022-06-04 ENCOUNTER — Encounter: Payer: Self-pay | Admitting: Hematology

## 2022-06-04 LAB — CANCER ANTIGEN 19-9: CA 19-9: 54 U/mL — ABNORMAL HIGH (ref 0–35)

## 2022-06-19 NOTE — Progress Notes (Unsigned)
Cardiology Office Note:    Date:  06/21/2022   ID:  Breanna Noble, DOB May 14, 1960, MRN [id number]  PCP:  Vernie Shanks, MD (Inactive)  Cardiologist:  None  Electrophysiologist:  None   Referring MD: Stark Klein, MD   Chief Complaint  Patient presents with   Chest Pain    History of Present Illness:    Breanna Noble is a 62 y.o. female with a hx of pancreatic cancer, hypothyroidism who is referred by Dr. Barry Dienes for evaluation of chest pain.  She reports has had 2 episodes of chest pain.  Describes as occurring in center chest, initially dull aching pain that then became sharp.  Dull pain lasted for hours while sharp pain lasted for minutes.  Occurred at rest.  She rides stationary bike daily for 30 minutes, denies any chest pain with this.  She denies any dyspnea, lightheadedness, syncope, lower extremity edema.  Reports rare palpitations.  No smoking history.  Family history includes father had MI in 58s.    Past Medical History:  Diagnosis Date   Cataracts, bilateral    Diverticulosis 12/07/2001   found on colonoscopy   Family history of colon cancer    Family history of pancreatic cancer    Hypothyroid 10/2007   Macular hole    pancreatic ca 08/2019   pancreatic   Pancreatic mass    PONV (postoperative nausea and vomiting)     Past Surgical History:  Procedure Laterality Date   APPENDECTOMY N/A 03/24/2020   Procedure: APPENDECTOMY;  Surgeon: Stark Klein, MD;  Location: [place];  Service: General;  Laterality: N/A;   BILIARY STENT PLACEMENT  10/10/2019   Procedure: BILIARY STENT PLACEMENT;  Surgeon: Arta Silence, MD;  Location: [place];  Service: Endoscopy;;   CATARACT EXTRACTION, BILATERAL     COLONOSCOPY     ERCP N/A 10/10/2019   Procedure: ENDOSCOPIC RETROGRADE CHOLANGIOPANCREATOGRAPHY (ERCP);  Surgeon: Arta Silence, MD;  Location[facility] END[facility];  Service: Endoscopy;  Laterality: N/A;   ESOPHAGOGASTRODUODENOSCOPY N/A 10/10/2019   Procedure:  ESOPHAGOGASTRODUODENOSCOPY (EGD);  Surgeon: Arta Silence, MD;  Location: [facility] ENDOSCOPY;  Service: Endoscopy;  Laterality: N/A;   EUS N/A 10/10/2019   Procedure: UPPER ENDOSCOPIC ULTRASOUND (EUS) LINEAR;  Surgeon: Arta Silence, MD;  Location: [place];  Service: Endoscopy;  Laterality: N/A;   EYE SURGERY     FINE NEEDLE ASPIRATION  10/10/2019   Procedure: FINE NEEDLE ASPIRATION (FNA) LINEAR;  Surgeon: Arta Silence, MD;  Location: [place] ENDOSCOPY;  Service: Endoscopy;;   IR IMAGING GUIDED PORT INSERTION  10/25/2019   LAPAROSCOPY N/A 03/24/2020   Procedure: LAPAROSCOPY DIAGNOSTIC;  Surgeon: Stark Klein, MD;  Location: [place];  Service: General;  Laterality: N/A;   Macular hole surg     PANCREATIC STENT PLACEMENT  10/10/2019   Procedure: PANCREATIC STENT PLACEMENT;  Surgeon: Arta Silence, MD;  Location: [facility] ENDOSCOPY;  Service: Endoscopy;;   SPHINCTEROTOMY  10/10/2019   Procedure: Joan Mayans;  Surgeon: Arta Silence, MD;  Location: [place];  Service: Endoscopy;;   TONSILLECTOMY     TONSILLECTOMY     WHIPPLE PROCEDURE N/A 03/24/2020   Procedure: WHIPPLE PROCEDURE;  Surgeon: Stark Klein, MD;  Location: [place];  Service: General;  Laterality: N/A;    Current Medications: Current Meds  Medication Sig   Ascorbic Acid (VITAMIN C PO) Take 1,000 mg by mouth daily.    Calcium Carb-Cholecalciferol (CALCIUM 1000 + D PO) Take 1 tablet by mouth daily.    ferrous sulfate 325 (65 FE) MG tablet  Take 325 mg by mouth daily with breakfast.   ibuprofen (ADVIL) 200 MG tablet Take 400 mg by mouth every 6 (six) hours as needed for moderate pain.   levothyroxine (SYNTHROID, LEVOTHROID) 50 MCG tablet Take 1 tablet (50 mcg total) by mouth daily.   metoprolol tartrate (LOPRESSOR) 50 MG tablet Take 50 mg (1 tablet) TWO hours prior to CT scan   Multiple Vitamin (MULTIVITAMIN PO) Take 1 tablet by mouth daily.   vitamin B-12 (CYANOCOBALAMIN) 1000 MCG tablet Take 1,000 mcg by mouth daily.      Allergies:   Patient has no known allergies.   Social History   Socioeconomic History   Marital status: Married    Spouse name: Not on file   Number of children: 2   Years of education: Not on file   Highest education level: Not on file  Occupational History   Not on file  Tobacco Use   Smoking status: Never   Smokeless tobacco: Never  Vaping Use   Vaping Use: Never used  Substance and Sexual Activity   Alcohol use: No    Alcohol/week: 0.0 standard drinks of alcohol   Drug use: No   Sexual activity: Yes    Birth control/protection: Post-menopausal    Comment: 1st intercourse 49 yo-2 partners  Other Topics Concern   Not on file  Social History Narrative   Not on file   Social Determinants of Health   Financial Resource Strain: Not on file  Food Insecurity: Not on file  Transportation Needs: Not on file  Physical Activity: Not on file  Stress: Not on file  Social Connections: Not on file     Family History: The patient's family history includes Cancer in her maternal grandfather and maternal grandmother; Cancer (age of onset: 57) in her maternal uncle; Dementia in her maternal uncle and paternal grandmother; Diabetes in her paternal grandfather; Heart disease in her father and maternal grandfather; Hypertension in her father; Other in her mother; Stroke in her paternal uncle. There is no history of Breast cancer.  ROS:   Please see the history of present illness.     All other systems reviewed and are negative.  EKGs/Labs/Other Studies Reviewed:    The following studies were reviewed today:   EKG:   06/21/2022: Normal sinus rhythm, rate 72, 1 mm ST depressions in inferior leads and V5/6, right axis deviation  Recent Labs: 06/02/2022: ALT 28; BUN 6; Creatinine, Ser 0.48; Hemoglobin 12.8; Platelets 179; Potassium 4.3; Sodium 139  Recent Lipid Panel    Component Value Date/Time   CHOL 177 12/10/2012 1029   TRIG 147 12/10/2012 1029   HDL 62 12/10/2012 1029    CHOLHDL 2.9 12/10/2012 1029   LDLCALC 86 12/10/2012 1029    Physical Exam:    VS:  BP 124/72   Pulse 72   Ht 5' 5.5" (1.664 m)   Wt 124 lb 12.8 oz (56.6 kg)   LMP 11/22/2013   SpO2 95%   BMI 20.45 kg/m     Wt Readings from Last 3 Encounters:  06/21/22 124 lb 12.8 oz (56.6 kg)  03/09/22 126 lb 14.4 oz (57.6 kg)  10/26/21 129 lb (58.5 kg)     GEN:  Well nourished, well developed in no acute distress HEENT: Normal NECK: No JVD; No carotid bruits LYMPHATICS: No lymphadenopathy CARDIAC: RRR, no murmurs, rubs, gallops RESPIRATORY:  Clear to auscultation without rales, wheezing or rhonchi  ABDOMEN: Soft, non-tender, non-distended MUSCULOSKELETAL:  No edema; No deformity  SKIN:  Warm and dry NEUROLOGIC:  Alert and oriented x 3 PSYCHIATRIC:  Normal affect   ASSESSMENT:    1. Chest pain of uncertain etiology   2. Lipid screening   3. Pre-procedure lab exam   4. Nonspecific abnormal electrocardiogram (ECG) (EKG)    PLAN:    Chest pain: Atypical in description but does have CAD risk factors (age, family history) and prior CT chest 05/2021 shows LAD calcifications.  Recommend coronary CTA to rule out obstructive CAD  Abnormal EKG: Inferior ST depressions and right axis deviation.  Plan coronary CTA as above and will check echocardiogram to rule out structural heart disease  Lipid screening: Check lipid panel.  Given LAD calcifications on prior CT, likely plan to start statin   RTC in 3 months   Medication Adjustments/Labs and Tests Ordered: Current medicines are reviewed at length with the patient today.  Concerns regarding medicines are outlined above.  Orders Placed This Encounter  Procedures   CT CORONARY MORPH W/CTA COR W/SCORE W/CA W/CM &/OR WO/CM   Basic metabolic panel   Lipid panel   EKG 12-Lead   ECHOCARDIOGRAM COMPLETE   Meds ordered this encounter  Medications   metoprolol tartrate (LOPRESSOR) 50 MG tablet    Sig: Take 50 mg (1 tablet) TWO hours prior to  CT scan    Dispense:  1 tablet    Refill:  0    Patient Instructions  Medication Instructions:  Your physician recommends that you continue on your current medications as directed. Please refer to the Current Medication list given to you today.  *If you need a refill on your cardiac medications before your next appointment, please call your pharmacy*  Lab Work: BMET, Lipid today  If you have labs (blood work) drawn today and your tests are completely normal, you will receive your results only by: [place] (if you have MyChart) OR A paper copy in the mail If you have any lab test that is abnormal or we need to change your treatment, we will call you to review the results.   Testing/Procedures: Coronary CTA-see instructions below  Your physician has requested that you have an echocardiogram. Echocardiography is a painless test that uses sound waves to create images of your heart. It provides your doctor with information about the size and shape of your heart and how well your heart's chambers and valves are working. This procedure takes approximately one hour. There are no restrictions for this procedure. Please do NOT wear cologne, perfume, aftershave, or lotions (deodorant is allowed). Please arrive 15 minutes prior to your appointment time.  Follow-Up: At [facility], you and your health needs are our priority.  As part of our continuing mission to provide you with exceptional heart care, we have created designated Provider Care Teams.  These Care Teams include your primary Cardiologist (physician) and Advanced Practice Providers (APPs -  Physician Assistants and Nurse Practitioners) who all work together to provide you with the care you need, when you need it.  We recommend signing up for the patient portal called "MyChart".  Sign up information is provided on this After Visit Summary.  MyChart is used to connect with patients for Virtual Visits (Telemedicine).   Patients are able to view lab/test results, encounter notes, upcoming appointments, etc.  Non-urgent messages can be sent to your provider as well.   To learn more about what you can do with MyChart, go to [email or web address].    Your next appointment:   3 month(s)  Provider:   Dr. Gardiner Rhyme Other Instructions   Your cardiac CT will be scheduled at one of the below locations:   [facility] [address] [place], [place] [address] [phone]  If scheduled at [facility], please arrive at the [facility] and Children's Entrance (Entrance C2) of [facility] 30 minutes prior to test start time. You can use the FREE valet parking offered at entrance C (encouraged to control the heart rate for the test)  Proceed to the [facility] Radiology Department (first floor) to check-in and test prep.  All radiology patients and guests should use entrance C2 at [facility], accessed from [facility], even though the hospital's physical address listed is [address].     Please follow these instructions carefully (unless otherwise directed):  On the Night Before the Test: Be sure to Drink plenty of water. Do not consume any caffeinated/decaffeinated beverages or chocolate 12 hours prior to your test. Do not take any antihistamines 12 hours prior to your test.  On the Day of the Test: Drink plenty of water until 1 hour prior to the test. Do not eat any food 1 hour prior to test. You may take your regular medications prior to the test.  Take metoprolol 50 mg (Lopressor) two hours prior to test. If you take Furosemide/Hydrochlorothiazide/Spironolactone, please HOLD on the morning of the test. FEMALES- please wear underwire-free bra if available, avoid dresses & tight clothing      After the Test: Drink plenty of water. After receiving IV contrast, you may experience a mild flushed feeling. This is normal. On occasion, you may  experience a mild rash up to 24 hours after the test. This is not dangerous. If this occurs, you can take Benadryl 25 mg and increase your fluid intake. If you experience trouble breathing, this can be serious. If it is severe call 911 IMMEDIATELY. If it is mild, please call our office. If you take any of these medications: Glipizide/Metformin, Avandament, Glucavance, please do not take 48 hours after completing test unless otherwise instructed.  We will call to schedule your test 2-4 weeks out understanding that some insurance companies will need an authorization prior to the service being performed.   For non-scheduling related questions, please contact the cardiac imaging nurse navigator should you have any questions/concerns: Marchia Bond, Cardiac Imaging Nurse Navigator Gordy Clement, Cardiac Imaging Nurse Navigator [facility] Direct Office Dial: [phone]   For scheduling needs, including cancellations and rescheduling, please call [place], [phone].     Signed, Donato Heinz, MD  06/21/2022 5:27 PM    [facility]

## 2022-06-21 ENCOUNTER — Ambulatory Visit: Payer: BC Managed Care – PPO | Attending: Cardiology | Admitting: Cardiology

## 2022-06-21 ENCOUNTER — Encounter: Payer: Self-pay | Admitting: Cardiology

## 2022-06-21 VITALS — BP 124/72 | HR 72 | Ht 65.5 in | Wt 124.8 lb

## 2022-06-21 DIAGNOSIS — R9431 Abnormal electrocardiogram [ECG] [EKG]: Secondary | ICD-10-CM | POA: Diagnosis not present

## 2022-06-21 DIAGNOSIS — Z01812 Encounter for preprocedural laboratory examination: Secondary | ICD-10-CM | POA: Diagnosis not present

## 2022-06-21 DIAGNOSIS — Z13220 Encounter for screening for lipoid disorders: Secondary | ICD-10-CM | POA: Diagnosis not present

## 2022-06-21 DIAGNOSIS — R079 Chest pain, unspecified: Secondary | ICD-10-CM

## 2022-06-21 MED ORDER — METOPROLOL TARTRATE 50 MG PO TABS: ORAL_TABLET | ORAL | 0 refills | Status: DC

## 2022-06-21 NOTE — Patient Instructions (Addendum)
Medication Instructions:  Your physician recommends that you continue on your current medications as directed. Please refer to the Current Medication list given to you today.  *If you need a refill on your cardiac medications before your next appointment, please call your pharmacy*  Lab Work: BMET, Lipid today  If you have labs (blood work) drawn today and your tests are completely normal, you will receive your results only by: [place] (if you have MyChart) OR A paper copy in the mail If you have any lab test that is abnormal or we need to change your treatment, we will call you to review the results.   Testing/Procedures: Coronary CTA-see instructions below  Your physician has requested that you have an echocardiogram. Echocardiography is a painless test that uses sound waves to create images of your heart. It provides your doctor with information about the size and shape of your heart and how well your heart's chambers and valves are working. This procedure takes approximately one hour. There are no restrictions for this procedure. Please do NOT wear cologne, perfume, aftershave, or lotions (deodorant is allowed). Please arrive 15 minutes prior to your appointment time.  Follow-Up: At [facility], you and your health needs are our priority.  As part of our continuing mission to provide you with exceptional heart care, we have created designated Provider Care Teams.  These Care Teams include your primary Cardiologist (physician) and Advanced Practice Providers (APPs -  Physician Assistants and Nurse Practitioners) who all work together to provide you with the care you need, when you need it.  We recommend signing up for the patient portal called "MyChart".  Sign up information is provided on this After Visit Summary.  MyChart is used to connect with patients for Virtual Visits (Telemedicine).  Patients are able to view lab/test results, encounter notes, upcoming  appointments, etc.  Non-urgent messages can be sent to your provider as well.   To learn more about what you can do with MyChart, go to [email or web address].    Your next appointment:   3 month(s)  Provider:   Dr. Gardiner Rhyme Other Instructions   Your cardiac CT will be scheduled at one of the below locations:   [facility] [address] [place], [place] [address] [phone]  If scheduled at [facility], please arrive at the [facility] and Children's Entrance (Entrance C2) of [facility] 30 minutes prior to test start time. You can use the FREE valet parking offered at entrance C (encouraged to control the heart rate for the test)  Proceed to the [facility] Radiology Department (first floor) to check-in and test prep.  All radiology patients and guests should use entrance C2 at [facility], accessed from [facility], even though the hospital's physical address listed is [address].     Please follow these instructions carefully (unless otherwise directed):  On the Night Before the Test: Be sure to Drink plenty of water. Do not consume any caffeinated/decaffeinated beverages or chocolate 12 hours prior to your test. Do not take any antihistamines 12 hours prior to your test.  On the Day of the Test: Drink plenty of water until 1 hour prior to the test. Do not eat any food 1 hour prior to test. You may take your regular medications prior to the test.  Take metoprolol 50 mg (Lopressor) two hours prior to test. If you take Furosemide/Hydrochlorothiazide/Spironolactone, please HOLD on the morning of the test. FEMALES- please wear  underwire-free bra if available, avoid dresses & tight clothing      After the Test: Drink plenty of water. After receiving IV contrast, you may experience a mild flushed feeling. This is normal. On occasion, you may experience a mild rash up to 24 hours after the test. This is not  dangerous. If this occurs, you can take Benadryl 25 mg and increase your fluid intake. If you experience trouble breathing, this can be serious. If it is severe call 911 IMMEDIATELY. If it is mild, please call our office. If you take any of these medications: Glipizide/Metformin, Avandament, Glucavance, please do not take 48 hours after completing test unless otherwise instructed.  We will call to schedule your test 2-4 weeks out understanding that some insurance companies will need an authorization prior to the service being performed.   For non-scheduling related questions, please contact the cardiac imaging nurse navigator should you have any questions/concerns: Marchia Bond, Cardiac Imaging Nurse Navigator Gordy Clement, Cardiac Imaging Nurse Navigator [facility] Direct Office Dial: [phone]   For scheduling needs, including cancellations and rescheduling, please call [place], [phone].

## 2022-06-22 LAB — BASIC METABOLIC PANEL
BUN/Creatinine Ratio: 12 (ref 12–28)
BUN: 7 mg/dL — ABNORMAL LOW (ref 8–27)
CO2: 24 mmol/L (ref 20–29)
Calcium: 9.3 mg/dL (ref 8.7–10.3)
Chloride: 102 mmol/L (ref 96–106)
Creatinine, Ser: 0.6 mg/dL (ref 0.57–1.00)
Glucose: 94 mg/dL (ref 70–99)
Potassium: 4.5 mmol/L (ref 3.5–5.2)
Sodium: 142 mmol/L (ref 134–144)
eGFR: 102 mL/min/{1.73_m2} (ref >59)

## 2022-06-22 LAB — LIPID PANEL
Chol/HDL Ratio: 2.7 ratio (ref 0.0–4.4)
Cholesterol, Total: 130 mg/dL (ref 100–199)
HDL: 49 mg/dL (ref >39)
LDL Chol Calc (NIH): 60 mg/dL (ref 0–99)
Triglycerides: 118 mg/dL (ref 0–149)
VLDL Cholesterol Cal: 21 mg/dL (ref 5–40)

## 2022-06-28 ENCOUNTER — Telehealth (HOSPITAL_COMMUNITY): Payer: Self-pay | Admitting: Emergency Medicine

## 2022-06-28 NOTE — Telephone Encounter (Signed)
Reaching out to patient to offer assistance regarding upcoming cardiac imaging study; pt verbalizes understanding of appt date/time, parking situation and where to check in, pre-test NPO status and medications ordered, and verified current allergies; name and call back number provided for further questions should they arise Marchia Bond RN Navigator Cardiac Imaging [facility] [phone] office [phone] cell  Arrival [address] entrance  Denies iv issues '50mg'$  metoprolol

## 2022-06-29 ENCOUNTER — Ambulatory Visit (HOSPITAL_COMMUNITY)
Admission: RE | Admit: 2022-06-29 | Discharge: 2022-06-29 | Disposition: A | Discharge status: Home / Self Care | Payer: BC Managed Care – PPO | Source: Ambulatory Visit | Attending: Cardiology | Admitting: Cardiology

## 2022-06-29 DIAGNOSIS — R079 Chest pain, unspecified: Secondary | ICD-10-CM | POA: Diagnosis not present

## 2022-06-29 MED ORDER — IOHEXOL 350 MG/ML SOLN
100.0000 mL | Freq: Once | INTRAVENOUS | Status: AC | PRN
  Administered 2022-06-29: 100 mL via INTRAVENOUS

## 2022-06-29 MED ORDER — NITROGLYCERIN 0.4 MG SL SUBL
SUBLINGUAL_TABLET | SUBLINGUAL | Status: AC
  Administered 2022-06-29: 0.8 mg via SUBLINGUAL
  Filled 2022-06-29: qty 2

## 2022-06-29 MED ORDER — NITROGLYCERIN 0.4 MG SL SUBL: 0.8000 mg | SUBLINGUAL_TABLET | Freq: Once | SUBLINGUAL | Status: AC

## 2022-06-30 ENCOUNTER — Other Ambulatory Visit: Payer: Self-pay | Admitting: *Deleted

## 2022-06-30 MED ORDER — ATORVASTATIN CALCIUM 10 MG PO TABS: 10.0000 mg | ORAL_TABLET | Freq: Every day | ORAL | 3 refills | Status: DC

## 2022-07-14 ENCOUNTER — Ambulatory Visit (HOSPITAL_COMMUNITY): Payer: BC Managed Care – PPO | Attending: Cardiology

## 2022-07-14 DIAGNOSIS — R079 Chest pain, unspecified: Secondary | ICD-10-CM | POA: Diagnosis not present

## 2022-07-14 LAB — ECHOCARDIOGRAM COMPLETE
Area-P 1/2: 3.42 cm2
S' Lateral: 2.5 cm

## 2022-07-15 ENCOUNTER — Other Ambulatory Visit: Payer: Self-pay

## 2022-07-15 ENCOUNTER — Inpatient Hospital Stay: Payer: BC Managed Care – PPO | Attending: Hematology

## 2022-07-15 DIAGNOSIS — C250 Malignant neoplasm of head of pancreas: Secondary | ICD-10-CM | POA: Diagnosis not present

## 2022-07-15 DIAGNOSIS — Z452 Encounter for adjustment and management of vascular access device: Secondary | ICD-10-CM | POA: Diagnosis not present

## 2022-07-15 DIAGNOSIS — Z95828 Presence of other vascular implants and grafts: Secondary | ICD-10-CM

## 2022-07-15 MED ORDER — SODIUM CHLORIDE 0.9% FLUSH
10.0000 mL | Freq: Once | INTRAVENOUS | Status: AC
  Administered 2022-07-15: 10 mL

## 2022-07-15 MED ORDER — HEPARIN SOD (PORK) LOCK FLUSH 100 UNIT/ML IV SOLN
500.0000 [IU] | Freq: Once | INTRAVENOUS | Status: AC
  Administered 2022-07-15: 500 [IU]

## 2022-07-19 DIAGNOSIS — M940 Chondrocostal junction syndrome [Tietze]: Secondary | ICD-10-CM | POA: Diagnosis not present

## 2022-07-22 ENCOUNTER — Telehealth: Payer: Self-pay | Admitting: Cardiology

## 2022-07-22 MED ORDER — ATORVASTATIN CALCIUM 10 MG PO TABS: 10.0000 mg | ORAL_TABLET | Freq: Every day | ORAL | 3 refills | Status: DC

## 2022-07-22 NOTE — Telephone Encounter (Signed)
*  STAT* If patient is at the pharmacy, call can be transferred to refill team.   1. Which medications need to be refilled? (please list name of each medication and dose if known) atorvastatin (LIPITOR) 10 MG tablet   2. Which pharmacy/location (including street and city if local pharmacy) is medication to be sent to?    [facility] [place] [place] [phone]      3. Do they need a 30 day or 90 day supply? [place]

## 2022-07-22 NOTE — Telephone Encounter (Signed)
Spoke with pt. Lipitor 10 mg was sent to [place] per pts request.

## 2022-08-04 ENCOUNTER — Encounter: Payer: Self-pay | Admitting: Hematology

## 2022-09-02 ENCOUNTER — Ambulatory Visit (HOSPITAL_COMMUNITY)
Admission: RE | Admit: 2022-09-02 | Discharge: 2022-09-02 | Disposition: A | Discharge status: Home / Self Care | Payer: BC Managed Care – PPO | Source: Ambulatory Visit | Attending: Hematology | Admitting: Hematology

## 2022-09-02 DIAGNOSIS — C250 Malignant neoplasm of head of pancreas: Secondary | ICD-10-CM | POA: Insufficient documentation

## 2022-09-02 DIAGNOSIS — C259 Malignant neoplasm of pancreas, unspecified: Secondary | ICD-10-CM | POA: Diagnosis not present

## 2022-09-02 MED ORDER — SODIUM CHLORIDE (PF) 0.9 % IJ SOLN
INTRAMUSCULAR | Status: AC
  Filled 2022-09-02: qty 50

## 2022-09-02 MED ORDER — IOHEXOL 300 MG/ML  SOLN
100.0000 mL | Freq: Once | INTRAMUSCULAR | Status: AC | PRN
  Administered 2022-09-02: 100 mL via INTRAVENOUS

## 2022-09-08 ENCOUNTER — Encounter: Payer: Self-pay | Admitting: Nurse Practitioner

## 2022-09-08 ENCOUNTER — Inpatient Hospital Stay: Payer: BC Managed Care – PPO | Attending: Hematology | Admitting: Nurse Practitioner

## 2022-09-08 ENCOUNTER — Other Ambulatory Visit: Payer: Self-pay

## 2022-09-08 ENCOUNTER — Inpatient Hospital Stay: Payer: BC Managed Care – PPO

## 2022-09-08 VITALS — BP 110/62 | HR 60 | Temp 98.2°F | Resp 16 | Wt 127.6 lb

## 2022-09-08 DIAGNOSIS — Z8507 Personal history of malignant neoplasm of pancreas: Secondary | ICD-10-CM | POA: Insufficient documentation

## 2022-09-08 DIAGNOSIS — C250 Malignant neoplasm of head of pancreas: Secondary | ICD-10-CM

## 2022-09-08 DIAGNOSIS — Z452 Encounter for adjustment and management of vascular access device: Secondary | ICD-10-CM | POA: Insufficient documentation

## 2022-09-08 DIAGNOSIS — Z95828 Presence of other vascular implants and grafts: Secondary | ICD-10-CM

## 2022-09-08 DIAGNOSIS — Z08 Encounter for follow-up examination after completed treatment for malignant neoplasm: Secondary | ICD-10-CM | POA: Diagnosis not present

## 2022-09-08 LAB — CBC WITH DIFFERENTIAL/PLATELET
Abs Immature Granulocytes: 0 10*3/uL (ref 0.00–0.07)
Basophils Absolute: 0 10*3/uL (ref 0.0–0.1)
Basophils Relative: 0 %
Eosinophils Absolute: 0.1 10*3/uL (ref 0.0–0.5)
Eosinophils Relative: 2 %
HCT: 38.2 % (ref 36.0–46.0)
Hemoglobin: 12.7 g/dL (ref 12.0–15.0)
Immature Granulocytes: 0 %
Lymphocytes Relative: 50 %
Lymphs Abs: 2.7 10*3/uL (ref 0.7–4.0)
MCH: 30.7 pg (ref 26.0–34.0)
MCHC: 33.2 g/dL (ref 30.0–36.0)
MCV: 92.3 fL (ref 80.0–100.0)
Monocytes Absolute: 0.6 10*3/uL (ref 0.1–1.0)
Monocytes Relative: 11 %
Neutro Abs: 2 10*3/uL (ref 1.7–7.7)
Neutrophils Relative %: 37 %
Platelets: 154 10*3/uL (ref 150–400)
RBC: 4.14 MIL/uL (ref 3.87–5.11)
RDW: 13.4 % (ref 11.5–15.5)
WBC: 5.4 10*3/uL (ref 4.0–10.5)
nRBC: 0 % (ref 0.0–0.2)

## 2022-09-08 LAB — COMPREHENSIVE METABOLIC PANEL
ALT: 24 U/L (ref 0–44)
AST: 29 U/L (ref 15–41)
Albumin: 4.3 g/dL (ref 3.5–5.0)
Alkaline Phosphatase: 69 U/L (ref 38–126)
Anion gap: 4 — ABNORMAL LOW (ref 5–15)
BUN: 5 mg/dL — ABNORMAL LOW (ref 8–23)
CO2: 32 mmol/L (ref 22–32)
Calcium: 9.1 mg/dL (ref 8.9–10.3)
Chloride: 106 mmol/L (ref 98–111)
Creatinine, Ser: 0.58 mg/dL (ref 0.44–1.00)
GFR, Estimated: >60 mL/min (ref >60)
Glucose, Bld: 115 mg/dL — ABNORMAL HIGH (ref 70–99)
Potassium: 3.8 mmol/L (ref 3.5–5.1)
Sodium: 142 mmol/L (ref 135–145)
Total Bilirubin: 0.4 mg/dL (ref 0.3–1.2)
Total Protein: 6.7 g/dL (ref 6.5–8.1)

## 2022-09-08 MED ORDER — SODIUM CHLORIDE 0.9% FLUSH
10.0000 mL | Freq: Once | INTRAVENOUS | Status: AC
  Administered 2022-09-08: 10 mL

## 2022-09-08 MED ORDER — HEPARIN SOD (PORK) LOCK FLUSH 100 UNIT/ML IV SOLN
500.0000 [IU] | Freq: Once | INTRAVENOUS | Status: AC
  Administered 2022-09-08: 500 [IU]

## 2022-09-08 NOTE — Progress Notes (Signed)
Patient Care Team: Jackelyn Poling, DO as PCP - General (Family Medicine) Malachy Mood, MD as Attending Physician (Hematology and Oncology) Willis Modena, MD as Consulting Physician (Gastroenterology) Almond Lint, MD as Consulting Physician (General Surgery) Anabel Bene, RD as Dietitian (Nutrition) Romualdo Bolk, MD as Consulting Physician (Obstetrics and Gynecology)   CHIEF COMPLAINT: Follow-up pancreatic cancer  Oncology History Overview Note  Cancer Staging Pancreatic cancer [facility]) Staging form: Exocrine Pancreas, AJCC 8th Edition - Clinical stage from 10/16/2019: Stage IIB (cT2, cN1, cM0) - Signed by Malachy Mood, MD on 10/16/2019    Pancreatic cancer (HCC)  10/03/2019 Imaging   US Abdomen 10/03/19  IMPRESSION: Markedly dilated bile ducts. Dilated gallbladder with sludge but no gallstones.   2.9 cm mass in the uncinate process most likely pancreatic neoplasm causing biliary obstruction. Recommend MRI of the pancreas without with contrast for further evaluation. If the patient cannot have MRI, CT of the pancreas with contrast recommended.   10/07/2019 Imaging   MRI Abdomen 10/07/19  IMPRESSION: 1. There is a hypoenhancing mass of the pancreatic uncinate with abrupt obstruction of the central common bile duct, measuring approximately 2.9 cm, consistent with pancreatic adenocarcinoma. 2. Gross intra and extrahepatic biliary ductal dilatation, common bile duct measuring 1.4 cm. Gallbladder hydrops. 3. The pancreatic duct is nondilated. 4. Pancreatic mass lies closely adjacent to the most central portions of the superior mesenteric vein and central portal vein, due to motion artifact it is difficult to clearly discern whether there is a preserved fat plane. Multiphasic contrast enhanced pancreatic protocol CT may be less motion sensitive and better delineate vascular structures for the purposes of surgical planning if necessary. 5. No evidence of lymphadenopathy or  metastatic disease in the abdomen.   10/10/2019 Procedure   EUS by Dr Dulce Sellar 10/10/19  IMPRESSION - A mass was identified at junction of head/uncinate process of the pancreas. This was staged T2 N1 Mx by endosonographic criteria. Fine needle aspiration performed. - A few abnormal lymph nodes were visualized in the peripancreatic region. - There was dilation in the common bile duct which measured up to 14 mm. - Hyperechoic material consistent with sludge was visualized endosonographically in the gallbladder.   10/10/2019 Procedure   ERCP by Dr Dulce Sellar 10/10/19  IMPRESSION - The major papilla appeared normal. - One temporary stent was placed into the ventral pancreatic duct. - A biliary sphincterotomy was performed. - One covered metal stent was placed into the common bile duct.   10/10/2019 Initial Biopsy   FINAL MICROSCOPIC DIAGNOSIS: 10/10/19  A. PANCREAS, HEAD, FINE NEEDLE ASPIRATION:  - Malignant cells present, consistent with adenocarcinoma    10/16/2019 Initial Diagnosis   Pancreatic cancer (HCC)   10/16/2019 Cancer Staging   Staging form: Exocrine Pancreas, AJCC 8th Edition - Clinical stage from 10/16/2019: Stage IIB (cT2, cN1, cM0) - Signed by Malachy Mood, MD on 10/16/2019   10/23/2019 Imaging   CT CAP W contrast  IMPRESSION: 1. Pancreatic mass with local adenopathy and potential retroperitoneal adenopathy. Given retroperitoneal lymph nodes and presence of venous collaterals PET scan may be useful for staging purposes. 2. No definite vascular involvement or signs of upper abdominal venous collaterals. Some stranding around the celiac may be due to recent biopsy. 3. Borderline enlarged RIGHT external iliac lymph node. Attention on follow-up. 4. No evidence of metastatic disease in the chest. 5. Signs of recent ERCP and stent placement.   10/25/2019 Procedure   PAC placed    11/09/2019 - 02/20/2020 Chemotherapy  Neoadjuvant FOLFIRINOX q2weeks starting 11/09/19-02/20/20 for 8  cycles   11/28/2019 Genetic Testing   Negative genetic testing on the common hereditary cancer panel, pancreatic cancer panel, preliminary evidence pancreatic cancer panel and chronic pancreatitis panel.  The Common Hereditary Gene Panel offered by [facility] includes sequencing and/or deletion duplication testing of the following 55 genes: APC*, ATM*, AXIN2, BARD1, BMPR1A, BRCA1, BRCA2, BRIP1, CASR, CDH1, CDK4, CDKN2A (p14ARF), CDKN2A (p16INK4a), CFTR*, CHEK2, CPA1, CTNNA1, CTRC, DICER1*, EPCAM*, FANCC, GREM1*, HOXB13, KIT, MEN1*, MLH1*, MSH2*, MSH3*, MSH6*, MUTYH, NBN, NF1*, NTHL1, PALB2, PALLD, PDGFRA, PMS2*, POLD1*, POLE, PRSS1*, PTEN*, RAD50, RAD51C, RAD51D, SDHA*, SDHB, SDHC*, SDHD, SMAD4, SMARCA4, SPINK1, STK11, TP53, TSC1*, TSC2, and VHL .  The report date is November 28, 2019.   01/28/2020 Imaging   CT AP w contrast  IMPRESSION: 1. No substantial interval change in exam. 2. Similar appearance of the pancreatic mass with associated main pancreatic ductal dilatation. 3. Soft tissue stranding again noted around the celiac axis, in the para-aortic retroperitoneal space and along the IMA (new in the interval). These findings are associated with similar appearance of upper normal to borderline hepatoduodenal ligament and retroperitoneal lymphadenopathy. Close continued attention on follow-up recommended. 4. Borderline enlarged right external iliac node identified on previous study has decreased in size in the interval.   02/14/2020 PET scan   IMPRESSION: 1. The pancreatic mass has a maximum SUV of 6.1. No findings of hypermetabolic local adenopathy or distant metastatic spread. 2. Splenomegaly. 3. Inspissated barium or appendicolith within the appendix. No signs of appendiceal inflammation. 4. Scattered sigmoid colon diverticula. ADDENDUM: The original report was by Dr. Gaylyn Rong. The following addendum is by Dr. Gaylyn Rong:   On the CT from 01/28/2020 there were some upper  normal sized/borderline retroperitoneal lymph nodes including a 0.8 cm aortocaval node on image 72 of series 9. This currently has a maximum SUV of 2.2, which is similar to blood pool activity. An indistinctly marginated lymph node just below the left renal vein has a maximum SUV of 2.4, just above blood pool levels., and measures about 1.0 cm in diameter. Lymph nodes anterior to the left psoas muscle have maximum SUV of 2.0, similar to blood pool. Overall these lymph nodes are borderline enlarged and with activity level at or just minimally above blood pool. While certainly not grossly positive, their proximity to the pancreatic head mass and borderline imaging characteristics make it difficult to exclude early nodal involvement by malignancy.   03/24/2020 Surgery   Whipple surgery, Laparoscopic Diagnostic, Appendectomy by Dr Donell Beers    03/24/2020 Pathology Results   FINAL MICROSCOPIC DIAGNOSIS:   A. WHIPPLE PROCEDURE, WITH GALLBLADDER:  - Adenocarcinoma, moderately differentiated, spanning 1 cm.  - Tumor involves retroperitoneal peripancreatic soft tissue.  - Resection margins are negative.  - Treatment effect present.  - Rare tumor cells in one of fourteen lymph nodes (1/14).  - Benign gallbladder with chronic inflammation and benign lymph node.  - Bile duct with granulation tissue and reactive changes.  - Benign appendix.  - Mild reactive gastropathy.  - See oncology table.   B. LYMPH NODE, PORTAL, EXCISION:  - Three of three lymph nodes negative for carcinoma (0/3).   C. LYMPH NODE, COMMON HEPATIC ARTERY, EXCISION:  - Three of three lymph nodes negative for carcinoma (0/3).   D. APPENDIX, APPENDECTOMY:  - Benign appendix.   E. LYMPH NODE, AORTOCAVAL, EXCISION:  - One of one lymph node negative for carcinoma (0/1).    03/24/2020 Cancer Staging   Staging  form: Exocrine Pancreas, AJCC 8th Edition - Pathologic stage from 03/24/2020: Stage IIB (pT1c, pN1, cM0) - Signed by  Malachy Mood, MD on 04/16/2020   04/14/2020 Imaging   CT AP  IMPRESSION: 1. Interval Whipple procedure. Pancreatic stent in place. 2. Ill-defined soft tissue in the right abdominal wall extending from the subcutaneous tissues to the anterior abdominal wall musculature, but no focal fluid collection. This may represent postsurgical change, sterility indeterminate by imaging. 3. Pelvic anatomy is poorly defined, including pelvic bowel loops, adnexa, and colon. There appears to be sigmoid colonic wall thickening as well as intraluminal fluid, with additional diffuse colonic wall thickening from the transverse colon distally. Findings are suggestive of generalized colitis. Attention to this area on follow-up is recommended. 4. Small amount of non organized free fluid in the right upper quadrant. No evidence of intra-abdominal abscess. 5. Periportal edema, nonspecific. Previous biliary stent has been removed. Common bile duct is not well-defined on the current exam. 6. Mild splenomegaly. 7. Trace right pleural effusion.   05/19/2020 - 08/04/2020 Chemotherapy   Adjuvant Gemcitabine 2 weeks on 1 week off starting 05/19/20 and Xeloda started on 05/25/2020. Xeloda dose currently at 1500mg  in the AM and 1000mg  in the PM. Completed 08/04/20.    08/18/2020 Imaging   CT CAP  IMPRESSION: 1. Interval improvement in previously demonstrated ascites and generalized mesenteric edema. No peritoneal nodularity or focal extraluminal fluid collection identified. 2. Stable postsurgical changes from previous Whipple procedure. No evidence of local recurrence or metastatic disease. 3. Pancreatic stent remains in place without pancreatic ductal dilatation. Although there is no pneumobilia, there is no significant biliary dilatation. 4. Stable mild splenomegaly. 5. No significant findings in the chest.   12/08/2020 Imaging   CT CAP  IMPRESSION: 1. Status post Whipple pancreaticoduodenectomy. 2. There are  numerous newly enlarged, matted appearing retroperitoneal lymph nodes, largest left retroperitoneal nodes measuring up to 1.9 x 1.2 cm. Findings are highly concerning for nodal metastatic disease. 3. Main pancreatic duct stent remains within the pancreatic duct but advanced several centimeters into the jejunal efferent limb when compared to prior examination. There is new, diffuse pancreatic ductal dilatation, measuring up to 6 mm. 4. No evidence of organ metastatic disease or distant metastatic disease in the chest. 5. Small volume perihepatic ascites, unchanged, suspicious for malignant ascites, although without direct evidence of omental or peritoneal metastatic disease. 6. Coronary artery disease.   12/22/2020 PET scan   IMPRESSION: No evidence of recurrent or metastatic carcinoma.      CURRENT THERAPY: Surveillance  INTERVAL HISTORY Ms. Oum returns for follow-up as scheduled, last seen by Dr. Mosetta Putt 03/09/22 and by Dr. Donell Beers 06/03/2022.  She was having chest pain episodes that were atypical for reflux and referred to cardiology 06/21/2022, she had some workup which showed plaque but no blockage and atorvastatin was recommended.  Serial CA 19-9's have been stable, most recently 54 on 06/02/2022.  She had surveillance scan on 5/3 and is here for follow-up.  Doing well and feeling good in general.  Denies unintentional weight loss, fatigue, new abdominal pain/bloating, change in bowel habits, jaundice, or any other new specific complaints.  ROS  All other systems reviewed and negative  Past Medical History:  Diagnosis Date   Cataracts, bilateral    Diverticulosis 12/07/2001   found on colonoscopy   Family history of colon cancer    Family history of pancreatic cancer    Hypothyroid 10/2007   Macular hole    pancreatic ca 08/2019  pancreatic   Pancreatic mass    PONV (postoperative nausea and vomiting)      Past Surgical History:  Procedure Laterality Date   APPENDECTOMY N/A  03/24/2020   Procedure: APPENDECTOMY;  Surgeon: Almond Lint, MD;  Location: [facility] OR;  Service: General;  Laterality: N/A;   BILIARY STENT PLACEMENT  10/10/2019   Procedure: BILIARY STENT PLACEMENT;  Surgeon: Willis Modena, MD;  Location: [facility] ENDOSCOPY;  Service: Endoscopy;;   CATARACT EXTRACTION, BILATERAL     COLONOSCOPY     ERCP N/A 10/10/2019   Procedure: ENDOSCOPIC RETROGRADE CHOLANGIOPANCREATOGRAPHY (ERCP);  Surgeon: Willis Modena, MD;  Location: [facility] ENDOSCOPY;  Service: Endoscopy;  Laterality: N/A;   ESOPHAGOGASTRODUODENOSCOPY N/A 10/10/2019   Procedure: ESOPHAGOGASTRODUODENOSCOPY (EGD);  Surgeon: Willis Modena, MD;  Location: [facility] ENDOSCOPY;  Service: Endoscopy;  Laterality: N/A;   EUS N/A 10/10/2019   Procedure: UPPER ENDOSCOPIC ULTRASOUND (EUS) LINEAR;  Surgeon: Willis Modena, MD;  Location: [facility] ENDOSCOPY;  Service: Endoscopy;  Laterality: N/A;   EYE SURGERY     FINE NEEDLE ASPIRATION  10/10/2019   Procedure: FINE NEEDLE ASPIRATION (FNA) LINEAR;  Surgeon: Willis Modena, MD;  Location: [facility] ENDOSCOPY;  Service: Endoscopy;;   IR IMAGING GUIDED PORT INSERTION  10/25/2019   LAPAROSCOPY N/A 03/24/2020   Procedure: LAPAROSCOPY DIAGNOSTIC;  Surgeon: Almond Lint, MD;  Location: [facility] OR;  Service: General;  Laterality: N/A;   Macular hole surg     PANCREATIC STENT PLACEMENT  10/10/2019   Procedure: PANCREATIC STENT PLACEMENT;  Surgeon: Willis Modena, MD;  Location[facility] ENDOSCOPY;  Service: Endoscopy;;   SPHINCTEROTOMY  10/10/2019   Procedure: Dennison Mascot;  Surgeon: Willis Modena, MD;  Location: [facility] ENDOSCOPY;  Service: Endoscopy;;   TONSILLECTOMY     TONSILLECTOMY     WHIPPLE PROCEDURE N/A 03/24/2020   Procedure: WHIPPLE PROCEDURE;  Surgeon: Almond Lint, MD;  Location: [facility] OR;  Service: General;  Laterality: N/A;     Outpatient Encounter Medications as of 09/08/2022  Medication Sig   Ascorbic Acid (VITAMIN C PO) Take 1,000 mg by mouth daily.    atorvastatin (LIPITOR) 10 MG tablet Take  1 tablet (10 mg total) by mouth daily.   Calcium Carb-Cholecalciferol (CALCIUM 1000 + D PO) Take 1 tablet by mouth daily.    ferrous sulfate 325 (65 FE) MG tablet Take 325 mg by mouth daily with breakfast.   ibuprofen (ADVIL) 200 MG tablet Take 400 mg by mouth every 6 (six) hours as needed for moderate pain.   levothyroxine (SYNTHROID, LEVOTHROID) 50 MCG tablet Take 1 tablet (50 mcg total) by mouth daily.   Multiple Vitamin (MULTIVITAMIN PO) Take 1 tablet by mouth daily.   vitamin B-12 (CYANOCOBALAMIN) 1000 MCG tablet Take 1,000 mcg by mouth daily.   metoprolol tartrate (LOPRESSOR) 50 MG tablet Take 50 mg (1 tablet) TWO hours prior to CT scan   No facility-administered encounter medications on file as of 09/08/2022.     Today's Vitals   09/08/22 1002 09/08/22 1006  BP:  110/62  Pulse:  60  Resp:  16  Temp:  98.2 F (36.8 C)  SpO2:  100%  Weight:  127 lb 9.6 oz (57.9 kg)  PainSc: 0-No pain    Body mass index is 20.91 kg/m.   PHYSICAL EXAM GENERAL:alert, no distress and comfortable SKIN: no rash  EYES: sclera clear NECK: without mass LYMPH:  no palpable cervical or supraclavicular lymphadenopathy  LUNGS: clear with normal breathing effort HEART: regular rate & rhythm, no lower extremity edema ABDOMEN: abdomen soft, non-tender and normal  bowel sounds.  Midline surgical scar without nodularity NEURO: alert & oriented x 3 with fluent speech, no focal motor/sensory deficits PAC without erythema    CBC    Component Value Date/Time   WBC 5.4 09/08/2022 0925   RBC 4.14 09/08/2022 0925   HGB 12.7 09/08/2022 0925   HGB 13.6 09/02/2021 0740   HCT 38.2 09/08/2022 0925   PLT 154 09/08/2022 0925   PLT 175 09/02/2021 0740   MCV 92.3 09/08/2022 0925   MCH 30.7 09/08/2022 0925   MCHC 33.2 09/08/2022 0925   RDW 13.4 09/08/2022 0925   RDW 14.0 12/10/2012 1029   LYMPHSABS 2.7 09/08/2022 0925   LYMPHSABS 2.1 12/10/2012 1029   MONOABS 0.6 09/08/2022 0925   EOSABS 0.1 09/08/2022 0925    EOSABS 0.1 12/10/2012 1029   BASOSABS 0.0 09/08/2022 0925   BASOSABS 0.0 12/10/2012 1029     CMP     Component Value Date/Time   NA 142 09/08/2022 0925   NA 142 06/21/2022 1607   K 3.8 09/08/2022 0925   CL 106 09/08/2022 0925   CO2 32 09/08/2022 0925   GLUCOSE 115 (H) 09/08/2022 0925   BUN 5 (L) 09/08/2022 0925   BUN 7 (L) 06/21/2022 1607   CREATININE 0.58 09/08/2022 0925   CREATININE 0.58 09/02/2021 0740   CALCIUM 9.1 09/08/2022 0925   PROT 6.7 09/08/2022 0925   PROT 6.9 12/10/2012 1029   ALBUMIN 4.3 09/08/2022 0925   ALBUMIN 4.5 12/10/2012 1029   AST 29 09/08/2022 0925   AST 30 09/02/2021 0740   ALT 24 09/08/2022 0925   ALT 25 09/02/2021 0740   ALKPHOS 69 09/08/2022 0925   BILITOT 0.4 09/08/2022 0925   BILITOT 0.5 09/02/2021 0740   GFRNONAA >60 09/08/2022 0925   GFRNONAA >60 09/02/2021 0740   GFRAA >60 02/03/2020 1105     ASSESSMENT & PLAN: 62 year old female    1. Pancreatic Cancer, Stage IIB, cT2N1M0, ypT1N1 -Diagnosed 10/2019 by EUS with ERCP by Dr. Dulce Sellar.  Pancreatic mass biopsy showed adenocarcinoma.  Endoscopic staging T2 N1 without vascular involvement. -Baseline CA 19-9 elevated at 883. -S/p 8 cycles of neoadjuvant FOLFIRINOX from 11/09/2019 - 02/20/2020, tolerated moderately well  -S/p Whipple surgery with Dr. Donell Beers on 03/24/2020, path showed a residual 1 cm moderately differentiated adenocarcinoma involving RP peripancreatic soft tissue and rare tumor cells in 1/14 positive lymph nodes, margins negative -She completed 3 months adjuvant chemo with gemcitabine/Xeloda 05/19/20 - 08/04/20 then went on surveillance  -CA 19-9 mildly elevated in 40-50s range since 09/2020 -A CT CAP on 12/08/2020 showed new retroperitoneal lymph nodes that were negative on PET.  These have been stable/improving in the interim -Ms. Nakayama is clinically doing well.  Exam is benign, CBC/CMP are unremarkable.  We will follow-up the pending CA 19-9 from today - I reviewed the surveillance CT CAP  from 09/02/2022 which shows no evidence of recurrent or progressive disease.  The mildly enlarged retroperitoneal lymph nodes are stable. Repeat in 1 year -She remains in clinical remission.  She is 2 years from completing all therapy.  Continue surveillance and healthy active lifestyle.  She prefers to keep her port in for now, will continue to flush q2 months -I suggest to f/up with Dr. Donell Beers in 3 months if she does not already have an appointment scheduled -Lab in 4 and 6 months, with follow-up with [place] in 6 months -I reviewed signs and symptoms of recurrence, she will notify us sooner if needed   2. Genetic Testing -  negative result 11/28/19  PLAN: -Recent CT CAP and today's labs reviewed, will f/up the pending CA 19-9 -Continue pancreatic cancer surveillance  -Flush q2 months, lab in 4 and 6 months -F/up with Dr. Donell Beers in 3 months and with [place] in 6 months, or sooner if needed   All questions were answered. The patient knows to call the clinic with any problems, questions or concerns. No barriers to learning were detected. I spent 20 minutes counseling the patient face to face. The total time spent in the appointment was 30 minutes and more than 50% was on counseling, review of test results, and coordination of care.   Santiago Glad, NP-C 09/08/2022

## 2022-09-10 LAB — CANCER ANTIGEN 19-9: CA 19-9: 38 U/mL — ABNORMAL HIGH (ref 0–35)

## 2022-09-21 ENCOUNTER — Other Ambulatory Visit: Payer: Self-pay | Admitting: Family Medicine

## 2022-09-21 DIAGNOSIS — Z1231 Encounter for screening mammogram for malignant neoplasm of breast: Secondary | ICD-10-CM

## 2022-09-22 ENCOUNTER — Ambulatory Visit: Payer: BC Managed Care – PPO | Attending: Cardiology | Admitting: Cardiology

## 2022-09-22 ENCOUNTER — Encounter: Payer: Self-pay | Admitting: Cardiology

## 2022-09-22 VITALS — BP 118/76 | HR 60 | Ht 65.0 in | Wt 129.2 lb

## 2022-09-22 DIAGNOSIS — I2510 Atherosclerotic heart disease of native coronary artery without angina pectoris: Secondary | ICD-10-CM

## 2022-09-22 DIAGNOSIS — R9431 Abnormal electrocardiogram [ECG] [EKG]: Secondary | ICD-10-CM

## 2022-09-22 NOTE — Patient Instructions (Signed)
Medication Instructions:  Your physician recommends that you continue on your current medications as directed. Please refer to the Current Medication list given to you today.  *If you need a refill on your cardiac medications before your next appointment, please call your pharmacy*  Lab Work: Lipid today  If you have labs (blood work) drawn today and your tests are completely normal, you will receive your results only by: MyChart Message (if you have MyChart) OR A paper copy in the mail If you have any lab test that is abnormal or we need to change your treatment, we will call you to review the results.  Follow-Up: At [facility], you and your health needs are our priority.  As part of our continuing mission to provide you with exceptional heart care, we have created designated Provider Care Teams.  These Care Teams include your primary Cardiologist (physician) and Advanced Practice Providers (APPs -  Physician Assistants and Nurse Practitioners) who all work together to provide you with the care you need, when you need it.  We recommend signing up for the patient portal called "MyChart".  Sign up information is provided on this After Visit Summary.  MyChart is used to connect with patients for Virtual Visits (Telemedicine).  Patients are able to view lab/test results, encounter notes, upcoming appointments, etc.  Non-urgent messages can be sent to your provider as well.   To learn more about what you can do with MyChart, go to [email or web address].    Your next appointment:   12 month(s)  Provider:   Dr. Schumann  

## 2022-09-22 NOTE — Progress Notes (Signed)
Cardiology Office Note:    Date:  09/22/2022   ID:  Breanna Noble, DOB 03/07/1961, MRN [id number]  PCP:  Jackelyn Poling, DO  Cardiologist:  Little Ishikawa, MD  Electrophysiologist:  None   Referring MD: No ref. provider found   No chief complaint on file.   History of Present Illness:    Breanna Noble is a 62 y.o. female with a hx of CAD, pancreatic cancer, hypothyroidism who presents for follow-up.  She was referred by Dr. Donell Beers for evaluation of chest pain, initially seen 06/21/2022.  She reports has had 2 episodes of chest pain.  Describes as occurring in center chest, initially dull aching pain that then became sharp.  Dull pain lasted for hours while sharp pain lasted for minutes.  Occurred at rest.  She rides stationary bike daily for 30 minutes, denies any chest pain with this.  She denies any dyspnea, lightheadedness, syncope, lower extremity edema.  Reports rare palpitations.  No smoking history.  Family history includes father had MI in 74s.  Coronary CTA 06/29/2022 showed nonobstructive CAD with 25 to 49% stenosis in proximal LAD, calcium score 78 (86 percentile).  Echocardiogram 07/14/2022 showed EF 65 to 70%, normal RV function, no significant valvular disease.  Since last clinic visit, she reports that she is doing well.  Denies any chest pain, dyspnea, lightheadedness, syncope, lower extremity edema, or palpitations.  She rides stationary bike for 25 to 30 minutes for 5 days/week, denies any exertional symptoms.   Past Medical History:  Diagnosis Date   Cataracts, bilateral    Diverticulosis 12/07/2001   found on colonoscopy   Family history of colon cancer    Family history of pancreatic cancer    Hypothyroid 10/2007   Macular hole    pancreatic ca 08/2019   pancreatic   Pancreatic mass    PONV (postoperative nausea and vomiting)     Past Surgical History:  Procedure Laterality Date   APPENDECTOMY N/A 03/24/2020   Procedure: APPENDECTOMY;  Surgeon: Almond Lint, MD;  Location: [facility] OR;  Service: General;  Laterality: N/A;   BILIARY STENT PLACEMENT  10/10/2019   Procedure: BILIARY STENT PLACEMENT;  Surgeon: Willis Modena, MD;  Location: [facility] ENDOSCOPY;  Service: Endoscopy;;   CATARACT EXTRACTION, BILATERAL     COLONOSCOPY     ERCP N/A 10/10/2019   Procedure: ENDOSCOPIC RETROGRADE CHOLANGIOPANCREATOGRAPHY (ERCP);  Surgeon: Willis Modena, MD;  Location: [facility] ENDOSCOPY;  Service: Endoscopy;  Laterality: N/A;   ESOPHAGOGASTRODUODENOSCOPY N/A 10/10/2019   Procedure: ESOPHAGOGASTRODUODENOSCOPY (EGD);  Surgeon: Willis Modena, MD;  Location: [facility] ENDOSCOPY;  Service: Endoscopy;  Laterality: N/A;   EUS N/A 10/10/2019   Procedure: UPPER ENDOSCOPIC ULTRASOUND (EUS) LINEAR;  Surgeon: Willis Modena, MD;  Location: [facility] ENDOSCOPY;  Service: Endoscopy;  Laterality: N/A;   EYE SURGERY     FINE NEEDLE ASPIRATION  10/10/2019   Procedure: FINE NEEDLE ASPIRATION (FNA) LINEAR;  Surgeon: Willis Modena, MD;  Location: [facility] ENDOSCOPY;  Service: Endoscopy;;   IR IMAGING GUIDED PORT INSERTION  10/25/2019   LAPAROSCOPY N/A 03/24/2020   Procedure: LAPAROSCOPY DIAGNOSTIC;  Surgeon: Almond Lint, MD;  Location: [facility] OR;  Service: General;  Laterality: N/A;   Macular hole surg     PANCREATIC STENT PLACEMENT  10/10/2019   Procedure: PANCREATIC STENT PLACEMENT;  Surgeon: Willis Modena, MD;  Location: [facility] ENDOSCOPY;  Service: Endoscopy;;   SPHINCTEROTOMY  10/10/2019   Procedure: Dennison Mascot;  Surgeon: Willis Modena, MD;  Location: [facility] ENDOSCOPY;  Service: Endoscopy;;   TONSILLECTOMY  TONSILLECTOMY     WHIPPLE PROCEDURE N/A 03/24/2020   Procedure: WHIPPLE PROCEDURE;  Surgeon: Almond Lint, MD;  Location: [facility] OR;  Service: General;  Laterality: N/A;    Current Medications: Current Meds  Medication Sig   Ascorbic Acid (VITAMIN C PO) Take 1,000 mg by mouth daily.    atorvastatin (LIPITOR) 10 MG tablet Take 1 tablet (10 mg total) by mouth daily.   Calcium Carb-Cholecalciferol  (CALCIUM 1000 + D PO) Take 1 tablet by mouth daily.    ferrous sulfate 325 (65 FE) MG tablet Take 325 mg by mouth daily with breakfast.   ibuprofen (ADVIL) 200 MG tablet Take 400 mg by mouth every 6 (six) hours as needed for moderate pain.   levothyroxine (SYNTHROID, LEVOTHROID) 50 MCG tablet Take 1 tablet (50 mcg total) by mouth daily.   Multiple Vitamin (MULTIVITAMIN PO) Take 1 tablet by mouth daily.   vitamin B-12 (CYANOCOBALAMIN) 1000 MCG tablet Take 1,000 mcg by mouth daily.     Allergies:   Patient has no known allergies.   Social History   Socioeconomic History   Marital status: Married    Spouse name: Not on file   Number of children: 2   Years of education: Not on file   Highest education level: Not on file  Occupational History   Not on file  Tobacco Use   Smoking status: Never   Smokeless tobacco: Never  Vaping Use   Vaping Use: Never used  Substance and Sexual Activity   Alcohol use: No    Alcohol/week: 0.0 standard drinks of alcohol   Drug use: No   Sexual activity: Yes    Birth control/protection: Post-menopausal    Comment: 1st intercourse 68 yo-2 partners  Other Topics Concern   Not on file  Social History Narrative   Not on file   Social Determinants of Health   Financial Resource Strain: Not on file  Food Insecurity: Not on file  Transportation Needs: Not on file  Physical Activity: Not on file  Stress: Not on file  Social Connections: Not on file     Family History: The patient's family history includes Cancer in her maternal grandfather and maternal grandmother; Cancer (age of onset: 20) in her maternal uncle; Dementia in her maternal uncle and paternal grandmother; Diabetes in her paternal grandfather; Heart disease in her father and maternal grandfather; Hypertension in her father; Other in her mother; Stroke in her paternal uncle. There is no history of Breast cancer.  ROS:   Please see the history of present illness.     All other systems  reviewed and are negative.  EKGs/Labs/Other Studies Reviewed:    The following studies were reviewed today:   EKG:   06/21/2022: Normal sinus rhythm, rate 72, 1 mm ST depressions in inferior leads and V5/6, right axis deviation  Recent Labs: 09/08/2022: ALT 24; BUN 5; Creatinine, Ser 0.58; Hemoglobin 12.7; Platelets 154; Potassium 3.8; Sodium 142  Recent Lipid Panel    Component Value Date/Time   CHOL 130 06/21/2022 1607   TRIG 118 06/21/2022 1607   HDL 49 06/21/2022 1607   CHOLHDL 2.7 06/21/2022 1607   LDLCALC 60 06/21/2022 1607    Physical Exam:    VS:  BP 118/76   Pulse 60   Ht 5\' 5"  (1.651 m)   Wt 129 lb 3.2 oz (58.6 kg)   LMP 11/22/2013   SpO2 99%   BMI 21.50 kg/m     Wt Readings from Last 3 Encounters:  09/22/22 129 lb 3.2 oz (58.6 kg)  09/08/22 127 lb 9.6 oz (57.9 kg)  06/21/22 124 lb 12.8 oz (56.6 kg)     GEN:  Well nourished, well developed in no acute distress HEENT: Normal NECK: No JVD; No carotid bruits LYMPHATICS: No lymphadenopathy CARDIAC: RRR, no murmurs, rubs, gallops RESPIRATORY:  Clear to auscultation without rales, wheezing or rhonchi  ABDOMEN: Soft, non-tender, non-distended MUSCULOSKELETAL:  No edema; No deformity  SKIN: Warm and dry NEUROLOGIC:  Alert and oriented x 3 PSYCHIATRIC:  Normal affect   ASSESSMENT:    1. CAD in native artery   2. Nonspecific abnormal electrocardiogram (ECG) (EKG)    PLAN:    CAD: Reported atypical chest pain. Coronary CTA 06/29/2022 showed nonobstructive CAD with 25 to 49% stenosis in proximal LAD, calcium score 78 (86 percentile).  Echocardiogram 07/14/2022 showed EF 65 to 70%, normal RV function, no significant valvular disease. -Started atorvastatin 10 mg daily.  Check lipid panel  Abnormal EKG: Inferior ST depressions and right axis deviation.  Echocardiogram unremarkable as above   RTC in 1 year   Medication Adjustments/Labs and Tests Ordered: Current medicines are reviewed at length with the  patient today.  Concerns regarding medicines are outlined above.  Orders Placed This Encounter  Procedures   Lipid panel   No orders of the defined types were placed in this encounter.   Patient Instructions  Medication Instructions:  Your physician recommends that you continue on your current medications as directed. Please refer to the Current Medication list given to you today.   *If you need a refill on your cardiac medications before your next appointment, please call your pharmacy*  Lab Work: Lipid today  If you have labs (blood work) drawn today and your tests are completely normal, you will receive your results only by: MyChart Message (if you have MyChart) OR A paper copy in the mail If you have any lab test that is abnormal or we need to change your treatment, we will call you to review the results.  Follow-Up: At [facility], you and your health needs are our priority.  As part of our continuing mission to provide you with exceptional heart care, we have created designated Provider Care Teams.  These Care Teams include your primary Cardiologist (physician) and Advanced Practice Providers (APPs -  Physician Assistants and Nurse Practitioners) who all work together to provide you with the care you need, when you need it.  We recommend signing up for the patient portal called "MyChart".  Sign up information is provided on this After Visit Summary.  MyChart is used to connect with patients for Virtual Visits (Telemedicine).  Patients are able to view lab/test results, encounter notes, upcoming appointments, etc.  Non-urgent messages can be sent to your provider as well.   To learn more about what you can do with MyChart, go to [email or web address].    Your next appointment:   12 month(s)  Provider:   Dr. Bjorn Pippin   Signed, Little Ishikawa, MD  09/22/2022 12:54 PM    [facility]

## 2022-09-23 LAB — LIPID PANEL
Chol/HDL Ratio: 2 ratio (ref 0.0–4.4)
Cholesterol, Total: 99 mg/dL — ABNORMAL LOW (ref 100–199)
HDL: 49 mg/dL (ref >39)
LDL Chol Calc (NIH): 36 mg/dL (ref 0–99)
Triglycerides: 64 mg/dL (ref 0–149)
VLDL Cholesterol Cal: 14 mg/dL (ref 5–40)

## 2022-11-10 ENCOUNTER — Other Ambulatory Visit: Payer: Self-pay

## 2022-11-10 ENCOUNTER — Inpatient Hospital Stay: Payer: BC Managed Care – PPO | Attending: Hematology

## 2022-11-10 DIAGNOSIS — Z95828 Presence of other vascular implants and grafts: Secondary | ICD-10-CM

## 2022-11-10 DIAGNOSIS — Z452 Encounter for adjustment and management of vascular access device: Secondary | ICD-10-CM | POA: Insufficient documentation

## 2022-11-10 DIAGNOSIS — Z8507 Personal history of malignant neoplasm of pancreas: Secondary | ICD-10-CM | POA: Insufficient documentation

## 2022-11-10 MED ORDER — HEPARIN SOD (PORK) LOCK FLUSH 100 UNIT/ML IV SOLN
500.0000 [IU] | Freq: Once | INTRAVENOUS | Status: AC
  Administered 2022-11-10: 500 [IU]

## 2022-11-10 MED ORDER — SODIUM CHLORIDE 0.9% FLUSH
10.0000 mL | Freq: Once | INTRAVENOUS | Status: AC
  Administered 2022-11-10: 10 mL

## 2022-11-10 NOTE — Progress Notes (Signed)
Patient here for port flush. Says her port site was red and swollen last week and it is now bruised. Charge nurse Erin notified. Anise Salvo, RN assessed site. Her port was accessed and flushed, she denied any pain or discomfort during flush.

## 2022-12-02 DIAGNOSIS — Z95828 Presence of other vascular implants and grafts: Secondary | ICD-10-CM | POA: Diagnosis not present

## 2022-12-02 DIAGNOSIS — C250 Malignant neoplasm of head of pancreas: Secondary | ICD-10-CM | POA: Diagnosis not present

## 2022-12-08 ENCOUNTER — Ambulatory Visit
Admission: RE | Admit: 2022-12-08 | Discharge: 2022-12-08 | Disposition: A | Discharge status: Home / Self Care | Payer: BC Managed Care – PPO | Source: Ambulatory Visit | Attending: Family Medicine | Admitting: Family Medicine

## 2022-12-08 ENCOUNTER — Encounter: Payer: Self-pay | Admitting: Nurse Practitioner

## 2022-12-08 ENCOUNTER — Ambulatory Visit: Payer: BC Managed Care – PPO | Admitting: Nurse Practitioner

## 2022-12-08 VITALS — BP 112/64 | HR 69 | Ht 65.0 in | Wt 128.0 lb

## 2022-12-08 DIAGNOSIS — Z780 Asymptomatic menopausal state: Secondary | ICD-10-CM | POA: Diagnosis not present

## 2022-12-08 DIAGNOSIS — Z1231 Encounter for screening mammogram for malignant neoplasm of breast: Secondary | ICD-10-CM

## 2022-12-08 DIAGNOSIS — Z01419 Encounter for gynecological examination (general) (routine) without abnormal findings: Secondary | ICD-10-CM

## 2022-12-08 NOTE — Progress Notes (Signed)
Breanna Noble [birth year] [id number]   History:  62 y.o. G2P2 presents for annual exam. Postmenopausal. Having vaginal dryness and pain with intercourse. Prescribed vaginal estrogen last year but she did not start due to fear of side effects. Not interested at this time. Normal pap and mammogram history. Persistent unchanged right ovarian cyst first seen in 2018, normal CA-125. Most recent ultrasound 07/2021 for evaluation of PMB, thin endometrium, 1.1 cm right complex cyst (unchanged). Has annual CT scans for pancreatic cancer. Hypothyroidism managed by PCP. Diagnosed with pancreatic cancer 10/2019 stage 2B- whipple procedure with appendectomy, chemo x 2.   Gynecologic History Patient's last menstrual period was 11/22/2013.   Contraception/Family planning: post menopausal status Sexually active: Yes  Health Maintenance Last Pap: 08/05/2021. Results were: Normal neg HPV, 5-year repeat Last mammogram: 12/08/2022 (today). Results were: No report yet Last colonoscopy: 2023. Results were: Pre-cancerous polyp, 7-year recall Last Dexa:  11/16/2021. Results were: Normal  Past medical history, past surgical history, family history and social history were all reviewed and documented in the EPIC chart. Married. Retired. Son in [place], has 77 yo daughter. Daughter also in [place], has 4 and 77 yo sons. Keeps grandsons during the week.   ROS:  A ROS was performed and pertinent positives and negatives are included.  Exam:  Vitals:   12/08/22 1331  BP: 112/64  Pulse: 69  SpO2: 100%  Weight: 128 lb (58.1 kg)  Height: 5\' 5"  (1.651 m)     Body mass index is 21.3 kg/m.  General appearance:  Normal Thyroid:  Symmetrical, normal in size, without palpable masses or nodularity. Respiratory  Auscultation:  Clear without wheezing or rhonchi Cardiovascular  Auscultation:  Regular rate, without rubs, murmurs or gallops  Edema/varicosities:  Not grossly evident Abdominal  Soft,nontender, without  masses, guarding or rebound.  Liver/spleen:  No organomegaly noted  Hernia:  None appreciated  Skin  Inspection:  Grossly normal Breasts: Examined lying and sitting.   Right: Without masses, retractions, nipple discharge or axillary adenopathy.   Left: Without masses, retractions, nipple discharge or axillary adenopathy. Genitourinary   Inguinal/mons:  Normal without inguinal adenopathy  External genitalia:  Normal appearing vulva with no masses, tenderness, or lesions  BUS/Urethra/Skene's glands:  Normal  Vagina:  Normal appearing with normal color and discharge, no lesions. Atrophic changes  Cervix:  Normal appearing without discharge or lesions  Uterus:  Normal in size, shape and contour.  Midline and mobile, nontender  Adnexa/parametria:     Rt: Normal in size, without masses or tenderness.   Lt: Normal in size, without masses or tenderness.  Anus and perineum: Normal  Digital rectal exam: Not indicated  Patient informed chaperone available to be present for breast and pelvic exam. Patient has requested no chaperone to be present. Patient has been advised what will be completed during breast and pelvic exam.   Assessment/Plan:  62 y.o. G2P2 for annual exam.   Well female exam with routine gynecological exam - Education provided on SBEs, importance of preventative screenings, current guidelines, high calcium diet, regular exercise, and multivitamin daily.  Labs with PCP.   Postmenopausal - No HRT, no bleeding  Screening for cervical cancer - Normal Pap history.  Will repeat at 5-year interval per guidelines.  Screening for breast cancer - Normal mammogram history.  Continue annual screenings.  Normal breast exam today.   Screening for colon cancer - 2023 colonoscopy. Will repeat at 7-year interval per GI's recommendation.   Screening for osteoporosis - Normal bone density  10/2021.   Return in 1 year for annual.       Olivia Mackie DNP, 1:46 PM 12/08/2022

## 2023-01-04 DIAGNOSIS — R300 Dysuria: Secondary | ICD-10-CM | POA: Diagnosis not present

## 2023-02-13 ENCOUNTER — Encounter: Payer: Self-pay | Admitting: Hematology

## 2023-02-13 NOTE — Telephone Encounter (Signed)
error 

## 2023-02-15 ENCOUNTER — Encounter: Payer: Self-pay | Admitting: Hematology

## 2023-02-15 NOTE — Telephone Encounter (Signed)
TC

## 2023-03-16 ENCOUNTER — Inpatient Hospital Stay (HOSPITAL_BASED_OUTPATIENT_CLINIC_OR_DEPARTMENT_OTHER): Payer: BC Managed Care – PPO | Admitting: Nurse Practitioner

## 2023-03-16 ENCOUNTER — Inpatient Hospital Stay: Payer: BC Managed Care – PPO | Attending: Hematology

## 2023-03-16 ENCOUNTER — Other Ambulatory Visit: Payer: Self-pay

## 2023-03-16 ENCOUNTER — Encounter: Payer: Self-pay | Admitting: Nurse Practitioner

## 2023-03-16 VITALS — BP 113/70 | HR 62 | Temp 97.6°F | Resp 17 | Wt 127.6 lb

## 2023-03-16 DIAGNOSIS — C250 Malignant neoplasm of head of pancreas: Secondary | ICD-10-CM | POA: Diagnosis not present

## 2023-03-16 DIAGNOSIS — Z452 Encounter for adjustment and management of vascular access device: Secondary | ICD-10-CM | POA: Diagnosis not present

## 2023-03-16 DIAGNOSIS — R590 Localized enlarged lymph nodes: Secondary | ICD-10-CM | POA: Insufficient documentation

## 2023-03-16 DIAGNOSIS — Z95828 Presence of other vascular implants and grafts: Secondary | ICD-10-CM

## 2023-03-16 LAB — CBC WITH DIFFERENTIAL (CANCER CENTER ONLY)
Abs Immature Granulocytes: 0.01 10*3/uL (ref 0.00–0.07)
Basophils Absolute: 0 10*3/uL (ref 0.0–0.1)
Basophils Relative: 1 %
Eosinophils Absolute: 0.2 10*3/uL (ref 0.0–0.5)
Eosinophils Relative: 3 %
HCT: 38.6 % (ref 36.0–46.0)
Hemoglobin: 12.9 g/dL (ref 12.0–15.0)
Immature Granulocytes: 0 %
Lymphocytes Relative: 34 %
Lymphs Abs: 1.8 10*3/uL (ref 0.7–4.0)
MCH: 30.3 pg (ref 26.0–34.0)
MCHC: 33.4 g/dL (ref 30.0–36.0)
MCV: 90.6 fL (ref 80.0–100.0)
Monocytes Absolute: 0.8 10*3/uL (ref 0.1–1.0)
Monocytes Relative: 14 %
Neutro Abs: 2.7 10*3/uL (ref 1.7–7.7)
Neutrophils Relative %: 48 %
Platelet Count: 147 10*3/uL — ABNORMAL LOW (ref 150–400)
RBC: 4.26 MIL/uL (ref 3.87–5.11)
RDW: 13.4 % (ref 11.5–15.5)
WBC Count: 5.5 10*3/uL (ref 4.0–10.5)
nRBC: 0 % (ref 0.0–0.2)

## 2023-03-16 LAB — CMP (CANCER CENTER ONLY)
ALT: 19 U/L (ref 0–44)
AST: 24 U/L (ref 15–41)
Albumin: 4.2 g/dL (ref 3.5–5.0)
Alkaline Phosphatase: 74 U/L (ref 38–126)
Anion gap: 5 (ref 5–15)
BUN: 7 mg/dL — ABNORMAL LOW (ref 8–23)
CO2: 31 mmol/L (ref 22–32)
Calcium: 9.4 mg/dL (ref 8.9–10.3)
Chloride: 104 mmol/L (ref 98–111)
Creatinine: 0.59 mg/dL (ref 0.44–1.00)
GFR, Estimated: >60 mL/min (ref >60)
Glucose, Bld: 136 mg/dL — ABNORMAL HIGH (ref 70–99)
Potassium: 4 mmol/L (ref 3.5–5.1)
Sodium: 140 mmol/L (ref 135–145)
Total Bilirubin: 0.6 mg/dL (ref <1.2)
Total Protein: 6.8 g/dL (ref 6.5–8.1)

## 2023-03-16 MED ORDER — SODIUM CHLORIDE 0.9% FLUSH
10.0000 mL | Freq: Once | INTRAVENOUS | Status: AC
  Administered 2023-03-16: 10 mL

## 2023-03-16 MED ORDER — HEPARIN SOD (PORK) LOCK FLUSH 100 UNIT/ML IV SOLN
500.0000 [IU] | Freq: Once | INTRAVENOUS | Status: AC
  Administered 2023-03-16: 500 [IU]

## 2023-03-16 NOTE — Progress Notes (Signed)
Patient Care Team: Jackelyn Poling, DO as PCP - General (Family Medicine) Little Ishikawa, MD as PCP - Cardiology (Cardiology) Malachy Mood, MD as Attending Physician (Hematology and Oncology) Willis Modena, MD as Consulting Physician (Gastroenterology) Almond Lint, MD as Consulting Physician (General Surgery) Anabel Bene, RD as Dietitian (Nutrition)  Clinic Day:  03/16/2023  Referring physician: Jackelyn Poling, DO  ASSESSMENT & PLAN:   Assessment & Plan: Pancreatic cancer [facility])  Stage IIB, cT2N1M0, ypT1N1 -Diagnosed 10/2019 by EUS with ERCP by Dr. Dulce Sellar.  Pancreatic mass biopsy showed adenocarcinoma.  Endoscopic staging T2 N1 without vascular involvement. -Baseline CA 19-9 elevated at 883. -S/p 8 cycles of neoadjuvant FOLFIRINOX from 11/09/2019 - 02/20/2020, tolerated moderately well  -S/p Whipple surgery with Dr. Donell Beers on 03/24/2020, path showed a residual 1 cm moderately differentiated adenocarcinoma involving RP peripancreatic soft tissue and rare tumor cells in 1/14 positive lymph nodes, margins negative -She completed 3 months adjuvant chemo with gemcitabine/Xeloda 05/19/20 - 08/04/20 then went on surveillance  -CA 19-9 mildly elevated in 40-50s range since 09/2020 -A CT CAP on 12/08/2020 showed new retroperitoneal lymph nodes that were negative on PET.  These have been stable/improving in the interim -Breanna Noble is clinically doing well.  Exam is benign, CBC/CMP are unremarkable.  We will follow-up the pending CA 19-9 from today -surveillance CT CAP from 09/02/2022 which shows no evidence of recurrent or progressive disease.  The mildly enlarged retroperitoneal lymph nodes are stable. Repeat in 1 year -She remains in clinical remission.  She is 2 years from completing all therapy.  Continue surveillance and healthy active lifestyle.  She prefers to keep her port in for now, will continue to flush q2 months -I suggest to f/up with Dr. Donell Beers in 3 months if she does not already have  an appointment scheduled -Lab in 4 and 6 months, with follow-up with [place] in 6 months -Signs and symptoms of recurrence were reviewed, she will notify us sooner if needed    Plan: Labs reviewed  -CBC showing WBC 5.5; Hgb 12.9; Hct 38.6; Plt 147; Anc 2.7 -CMP - K 4.0; glucose 136; BUN 7; Creatinine 0.59; eGFR >60; Ca 9.4; LFTs normal.   -Ca 19.9 is pending Clinically, she is doing well, without new symptoms or concerns.  Due for surveillance CT CAP 08/2023.  Contacted Ronda Fairly, LCSW who will contact the patient 03/17/2023 and discuss resources available to the patient and her husband regarding medical coverage, especially for cancer care in the future.  Plans to keep port-a-cath until after next surveillance CT.  Continue port flush every 2 months  Labs with flush in 4 months. Labs/flush and follow up in 6 months.   The patient understands the plans discussed today and is in agreement with them.  She knows to contact our office if she develops concerns prior to her next appointment.  I provided 25 minutes of face-to-face time during this encounter and > 50% was spent counseling as documented under my assessment and plan.    Breanna Jews, NP  [facility] - A DEPT OF [facility] [address] [place] [address] Dept: [phone] Dept Fax: [phone]   No orders of the defined types were placed in this encounter.     CHIEF COMPLAINT:  CC: f/u pancreatic cancer   Current Treatment:  surveillance   INTERVAL HISTORY:  Breanna Noble is here today for repeat clinical assessment. She was last seen by Clayborn Heron, NP  on 09/08/2022. She presents with her husband today. She is doing well in general. They report that they will lose their medical insurance soon, as husband has been laid off by his job. She is concerned about how cancer will be followed after this. She denies chest pain, chest pressure, or shortness of breath. She  denies headaches or visual disturbances. She denies abdominal pain, nausea, vomiting, or changes in bowel or bladder habits.  She denies fevers or chills. She denies pain. Her appetite is good. Her weight has been stable.  I have reviewed the past medical history, past surgical history, social history and family history with the patient and they are unchanged from previous note.  ALLERGIES:  has No Known Allergies.  MEDICATIONS:  Current Outpatient Medications  Medication Sig Dispense Refill   Ascorbic Acid (VITAMIN C PO) Take 1,000 mg by mouth daily.      atorvastatin (LIPITOR) 10 MG tablet Take 1 tablet (10 mg total) by mouth daily. 90 tablet 3   Calcium Carb-Cholecalciferol (CALCIUM 1000 + D PO) Take 1 tablet by mouth daily.      ferrous sulfate 325 (65 FE) MG tablet Take 325 mg by mouth daily with breakfast.     ibuprofen (ADVIL) 200 MG tablet Take 400 mg by mouth every 6 (six) hours as needed for moderate pain.     levothyroxine (SYNTHROID, LEVOTHROID) 50 MCG tablet Take 1 tablet (50 mcg total) by mouth daily. 90 tablet 4   MAGNESIUM CITRATE PO Take by mouth.     Multiple Vitamin (MULTIVITAMIN PO) Take 1 tablet by mouth daily.     vitamin B-12 (CYANOCOBALAMIN) 1000 MCG tablet Take 1,000 mcg by mouth daily.     No current facility-administered medications for this visit.    HISTORY OF PRESENT ILLNESS:   Oncology History Overview Note  Cancer Staging Pancreatic cancer [facility]) Staging form: Exocrine Pancreas, AJCC 8th Edition - Clinical stage from 10/16/2019: Stage IIB (cT2, cN1, cM0) - Signed by Malachy Mood, MD on 10/16/2019    Pancreatic cancer (HCC)  10/03/2019 Imaging   US Abdomen 10/03/19  IMPRESSION: Markedly dilated bile ducts. Dilated gallbladder with sludge but no gallstones.   2.9 cm mass in the uncinate process most likely pancreatic neoplasm causing biliary obstruction. Recommend MRI of the pancreas without with contrast for further evaluation. If the patient cannot  have MRI, CT of the pancreas with contrast recommended.   10/07/2019 Imaging   MRI Abdomen 10/07/19  IMPRESSION: 1. There is a hypoenhancing mass of the pancreatic uncinate with abrupt obstruction of the central common bile duct, measuring approximately 2.9 cm, consistent with pancreatic adenocarcinoma. 2. Gross intra and extrahepatic biliary ductal dilatation, common bile duct measuring 1.4 cm. Gallbladder hydrops. 3. The pancreatic duct is nondilated. 4. Pancreatic mass lies closely adjacent to the most central portions of the superior mesenteric vein and central portal vein, due to motion artifact it is difficult to clearly discern whether there is a preserved fat plane. Multiphasic contrast enhanced pancreatic protocol CT may be less motion sensitive and better delineate vascular structures for the purposes of surgical planning if necessary. 5. No evidence of lymphadenopathy or metastatic disease in the abdomen.   10/10/2019 Procedure   EUS by Dr Dulce Sellar 10/10/19  IMPRESSION - A mass was identified at junction of head/uncinate process of the pancreas. This was staged T2 N1 Mx by endosonographic criteria. Fine needle aspiration performed. - A few abnormal lymph nodes were visualized in the peripancreatic region. - There was dilation in  the common bile duct which measured up to 14 mm. - Hyperechoic material consistent with sludge was visualized endosonographically in the gallbladder.   10/10/2019 Procedure   ERCP by Dr Dulce Sellar 10/10/19  IMPRESSION - The major papilla appeared normal. - One temporary stent was placed into the ventral pancreatic duct. - A biliary sphincterotomy was performed. - One covered metal stent was placed into the common bile duct.   10/10/2019 Initial Biopsy   FINAL MICROSCOPIC DIAGNOSIS: 10/10/19  A. PANCREAS, HEAD, FINE NEEDLE ASPIRATION:  - Malignant cells present, consistent with adenocarcinoma    10/16/2019 Initial Diagnosis   Pancreatic cancer (HCC)    10/16/2019 Cancer Staging   Staging form: Exocrine Pancreas, AJCC 8th Edition - Clinical stage from 10/16/2019: Stage IIB (cT2, cN1, cM0) - Signed by Malachy Mood, MD on 10/16/2019   10/23/2019 Imaging   CT CAP W contrast  IMPRESSION: 1. Pancreatic mass with local adenopathy and potential retroperitoneal adenopathy. Given retroperitoneal lymph nodes and presence of venous collaterals PET scan may be useful for staging purposes. 2. No definite vascular involvement or signs of upper abdominal venous collaterals. Some stranding around the celiac may be due to recent biopsy. 3. Borderline enlarged RIGHT external iliac lymph node. Attention on follow-up. 4. No evidence of metastatic disease in the chest. 5. Signs of recent ERCP and stent placement.   10/25/2019 Procedure   PAC placed    11/09/2019 - 02/20/2020 Chemotherapy   Neoadjuvant FOLFIRINOX q2weeks starting 11/09/19-02/20/20 for 8 cycles   11/28/2019 Genetic Testing   Negative genetic testing on the common hereditary cancer panel, pancreatic cancer panel, preliminary evidence pancreatic cancer panel and chronic pancreatitis panel.  The Common Hereditary Gene Panel offered by [facility] includes sequencing and/or deletion duplication testing of the following 55 genes: APC*, ATM*, AXIN2, BARD1, BMPR1A, BRCA1, BRCA2, BRIP1, CASR, CDH1, CDK4, CDKN2A (p14ARF), CDKN2A (p16INK4a), CFTR*, CHEK2, CPA1, CTNNA1, CTRC, DICER1*, EPCAM*, FANCC, GREM1*, HOXB13, KIT, MEN1*, MLH1*, MSH2*, MSH3*, MSH6*, MUTYH, NBN, NF1*, NTHL1, PALB2, PALLD, PDGFRA, PMS2*, POLD1*, POLE, PRSS1*, PTEN*, RAD50, RAD51C, RAD51D, SDHA*, SDHB, SDHC*, SDHD, SMAD4, SMARCA4, SPINK1, STK11, TP53, TSC1*, TSC2, and VHL .  The report date is November 28, 2019.   01/28/2020 Imaging   CT AP w contrast  IMPRESSION: 1. No substantial interval change in exam. 2. Similar appearance of the pancreatic mass with associated main pancreatic ductal dilatation. 3. Soft tissue stranding again noted around the  celiac axis, in the para-aortic retroperitoneal space and along the IMA (new in the interval). These findings are associated with similar appearance of upper normal to borderline hepatoduodenal ligament and retroperitoneal lymphadenopathy. Close continued attention on follow-up recommended. 4. Borderline enlarged right external iliac node identified on previous study has decreased in size in the interval.   02/14/2020 PET scan   IMPRESSION: 1. The pancreatic mass has a maximum SUV of 6.1. No findings of hypermetabolic local adenopathy or distant metastatic spread. 2. Splenomegaly. 3. Inspissated barium or appendicolith within the appendix. No signs of appendiceal inflammation. 4. Scattered sigmoid colon diverticula. ADDENDUM: The original report was by Dr. Gaylyn Rong. The following addendum is by Dr. Gaylyn Rong:   On the CT from 01/28/2020 there were some upper normal sized/borderline retroperitoneal lymph nodes including a 0.8 cm aortocaval node on image 72 of series 9. This currently has a maximum SUV of 2.2, which is similar to blood pool activity. An indistinctly marginated lymph node just below the left renal vein has a maximum SUV of 2.4, just above blood pool levels., and measures  about 1.0 cm in diameter. Lymph nodes anterior to the left psoas muscle have maximum SUV of 2.0, similar to blood pool. Overall these lymph nodes are borderline enlarged and with activity level at or just minimally above blood pool. While certainly not grossly positive, their proximity to the pancreatic head mass and borderline imaging characteristics make it difficult to exclude early nodal involvement by malignancy.   03/24/2020 Surgery   Whipple surgery, Laparoscopic Diagnostic, Appendectomy by Dr Donell Beers    03/24/2020 Pathology Results   FINAL MICROSCOPIC DIAGNOSIS:   A. WHIPPLE PROCEDURE, WITH GALLBLADDER:  - Adenocarcinoma, moderately differentiated, spanning 1 cm.  -  Tumor involves retroperitoneal peripancreatic soft tissue.  - Resection margins are negative.  - Treatment effect present.  - Rare tumor cells in one of fourteen lymph nodes (1/14).  - Benign gallbladder with chronic inflammation and benign lymph node.  - Bile duct with granulation tissue and reactive changes.  - Benign appendix.  - Mild reactive gastropathy.  - See oncology table.   B. LYMPH NODE, PORTAL, EXCISION:  - Three of three lymph nodes negative for carcinoma (0/3).   C. LYMPH NODE, COMMON HEPATIC ARTERY, EXCISION:  - Three of three lymph nodes negative for carcinoma (0/3).   D. APPENDIX, APPENDECTOMY:  - Benign appendix.   E. LYMPH NODE, AORTOCAVAL, EXCISION:  - One of one lymph node negative for carcinoma (0/1).    03/24/2020 Cancer Staging   Staging form: Exocrine Pancreas, AJCC 8th Edition - Pathologic stage from 03/24/2020: Stage IIB (pT1c, pN1, cM0) - Signed by Malachy Mood, MD on 04/16/2020   04/14/2020 Imaging   CT AP  IMPRESSION: 1. Interval Whipple procedure. Pancreatic stent in place. 2. Ill-defined soft tissue in the right abdominal wall extending from the subcutaneous tissues to the anterior abdominal wall musculature, but no focal fluid collection. This may represent postsurgical change, sterility indeterminate by imaging. 3. Pelvic anatomy is poorly defined, including pelvic bowel loops, adnexa, and colon. There appears to be sigmoid colonic wall thickening as well as intraluminal fluid, with additional diffuse colonic wall thickening from the transverse colon distally. Findings are suggestive of generalized colitis. Attention to this area on follow-up is recommended. 4. Small amount of non organized free fluid in the right upper quadrant. No evidence of intra-abdominal abscess. 5. Periportal edema, nonspecific. Previous biliary stent has been removed. Common bile duct is not well-defined on the current exam. 6. Mild splenomegaly. 7. Trace right  pleural effusion.   05/19/2020 - 08/04/2020 Chemotherapy   Adjuvant Gemcitabine 2 weeks on 1 week off starting 05/19/20 and Xeloda started on 05/25/2020. Xeloda dose currently at 1500mg  in the AM and 1000mg  in the PM. Completed 08/04/20.    08/18/2020 Imaging   CT CAP  IMPRESSION: 1. Interval improvement in previously demonstrated ascites and generalized mesenteric edema. No peritoneal nodularity or focal extraluminal fluid collection identified. 2. Stable postsurgical changes from previous Whipple procedure. No evidence of local recurrence or metastatic disease. 3. Pancreatic stent remains in place without pancreatic ductal dilatation. Although there is no pneumobilia, there is no significant biliary dilatation. 4. Stable mild splenomegaly. 5. No significant findings in the chest.   12/08/2020 Imaging   CT CAP  IMPRESSION: 1. Status post Whipple pancreaticoduodenectomy. 2. There are numerous newly enlarged, matted appearing retroperitoneal lymph nodes, largest left retroperitoneal nodes measuring up to 1.9 x 1.2 cm. Findings are highly concerning for nodal metastatic disease. 3. Main pancreatic duct stent remains within the pancreatic duct but advanced several centimeters into the jejunal  efferent limb when compared to prior examination. There is new, diffuse pancreatic ductal dilatation, measuring up to 6 mm. 4. No evidence of organ metastatic disease or distant metastatic disease in the chest. 5. Small volume perihepatic ascites, unchanged, suspicious for malignant ascites, although without direct evidence of omental or peritoneal metastatic disease. 6. Coronary artery disease.   12/22/2020 PET scan   IMPRESSION: No evidence of recurrent or metastatic carcinoma.       REVIEW OF SYSTEMS:   Constitutional: Denies fevers, chills or abnormal weight loss Eyes: Denies blurriness of vision Ears, nose, mouth, throat, and face: Denies mucositis or sore throat Respiratory: Denies cough,  dyspnea or wheezes Cardiovascular: Denies palpitation, chest discomfort or lower extremity swelling Gastrointestinal:  Denies nausea, heartburn or change in bowel habits Skin: Denies abnormal skin rashes Lymphatics: Denies new lymphadenopathy or easy bruising Neurological:Denies numbness, tingling or new weaknesses Behavioral/Psych: Mood is stable, no new changes  All other systems were reviewed with the patient and are negative.   VITALS:   Today's Vitals   03/16/23 1128  BP: 113/70  Pulse: 62  Resp: 17  Temp: 97.6 F (36.4 C)  TempSrc: Temporal  SpO2: 100%  Weight: 127 lb 9.6 oz (57.9 kg)   Body mass index is 21.23 kg/m.   Wt Readings from Last 3 Encounters:  03/16/23 127 lb 9.6 oz (57.9 kg)  12/08/22 128 lb (58.1 kg)  09/22/22 129 lb 3.2 oz (58.6 kg)    Body mass index is 21.23 kg/m.  Performance status (ECOG): 0 - Asymptomatic  PHYSICAL EXAM:   GENERAL:alert, no distress and comfortable SKIN: skin color, texture, turgor are normal, no rashes or significant lesions EYES: normal, Conjunctiva are pink and non-injected, sclera clear OROPHARYNX:no exudate, no erythema and lips, buccal mucosa, and tongue normal  NECK: supple, thyroid normal size, non-tender, without nodularity LYMPH:  no palpable lymphadenopathy in the cervical, axillary or inguinal LUNGS: clear to auscultation and percussion with normal breathing effort HEART: regular rate & rhythm and no murmurs and no lower extremity edema ABDOMEN:abdomen soft, non-tender and normal bowel sounds Musculoskeletal:no cyanosis of digits and no clubbing  NEURO: alert & oriented x 3 with fluent speech, no focal motor/sensory deficits  LABORATORY DATA:  I have reviewed the data as listed    Component Value Date/Time   NA 140 03/16/2023 1029   NA 142 06/21/2022 1607   K 4.0 03/16/2023 1029   CL 104 03/16/2023 1029   CO2 31 03/16/2023 1029   GLUCOSE 136 (H) 03/16/2023 1029   BUN 7 (L) 03/16/2023 1029   BUN 7 (L)  06/21/2022 1607   CREATININE 0.59 03/16/2023 1029   CALCIUM 9.4 03/16/2023 1029   PROT 6.8 03/16/2023 1029   PROT 6.9 12/10/2012 1029   ALBUMIN 4.2 03/16/2023 1029   ALBUMIN 4.5 12/10/2012 1029   AST 24 03/16/2023 1029   ALT 19 03/16/2023 1029   ALKPHOS 74 03/16/2023 1029   BILITOT 0.6 03/16/2023 1029   GFRNONAA >60 03/16/2023 1029   GFRAA >60 02/03/2020 1105   Lab Results  Component Value Date   WBC 5.5 03/16/2023   NEUTROABS 2.7 03/16/2023   HGB 12.9 03/16/2023   HCT 38.6 03/16/2023   MCV 90.6 03/16/2023   PLT 147 (L) 03/16/2023

## 2023-03-16 NOTE — Assessment & Plan Note (Signed)
Stage IIB, cT2N1M0, ypT1N1 -Diagnosed 10/2019 by EUS with ERCP by Dr. Dulce Sellar.  Pancreatic mass biopsy showed adenocarcinoma.  Endoscopic staging T2 N1 without vascular involvement. -Baseline CA 19-9 elevated at 883. -S/p 8 cycles of neoadjuvant FOLFIRINOX from 11/09/2019 - 02/20/2020, tolerated moderately well  -S/p Whipple surgery with Dr. Donell Beers on 03/24/2020, path showed a residual 1 cm moderately differentiated adenocarcinoma involving RP peripancreatic soft tissue and rare tumor cells in 1/14 positive lymph nodes, margins negative -She completed 3 months adjuvant chemo with gemcitabine/Xeloda 05/19/20 - 08/04/20 then went on surveillance  -CA 19-9 mildly elevated in 40-50s range since 09/2020 -A CT CAP on 12/08/2020 showed new retroperitoneal lymph nodes that were negative on PET.  These have been stable/improving in the interim -Ms. Breanna Noble is clinically doing well.  Exam is benign, CBC/CMP are unremarkable.  We will follow-up the pending CA 19-9 from today -surveillance CT CAP from 09/02/2022 which shows no evidence of recurrent or progressive disease.  The mildly enlarged retroperitoneal lymph nodes are stable. Repeat in 1 year -She remains in clinical remission.  She is 2 years from completing all therapy.  Continue surveillance and healthy active lifestyle.  She prefers to keep her port in for now, will continue to flush q2 months -I suggest to f/up with Dr. Donell Beers in 3 months if she does not already have an appointment scheduled -Lab in 4 and 6 months, with follow-up with [place] in 6 months -Signs and symptoms of recurrence were reviewed, she will notify us sooner if needed

## 2023-03-17 ENCOUNTER — Encounter: Payer: Self-pay | Admitting: Hematology

## 2023-03-17 LAB — CANCER ANTIGEN 19-9: CA 19-9: 39 U/mL — ABNORMAL HIGH (ref 0–35)

## 2023-03-22 ENCOUNTER — Encounter: Payer: Self-pay | Admitting: Hematology

## 2023-03-22 NOTE — Telephone Encounter (Signed)
Telephone call  

## 2023-03-29 ENCOUNTER — Inpatient Hospital Stay: Payer: BC Managed Care – PPO | Admitting: Licensed Clinical Social Worker

## 2023-03-29 DIAGNOSIS — C250 Malignant neoplasm of head of pancreas: Secondary | ICD-10-CM

## 2023-03-29 NOTE — Progress Notes (Signed)
[facility] CSW Progress Note  Clinical Social Worker contacted patient by phone to discuss concerns regarding insurance coverage.  Per pt her husband is currently working as a Curator at a [employer] and has been informed he will be laid off some time within the next 3 or 4 months.  Pt's spouse is 18 and is receiving social security and Medicare, but was carrying the insurance for pt as she is not yet retirement age.  CSW advised pt to explore Medicaid once her husband is no longer working as she may qualify if there is a significant reduction in the household income.  If pt does not qualify for Medicaid she was given the contact number for [employer] to [employer] options.  Contact information provided for CSW as well.  CSW to remain available to provide support as appropriate.      Rachel Moulds, LCSW Clinical Social Worker [facility]

## 2023-05-02 DIAGNOSIS — Z8507 Personal history of malignant neoplasm of pancreas: Secondary | ICD-10-CM | POA: Diagnosis not present

## 2023-05-02 DIAGNOSIS — E039 Hypothyroidism, unspecified: Secondary | ICD-10-CM | POA: Diagnosis not present

## 2023-05-17 ENCOUNTER — Other Ambulatory Visit: Payer: Self-pay | Admitting: Cardiology

## 2023-05-31 ENCOUNTER — Other Ambulatory Visit: Payer: Self-pay | Admitting: Hematology

## 2023-05-31 DIAGNOSIS — Z800 Family history of malignant neoplasm of digestive organs: Secondary | ICD-10-CM | POA: Diagnosis not present

## 2023-05-31 DIAGNOSIS — C250 Malignant neoplasm of head of pancreas: Secondary | ICD-10-CM | POA: Diagnosis not present

## 2023-05-31 DIAGNOSIS — Z95828 Presence of other vascular implants and grafts: Secondary | ICD-10-CM | POA: Diagnosis not present

## 2023-06-15 ENCOUNTER — Other Ambulatory Visit: Payer: Self-pay

## 2023-06-19 ENCOUNTER — Inpatient Hospital Stay: Payer: BC Managed Care – PPO | Attending: Hematology

## 2023-06-19 DIAGNOSIS — Z95828 Presence of other vascular implants and grafts: Secondary | ICD-10-CM

## 2023-06-19 DIAGNOSIS — C250 Malignant neoplasm of head of pancreas: Secondary | ICD-10-CM | POA: Diagnosis not present

## 2023-06-19 DIAGNOSIS — Z452 Encounter for adjustment and management of vascular access device: Secondary | ICD-10-CM | POA: Diagnosis not present

## 2023-06-19 LAB — CBC WITH DIFFERENTIAL (CANCER CENTER ONLY)
Abs Immature Granulocytes: 0.01 10*3/uL (ref 0.00–0.07)
Basophils Absolute: 0 10*3/uL (ref 0.0–0.1)
Basophils Relative: 1 %
Eosinophils Absolute: 0.1 10*3/uL (ref 0.0–0.5)
Eosinophils Relative: 1 %
HCT: 36.6 % (ref 36.0–46.0)
Hemoglobin: 11.9 g/dL — ABNORMAL LOW (ref 12.0–15.0)
Immature Granulocytes: 0 %
Lymphocytes Relative: 40 %
Lymphs Abs: 2.7 10*3/uL (ref 0.7–4.0)
MCH: 29.5 pg (ref 26.0–34.0)
MCHC: 32.5 g/dL (ref 30.0–36.0)
MCV: 90.8 fL (ref 80.0–100.0)
Monocytes Absolute: 0.7 10*3/uL (ref 0.1–1.0)
Monocytes Relative: 10 %
Neutro Abs: 3.2 10*3/uL (ref 1.7–7.7)
Neutrophils Relative %: 48 %
Platelet Count: 163 10*3/uL (ref 150–400)
RBC: 4.03 MIL/uL (ref 3.87–5.11)
RDW: 13.5 % (ref 11.5–15.5)
WBC Count: 6.6 10*3/uL (ref 4.0–10.5)
nRBC: 0 % (ref 0.0–0.2)

## 2023-06-19 LAB — CMP (CANCER CENTER ONLY)
ALT: 20 U/L (ref 0–44)
AST: 26 U/L (ref 15–41)
Albumin: 4.3 g/dL (ref 3.5–5.0)
Alkaline Phosphatase: 71 U/L (ref 38–126)
Anion gap: 4 — ABNORMAL LOW (ref 5–15)
BUN: 9 mg/dL (ref 8–23)
CO2: 32 mmol/L (ref 22–32)
Calcium: 9.2 mg/dL (ref 8.9–10.3)
Chloride: 104 mmol/L (ref 98–111)
Creatinine: 0.56 mg/dL (ref 0.44–1.00)
GFR, Estimated: >60 mL/min (ref >60)
Glucose, Bld: 106 mg/dL — ABNORMAL HIGH (ref 70–99)
Potassium: 4 mmol/L (ref 3.5–5.1)
Sodium: 140 mmol/L (ref 135–145)
Total Bilirubin: 0.4 mg/dL (ref 0.0–1.2)
Total Protein: 6.4 g/dL — ABNORMAL LOW (ref 6.5–8.1)

## 2023-06-19 MED ORDER — HEPARIN SOD (PORK) LOCK FLUSH 100 UNIT/ML IV SOLN
500.0000 [IU] | Freq: Once | INTRAVENOUS | Status: AC
  Administered 2023-06-19: 500 [IU]

## 2023-06-19 MED ORDER — SODIUM CHLORIDE 0.9% FLUSH
10.0000 mL | Freq: Once | INTRAVENOUS | Status: AC
  Administered 2023-06-19: 10 mL

## 2023-06-20 LAB — CANCER ANTIGEN 19-9: CA 19-9: 37 U/mL — ABNORMAL HIGH (ref 0–35)

## 2023-07-25 ENCOUNTER — Other Ambulatory Visit: Payer: Self-pay | Admitting: Cardiology

## 2023-08-07 ENCOUNTER — Inpatient Hospital Stay: Payer: BC Managed Care – PPO | Attending: Hematology

## 2023-08-07 DIAGNOSIS — C250 Malignant neoplasm of head of pancreas: Secondary | ICD-10-CM | POA: Diagnosis not present

## 2023-08-07 DIAGNOSIS — Z452 Encounter for adjustment and management of vascular access device: Secondary | ICD-10-CM | POA: Insufficient documentation

## 2023-08-07 DIAGNOSIS — Z95828 Presence of other vascular implants and grafts: Secondary | ICD-10-CM

## 2023-08-07 MED ORDER — SODIUM CHLORIDE 0.9% FLUSH
10.0000 mL | Freq: Once | INTRAVENOUS | Status: AC
  Administered 2023-08-07: 10 mL

## 2023-08-07 MED ORDER — HEPARIN SOD (PORK) LOCK FLUSH 100 UNIT/ML IV SOLN
250.0000 [IU] | Freq: Once | INTRAVENOUS | Status: AC
  Administered 2023-08-07: 250 [IU]

## 2023-08-15 ENCOUNTER — Encounter: Payer: Self-pay | Admitting: Hematology

## 2023-09-18 ENCOUNTER — Encounter (HOSPITAL_COMMUNITY): Payer: Self-pay

## 2023-09-18 ENCOUNTER — Ambulatory Visit (HOSPITAL_COMMUNITY)
Admission: RE | Admit: 2023-09-18 | Discharge: 2023-09-18 | Disposition: A | Discharge status: Home / Self Care | Source: Ambulatory Visit | Attending: Nurse Practitioner | Admitting: Nurse Practitioner

## 2023-09-18 ENCOUNTER — Inpatient Hospital Stay: Payer: BC Managed Care – PPO | Attending: Hematology

## 2023-09-18 DIAGNOSIS — D649 Anemia, unspecified: Secondary | ICD-10-CM | POA: Diagnosis not present

## 2023-09-18 DIAGNOSIS — C259 Malignant neoplasm of pancreas, unspecified: Secondary | ICD-10-CM | POA: Diagnosis not present

## 2023-09-18 DIAGNOSIS — Z9049 Acquired absence of other specified parts of digestive tract: Secondary | ICD-10-CM | POA: Diagnosis not present

## 2023-09-18 DIAGNOSIS — Z79899 Other long term (current) drug therapy: Secondary | ICD-10-CM | POA: Insufficient documentation

## 2023-09-18 DIAGNOSIS — C250 Malignant neoplasm of head of pancreas: Secondary | ICD-10-CM | POA: Diagnosis not present

## 2023-09-18 DIAGNOSIS — Z95828 Presence of other vascular implants and grafts: Secondary | ICD-10-CM

## 2023-09-18 LAB — CMP (CANCER CENTER ONLY)
ALT: 25 U/L (ref 0–44)
AST: 29 U/L (ref 15–41)
Albumin: 4.3 g/dL (ref 3.5–5.0)
Alkaline Phosphatase: 68 U/L (ref 38–126)
Anion gap: 4 — ABNORMAL LOW (ref 5–15)
BUN: 7 mg/dL — ABNORMAL LOW (ref 8–23)
CO2: 30 mmol/L (ref 22–32)
Calcium: 8.9 mg/dL (ref 8.9–10.3)
Chloride: 104 mmol/L (ref 98–111)
Creatinine: 0.54 mg/dL (ref 0.44–1.00)
GFR, Estimated: >60 mL/min (ref >60)
Glucose, Bld: 103 mg/dL — ABNORMAL HIGH (ref 70–99)
Potassium: 4 mmol/L (ref 3.5–5.1)
Sodium: 138 mmol/L (ref 135–145)
Total Bilirubin: 0.5 mg/dL (ref 0.0–1.2)
Total Protein: 6.6 g/dL (ref 6.5–8.1)

## 2023-09-18 LAB — CBC WITH DIFFERENTIAL (CANCER CENTER ONLY)
Abs Immature Granulocytes: 0.01 10*3/uL (ref 0.00–0.07)
Basophils Absolute: 0 10*3/uL (ref 0.0–0.1)
Basophils Relative: 1 %
Eosinophils Absolute: 0.1 10*3/uL (ref 0.0–0.5)
Eosinophils Relative: 1 %
HCT: 36.8 % (ref 36.0–46.0)
Hemoglobin: 12.3 g/dL (ref 12.0–15.0)
Immature Granulocytes: 0 %
Lymphocytes Relative: 51 %
Lymphs Abs: 2.8 10*3/uL (ref 0.7–4.0)
MCH: 30.1 pg (ref 26.0–34.0)
MCHC: 33.4 g/dL (ref 30.0–36.0)
MCV: 90.2 fL (ref 80.0–100.0)
Monocytes Absolute: 0.5 10*3/uL (ref 0.1–1.0)
Monocytes Relative: 8 %
Neutro Abs: 2.2 10*3/uL (ref 1.7–7.7)
Neutrophils Relative %: 39 %
Platelet Count: 166 10*3/uL (ref 150–400)
RBC: 4.08 MIL/uL (ref 3.87–5.11)
RDW: 13.2 % (ref 11.5–15.5)
WBC Count: 5.5 10*3/uL (ref 4.0–10.5)
nRBC: 0 % (ref 0.0–0.2)

## 2023-09-18 MED ORDER — IOHEXOL 300 MG/ML  SOLN
100.0000 mL | Freq: Once | INTRAMUSCULAR | Status: AC | PRN
  Administered 2023-09-18: 100 mL via INTRAVENOUS

## 2023-09-18 MED ORDER — HEPARIN SOD (PORK) LOCK FLUSH 100 UNIT/ML IV SOLN
500.0000 [IU] | Freq: Once | INTRAVENOUS | Status: AC
  Administered 2023-09-18: 500 [IU] via INTRAVENOUS

## 2023-09-18 MED ORDER — SODIUM CHLORIDE (PF) 0.9 % IJ SOLN
INTRAMUSCULAR | Status: AC
  Filled 2023-09-18: qty 50

## 2023-09-18 MED ORDER — SODIUM CHLORIDE 0.9% FLUSH
10.0000 mL | Freq: Once | INTRAVENOUS | Status: AC
Start: 2023-09-18 — End: 2023-09-18
  Administered 2023-09-18: 10 mL

## 2023-09-18 MED ORDER — HEPARIN SOD (PORK) LOCK FLUSH 100 UNIT/ML IV SOLN
INTRAVENOUS | Status: AC
  Filled 2023-09-18: qty 5

## 2023-09-19 LAB — CANCER ANTIGEN 19-9: CA 19-9: 41 U/mL — ABNORMAL HIGH (ref 0–35)

## 2023-09-25 NOTE — Assessment & Plan Note (Signed)
 Stage IIB, cT2N1M0, ypT1N1 -diagnosed in 10/10/19 by EUS (with ERCP) by Dr Kimble Pennant. Pancreatic mass biopsy showed adenocarcinoma. Endoscopic staging T2N1, no evidence of vascular involvement. -baseline CA 19-9 on 10/16/19 was elevated at 883. -s/p 8 cycles of neoadjuvant FOLFIRINOX 11/09/19 - 02/20/20, tolerated moderately well -s/p Whipple surgery with Dr Cherlynn Cornfield on 03/24/20. Path showed 1 cm residual moderately differentiated adenocarcinoma, and 1/14 positive lymph nodes. Surgical margins were negative.  -s/p 4 cycles of adjuvant Gemcitabine /Xeloda  05/19/20 - 08/04/20. She is currently on surveillance.  -restaging CT AP on 09/02/2022 showed NED.

## 2023-09-26 ENCOUNTER — Inpatient Hospital Stay: Payer: BC Managed Care – PPO | Admitting: Hematology

## 2023-09-26 VITALS — BP 111/64 | HR 64 | Temp 98.0°F | Resp 17 | Wt 127.9 lb

## 2023-09-26 DIAGNOSIS — C250 Malignant neoplasm of head of pancreas: Secondary | ICD-10-CM | POA: Diagnosis not present

## 2023-09-26 DIAGNOSIS — D649 Anemia, unspecified: Secondary | ICD-10-CM | POA: Diagnosis not present

## 2023-09-26 DIAGNOSIS — Z79899 Other long term (current) drug therapy: Secondary | ICD-10-CM | POA: Diagnosis not present

## 2023-09-26 NOTE — Progress Notes (Signed)
 [facility]   Telephone:([phone] [phone]) [phone]   Clinic Follow up Note   Patient Care Team: Mordechai April, DO as PCP - General (Family Medicine) Wendie Hamburg, MD as PCP - Cardiology (Cardiology) Sonja [place], MD as Attending Physician (Hematology and Oncology) Evangeline Hilts, MD as Consulting Physician (Gastroenterology) Lockie Rima, MD as Consulting Physician (General Surgery) Elana Grayer, RD as Dietitian (Nutrition)  Date of Service:  09/26/2023  CHIEF COMPLAINT: f/u of pancreatic cancer  CURRENT THERAPY:  Cancer surveillance  Oncology History   Pancreatic cancer [facility]) Stage IIB, cT2N1M0, ypT1N1 -diagnosed in 10/10/19 by EUS (with ERCP) by Dr Kimble Pennant. Pancreatic mass biopsy showed adenocarcinoma. Endoscopic staging T2N1, no evidence of vascular involvement. -baseline CA 19-9 on 10/16/19 was elevated at 883. -s/p 8 cycles of neoadjuvant FOLFIRINOX 11/09/19 - 02/20/20, tolerated moderately well -s/p Whipple surgery with Dr Cherlynn Cornfield on 03/24/20. Path showed 1 cm residual moderately differentiated adenocarcinoma, and 1/14 positive lymph nodes. Surgical margins were negative.  -s/p 4 cycles of adjuvant Gemcitabine /Xeloda  05/19/20 - 08/04/20. She is currently on surveillance.  -restaging CT AP on 09/02/2022 showed NED.  Assessment & Plan Pancreatic cancer Four years post-diagnosis with well-managed condition. Recent CT scan shows stable lymph nodes in the retroperitoneal area, unrelated to malignancy. No symptoms such as abdominal pain, weight loss, or altered bowel habits. Tumor markers slightly elevated but consistent with previous levels, not indicative of recurrence. Low risk of recurrence after four years. Follow-up visits will cease after five years if no recurrence occurs. - Schedule follow-up visit in one year, which will be the last visit if no recurrence. - Schedule port removal before June 28 due to insurance coverage. - Notify Dr. Cherlynn Cornfield  regarding port removal and insurance issue.  Anemia Previously noted anemia with slightly low hemoglobin three months ago. Current hemoglobin is normal. Iron levels not recently assessed, but prior ferritin levels were elevated in June 2021. No indication for continued iron supplementation. - Discontinue iron supplementation.  Plan - I personally reviewed her surveillance CT scan from 12/19/2023, which showed no evidence of recurrence - I sent a message to her surgeon Dr. Cherlynn Cornfield for her port removal in the next month - I suggest her to see Dr. Cherlynn Cornfield in 5 to 6 months for follow-up - Open follow-up with me or APP in a year for her last visit    SUMMARY OF ONCOLOGIC HISTORY: Oncology History Overview Note  Cancer Staging Pancreatic cancer [facility]) Staging form: Exocrine Pancreas, AJCC 8th Edition - Clinical stage from 10/16/2019: Stage IIB (cT2, cN1, cM0) - Signed by Sonja [place], MD on 10/16/2019    Pancreatic cancer (HCC)  10/03/2019 Imaging   US  Abdomen 10/03/19  IMPRESSION: Markedly dilated bile ducts. Dilated gallbladder with sludge but no gallstones.   2.9 cm mass in the uncinate process most likely pancreatic neoplasm causing biliary obstruction. Recommend MRI of the pancreas without with contrast for further evaluation. If the patient cannot have MRI, CT of the pancreas with contrast recommended.   10/07/2019 Imaging   MRI Abdomen 10/07/19  IMPRESSION: 1. There is a hypoenhancing mass of the pancreatic uncinate with abrupt obstruction of the central common bile duct, measuring approximately 2.9 cm, consistent with pancreatic adenocarcinoma. 2. Gross intra and extrahepatic biliary ductal dilatation, common bile duct measuring 1.4 cm. Gallbladder hydrops. 3. The pancreatic duct is nondilated. 4. Pancreatic mass lies closely adjacent to the most central portions of the superior mesenteric vein and central portal vein, due to motion artifact it is  difficult to clearly discern  whether there is a preserved fat plane. Multiphasic contrast enhanced pancreatic protocol CT may be less motion sensitive and better delineate vascular structures for the purposes of surgical planning if necessary. 5. No evidence of lymphadenopathy or metastatic disease in the abdomen.   10/10/2019 Procedure   EUS by Dr Kimble Pennant 10/10/19  IMPRESSION - A mass was identified at junction of head/uncinate process of the pancreas. This was staged T2 N1 Mx by endosonographic criteria. Fine needle aspiration performed. - A few abnormal lymph nodes were visualized in the peripancreatic region. - There was dilation in the common bile duct which measured up to 14 mm. - Hyperechoic material consistent with sludge was visualized endosonographically in the gallbladder.   10/10/2019 Procedure   ERCP by Dr Kimble Pennant 10/10/19  IMPRESSION - The major papilla appeared normal. - One temporary stent was placed into the ventral pancreatic duct. - A biliary sphincterotomy was performed. - One covered metal stent was placed into the common bile duct.   10/10/2019 Initial Biopsy   FINAL MICROSCOPIC DIAGNOSIS: 10/10/19  A. PANCREAS, HEAD, FINE NEEDLE ASPIRATION:  - Malignant cells present, consistent with adenocarcinoma    10/16/2019 Initial Diagnosis   Pancreatic cancer (HCC)   10/16/2019 Cancer Staging   Staging form: Exocrine Pancreas, AJCC 8th Edition - Clinical stage from 10/16/2019: Stage IIB (cT2, cN1, cM0) - Signed by Sonja [place], MD on 10/16/2019   10/23/2019 Imaging   CT CAP W contrast  IMPRESSION: 1. Pancreatic mass with local adenopathy and potential retroperitoneal adenopathy. Given retroperitoneal lymph nodes and presence of venous collaterals PET scan may be useful for staging purposes. 2. No definite vascular involvement or signs of upper abdominal venous collaterals. Some stranding around the celiac may be due to recent biopsy. 3. Borderline enlarged RIGHT external iliac lymph node. Attention  on follow-up. 4. No evidence of metastatic disease in the chest. 5. Signs of recent ERCP and stent placement.   10/25/2019 Procedure   PAC placed    11/09/2019 - 02/20/2020 Chemotherapy   Neoadjuvant FOLFIRINOX q2weeks starting 11/09/19-02/20/20 for 8 cycles   11/28/2019 Genetic Testing   Negative genetic testing on the common hereditary cancer panel, pancreatic cancer panel, preliminary evidence pancreatic cancer panel and chronic pancreatitis panel.  The Common Hereditary Gene Panel offered by [facility] includes sequencing and/or deletion duplication testing of the following 55 genes: APC*, ATM*, AXIN2, BARD1, BMPR1A, BRCA1, BRCA2, BRIP1, CASR, CDH1, CDK4, CDKN2A (p14ARF), CDKN2A (p16INK4a), CFTR*, CHEK2, CPA1, CTNNA1, CTRC, DICER1*, EPCAM*, FANCC, GREM1*, HOXB13, KIT, MEN1*, MLH1*, MSH2*, MSH3*, MSH6*, MUTYH, NBN, NF1*, NTHL1, PALB2, PALLD, PDGFRA, PMS2*, POLD1*, POLE, PRSS1*, PTEN*, RAD50, RAD51C, RAD51D, SDHA*, SDHB, SDHC*, SDHD, SMAD4, SMARCA4, SPINK1, STK11, TP53, TSC1*, TSC2, and VHL .  The report date is November 28, 2019.   01/28/2020 Imaging   CT AP w contrast  IMPRESSION: 1. No substantial interval change in exam. 2. Similar appearance of the pancreatic mass with associated main pancreatic ductal dilatation. 3. Soft tissue stranding again noted around the celiac axis, in the para-aortic retroperitoneal space and along the IMA (new in the interval). These findings are associated with similar appearance of upper normal to borderline hepatoduodenal ligament and retroperitoneal lymphadenopathy. Close continued attention on follow-up recommended. 4. Borderline enlarged right external iliac node identified on previous study has decreased in size in the interval.   02/14/2020 PET scan   IMPRESSION: 1. The pancreatic mass has a maximum SUV of 6.1. No findings of hypermetabolic local adenopathy or distant metastatic spread. 2. Splenomegaly.  3. Inspissated barium or appendicolith within the  appendix. No signs of appendiceal inflammation. 4. Scattered sigmoid colon diverticula. ADDENDUM: The original report was by Dr. Freida Jes. The following addendum is by Dr. Freida Jes:   On the CT from 01/28/2020 there were some upper normal sized/borderline retroperitoneal lymph nodes including a 0.8 cm aortocaval node on image 72 of series 9. This currently has a maximum SUV of 2.2, which is similar to blood pool activity. An indistinctly marginated lymph node just below the left renal vein has a maximum SUV of 2.4, just above blood pool levels., and measures about 1.0 cm in diameter. Lymph nodes anterior to the left psoas muscle have maximum SUV of 2.0, similar to blood pool. Overall these lymph nodes are borderline enlarged and with activity level at or just minimally above blood pool. While certainly not grossly positive, their proximity to the pancreatic head mass and borderline imaging characteristics make it difficult to exclude early nodal involvement by malignancy.   03/24/2020 Surgery   Whipple surgery, Laparoscopic Diagnostic, Appendectomy by Dr Cherlynn Cornfield    03/24/2020 Pathology Results   FINAL MICROSCOPIC DIAGNOSIS:   A. WHIPPLE PROCEDURE, WITH GALLBLADDER:  - Adenocarcinoma, moderately differentiated, spanning 1 cm.  - Tumor involves retroperitoneal peripancreatic soft tissue.  - Resection margins are negative.  - Treatment effect present.  - Rare tumor cells in one of fourteen lymph nodes (1/14).  - Benign gallbladder with chronic inflammation and benign lymph node.  - Bile duct with granulation tissue and reactive changes.  - Benign appendix.  - Mild reactive gastropathy.  - See oncology table.   B. LYMPH NODE, PORTAL, EXCISION:  - Three of three lymph nodes negative for carcinoma (0/3).   C. LYMPH NODE, COMMON HEPATIC ARTERY, EXCISION:  - Three of three lymph nodes negative for carcinoma (0/3).   D. APPENDIX, APPENDECTOMY:  - Benign appendix.    E. LYMPH NODE, AORTOCAVAL, EXCISION:  - One of one lymph node negative for carcinoma (0/1).    03/24/2020 Cancer Staging   Staging form: Exocrine Pancreas, AJCC 8th Edition - Pathologic stage from 03/24/2020: Stage IIB (pT1c, pN1, cM0) - Signed by Sonja [place], MD on 04/16/2020   04/14/2020 Imaging   CT AP  IMPRESSION: 1. Interval Whipple procedure. Pancreatic stent in place. 2. Ill-defined soft tissue in the right abdominal wall extending from the subcutaneous tissues to the anterior abdominal wall musculature, but no focal fluid collection. This may represent postsurgical change, sterility indeterminate by imaging. 3. Pelvic anatomy is poorly defined, including pelvic bowel loops, adnexa, and colon. There appears to be sigmoid colonic wall thickening as well as intraluminal fluid, with additional diffuse colonic wall thickening from the transverse colon distally. Findings are suggestive of generalized colitis. Attention to this area on follow-up is recommended. 4. Small amount of non organized free fluid in the right upper quadrant. No evidence of intra-abdominal abscess. 5. Periportal edema, nonspecific. Previous biliary stent has been removed. Common bile duct is not well-defined on the current exam. 6. Mild splenomegaly. 7. Trace right pleural effusion.   05/19/2020 - 08/04/2020 Chemotherapy   Adjuvant Gemcitabine  2 weeks on 1 week off starting 05/19/20 and Xeloda  started on 05/25/2020. Xeloda  dose currently at 1500mg  in the AM and 1000mg  in the PM. Completed 08/04/20.    08/18/2020 Imaging   CT CAP  IMPRESSION: 1. Interval improvement in previously demonstrated ascites and generalized mesenteric edema. No peritoneal nodularity or focal extraluminal fluid collection identified. 2. Stable postsurgical changes from previous Whipple procedure.  No evidence of local recurrence or metastatic disease. 3. Pancreatic stent remains in place without pancreatic ductal dilatation. Although  there is no pneumobilia, there is no significant biliary dilatation. 4. Stable mild splenomegaly. 5. No significant findings in the chest.   12/08/2020 Imaging   CT CAP  IMPRESSION: 1. Status post Whipple pancreaticoduodenectomy. 2. There are numerous newly enlarged, matted appearing retroperitoneal lymph nodes, largest left retroperitoneal nodes measuring up to 1.9 x 1.2 cm. Findings are highly concerning for nodal metastatic disease. 3. Main pancreatic duct stent remains within the pancreatic duct but advanced several centimeters into the jejunal efferent limb when compared to prior examination. There is new, diffuse pancreatic ductal dilatation, measuring up to 6 mm. 4. No evidence of organ metastatic disease or distant metastatic disease in the chest. 5. Small volume perihepatic ascites, unchanged, suspicious for malignant ascites, although without direct evidence of omental or peritoneal metastatic disease. 6. Coronary artery disease.   12/22/2020 PET scan   IMPRESSION: No evidence of recurrent or metastatic carcinoma.      Discussed the use of AI scribe software for clinical note transcription with the patient, who gave verbal consent to proceed.  History of Present Illness Breanna Noble is a 63 year old female with pancreatic cancer who presents for follow-up.  Her pancreatic cancer remains stable with no gastrointestinal symptoms or weight changes. A recent CT scan shows stable lymph nodes in the retroperitoneal area. Kidney and liver functions are within normal limits. Tumor marker levels are slightly elevated.  She adheres to her prescribed medications, including an iron supplement, though she is uncertain about its purpose. Her blood count is currently normal, with a previous slight decrease three months ago. Her last iron level check in June 2021 indicated slightly elevated ferritin.  She is retired and actively cares for her grandchildren, which she finds fulfilling. She  retains a port, which she is hesitant to remove due to personal beliefs.     All other systems were reviewed with the patient and are negative.  MEDICAL HISTORY:  Past Medical History:  Diagnosis Date   Cataracts, bilateral    Diverticulosis 12/07/2001   found on colonoscopy   Family history of colon cancer    Family history of pancreatic cancer    Hypothyroid 10/2007   Macular hole    pancreatic ca 08/2019   pancreatic   Pancreatic mass    PONV (postoperative nausea and vomiting)     SURGICAL HISTORY: Past Surgical History:  Procedure Laterality Date   APPENDECTOMY N/A 03/24/2020   Procedure: APPENDECTOMY;  Surgeon: Lockie Rima, MD;  Location: [facility] OR;  Service: General;  Laterality: N/A;   BILIARY STENT PLACEMENT  10/10/2019   Procedure: BILIARY STENT PLACEMENT;  Surgeon: Evangeline Hilts, MD;  Location: [facility] ENDOSCOPY;  Service: Endoscopy;;   CATARACT EXTRACTION, BILATERAL     COLONOSCOPY     ERCP N/A 10/10/2019   Procedure: ENDOSCOPIC RETROGRADE CHOLANGIOPANCREATOGRAPHY (ERCP);  Surgeon: Evangeline Hilts, MD;  Location: [facility] ENDOSCOPY;  Service: Endoscopy;  Laterality: N/A;   ESOPHAGOGASTRODUODENOSCOPY N/A 10/10/2019   Procedure: ESOPHAGOGASTRODUODENOSCOPY (EGD);  Surgeon: Evangeline Hilts, MD;  Location: [facility] ENDOSCOPY;  Service: Endoscopy;  Laterality: N/A;   EUS N/A 10/10/2019   Procedure: UPPER ENDOSCOPIC ULTRASOUND (EUS) LINEAR;  Surgeon: Evangeline Hilts, MD;  Location: [facility] ENDOSCOPY;  Service: Endoscopy;  Laterality: N/A;   EYE SURGERY     FINE NEEDLE ASPIRATION  10/10/2019   Procedure: FINE NEEDLE ASPIRATION (FNA) LINEAR;  Surgeon: Evangeline Hilts, MD;  Location: [facility]  ENDOSCOPY;  Service: Endoscopy;;   IR IMAGING GUIDED PORT INSERTION  10/25/2019   LAPAROSCOPY N/A 03/24/2020   Procedure: LAPAROSCOPY DIAGNOSTIC;  Surgeon: Lockie Rima, MD;  Location: [facility] OR;  Service: General;  Laterality: N/A;   Macular hole surg     PANCREATIC STENT PLACEMENT  10/10/2019   Procedure: PANCREATIC  STENT PLACEMENT;  Surgeon: Evangeline Hilts, MD;  Location: [facility] ENDOSCOPY;  Service: Endoscopy;;   SPHINCTEROTOMY  10/10/2019   Procedure: [address];  Surgeon: Evangeline Hilts, MD;  Location: [facility] ENDOSCOPY;  Service: Endoscopy;;   TONSILLECTOMY     TONSILLECTOMY     WHIPPLE PROCEDURE N/A 03/24/2020   Procedure: WHIPPLE PROCEDURE;  Surgeon: Lockie Rima, MD;  Location: [facility] OR;  Service: General;  Laterality: N/A;    I have reviewed the social history and family history with the patient and they are unchanged from previous note.  ALLERGIES:  has no known allergies.  MEDICATIONS:  Current Outpatient Medications  Medication Sig Dispense Refill   Ascorbic Acid (VITAMIN C PO) Take 1,000 mg by mouth daily.      atorvastatin  (LIPITOR) 10 MG tablet TAKE 1 TABLET BY MOUTH DAILY 90 tablet 0   Calcium  Carb-Cholecalciferol (CALCIUM  1000 + D PO) Take 1 tablet by mouth daily.      ferrous sulfate 325 (65 FE) MG tablet Take 325 mg by mouth daily with breakfast.     ibuprofen (ADVIL) 200 MG tablet Take 400 mg by mouth every 6 (six) hours as needed for moderate pain.     levothyroxine  (SYNTHROID , LEVOTHROID) 50 MCG tablet Take 1 tablet (50 mcg total) by mouth daily. 90 tablet 4   MAGNESIUM  CITRATE PO Take by mouth.     Multiple Vitamin (MULTIVITAMIN PO) Take 1 tablet by mouth daily.     vitamin B-12 (CYANOCOBALAMIN ) 1000 MCG tablet Take 1,000 mcg by mouth daily.     No current facility-administered medications for this visit.    PHYSICAL EXAMINATION: ECOG PERFORMANCE STATUS: 0 - Asymptomatic  Vitals:   09/26/23 1344  BP: 111/64  Pulse: 64  Resp: 17  Temp: 98 F (36.7 C)  SpO2: 100%   Wt Readings from Last 3 Encounters:  09/26/23 127 lb 14.4 oz (58 kg)  03/16/23 127 lb 9.6 oz (57.9 kg)  12/08/22 128 lb (58.1 kg)     GENERAL:alert, no distress and comfortable SKIN: skin color, texture, turgor are normal, no rashes or significant lesions EYES: normal, Conjunctiva are pink and  non-injected, sclera clear Musculoskeletal:no cyanosis of digits and no clubbing  NEURO: alert & oriented x 3 with fluent speech, no focal motor/sensory deficits  Physical Exam    LABORATORY DATA:  I have reviewed the data as listed    Latest Ref Rng & Units 09/18/2023    2:43 PM 06/19/2023    2:13 PM 03/16/2023   10:29 AM  CBC  WBC 4.0 - 10.5 K/uL 5.5  6.6  5.5   Hemoglobin 12.0 - 15.0 g/dL 16.1  09.6  04.5   Hematocrit 36.0 - 46.0 % 36.8  36.6  38.6   Platelets 150 - 400 K/uL 166  163  147         Latest Ref Rng & Units 09/18/2023    2:43 PM 06/19/2023    2:13 PM 03/16/2023   10:29 AM  CMP  Glucose 70 - 99 mg/dL 409  811  914   BUN 8 - 23 mg/dL 7  9  7    Creatinine 0.44 - 1.00 mg/dL 7.82  0.56  0.59   Sodium 135 - 145 mmol/L 138  140  140   Potassium 3.5 - 5.1 mmol/L 4.0  4.0  4.0   Chloride 98 - 111 mmol/L 104  104  104   CO2 22 - 32 mmol/L 30  32  31   Calcium  8.9 - 10.3 mg/dL 8.9  9.2  9.4   Total Protein 6.5 - 8.1 g/dL 6.6  6.4  6.8   Total Bilirubin 0.0 - 1.2 mg/dL 0.5  0.4  0.6   Alkaline Phos 38 - 126 U/L 68  71  74   AST 15 - 41 U/L 29  26  24    ALT 0 - 44 U/L 25  20  19        RADIOGRAPHIC STUDIES: I have personally reviewed the radiological images as listed and agreed with the findings in the report. No results found.    No orders of the defined types were placed in this encounter.  All questions were answered. The patient knows to call the clinic with any problems, questions or concerns. No barriers to learning was detected. The total time spent in the appointment was 25 minutes, including review of chart and various tests results, discussions about plan of care and coordination of care plan     Sonja Calvert, MD 09/26/2023

## 2023-09-30 ENCOUNTER — Other Ambulatory Visit: Payer: Self-pay | Admitting: Cardiology

## 2023-10-03 ENCOUNTER — Ambulatory Visit: Attending: Nurse Practitioner | Admitting: Nurse Practitioner

## 2023-10-03 ENCOUNTER — Encounter: Payer: Self-pay | Admitting: Nurse Practitioner

## 2023-10-03 VITALS — BP 118/74 | HR 81 | Ht 65.0 in | Wt 125.8 lb

## 2023-10-03 DIAGNOSIS — C250 Malignant neoplasm of head of pancreas: Secondary | ICD-10-CM | POA: Diagnosis not present

## 2023-10-03 DIAGNOSIS — E785 Hyperlipidemia, unspecified: Secondary | ICD-10-CM

## 2023-10-03 DIAGNOSIS — I2510 Atherosclerotic heart disease of native coronary artery without angina pectoris: Secondary | ICD-10-CM | POA: Diagnosis not present

## 2023-10-03 DIAGNOSIS — E039 Hypothyroidism, unspecified: Secondary | ICD-10-CM

## 2023-10-03 MED ORDER — ATORVASTATIN CALCIUM 10 MG PO TABS: 10.0000 mg | ORAL_TABLET | Freq: Every day | ORAL | 3 refills | Status: DC

## 2023-10-03 NOTE — Progress Notes (Signed)
 Office Visit    Patient Name: Breanna Noble Date of Encounter: 10/03/2023  Primary Care Provider:  Mordechai April, DO Primary Cardiologist:  Wendie Hamburg, MD  Chief Complaint    63 year old female with a history of nonobstructive CAD, hyperlipidemia, hypothyroidism, and pancreatic cancer s/p Whipple surgery and chemo therapy who presents for follow-up related to CAD.  Past Medical History    Past Medical History:  Diagnosis Date   Cataracts, bilateral    Diverticulosis 12/07/2001   found on colonoscopy   Family history of colon cancer    Family history of pancreatic cancer    Hypothyroid 10/2007   Macular hole    pancreatic ca 08/2019   pancreatic   Pancreatic mass    PONV (postoperative nausea and vomiting)    Past Surgical History:  Procedure Laterality Date   APPENDECTOMY N/A 03/24/2020   Procedure: APPENDECTOMY;  Surgeon: Lockie Rima, MD;  Location: [facility] OR;  Service: General;  Laterality: N/A;   BILIARY STENT PLACEMENT  10/10/2019   Procedure: BILIARY STENT PLACEMENT;  Surgeon: Evangeline Hilts, MD;  Location: [facility] ENDOSCOPY;  Service: Endoscopy;;   CATARACT EXTRACTION, BILATERAL     COLONOSCOPY     ERCP N/A 10/10/2019   Procedure: ENDOSCOPIC RETROGRADE CHOLANGIOPANCREATOGRAPHY (ERCP);  Surgeon: Evangeline Hilts, MD;  Location: [facility] ENDOSCOPY;  Service: Endoscopy;  Laterality: N/A;   ESOPHAGOGASTRODUODENOSCOPY N/A 10/10/2019   Procedure: ESOPHAGOGASTRODUODENOSCOPY (EGD);  Surgeon: Evangeline Hilts, MD;  Location: [facility] ENDOSCOPY;  Service: Endoscopy;  Laterality: N/A;   EUS N/A 10/10/2019   Procedure: UPPER ENDOSCOPIC ULTRASOUND (EUS) LINEAR;  Surgeon: Evangeline Hilts, MD;  Location: [facility] ENDOSCOPY;  Service: Endoscopy;  Laterality: N/A;   EYE SURGERY     FINE NEEDLE ASPIRATION  10/10/2019   Procedure: FINE NEEDLE ASPIRATION (FNA) LINEAR;  Surgeon: Evangeline Hilts, MD;  Location: [facility] ENDOSCOPY;  Service: Endoscopy;;   IR IMAGING GUIDED PORT INSERTION  10/25/2019   LAPAROSCOPY  N/A 03/24/2020   Procedure: LAPAROSCOPY DIAGNOSTIC;  Surgeon: Lockie Rima, MD;  Location: [facility] OR;  Service: General;  Laterality: N/A;   Macular hole surg     PANCREATIC STENT PLACEMENT  10/10/2019   Procedure: PANCREATIC STENT PLACEMENT;  Surgeon: Evangeline Hilts, MD;  Location: [facility] ENDOSCOPY;  Service: Endoscopy;;   SPHINCTEROTOMY  10/10/2019   Procedure: [address];  Surgeon: Evangeline Hilts, MD;  Location: [facility] ENDOSCOPY;  Service: Endoscopy;;   TONSILLECTOMY     TONSILLECTOMY     WHIPPLE PROCEDURE N/A 03/24/2020   Procedure: WHIPPLE PROCEDURE;  Surgeon: Lockie Rima, MD;  Location: [facility] OR;  Service: General;  Laterality: N/A;    Allergies  No Known Allergies   Labs/Other Studies Reviewed    The following studies were reviewed today:  Cardiac Studies & Procedures   ______________________________________________________________________________________________     ECHOCARDIOGRAM  ECHOCARDIOGRAM COMPLETE 07/14/2022  Narrative ECHOCARDIOGRAM REPORT    Patient Name:   Breanna Noble   Date of Exam: 07/14/2022 Medical Rec #:  [id number]     Height:       65.5 in Accession #:    [id number]    Weight:       124.8 lb Date of Birth:  May 13, 1960     BSA:          1.628 m Patient Age:    62 years      BP:           124/72 mmHg Patient Gender: F             HR:  67 bpm. Exam Location:  [address]  Procedure: 2D Echo, 3D Echo, Cardiac Doppler, Color Doppler and Strain Analysis  Indications:    R07.9 Chest Pain  History:        Patient has no prior history of Echocardiogram examinations. Abnormal ECG, Signs/Symptoms:Chest Pain; Risk Factors:Family History of Coronary Artery Disease. Pancreatic Cancer with Chemotherapy (2021-2022), Palpitations.  Sonographer:    Ewing Holiday RDCS Referring Phys: Wendie Hamburg  IMPRESSIONS   1. Left ventricular ejection fraction, by estimation, is 65 to 70%. The left ventricle has normal function. The left ventricle  has no regional wall motion abnormalities. Left ventricular diastolic parameters were normal. The average left ventricular global longitudinal strain is -26.0 %. The global longitudinal strain is normal. 2. Right ventricular systolic function is normal. The right ventricular size is normal. There is normal pulmonary artery systolic pressure. 3. The mitral valve is normal in structure. Trivial mitral valve regurgitation. No evidence of mitral stenosis. 4. The aortic valve is normal in structure. Aortic valve regurgitation is not visualized. No aortic stenosis is present. 5. The inferior vena cava is normal in size with greater than 50% respiratory variability, suggesting right atrial pressure of 3 mmHg.  FINDINGS Left Ventricle: Left ventricular ejection fraction, by estimation, is 65 to 70%. The left ventricle has normal function. The left ventricle has no regional wall motion abnormalities. The average left ventricular global longitudinal strain is -26.0 %. The global longitudinal strain is normal. The left ventricular internal cavity size was normal in size. There is no left ventricular hypertrophy. Left ventricular diastolic parameters were normal. Normal left ventricular filling pressure.  Right Ventricle: The right ventricular size is normal. No increase in right ventricular wall thickness. Right ventricular systolic function is normal. There is normal pulmonary artery systolic pressure. The tricuspid regurgitant velocity is 2.33 m/s, and with an assumed right atrial pressure of 3 mmHg, the estimated right ventricular systolic pressure is 24.7 mmHg.  Left Atrium: Left atrial size was normal in size.  Right Atrium: Right atrial size was normal in size.  Pericardium: There is no evidence of pericardial effusion.  Mitral Valve: The mitral valve is normal in structure. Trivial mitral valve regurgitation. No evidence of mitral valve stenosis.  Tricuspid Valve: The tricuspid valve is normal in  structure. Tricuspid valve regurgitation is trivial. No evidence of tricuspid stenosis.  Aortic Valve: The aortic valve is normal in structure. Aortic valve regurgitation is not visualized. No aortic stenosis is present.  Pulmonic Valve: The pulmonic valve was normal in structure. Pulmonic valve regurgitation is not visualized. No evidence of pulmonic stenosis.  Aorta: The aortic root is normal in size and structure.  Venous: The inferior vena cava is normal in size with greater than 50% respiratory variability, suggesting right atrial pressure of 3 mmHg.  IAS/Shunts: No atrial level shunt detected by color flow Doppler.   LEFT VENTRICLE PLAX 2D LVIDd:         4.40 cm   Diastology LVIDs:         2.50 cm   LV e' medial:    12.75 cm/s LV PW:         0.70 cm   LV E/e' medial:  7.8 LV IVS:        0.60 cm   LV e' lateral:   14.45 cm/s LVOT diam:     2.00 cm   LV E/e' lateral: 6.9 LV SV:         62 LV SV Index:  38        2D Longitudinal Strain LVOT Area:     3.14 cm  2D Strain GLS (A2C):   -28.8 % 2D Strain GLS (A3C):   -22.0 % 2D Strain GLS (A4C):   -27.2 % 2D Strain GLS Avg:     -26.0 %  3D Volume EF: 3D EF:        78 % LV EDV:       114 ml LV ESV:       25 ml LV SV:        89 ml  RIGHT VENTRICLE RV Basal diam:  3.60 cm RV S prime:     16.40 cm/s TAPSE (M-mode): 3.0 cm RVSP:           24.7 mmHg  LEFT ATRIUM             Index        RIGHT ATRIUM           Index LA diam:        3.20 cm 1.97 cm/m   RA Pressure: 3.00 mmHg LA Vol (A2C):   44.0 ml 27.03 ml/m  RA Area:     12.00 cm LA Vol (A4C):   45.0 ml 27.64 ml/m  RA Volume:   27.60 ml  16.95 ml/m LA Biplane Vol: 45.2 ml 27.76 ml/m AORTIC VALVE LVOT Vmax:   97.15 cm/s LVOT Vmean:  61.550 cm/s LVOT VTI:    0.198 m  AORTA Ao Root diam: 2.50 cm Ao Asc diam:  2.90 cm  MITRAL VALVE               TRICUSPID VALVE MV Area (PHT): cm         TR Peak grad:   21.7 mmHg MV Decel Time: 222 msec    TR Vmax:        233.00  cm/s MV E velocity: 99.60 cm/s  Estimated RAP:  3.00 mmHg MV A velocity: 82.30 cm/s  RVSP:           24.7 mmHg MV E/A ratio:  1.21 SHUNTS Systemic VTI:  0.20 m Systemic Diam: 2.00 cm  Maudine Sos MD Electronically signed by Maudine Sos MD Signature Date/Time: 07/14/2022/11:51:22 PM    Final      CT SCANS  CT CORONARY MORPH W/CTA COR W/SCORE 06/29/2022  Addendum 07/02/2022  1:18 PM ADDENDUM REPORT: 07/02/2022 13:16  EXAM: OVER-READ INTERPRETATION  CT CHEST  The following report is a limited chest CT over-read performed by radiologist Dr. Stephanie Peacockof [facility] on 07/02/2022. This over-read does not include interpretation of cardiac or coronary anatomy or pathology. The coronary CTA interpretation by the cardiologist is attached.  COMPARISON:  May 14, 2021.  FINDINGS: Mediastinum/Nodes: No enlarged lymph nodes within the visualized mediastinum.  Lungs/Pleura: There is no pleural effusion. The visualized lungs appear clear. RIGHT chest port.  Upper abdomen: No significant findings in the visualized upper abdomen.  Musculoskeletal/Chest wall: No chest wall mass or suspicious osseous findings within the visualized chest.  IMPRESSION: No significant extracardiac findings within the visualized chest.   Electronically Signed By: Clancy Crimes M.D. On: 07/02/2022 13:16  Narrative CLINICAL DATA:  This is a 63 year old female with anginal symptoms.  EXAM: Cardiac/Coronary  CTA  TECHNIQUE: The patient was scanned on a [employer].  FINDINGS: A 100 kV prospective scan was triggered in the descending thoracic aorta at 111 HU's. Axial non-contrast 3 mm slices were carried out through  the heart. The data set was analyzed on a dedicated work station and scored using the Agatson method. Gantry rotation speed was 250 msecs and collimation was .6 mm. No beta blockade and 0.8 mg of sl NTG was given. The 3D data set was  reconstructed in 5% intervals of the 67-82 % of the R-R cycle. Diastolic phases were analyzed on a dedicated work station using MPR, MIP and VRT modes. The patient received 80 cc of contrast.  Image Quality: Fair with misregistration artifact.  Aorta: Normal size.  No calcifications.  No dissection.  Aortic Valve:  Trileaflet.  No calcifications.  Coronary Arteries:  Normal coronary origin.  Right dominance.  RCA is a large dominant artery that gives rise to PDA and PLA. There is no plaque.  Left main is a large artery that gives rise to LAD and LCX arteries.  LAD is a large vessel. Mild C(25-49%) focal calcified plaque in the proximal LAD. The mid and distal LAD with no plaques.  LCX is a non-dominant artery that gives rise to one large OM1 branch. There is no plaque.  Coronary Calcium  Score:  Left main: 0  Left anterior descending artery: 78.3  Left circumflex artery: 0  Right coronary artery: 0  Total: 78.3  Percentile: 86  Other findings:  Normal pulmonary vein drainage into the left atrium.  Normal left atrial appendage without a thrombus.  Normal size of the pulmonary artery.  IMPRESSION: 1. Coronary calcium  score of 78.3. This was 47 percentile for age and sex matched control.  2. Normal coronary origin with right dominance.  3. CAD-RADS 2. Mild non-obstructive CAD (25-49%). Consider non-atherosclerotic causes of chest pain. Consider preventive therapy and risk factor modification.  The noncardiac portion of this study will be interpreted in separate report by the radiologist.  Electronically Signed: By: Kardie  Tobb D.O. On: 06/29/2022 15:50     ______________________________________________________________________________________________     Recent Labs: 09/18/2023: ALT 25; BUN 7; Creatinine 0.54; Hemoglobin 12.3; Platelet Count 166; Potassium 4.0; Sodium 138  Recent Lipid Panel    Component Value Date/Time   CHOL 99 (L) 09/22/2022 1226    TRIG 64 09/22/2022 1226   HDL 49 09/22/2022 1226   CHOLHDL 2.0 09/22/2022 1226   LDLCALC 36 09/22/2022 1226    History of Present Illness    63 year old female with the above past medical history including nonobstructive CAD, hyperlipidemia, hypothyroidism, and pancreatic cancer s/p Whipple surgery, and chemo therapy.  She was referred to cardiology for the evaluation of chest pain, initially seen in 2024.  Coronary CT angiogram in 06/2022 revealed nonobstructive CAD, 25 to 49% stenosis in the proximal LAD, coronary calcium  score of 78 (86 percentile).  Echocardiogram in 07/2022 showed EF 65 to 70%, normal RV function, no significant valvular abnormalities.  She was started on statin therapy.  She was last seen in the office on 09/22/2022 and was doing well from a cardiac standpoint.  She was exercising regularly.  She denied symptoms concerning for angina.   She presents today for follow-up.  Since her last visit she has done well from a cardiac standpoint.  She denies symptoms concerning for angina.  She is active, and continues to exercise regularly.  Overall, she reports feeling well.  Home Medications    Current Outpatient Medications  Medication Sig Dispense Refill   Ascorbic Acid (VITAMIN C PO) Take 1,000 mg by mouth daily.      Calcium  Carb-Cholecalciferol (CALCIUM  1000 + D PO) Take 1 tablet by mouth  daily.      ibuprofen (ADVIL) 200 MG tablet Take 400 mg by mouth every 6 (six) hours as needed for moderate pain.     levothyroxine  (SYNTHROID , LEVOTHROID) 50 MCG tablet Take 1 tablet (50 mcg total) by mouth daily. 90 tablet 4   MAGNESIUM  CITRATE PO Take by mouth.     Multiple Vitamin (MULTIVITAMIN PO) Take 1 tablet by mouth daily.     vitamin B-12 (CYANOCOBALAMIN ) 1000 MCG tablet Take 1,000 mcg by mouth daily.     atorvastatin  (LIPITOR) 10 MG tablet Take 1 tablet (10 mg total) by mouth daily. 90 tablet 3   No current facility-administered medications for this visit.     Review of  Systems    She denies chest pain, palpitations, dyspnea, pnd, orthopnea, n, v, dizziness, syncope, edema, weight gain, or early satiety. All other systems reviewed and are otherwise negative except as noted above.   Physical Exam    VS:  BP 118/74 (BP Location: Left Arm, Patient Position: Sitting, Cuff Size: Normal)   Pulse 81   Ht 5\' 5"  (1.651 m)   Wt 125 lb 12.8 oz (57.1 kg)   LMP 11/22/2013   SpO2 95%   BMI 20.93 kg/m   GEN: Well nourished, well developed, in no acute distress. HEENT: normal. Neck: Supple, no JVD, carotid bruits, or masses. Cardiac: RRR, no murmurs, rubs, or gallops. No clubbing, cyanosis, edema.  Radials/DP/PT 2+ and equal bilaterally.  Respiratory:  Respirations regular and unlabored, clear to auscultation bilaterally. GI: Soft, nontender, nondistended, BS + x 4. MS: no deformity or atrophy. Skin: warm and dry, no rash. Neuro:  Strength and sensation are intact. Psych: Normal affect.  Accessory Clinical Findings    ECG personally reviewed by me today - EKG Interpretation Date/Time:  Tuesday October 03 2023 15:31:07 EDT Ventricular Rate:  81 PR Interval:  128 QRS Duration:  88 QT Interval:  402 QTC Calculation: 466 R Axis:   37  Text Interpretation: Normal sinus rhythm Normal ECG No previous ECGs available Confirmed by Marlana Silvan ([id number]) on 10/03/2023 3:32:13 PM  - no acute changes.   Lab Results  Component Value Date   WBC 5.5 09/18/2023   HGB 12.3 09/18/2023   HCT 36.8 09/18/2023   MCV 90.2 09/18/2023   PLT 166 09/18/2023   Lab Results  Component Value Date   CREATININE 0.54 09/18/2023   BUN 7 (L) 09/18/2023   NA 138 09/18/2023   K 4.0 09/18/2023   CL 104 09/18/2023   CO2 30 09/18/2023   Lab Results  Component Value Date   ALT 25 09/18/2023   AST 29 09/18/2023   ALKPHOS 68 09/18/2023   BILITOT 0.5 09/18/2023   Lab Results  Component Value Date   CHOL 99 (L) 09/22/2022   HDL 49 09/22/2022   LDLCALC 36 09/22/2022   TRIG 64  09/22/2022   CHOLHDL 2.0 09/22/2022    No results found for: "HGBA1C"  Assessment & Plan    1. Nonobstructive CAD: Coronary CT angiogram in 06/2022 revealed nonobstructive CAD, 25 to 49% stenosis in the proximal LAD, coronary calcium  score of 78 (86 percentile).  Stable with no anginal symptoms. No indication for ischemic evaluation. Continue Lipitor.  2. Hyperlipidemia: LDL was 36 in 08/2022. Will repeat fasting lipids, LFTs. Continue Lipitor.   3. Hypothyroidism: TSH was 3.020 in 04/2023.  Monitored and managed per PCP. On levothyroxine .   4. History of pancreatic cancer: S/p Whipple procedure, chemotherapy.  Following with oncology.  5. Disposition: Follow-up in 1 year.       Jude Norton, NP 10/03/2023, 3:53 PM

## 2023-10-03 NOTE — Patient Instructions (Signed)
 Medication Instructions:  Your physician recommends that you continue on your current medications as directed. Please refer to the Current Medication list given to you today.  *If you need a refill on your cardiac medications before your next appointment, please call your pharmacy*  Lab Work: Fasting lipid panel & LFTs  Testing/Procedures: NONE ordered at this time of appointment    Follow-Up: At [facility], you and your health needs are our priority.  As part of our continuing mission to provide you with exceptional heart care, our providers are all part of one team.  This team includes your primary Cardiologist (physician) and Advanced Practice Providers or APPs (Physician Assistants and Nurse Practitioners) who all work together to provide you with the care you need, when you need it.  Your next appointment:   1 year(s)  Provider:   Wendie Hamburg, MD    We recommend signing up for the patient portal called "MyChart".  Sign up information is provided on this After Visit Summary.  MyChart is used to connect with patients for Virtual Visits (Telemedicine).  Patients are able to view lab/test results, encounter notes, upcoming appointments, etc.  Non-urgent messages can be sent to your provider as well.   To learn more about what you can do with MyChart, go to [email or web address].

## 2023-10-09 ENCOUNTER — Other Ambulatory Visit: Payer: Self-pay | Admitting: General Surgery

## 2023-10-10 DIAGNOSIS — E039 Hypothyroidism, unspecified: Secondary | ICD-10-CM | POA: Diagnosis not present

## 2023-10-10 DIAGNOSIS — I2510 Atherosclerotic heart disease of native coronary artery without angina pectoris: Secondary | ICD-10-CM | POA: Diagnosis not present

## 2023-10-10 DIAGNOSIS — E785 Hyperlipidemia, unspecified: Secondary | ICD-10-CM | POA: Diagnosis not present

## 2023-10-10 DIAGNOSIS — C250 Malignant neoplasm of head of pancreas: Secondary | ICD-10-CM | POA: Diagnosis not present

## 2023-10-11 ENCOUNTER — Encounter (HOSPITAL_BASED_OUTPATIENT_CLINIC_OR_DEPARTMENT_OTHER): Payer: Self-pay | Admitting: General Surgery

## 2023-10-11 ENCOUNTER — Other Ambulatory Visit: Payer: Self-pay

## 2023-10-11 LAB — HEPATIC FUNCTION PANEL
ALT: 26 IU/L (ref 0–32)
AST: 30 IU/L (ref 0–40)
Albumin: 4.5 g/dL (ref 3.9–4.9)
Alkaline Phosphatase: 107 IU/L (ref 44–121)
Bilirubin Total: 0.4 mg/dL (ref 0.0–1.2)
Bilirubin, Direct: 0.18 mg/dL (ref 0.00–0.40)
Total Protein: 6.6 g/dL (ref 6.0–8.5)

## 2023-10-11 LAB — LIPID PANEL
Chol/HDL Ratio: 1.8 ratio (ref 0.0–4.4)
Cholesterol, Total: 92 mg/dL — ABNORMAL LOW (ref 100–199)
HDL: 50 mg/dL (ref >39)
LDL Chol Calc (NIH): 26 mg/dL (ref 0–99)
Triglycerides: 75 mg/dL (ref 0–149)
VLDL Cholesterol Cal: 16 mg/dL (ref 5–40)

## 2023-10-12 ENCOUNTER — Ambulatory Visit: Payer: Self-pay | Admitting: Nurse Practitioner

## 2023-10-12 MED ORDER — CHLORHEXIDINE GLUCONATE CLOTH 2 % EX PADS: 6.0000 | MEDICATED_PAD | Freq: Once | CUTANEOUS | Status: DC

## 2023-10-12 NOTE — Progress Notes (Signed)

## 2023-10-18 ENCOUNTER — Other Ambulatory Visit: Payer: Self-pay

## 2023-10-18 ENCOUNTER — Encounter (HOSPITAL_BASED_OUTPATIENT_CLINIC_OR_DEPARTMENT_OTHER)
Admission: RE | Disposition: A | Discharge status: Home / Self Care | Payer: Self-pay | Source: Home / Self Care | Attending: General Surgery

## 2023-10-18 ENCOUNTER — Encounter (HOSPITAL_BASED_OUTPATIENT_CLINIC_OR_DEPARTMENT_OTHER): Payer: Self-pay | Admitting: General Surgery

## 2023-10-18 ENCOUNTER — Ambulatory Visit (HOSPITAL_BASED_OUTPATIENT_CLINIC_OR_DEPARTMENT_OTHER)
Admission: RE | Admit: 2023-10-18 | Discharge: 2023-10-18 | Disposition: A | Discharge status: Home / Self Care | Attending: General Surgery | Admitting: General Surgery

## 2023-10-18 ENCOUNTER — Ambulatory Visit (HOSPITAL_BASED_OUTPATIENT_CLINIC_OR_DEPARTMENT_OTHER): Admitting: Anesthesiology

## 2023-10-18 DIAGNOSIS — Z8507 Personal history of malignant neoplasm of pancreas: Secondary | ICD-10-CM | POA: Diagnosis not present

## 2023-10-18 DIAGNOSIS — Z452 Encounter for adjustment and management of vascular access device: Secondary | ICD-10-CM | POA: Insufficient documentation

## 2023-10-18 DIAGNOSIS — C250 Malignant neoplasm of head of pancreas: Secondary | ICD-10-CM | POA: Diagnosis not present

## 2023-10-18 DIAGNOSIS — Z800 Family history of malignant neoplasm of digestive organs: Secondary | ICD-10-CM | POA: Diagnosis not present

## 2023-10-18 DIAGNOSIS — C259 Malignant neoplasm of pancreas, unspecified: Secondary | ICD-10-CM | POA: Diagnosis not present

## 2023-10-18 DIAGNOSIS — E039 Hypothyroidism, unspecified: Secondary | ICD-10-CM | POA: Diagnosis not present

## 2023-10-18 HISTORY — PX: PORT-A-CATH REMOVAL: SHX5289

## 2023-10-18 SURGERY — REMOVAL PORT-A-CATH
Anesthesia: Monitor Anesthesia Care | Site: Chest

## 2023-10-18 MED ORDER — ACETAMINOPHEN 500 MG PO TABS
ORAL_TABLET | ORAL | Status: AC
Start: 2023-10-18 — End: 2023-10-18
  Filled 2023-10-18: qty 2

## 2023-10-18 MED ORDER — ACETAMINOPHEN 500 MG PO TABS
1000.0000 mg | ORAL_TABLET | ORAL | Status: AC
  Administered 2023-10-18: 1000 mg via ORAL

## 2023-10-18 MED ORDER — DEXAMETHASONE SODIUM PHOSPHATE 10 MG/ML IJ SOLN
INTRAMUSCULAR | Status: AC
  Filled 2023-10-18: qty 1

## 2023-10-18 MED ORDER — LIDOCAINE HCL (PF) 1 % IJ SOLN
INTRAMUSCULAR | Status: AC
Start: 2023-10-18 — End: 2023-10-18
  Filled 2023-10-18: qty 30

## 2023-10-18 MED ORDER — AMISULPRIDE (ANTIEMETIC) 5 MG/2ML IV SOLN: 10.0000 mg | Freq: Once | INTRAVENOUS | Status: DC | PRN

## 2023-10-18 MED ORDER — MIDAZOLAM HCL 2 MG/2ML IJ SOLN
INTRAMUSCULAR | Status: DC | PRN
  Administered 2023-10-18: 1 mg via INTRAVENOUS

## 2023-10-18 MED ORDER — KETOROLAC TROMETHAMINE 30 MG/ML IJ SOLN: 30.0000 mg | Freq: Once | INTRAMUSCULAR | Status: DC | PRN

## 2023-10-18 MED ORDER — ONDANSETRON HCL 4 MG/2ML IJ SOLN
INTRAMUSCULAR | Status: AC
Start: 2023-10-18 — End: 2023-10-18
  Filled 2023-10-18: qty 4

## 2023-10-18 MED ORDER — BUPIVACAINE-EPINEPHRINE (PF) 0.25% -1:200000 IJ SOLN
INTRAMUSCULAR | Status: AC
  Filled 2023-10-18: qty 30

## 2023-10-18 MED ORDER — DEXAMETHASONE SODIUM PHOSPHATE 10 MG/ML IJ SOLN
INTRAMUSCULAR | Status: DC | PRN
  Administered 2023-10-18: 5 mg via INTRAVENOUS

## 2023-10-18 MED ORDER — LIDOCAINE 2% (20 MG/ML) 5 ML SYRINGE
INTRAMUSCULAR | Status: AC
  Filled 2023-10-18: qty 10

## 2023-10-18 MED ORDER — FENTANYL CITRATE (PF) 100 MCG/2ML IJ SOLN
INTRAMUSCULAR | Status: AC
  Filled 2023-10-18: qty 2

## 2023-10-18 MED ORDER — LIDOCAINE HCL 1 % IJ SOLN
INTRAMUSCULAR | Status: DC | PRN
  Administered 2023-10-18: 19 mL via INTRAMUSCULAR

## 2023-10-18 MED ORDER — CEFAZOLIN SODIUM-DEXTROSE 2-4 GM/100ML-% IV SOLN
2.0000 g | INTRAVENOUS | Status: AC
  Administered 2023-10-18: 2 g via INTRAVENOUS

## 2023-10-18 MED ORDER — CEFAZOLIN SODIUM-DEXTROSE 2-4 GM/100ML-% IV SOLN
INTRAVENOUS | Status: AC
  Filled 2023-10-18: qty 100

## 2023-10-18 MED ORDER — OXYCODONE HCL 5 MG/5ML PO SOLN: 5.0000 mg | Freq: Once | ORAL | Status: DC | PRN

## 2023-10-18 MED ORDER — PHENYLEPHRINE 80 MCG/ML (10ML) SYRINGE FOR IV PUSH (FOR BLOOD PRESSURE SUPPORT)
PREFILLED_SYRINGE | INTRAVENOUS | Status: AC
  Filled 2023-10-18: qty 30

## 2023-10-18 MED ORDER — PROPOFOL 500 MG/50ML IV EMUL
INTRAVENOUS | Status: DC | PRN
  Administered 2023-10-18: 125 ug/kg/min via INTRAVENOUS

## 2023-10-18 MED ORDER — LIDOCAINE 2% (20 MG/ML) 5 ML SYRINGE
INTRAMUSCULAR | Status: AC
  Filled 2023-10-18: qty 20

## 2023-10-18 MED ORDER — PHENYLEPHRINE HCL (PRESSORS) 10 MG/ML IV SOLN
INTRAVENOUS | Status: DC | PRN
  Administered 2023-10-18: 80 ug via INTRAVENOUS

## 2023-10-18 MED ORDER — OXYCODONE HCL 5 MG PO TABS: 5.0000 mg | ORAL_TABLET | Freq: Four times a day (QID) | ORAL | 0 refills | Status: DC | PRN

## 2023-10-18 MED ORDER — FENTANYL CITRATE (PF) 100 MCG/2ML IJ SOLN: 25.0000 ug | INTRAMUSCULAR | Status: DC | PRN

## 2023-10-18 MED ORDER — ONDANSETRON HCL 4 MG/2ML IJ SOLN
INTRAMUSCULAR | Status: DC | PRN
Start: 2023-10-18 — End: 2023-10-18
  Administered 2023-10-18: 4 mg via INTRAVENOUS

## 2023-10-18 MED ORDER — MIDAZOLAM HCL 2 MG/2ML IJ SOLN
INTRAMUSCULAR | Status: AC
  Filled 2023-10-18: qty 2

## 2023-10-18 MED ORDER — FENTANYL CITRATE (PF) 100 MCG/2ML IJ SOLN
INTRAMUSCULAR | Status: DC | PRN
  Administered 2023-10-18: 25 ug via INTRAVENOUS

## 2023-10-18 MED ORDER — PROPOFOL 500 MG/50ML IV EMUL
INTRAVENOUS | Status: AC
  Filled 2023-10-18: qty 50

## 2023-10-18 MED ORDER — LACTATED RINGERS IV SOLN: INTRAVENOUS | Status: DC

## 2023-10-18 MED ORDER — OXYCODONE HCL 5 MG PO TABS: 5.0000 mg | ORAL_TABLET | Freq: Once | ORAL | Status: DC | PRN

## 2023-10-18 SURGICAL SUPPLY — 26 items
BLADE HEX COATED 2.75 (ELECTRODE) ×2 IMPLANT
BLADE SURG 15 STRL LF DISP TIS (BLADE) ×2 IMPLANT
CANISTER SUCT 1200ML W/VALVE (MISCELLANEOUS) IMPLANT
CHLORAPREP W/TINT 26 (MISCELLANEOUS) ×2 IMPLANT
COVER BACK TABLE 60X90IN (DRAPES) ×2 IMPLANT
COVER MAYO STAND STRL (DRAPES) ×2 IMPLANT
DERMABOND ADVANCED .7 DNX12 (GAUZE/BANDAGES/DRESSINGS) ×2 IMPLANT
DRAPE LAPAROTOMY 100X72 PEDS (DRAPES) ×2 IMPLANT
DRAPE UTILITY XL STRL (DRAPES) ×2 IMPLANT
ELECTRODE REM PT RTRN 9FT ADLT (ELECTROSURGICAL) ×2 IMPLANT
GLOVE BIO SURGEON STRL SZ 6 (GLOVE) ×2 IMPLANT
GLOVE BIOGEL PI IND STRL 6.5 (GLOVE) ×2 IMPLANT
GOWN STRL REUS W/ TWL LRG LVL3 (GOWN DISPOSABLE) ×2 IMPLANT
GOWN STRL REUS W/ TWL XL LVL3 (GOWN DISPOSABLE) ×2 IMPLANT
NDL HYPO 25X1 1.5 SAFETY (NEEDLE) ×2 IMPLANT
NEEDLE HYPO 25X1 1.5 SAFETY (NEEDLE) ×1 IMPLANT
NS IRRIG 1000ML POUR BTL (IV SOLUTION) IMPLANT
PACK BASIN DAY SURGERY FS (CUSTOM PROCEDURE TRAY) ×2 IMPLANT
PENCIL SMOKE EVACUATOR (MISCELLANEOUS) ×2 IMPLANT
SPIKE FLUID TRANSFER (MISCELLANEOUS) IMPLANT
SUT MNCRL AB 4-0 PS2 18 (SUTURE) ×2 IMPLANT
SUT VIC AB 3-0 SH 27X BRD (SUTURE) ×2 IMPLANT
SYR CONTROL 10ML LL (SYRINGE) ×2 IMPLANT
TOWEL GREEN STERILE FF (TOWEL DISPOSABLE) ×2 IMPLANT
TUBE CONNECTING 20X1/4 (TUBING) IMPLANT
YANKAUER SUCT BULB TIP NO VENT (SUCTIONS) IMPLANT

## 2023-10-18 NOTE — Transfer of Care (Signed)
 Immediate Anesthesia Transfer of Care Note  Patient: Breanna Noble  Procedure(s) Performed: REMOVAL PORT-A-CATH (Chest)  Patient Location: PACU  Anesthesia Type:MAC  Level of Consciousness: awake and patient cooperative  Airway & Oxygen Therapy: Patient Spontanous Breathing and Patient connected to face mask oxygen  Post-op Assessment: Report given to RN and Post -op Vital signs reviewed and stable  Post vital signs: Reviewed and stable  Last Vitals:  Vitals Value Taken Time  BP 95/52 10/18/23 11:22  Temp    Pulse 64 10/18/23 11:24  Resp 17 10/18/23 11:24  SpO2 100 % 10/18/23 11:24  Vitals shown include unfiled device data.  Last Pain:  Vitals:   10/18/23 0846  TempSrc: Temporal  PainSc: 0-No pain         Complications: No notable events documented.

## 2023-10-18 NOTE — Op Note (Signed)
  PRE-OPERATIVE DIAGNOSIS:  un-needed Port-A-Cath for pancreatic cancer  POST-OPERATIVE DIAGNOSIS:  Same   PROCEDURE:  Procedure(s):  REMOVAL PORT-A-CATH  SURGEON:  Surgeon(s):  Lockie Rima, MD  ANESTHESIA:   MAC + local  EBL:   Minimal  SPECIMEN:  None  Complications : none known  Procedure:   Pt was  identified in the holding area and taken to the operating room where she was placed supine on the operating room table.  MAC anesthesia was induced.  The right upper chest was prepped and draped.  The prior incision was anesthetized with local anesthetic.  The incision was opened with a #15 blade.  The subcutaneous tissue was divided with the cautery.  The port was identified and the capsule opened.  The four 2-0 prolene sutures were removed.  The port was then removed and pressure held on the tract.  The catheter appeared intact without evidence of breakage.  The wound was inspected for hemostasis, which was achieved with cautery.  The wound was closed with 3-0 vicryl deep dermal interrupted sutures and 4-0 Monocryl running subcuticular suture.  The wound was cleaned, dried, and dressed with dermabond.  The patient was awakened from anesthesia and taken to the PACU in stable condition.  Needle, sponge, and instrument counts are correct.

## 2023-10-18 NOTE — H&P (Signed)
 Breanna Noble is an 63 y.o. female.   Chief Complaint: ;port in place HPI:  Pt is s/p treatment for pancreatic cancer in 2021.  She has done well without signs of recurrence and desires port removal.    Past Medical History:  Diagnosis Date   Cataracts, bilateral    Diverticulosis 12/07/2001   found on colonoscopy   Family history of colon cancer    Family history of pancreatic cancer    Hypothyroid 10/2007   Macular hole    pancreatic ca 08/2019   pancreatic   Pancreatic mass    PONV (postoperative nausea and vomiting)     Past Surgical History:  Procedure Laterality Date   APPENDECTOMY N/A 03/24/2020   Procedure: APPENDECTOMY;  Surgeon: Lockie Rima, MD;  Location: [facility] OR;  Service: General;  Laterality: N/A;   BILIARY STENT PLACEMENT  10/10/2019   Procedure: BILIARY STENT PLACEMENT;  Surgeon: Evangeline Hilts, MD;  Location: [facility] ENDOSCOPY;  Service: Endoscopy;;   CATARACT EXTRACTION, BILATERAL     COLONOSCOPY     ERCP N/A 10/10/2019   Procedure: ENDOSCOPIC RETROGRADE CHOLANGIOPANCREATOGRAPHY (ERCP);  Surgeon: Evangeline Hilts, MD;  Location: [facility] ENDOSCOPY;  Service: Endoscopy;  Laterality: N/A;   ESOPHAGOGASTRODUODENOSCOPY N/A 10/10/2019   Procedure: ESOPHAGOGASTRODUODENOSCOPY (EGD);  Surgeon: Evangeline Hilts, MD;  Location[facility] ENDOSCOPY;  Service: Endoscopy;  Laterality: N/A;   EUS N/A 10/10/2019   Procedure: UPPER ENDOSCOPIC ULTRASOUND (EUS) LINEAR;  Surgeon: Evangeline Hilts, MD;  Location: [facility] ENDOSCOPY;  Service: Endoscopy;  Laterality: N/A;   EYE SURGERY     FINE NEEDLE ASPIRATION  10/10/2019   Procedure: FINE NEEDLE ASPIRATION (FNA) LINEAR;  Surgeon: Evangeline Hilts, MD;  Location: [facility] ENDOSCOPY;  Service: Endoscopy;;   IR IMAGING GUIDED PORT INSERTION  10/25/2019   LAPAROSCOPY N/A 03/24/2020   Procedure: LAPAROSCOPY DIAGNOSTIC;  Surgeon: Lockie Rima, MD;  Location: [facility] OR;  Service: General;  Laterality: N/A;   Macular hole surg     PANCREATIC STENT PLACEMENT  10/10/2019    Procedure: PANCREATIC STENT PLACEMENT;  Surgeon: Evangeline Hilts, MD;  Location: [facility] ENDOSCOPY;  Service: Endoscopy;;   SPHINCTEROTOMY  10/10/2019   Procedure: [address];  Surgeon: Evangeline Hilts, MD;  Location: [facility] ENDOSCOPY;  Service: Endoscopy;;   TONSILLECTOMY     TONSILLECTOMY     WHIPPLE PROCEDURE N/A 03/24/2020   Procedure: WHIPPLE PROCEDURE;  Surgeon: Lockie Rima, MD;  Location: [facility] OR;  Service: General;  Laterality: N/A;    Family History  Problem Relation Age of Onset   Hypertension Father    Heart disease Father    Cancer Maternal Grandmother        PANCREATIC   Heart disease Maternal Grandfather    Cancer Maternal Grandfather        lung cancer   Diabetes Paternal Grandfather    Cancer Maternal Uncle 16       Colon   Other Mother        Brain tumor benign   Dementia Maternal Uncle    Dementia Paternal Grandmother    Stroke Paternal Uncle    Breast cancer Neg Hx    Social History:  reports that she has never smoked. She has never used smokeless tobacco. She reports that she does not drink alcohol and does not use drugs.  Allergies: No Known Allergies  Medications Prior to Admission  Medication Sig Dispense Refill   Ascorbic Acid (VITAMIN C PO) Take 1,000 mg by mouth daily.      atorvastatin  (LIPITOR) 10 MG tablet Take 1  tablet (10 mg total) by mouth daily. 90 tablet 3   Calcium  Carb-Cholecalciferol (CALCIUM  1000 + D PO) Take 1 tablet by mouth daily.      levothyroxine  (SYNTHROID , LEVOTHROID) 50 MCG tablet Take 1 tablet (50 mcg total) by mouth daily. 90 tablet 4   MAGNESIUM  CITRATE PO Take by mouth.     Multiple Vitamin (MULTIVITAMIN PO) Take 1 tablet by mouth daily.     vitamin B-12 (CYANOCOBALAMIN ) 1000 MCG tablet Take 1,000 mcg by mouth daily.     ibuprofen (ADVIL) 200 MG tablet Take 400 mg by mouth every 6 (six) hours as needed for moderate pain.      No results found for this or any previous visit (from the past 48 hours). No results found.  Review  of Systems  All other systems reviewed and are negative.   Blood pressure 122/68, pulse 76, temperature (!) 97.3 F (36.3 C), temperature source Temporal, resp. rate 16, height 5' 5 (1.651 m), weight 56.4 kg, last menstrual period 11/22/2013, SpO2 100%. Physical Exam Vitals reviewed.  Constitutional:      General: She is not in acute distress.    Appearance: Normal appearance. She is not ill-appearing.     Comments: Very thin  HENT:     Head: Normocephalic and atraumatic.     Right Ear: External ear normal.     Left Ear: External ear normal.     Nose: Nose normal.     Mouth/Throat:     Mouth: Mucous membranes are moist.     Pharynx: No oropharyngeal exudate.   Eyes:     General: No scleral icterus.    Extraocular Movements: Extraocular movements intact.     Pupils: Pupils are equal, round, and reactive to light.    Cardiovascular:     Rate and Rhythm: Normal rate.  Pulmonary:     Effort: Pulmonary effort is normal. No respiratory distress.     Comments: Right sided port in place Abdominal:     General: Abdomen is flat.   Musculoskeletal:     Cervical back: Normal range of motion and neck supple.   Skin:    General: Skin is warm and dry.     Capillary Refill: Capillary refill takes 2 to 3 seconds.     Coloration: Skin is not jaundiced or pale.   Neurological:     General: No focal deficit present.     Mental Status: She is alert and oriented to person, place, and time.     Motor: No weakness.     Coordination: Coordination normal.   Psychiatric:        Mood and Affect: Mood normal.        Behavior: Behavior normal.        Thought Content: Thought content normal.      Assessment/Plan Port in place H/o pancreatic cancer  Plan port removal Discussed procedure, risks, recovery with patient and spouse. They wish to proceed.   Lockie Rima, MD 10/18/2023, 10:14 AM

## 2023-10-18 NOTE — Anesthesia Preprocedure Evaluation (Signed)
 Anesthesia Evaluation  Patient identified by MRN, date of birth, ID band Patient awake    Reviewed: Allergy & Precautions, NPO status , Patient's Chart, lab work & pertinent test results  History of Anesthesia Complications (+) PONV and history of anesthetic complications  Airway Mallampati: II  TM Distance: >3 FB Neck ROM: Full    Dental no notable dental hx.    Pulmonary neg pulmonary ROS   Pulmonary exam normal        Cardiovascular negative cardio ROS Normal cardiovascular exam     Neuro/Psych negative neurological ROS  negative psych ROS   GI/Hepatic negative GI ROS, Neg liver ROS,,,  Endo/Other  Hypothyroidism  pancreatic cancer  Renal/GU negative Renal ROS     Musculoskeletal negative musculoskeletal ROS (+)    Abdominal   Peds  Hematology negative hematology ROS (+)   Anesthesia Other Findings Malignant neoplasm of head of pancreas  Reproductive/Obstetrics                             Anesthesia Physical Anesthesia Plan  ASA: 2  Anesthesia Plan: MAC   Post-op Pain Management:    Induction: Intravenous  PONV Risk Score and Plan: 3 and Ondansetron , Dexamethasone , Propofol  infusion, Midazolam  and Treatment may vary due to age or medical condition  Airway Management Planned: Simple Face Mask  Additional Equipment:   Intra-op Plan:   Post-operative Plan:   Informed Consent: I have reviewed the patients History and Physical, chart, labs and discussed the procedure including the risks, benefits and alternatives for the proposed anesthesia with the patient or authorized representative who has indicated his/her understanding and acceptance.     Dental advisory given  Plan Discussed with: CRNA  Anesthesia Plan Comments:        Anesthesia Quick Evaluation

## 2023-10-18 NOTE — Discharge Instructions (Addendum)
 [facility] Office Phone Number [phone]   POST OP INSTRUCTIONS  Always review your discharge instruction sheet given to you by the facility where your surgery was performed.  IF YOU HAVE DISABILITY OR FAMILY LEAVE FORMS, YOU MUST BRING THEM TO THE OFFICE FOR PROCESSING.  DO NOT GIVE THEM TO YOUR DOCTOR.  Take 2 tylenol  (acetominophen) three times a day for 3 days.  If you still have pain, add ibuprofen with food in between if able to take this (if you have kidney issues or stomach issues, do not take ibuprofen).  If both of those are not enough, add the narcotic pain pill.  If you find you are needing a lot of this overnight after surgery, call the next morning for a refill.   Take your usually prescribed medications unless otherwise directed If you need a refill on your pain medication, please contact your pharmacy.  They will contact our office to request authorization.  Prescriptions will not be filled after 5pm or on week-ends. You should eat very light the first 24 hours after surgery, such as soup, crackers, pudding, etc.  Resume your normal diet the day after surgery It is common to experience some constipation if taking pain medication after surgery.  Increasing fluid intake and taking a stool softener will usually help or prevent this problem from occurring.  A mild laxative (Milk of Magnesia or Miralax) should be taken according to package directions if there are no bowel movements after 48 hours. You may shower in 48 hours.  The surgical glue will flake off in 2-3 weeks.   ACTIVITIES:  No strenuous activity or heavy lifting for 1 week.   You may drive when you no longer are taking prescription pain medication, you can comfortably wear a seatbelt, and you can safely maneuver your car and apply brakes. RETURN TO WORK:  __________2-7 days if applicable_______________ You should see your doctor in the office for a follow-up appointment approximately three-four weeks  after your surgery.    WHEN TO CALL YOUR DOCTOR: Fever over 101.0 Nausea and/or vomiting. Extreme swelling or bruising. Continued bleeding from incision. Increased pain, redness, or drainage from the incision.  The clinic staff is available to answer your questions during regular business hours.  Please don't hesitate to call and ask to speak to one of the nurses for clinical concerns.  If you have a medical emergency, go to the nearest emergency room or call 911.  A surgeon from [facility] is always on call at the hospital.  For further questions, please visit [email or web address]     Post Anesthesia Home Care Instructions  Activity: Get plenty of rest for the remainder of the day. A responsible individual must stay with you for 24 hours following the procedure.  For the next 24 hours, DO NOT: -Drive a car -Advertising copywriter -Drink alcoholic beverages -Take any medication unless instructed by your physician -Make any legal decisions or sign important papers.  Meals: Start with liquid foods such as gelatin or soup. Progress to regular foods as tolerated. Avoid greasy, spicy, heavy foods. If nausea and/or vomiting occur, drink only clear liquids until the nausea and/or vomiting subsides. Call your physician if vomiting continues.  Special Instructions/Symptoms: Your throat may feel dry or sore from the anesthesia or the breathing tube placed in your throat during surgery. If this causes discomfort, gargle with warm salt water . The discomfort should disappear within 24 hours.  If you had a scopolamine  patch placed behind your ear  for the management of post- operative nausea and/or vomiting:  1. The medication in the patch is effective for 72 hours, after which it should be removed.  Wrap patch in a tissue and discard in the trash. Wash hands thoroughly with soap and water . 2. You may remove the patch earlier than 72 hours if you experience unpleasant side effects  which may include dry mouth, dizziness or visual disturbances. 3. Avoid touching the patch. Wash your hands with soap and water  after contact with the patch.     Next dose of Tylenol  may be given at 2:45pm if needed.

## 2023-10-18 NOTE — Anesthesia Postprocedure Evaluation (Signed)
 Anesthesia Post Note  Patient: Breanna Noble  Procedure(s) Performed: REMOVAL PORT-A-CATH (Chest)     Patient location during evaluation: PACU Anesthesia Type: MAC Level of consciousness: awake Pain management: pain level controlled Vital Signs Assessment: post-procedure vital signs reviewed and stable Respiratory status: spontaneous breathing, nonlabored ventilation and respiratory function stable Cardiovascular status: blood pressure returned to baseline and stable Postop Assessment: no apparent nausea or vomiting Anesthetic complications: no   No notable events documented.  Last Vitals:  Vitals:   10/18/23 1215 10/18/23 1242  BP: (!) 111/93 (!) 108/47  Pulse: (!) 57 (!) 55  Resp: 20 20  Temp:  (!) 36.4 C  SpO2: 100% 99%    Last Pain:  Vitals:   10/18/23 1242  TempSrc: Temporal  PainSc: 0-No pain                 Hawa Henly P Dioselina Brumbaugh

## 2023-10-19 ENCOUNTER — Encounter (HOSPITAL_BASED_OUTPATIENT_CLINIC_OR_DEPARTMENT_OTHER): Payer: Self-pay | Admitting: General Surgery

## 2023-10-26 DIAGNOSIS — E039 Hypothyroidism, unspecified: Secondary | ICD-10-CM | POA: Diagnosis not present

## 2023-10-26 DIAGNOSIS — Z0000 Encounter for general adult medical examination without abnormal findings: Secondary | ICD-10-CM | POA: Diagnosis not present

## 2023-10-26 DIAGNOSIS — R509 Fever, unspecified: Secondary | ICD-10-CM | POA: Diagnosis not present

## 2023-10-26 DIAGNOSIS — E785 Hyperlipidemia, unspecified: Secondary | ICD-10-CM | POA: Diagnosis not present

## 2023-10-26 DIAGNOSIS — Z789 Other specified health status: Secondary | ICD-10-CM | POA: Diagnosis not present

## 2023-10-27 ENCOUNTER — Telehealth: Payer: Self-pay | Admitting: Cardiology

## 2023-10-27 MED ORDER — ATORVASTATIN CALCIUM 10 MG PO TABS: 10.0000 mg | ORAL_TABLET | Freq: Every day | ORAL | 3 refills | Status: DC

## 2023-10-27 NOTE — Telephone Encounter (Signed)
 Pt's medication was sent to pt's pharmacy as requested. Confirmation received.

## 2023-10-27 NOTE — Telephone Encounter (Signed)
*  STAT* If patient is at the pharmacy, call can be transferred to refill team.   1. Which medications need to be refilled? (please list name of each medication and dose if known)   atorvastatin  (LIPITOR) 10 MG tablet   2. Would you like to learn more about the convenience, safety, & potential cost savings by using the [facility]?   3. Are you open to using the [facility] (Type [facility]. ).  4. Which pharmacy/location (including street and city if local pharmacy) is medication to be sent to?  [id number] - [place], [place] - [address]   5. Do they need a 30 day or 90 day supply?   90 day  Patient stated she still has some medication.  Patient wants a call back to confirm refill sent.

## 2023-11-16 DIAGNOSIS — Z6820 Body mass index (BMI) 20.0-20.9, adult: Secondary | ICD-10-CM | POA: Diagnosis not present

## 2023-11-16 DIAGNOSIS — H8309 Labyrinthitis, unspecified ear: Secondary | ICD-10-CM | POA: Diagnosis not present

## 2023-12-25 ENCOUNTER — Other Ambulatory Visit: Payer: BC Managed Care – PPO

## 2023-12-25 ENCOUNTER — Other Ambulatory Visit

## 2024-01-30 ENCOUNTER — Inpatient Hospital Stay: Attending: Hematology

## 2024-01-30 ENCOUNTER — Encounter: Payer: Self-pay | Admitting: Hematology

## 2024-01-30 DIAGNOSIS — C250 Malignant neoplasm of head of pancreas: Secondary | ICD-10-CM | POA: Insufficient documentation

## 2024-01-30 LAB — CBC WITH DIFFERENTIAL (CANCER CENTER ONLY)
Abs Immature Granulocytes: 0.02 K/uL (ref 0.00–0.07)
Basophils Absolute: 0 K/uL (ref 0.0–0.1)
Basophils Relative: 1 %
Eosinophils Absolute: 0.1 K/uL (ref 0.0–0.5)
Eosinophils Relative: 1 %
HCT: 39.7 % (ref 36.0–46.0)
Hemoglobin: 12.9 g/dL (ref 12.0–15.0)
Immature Granulocytes: 0 %
Lymphocytes Relative: 46 %
Lymphs Abs: 2.9 K/uL (ref 0.7–4.0)
MCH: 29.7 pg (ref 26.0–34.0)
MCHC: 32.5 g/dL (ref 30.0–36.0)
MCV: 91.5 fL (ref 80.0–100.0)
Monocytes Absolute: 0.6 K/uL (ref 0.1–1.0)
Monocytes Relative: 10 %
Neutro Abs: 2.6 K/uL (ref 1.7–7.7)
Neutrophils Relative %: 42 %
Platelet Count: 167 K/uL (ref 150–400)
RBC: 4.34 MIL/uL (ref 3.87–5.11)
RDW: 13.5 % (ref 11.5–15.5)
WBC Count: 6.2 K/uL (ref 4.0–10.5)
nRBC: 0.3 % — ABNORMAL HIGH (ref 0.0–0.2)

## 2024-01-30 LAB — CMP (CANCER CENTER ONLY)
ALT: 17 U/L (ref 0–44)
AST: 24 U/L (ref 15–41)
Albumin: 4.6 g/dL (ref 3.5–5.0)
Alkaline Phosphatase: 71 U/L (ref 38–126)
Anion gap: 3 — ABNORMAL LOW (ref 5–15)
BUN: 8 mg/dL (ref 8–23)
CO2: 34 mmol/L — ABNORMAL HIGH (ref 22–32)
Calcium: 9.5 mg/dL (ref 8.9–10.3)
Chloride: 105 mmol/L (ref 98–111)
Creatinine: 0.68 mg/dL (ref 0.44–1.00)
GFR, Estimated: >60 mL/min (ref >60)
Glucose, Bld: 80 mg/dL (ref 70–99)
Potassium: 4.1 mmol/L (ref 3.5–5.1)
Sodium: 142 mmol/L (ref 135–145)
Total Bilirubin: 0.5 mg/dL (ref 0.0–1.2)
Total Protein: 6.9 g/dL (ref 6.5–8.1)

## 2024-01-31 LAB — CANCER ANTIGEN 19-9: CA 19-9: 38 U/mL — ABNORMAL HIGH (ref 0–35)

## 2024-02-04 ENCOUNTER — Encounter: Payer: Self-pay | Admitting: Hematology

## 2024-02-14 ENCOUNTER — Encounter: Payer: Self-pay | Admitting: Hematology

## 2024-03-05 ENCOUNTER — Encounter: Payer: Self-pay | Admitting: Cardiology

## 2024-03-05 MED ORDER — ATORVASTATIN CALCIUM 10 MG PO TABS: 10.0000 mg | ORAL_TABLET | Freq: Every day | ORAL | 2 refills | Status: AC

## 2024-03-14 ENCOUNTER — Encounter: Payer: Self-pay | Admitting: Hematology

## 2024-04-12 ENCOUNTER — Other Ambulatory Visit: Payer: Self-pay

## 2024-04-12 DIAGNOSIS — C250 Malignant neoplasm of head of pancreas: Secondary | ICD-10-CM

## 2024-04-15 ENCOUNTER — Ambulatory Visit: Payer: Self-pay | Admitting: Nurse Practitioner

## 2024-04-15 ENCOUNTER — Other Ambulatory Visit: Payer: BC Managed Care – PPO

## 2024-04-15 ENCOUNTER — Inpatient Hospital Stay: Attending: Hematology

## 2024-04-15 DIAGNOSIS — C250 Malignant neoplasm of head of pancreas: Secondary | ICD-10-CM | POA: Diagnosis present

## 2024-04-15 LAB — CBC WITH DIFFERENTIAL (CANCER CENTER ONLY)
Abs Immature Granulocytes: 0.01 K/uL (ref 0.00–0.07)
Basophils Absolute: 0 K/uL (ref 0.0–0.1)
Basophils Relative: 0 %
Eosinophils Absolute: 0.1 K/uL (ref 0.0–0.5)
Eosinophils Relative: 1 %
HCT: 37.3 % (ref 36.0–46.0)
Hemoglobin: 12.1 g/dL (ref 12.0–15.0)
Immature Granulocytes: 0 %
Lymphocytes Relative: 33 %
Lymphs Abs: 2.7 K/uL (ref 0.7–4.0)
MCH: 29.4 pg (ref 26.0–34.0)
MCHC: 32.4 g/dL (ref 30.0–36.0)
MCV: 90.8 fL (ref 80.0–100.0)
Monocytes Absolute: 0.8 K/uL (ref 0.1–1.0)
Monocytes Relative: 10 %
Neutro Abs: 4.5 K/uL (ref 1.7–7.7)
Neutrophils Relative %: 56 %
Platelet Count: 189 K/uL (ref 150–400)
RBC: 4.11 MIL/uL (ref 3.87–5.11)
RDW: 14.1 % (ref 11.5–15.5)
WBC Count: 8.1 K/uL (ref 4.0–10.5)
nRBC: 0 % (ref 0.0–0.2)

## 2024-04-15 LAB — CMP (CANCER CENTER ONLY)
ALT: 8 U/L (ref 0–44)
AST: 31 U/L (ref 15–41)
Albumin: 4.4 g/dL (ref 3.5–5.0)
Alkaline Phosphatase: 92 U/L (ref 38–126)
Anion gap: 10 (ref 5–15)
BUN: 6 mg/dL — ABNORMAL LOW (ref 8–23)
CO2: 28 mmol/L (ref 22–32)
Calcium: 9.5 mg/dL (ref 8.9–10.3)
Chloride: 102 mmol/L (ref 98–111)
Creatinine: 0.63 mg/dL (ref 0.44–1.00)
GFR, Estimated: >60 mL/min (ref >60)
Glucose, Bld: 97 mg/dL (ref 70–99)
Potassium: 4 mmol/L (ref 3.5–5.1)
Sodium: 140 mmol/L (ref 135–145)
Total Bilirubin: 0.4 mg/dL (ref 0.0–1.2)
Total Protein: 7 g/dL (ref 6.5–8.1)

## 2024-04-16 ENCOUNTER — Encounter: Payer: Self-pay | Admitting: Hematology

## 2024-04-16 LAB — CANCER ANTIGEN 19-9: CA 19-9: 415 U/mL — ABNORMAL HIGH (ref 0–35)

## 2024-04-17 ENCOUNTER — Other Ambulatory Visit: Payer: Self-pay

## 2024-04-17 ENCOUNTER — Telehealth: Payer: Self-pay | Admitting: Hematology

## 2024-04-18 ENCOUNTER — Other Ambulatory Visit: Payer: Self-pay

## 2024-04-18 ENCOUNTER — Inpatient Hospital Stay

## 2024-04-18 DIAGNOSIS — C250 Malignant neoplasm of head of pancreas: Secondary | ICD-10-CM

## 2024-04-18 LAB — GENETIC SCREENING ORDER

## 2024-04-18 NOTE — Progress Notes (Signed)
 As per Lacie Burton NP, order was placed in portal for [employer] successfully, paperwork was uploaded, kit was taken to lab to be drawn today 12/18

## 2024-04-19 LAB — CANCER ANTIGEN 19-9: CA 19-9: 101 U/mL — ABNORMAL HIGH (ref 0–35)

## 2024-04-21 ENCOUNTER — Ambulatory Visit: Payer: Self-pay | Admitting: Nurse Practitioner

## 2024-04-24 ENCOUNTER — Ambulatory Visit (HOSPITAL_COMMUNITY)
Admission: RE | Admit: 2024-04-24 | Discharge: 2024-04-24 | Disposition: A | Discharge status: Home / Self Care | Source: Ambulatory Visit | Attending: Nurse Practitioner | Admitting: Nurse Practitioner

## 2024-04-24 DIAGNOSIS — C250 Malignant neoplasm of head of pancreas: Secondary | ICD-10-CM | POA: Diagnosis present

## 2024-04-24 MED ORDER — IOHEXOL 300 MG/ML  SOLN
100.0000 mL | Freq: Once | INTRAMUSCULAR | Status: AC | PRN
  Administered 2024-04-24: 100 mL via INTRAVENOUS

## 2024-04-30 ENCOUNTER — Encounter: Payer: Self-pay | Admitting: Nurse Practitioner

## 2024-04-30 ENCOUNTER — Inpatient Hospital Stay: Admitting: Nurse Practitioner

## 2024-04-30 DIAGNOSIS — C250 Malignant neoplasm of head of pancreas: Secondary | ICD-10-CM | POA: Diagnosis not present

## 2024-04-30 NOTE — Progress Notes (Signed)
 "     [facility]   [phone] [phone]    Patient Care Team: Dayna Motto, DO as PCP - General (Family Medicine) Kate Lonni CROME, MD as PCP - Cardiology (Cardiology) Lanny Callander, MD as Attending Physician (Hematology and Oncology) Burnette Fallow, MD as Consulting Physician (Gastroenterology) Aron Shoulders, MD as Consulting Physician (General Surgery) Daryle Heron CROME, RD as Dietitian (Nutrition)   I connected with Breanna Noble on 04/30/2024 at  9:30 AM EST by telephone visit and verified that I am speaking with the correct person using two identifiers.   I discussed the limitations, risks, security and privacy concerns of performing an evaluation and management service by telemedicine and the availability of in-person appointments. I also discussed with the patient that there may be a patient responsible charge related to this service. The patient expressed understanding and agreed to proceed.   Other persons participating in the visit and their role in the encounter: None   Patients location: Home  Providers location: [facility] Office   CHIEF COMPLAINT: Follow-up imaging results  Oncology History Overview Note  Cancer Staging Pancreatic cancer [facility]) Staging form: Exocrine Pancreas, AJCC 8th Edition - Clinical stage from 10/16/2019: Stage IIB (cT2, cN1, cM0) - Signed by Lanny Callander, MD on 10/16/2019    Pancreatic cancer (HCC)  10/03/2019 Imaging   US  Abdomen 10/03/19  IMPRESSION: Markedly dilated bile ducts. Dilated gallbladder with sludge but no gallstones.   2.9 cm mass in the uncinate process most likely pancreatic neoplasm causing biliary obstruction. Recommend MRI of the pancreas without with contrast for further evaluation. If the patient cannot have MRI, CT of the pancreas with contrast recommended.   10/07/2019 Imaging   MRI Abdomen 10/07/19  IMPRESSION: 1. There is a hypoenhancing mass of the pancreatic uncinate with abrupt  obstruction of the central common bile duct, measuring approximately 2.9 cm, consistent with pancreatic adenocarcinoma. 2. Gross intra and extrahepatic biliary ductal dilatation, common bile duct measuring 1.4 cm. Gallbladder hydrops. 3. The pancreatic duct is nondilated. 4. Pancreatic mass lies closely adjacent to the most central portions of the superior mesenteric vein and central portal vein, due to motion artifact it is difficult to clearly discern whether there is a preserved fat plane. Multiphasic contrast enhanced pancreatic protocol CT may be less motion sensitive and better delineate vascular structures for the purposes of surgical planning if necessary. 5. No evidence of lymphadenopathy or metastatic disease in the abdomen.   10/10/2019 Procedure   EUS by Dr Burnette 10/10/19  IMPRESSION - A mass was identified at junction of head/uncinate process of the pancreas. This was staged T2 N1 Mx by endosonographic criteria. Fine needle aspiration performed. - A few abnormal lymph nodes were visualized in the peripancreatic region. - There was dilation in the common bile duct which measured up to 14 mm. - Hyperechoic material consistent with sludge was visualized endosonographically in the gallbladder.   10/10/2019 Procedure   ERCP by Dr Burnette 10/10/19  IMPRESSION - The major papilla appeared normal. - One temporary stent was placed into the ventral pancreatic duct. - A biliary sphincterotomy was performed. - One covered metal stent was placed into the common bile duct.   10/10/2019 Initial Biopsy   FINAL MICROSCOPIC DIAGNOSIS: 10/10/19  A. PANCREAS, HEAD, FINE NEEDLE ASPIRATION:  - Malignant cells present, consistent with adenocarcinoma    10/16/2019 Initial Diagnosis   Pancreatic cancer (HCC)   10/16/2019 Cancer Staging   Staging form: Exocrine Pancreas, AJCC 8th Edition - Clinical  stage from 10/16/2019: Stage IIB (cT2, cN1, cM0) - Signed by Lanny Callander, MD on 10/16/2019    10/23/2019 Imaging   CT CAP W contrast  IMPRESSION: 1. Pancreatic mass with local adenopathy and potential retroperitoneal adenopathy. Given retroperitoneal lymph nodes and presence of venous collaterals PET scan may be useful for staging purposes. 2. No definite vascular involvement or signs of upper abdominal venous collaterals. Some stranding around the celiac may be due to recent biopsy. 3. Borderline enlarged RIGHT external iliac lymph node. Attention on follow-up. 4. No evidence of metastatic disease in the chest. 5. Signs of recent ERCP and stent placement.   10/25/2019 Procedure   PAC placed    11/09/2019 - 02/20/2020 Chemotherapy   Neoadjuvant FOLFIRINOX q2weeks starting 11/09/19-02/20/20 for 8 cycles   11/28/2019 Genetic Testing   Negative genetic testing on the common hereditary cancer panel, pancreatic cancer panel, preliminary evidence pancreatic cancer panel and chronic pancreatitis panel.  The Common Hereditary Gene Panel offered by [facility] includes sequencing and/or deletion duplication testing of the following 55 genes: APC*, ATM*, AXIN2, BARD1, BMPR1A, BRCA1, BRCA2, BRIP1, CASR, CDH1, CDK4, CDKN2A (p14ARF), CDKN2A (p16INK4a), CFTR*, CHEK2, CPA1, CTNNA1, CTRC, DICER1*, EPCAM*, FANCC, GREM1*, HOXB13, KIT, MEN1*, MLH1*, MSH2*, MSH3*, MSH6*, MUTYH, NBN, NF1*, NTHL1, PALB2, PALLD, PDGFRA, PMS2*, POLD1*, POLE, PRSS1*, PTEN*, RAD50, RAD51C, RAD51D, SDHA*, SDHB, SDHC*, SDHD, SMAD4, SMARCA4, SPINK1, STK11, TP53, TSC1*, TSC2, and VHL .  The report date is November 28, 2019.   01/28/2020 Imaging   CT AP w contrast  IMPRESSION: 1. No substantial interval change in exam. 2. Similar appearance of the pancreatic mass with associated main pancreatic ductal dilatation. 3. Soft tissue stranding again noted around the celiac axis, in the para-aortic retroperitoneal space and along the IMA (new in the interval). These findings are associated with similar appearance of upper normal to  borderline hepatoduodenal ligament and retroperitoneal lymphadenopathy. Close continued attention on follow-up recommended. 4. Borderline enlarged right external iliac node identified on previous study has decreased in size in the interval.   02/14/2020 PET scan   IMPRESSION: 1. The pancreatic mass has a maximum SUV of 6.1. No findings of hypermetabolic local adenopathy or distant metastatic spread. 2. Splenomegaly. 3. Inspissated barium or appendicolith within the appendix. No signs of appendiceal inflammation. 4. Scattered sigmoid colon diverticula. ADDENDUM: The original report was by Dr. Ryan Salvage. The following addendum is by Dr. Ryan Salvage:   On the CT from 01/28/2020 there were some upper normal sized/borderline retroperitoneal lymph nodes including a 0.8 cm aortocaval node on image 72 of series 9. This currently has a maximum SUV of 2.2, which is similar to blood pool activity. An indistinctly marginated lymph node just below the left renal vein has a maximum SUV of 2.4, just above blood pool levels., and measures about 1.0 cm in diameter. Lymph nodes anterior to the left psoas muscle have maximum SUV of 2.0, similar to blood pool. Overall these lymph nodes are borderline enlarged and with activity level at or just minimally above blood pool. While certainly not grossly positive, their proximity to the pancreatic head mass and borderline imaging characteristics make it difficult to exclude early nodal involvement by malignancy.   03/24/2020 Surgery   Whipple surgery, Laparoscopic Diagnostic, Appendectomy by Dr Aron    03/24/2020 Pathology Results   FINAL MICROSCOPIC DIAGNOSIS:   A. WHIPPLE PROCEDURE, WITH GALLBLADDER:  - Adenocarcinoma, moderately differentiated, spanning 1 cm.  - Tumor involves retroperitoneal peripancreatic soft tissue.  - Resection margins are negative.  - Treatment  effect present.  - Rare tumor cells in one of fourteen lymph  nodes (1/14).  - Benign gallbladder with chronic inflammation and benign lymph node.  - Bile duct with granulation tissue and reactive changes.  - Benign appendix.  - Mild reactive gastropathy.  - See oncology table.   B. LYMPH NODE, PORTAL, EXCISION:  - Three of three lymph nodes negative for carcinoma (0/3).   C. LYMPH NODE, COMMON HEPATIC ARTERY, EXCISION:  - Three of three lymph nodes negative for carcinoma (0/3).   D. APPENDIX, APPENDECTOMY:  - Benign appendix.   E. LYMPH NODE, AORTOCAVAL, EXCISION:  - One of one lymph node negative for carcinoma (0/1).    03/24/2020 Cancer Staging   Staging form: Exocrine Pancreas, AJCC 8th Edition - Pathologic stage from 03/24/2020: Stage IIB (pT1c, pN1, cM0) - Signed by Lanny Callander, MD on 04/16/2020   04/14/2020 Imaging   CT AP  IMPRESSION: 1. Interval Whipple procedure. Pancreatic stent in place. 2. Ill-defined soft tissue in the right abdominal wall extending from the subcutaneous tissues to the anterior abdominal wall musculature, but no focal fluid collection. This may represent postsurgical change, sterility indeterminate by imaging. 3. Pelvic anatomy is poorly defined, including pelvic bowel loops, adnexa, and colon. There appears to be sigmoid colonic wall thickening as well as intraluminal fluid, with additional diffuse colonic wall thickening from the transverse colon distally. Findings are suggestive of generalized colitis. Attention to this area on follow-up is recommended. 4. Small amount of non organized free fluid in the right upper quadrant. No evidence of intra-abdominal abscess. 5. Periportal edema, nonspecific. Previous biliary stent has been removed. Common bile duct is not well-defined on the current exam. 6. Mild splenomegaly. 7. Trace right pleural effusion.   05/19/2020 - 08/04/2020 Chemotherapy   Adjuvant Gemcitabine  2 weeks on 1 week off starting 05/19/20 and Xeloda  started on 05/25/2020. Xeloda  dose currently at  1500mg  in the AM and 1000mg  in the PM. Completed 08/04/20.    08/18/2020 Imaging   CT CAP  IMPRESSION: 1. Interval improvement in previously demonstrated ascites and generalized mesenteric edema. No peritoneal nodularity or focal extraluminal fluid collection identified. 2. Stable postsurgical changes from previous Whipple procedure. No evidence of local recurrence or metastatic disease. 3. Pancreatic stent remains in place without pancreatic ductal dilatation. Although there is no pneumobilia, there is no significant biliary dilatation. 4. Stable mild splenomegaly. 5. No significant findings in the chest.   12/08/2020 Imaging   CT CAP  IMPRESSION: 1. Status post Whipple pancreaticoduodenectomy. 2. There are numerous newly enlarged, matted appearing retroperitoneal lymph nodes, largest left retroperitoneal nodes measuring up to 1.9 x 1.2 cm. Findings are highly concerning for nodal metastatic disease. 3. Main pancreatic duct stent remains within the pancreatic duct but advanced several centimeters into the jejunal efferent limb when compared to prior examination. There is new, diffuse pancreatic ductal dilatation, measuring up to 6 mm. 4. No evidence of organ metastatic disease or distant metastatic disease in the chest. 5. Small volume perihepatic ascites, unchanged, suspicious for malignant ascites, although without direct evidence of omental or peritoneal metastatic disease. 6. Coronary artery disease.   12/22/2020 PET scan   IMPRESSION: No evidence of recurrent or metastatic carcinoma.      CURRENT THERAPY: Cancer surveillance  INTERVAL HISTORY Ms. Breanna Noble presents by phone to review CT.  She is feeling well except a respiratory mess she got from her sick grandchildren.  She denies fever or bodyaches, does not think it is the flu.  Otherwise she  feels well with normal energy and appetite, denies unintentional weight loss, abdominal pain/bloating, or any other new or specific  complaints.  ROS  All other systems reviewed and negative  Past Medical History:  Diagnosis Date   Cataracts, bilateral    Diverticulosis 12/07/2001   found on colonoscopy   Family history of colon cancer    Family history of pancreatic cancer    Hypothyroid 10/2007   Macular hole    pancreatic ca 08/2019   pancreatic   Pancreatic mass    PONV (postoperative nausea and vomiting)      Past Surgical History:  Procedure Laterality Date   APPENDECTOMY N/A 03/24/2020   Procedure: APPENDECTOMY;  Surgeon: Aron Shoulders, MD;  Location: [facility] OR;  Service: General;  Laterality: N/A;   BILIARY STENT PLACEMENT  10/10/2019   Procedure: BILIARY STENT PLACEMENT;  Surgeon: Burnette Fallow, MD;  Location[facility] ENDOSCOPY;  Service: Endoscopy;;   CATARACT EXTRACTION, BILATERAL     COLONOSCOPY     ERCP N/A 10/10/2019   Procedure: ENDOSCOPIC RETROGRADE CHOLANGIOPANCREATOGRAPHY (ERCP);  Surgeon: Burnette Fallow, MD;  Location: [facility] ENDOSCOPY;  Service: Endoscopy;  Laterality: N/A;   ESOPHAGOGASTRODUODENOSCOPY N/A 10/10/2019   Procedure: ESOPHAGOGASTRODUODENOSCOPY (EGD);  Surgeon: Burnette Fallow, MD;  Location: [facility] ENDOSCOPY;  Service: Endoscopy;  Laterality: N/A;   EUS N/A 10/10/2019   Procedure: UPPER ENDOSCOPIC ULTRASOUND (EUS) LINEAR;  Surgeon: Burnette Fallow, MD;  Location: [facility] ENDOSCOPY;  Service: Endoscopy;  Laterality: N/A;   EYE SURGERY     FINE NEEDLE ASPIRATION  10/10/2019   Procedure: FINE NEEDLE ASPIRATION (FNA) LINEAR;  Surgeon: Burnette Fallow, MD;  Location: [facility] ENDOSCOPY;  Service: Endoscopy;;   IR IMAGING GUIDED PORT INSERTION  10/25/2019   LAPAROSCOPY N/A 03/24/2020   Procedure: LAPAROSCOPY DIAGNOSTIC;  Surgeon: Aron Shoulders, MD;  Location: [facility] OR;  Service: General;  Laterality: N/A;   Macular hole surg     PANCREATIC STENT PLACEMENT  10/10/2019   Procedure: PANCREATIC STENT PLACEMENT;  Surgeon: Burnette Fallow, MD;  Location: [facility] ENDOSCOPY;  Service: Endoscopy;;   PORT-A-CATH REMOVAL N/A  10/18/2023   Procedure: REMOVAL PORT-A-CATH;  Surgeon: Aron Shoulders, MD;  Location: [facility];  Service: General;  Laterality: N/A;   SPHINCTEROTOMY  10/10/2019   Procedure: SPHINCTEROTOMY;  Surgeon: Burnette Fallow, MD;  Location: [facility] ENDOSCOPY;  Service: Endoscopy;;   TONSILLECTOMY     TONSILLECTOMY     WHIPPLE PROCEDURE N/A 03/24/2020   Procedure: WHIPPLE PROCEDURE;  Surgeon: Aron Shoulders, MD;  Location: [facility] OR;  Service: General;  Laterality: N/A;     Outpatient Encounter Medications as of 04/30/2024  Medication Sig   Ascorbic Acid (VITAMIN C PO) Take 1,000 mg by mouth daily.    atorvastatin  (LIPITOR) 10 MG tablet Take 1 tablet (10 mg total) by mouth daily.   Calcium  Carb-Cholecalciferol (CALCIUM  1000 + D PO) Take 1 tablet by mouth daily.    ibuprofen (ADVIL) 200 MG tablet Take 400 mg by mouth every 6 (six) hours as needed for moderate pain.   levothyroxine  (SYNTHROID , LEVOTHROID) 50 MCG tablet Take 1 tablet (50 mcg total) by mouth daily.   MAGNESIUM  CITRATE PO Take by mouth.   Multiple Vitamin (MULTIVITAMIN PO) Take 1 tablet by mouth daily.   oxyCODONE  (OXY IR/ROXICODONE ) 5 MG immediate release tablet Take 1 tablet (5 mg total) by mouth every 6 (six) hours as needed for severe pain (pain score 7-10).   vitamin B-12 (CYANOCOBALAMIN ) 1000 MCG tablet Take 1,000 mcg by mouth daily.   No facility-administered encounter  medications on file as of 04/30/2024.     There were no vitals filed for this visit. There is no height or weight on file to calculate BMI.   ECOG PERFORMANCE STATUS: 0 - Asymptomatic  PHYSICAL EXAM Patient appears well by phone.  Voice is strong, speech is clear.  Mood/affect appear normal for situation.  No cough or conversational dyspnea  CBC    Latest Ref Rng & Units 04/15/2024    2:33 PM 01/30/2024    2:35 PM 09/18/2023    2:43 PM  CBC  WBC 4.0 - 10.5 K/uL 8.1  6.2  5.5   Hemoglobin 12.0 - 15.0 g/dL 87.8  87.0  87.6   Hematocrit 36.0 - 46.0 %  37.3  39.7  36.8   Platelets 150 - 400 K/uL 189  167  166       CMP     Latest Ref Rng & Units 04/15/2024    2:33 PM 01/30/2024    2:35 PM 10/10/2023    8:06 AM  CMP  Glucose 70 - 99 mg/dL 97  80    BUN 8 - 23 mg/dL 6  8    Creatinine 9.55 - 1.00 mg/dL 9.36  9.31    Sodium 864 - 145 mmol/L 140  142    Potassium 3.5 - 5.1 mmol/L 4.0  4.1    Chloride 98 - 111 mmol/L 102  105    CO2 22 - 32 mmol/L 28  34    Calcium  8.9 - 10.3 mg/dL 9.5  9.5    Total Protein 6.5 - 8.1 g/dL 7.0  6.9  6.6   Total Bilirubin 0.0 - 1.2 mg/dL 0.4  0.5  0.4   Alkaline Phos 38 - 126 U/L 92  71  107   AST 15 - 41 U/L 31  24  30    ALT 0 - 44 U/L 8  17  26        ASSESSMENT & PLAN: 63 year old female    1. Pancreatic Cancer, Stage IIB, cT2N1M0, ypT1N1 -Diagnosed 10/2019 by EUS with ERCP by Dr. Burnette.  Pancreatic mass biopsy showed adenocarcinoma.  Endoscopic staging T2 N1 without vascular involvement. -Baseline CA 19-9 elevated at 883. -S/p 8 cycles of neoadjuvant FOLFIRINOX from 11/09/2019 - 02/20/2020, tolerated moderately well  -S/p Whipple surgery with Dr. Aron on 03/24/2020, path showed a residual 1 cm moderately differentiated adenocarcinoma involving RP peripancreatic soft tissue and rare tumor cells in 1/14 positive lymph nodes, margins negative -She completed 3 months adjuvant chemo with gemcitabine /Xeloda  05/19/20 - 08/04/20 then went on surveillance  -CA 19-9 mildly elevated in 40-50s range since 09/2020 -A CT CAP on 12/08/2020 showed new retroperitoneal lymph nodes that were negative on PET.  These have been stable/improving in the interim - Ms. Mccleary appears well, she was called back due to CA 19-9 rise from baseline 30s-50s up to 415 on 04/15/2024.  She had recently recovered from a viral illness.  Repeat CA 19-9 on 04/18/2024 down to 101, not typical behavior for malignancy. -Given that the CA 19-9 was still higher than baseline proceeded with CT CAP 04/24/24 which shows no evidence of recurrence,  stable abdominal adenopathy since 2022.   2. Genetic Testing -negative result 11/28/19   PLAN: -Recent lab and CT CAP reviewed, no evidence of recurrence -Follow-up pending Signatera result - cont q3 months -Continue surveillance -Repeat CA 19-9 in 6 weeks -lab and follow up 09/26/24 as scheduled, or sooner if needed - she knows s/sx of recurrence to  monitor for   All questions were answered. The patient knows to call the clinic with any problems, questions or concerns. No barriers to learning were detected. I spent 5 minutes counseling the patient face to face. The total time spent in the appointment was 10 minutes and more than 50% was on counseling, review of test results, and coordination of care.   Shantale Holtmeyer K Zuha Dejonge, NP 04/30/2024  "

## 2024-05-10 ENCOUNTER — Encounter: Payer: Self-pay | Admitting: Hematology

## 2024-05-31 ENCOUNTER — Encounter (HOSPITAL_COMMUNITY): Payer: Self-pay

## 2024-06-05 ENCOUNTER — Encounter: Payer: Self-pay | Admitting: Hematology

## 2024-06-12 ENCOUNTER — Inpatient Hospital Stay: Attending: Hematology

## 2024-07-24 ENCOUNTER — Inpatient Hospital Stay: Attending: Hematology

## 2024-09-26 ENCOUNTER — Other Ambulatory Visit

## 2024-09-26 ENCOUNTER — Ambulatory Visit: Admitting: Hematology
